# Patient Record
Sex: Male | Born: 1935
Health system: Southern US, Community
[De-identification: ages and names within clinical notes are randomized; demographics above are authoritative.]

## PROBLEM LIST (undated history)

## (undated) DIAGNOSIS — N400 Enlarged prostate without lower urinary tract symptoms: Secondary | ICD-10-CM

## (undated) DIAGNOSIS — I10 Essential (primary) hypertension: Secondary | ICD-10-CM

## (undated) DIAGNOSIS — Z974 Presence of external hearing-aid: Secondary | ICD-10-CM

## (undated) DIAGNOSIS — R011 Cardiac murmur, unspecified: Secondary | ICD-10-CM

## (undated) DIAGNOSIS — Z8719 Personal history of other diseases of the digestive system: Secondary | ICD-10-CM

## (undated) DIAGNOSIS — M1710 Unilateral primary osteoarthritis, unspecified knee: Secondary | ICD-10-CM

## (undated) DIAGNOSIS — E119 Type 2 diabetes mellitus without complications: Secondary | ICD-10-CM

## (undated) DIAGNOSIS — G4700 Insomnia, unspecified: Secondary | ICD-10-CM

## (undated) DIAGNOSIS — G4730 Sleep apnea, unspecified: Secondary | ICD-10-CM

## (undated) DIAGNOSIS — I639 Cerebral infarction, unspecified: Secondary | ICD-10-CM

## (undated) DIAGNOSIS — I219 Acute myocardial infarction, unspecified: Secondary | ICD-10-CM

## (undated) DIAGNOSIS — Z85828 Personal history of other malignant neoplasm of skin: Secondary | ICD-10-CM

## (undated) DIAGNOSIS — Z860100 Personal history of colon polyps, unspecified: Secondary | ICD-10-CM

## (undated) DIAGNOSIS — C4490 Unspecified malignant neoplasm of skin, unspecified: Secondary | ICD-10-CM

## (undated) DIAGNOSIS — Z972 Presence of dental prosthetic device (complete) (partial): Secondary | ICD-10-CM

## (undated) DIAGNOSIS — N390 Urinary tract infection, site not specified: Secondary | ICD-10-CM

## (undated) DIAGNOSIS — E785 Hyperlipidemia, unspecified: Secondary | ICD-10-CM

## (undated) DIAGNOSIS — L719 Rosacea, unspecified: Secondary | ICD-10-CM

## (undated) DIAGNOSIS — G459 Transient cerebral ischemic attack, unspecified: Secondary | ICD-10-CM

## (undated) DIAGNOSIS — F0390 Unspecified dementia without behavioral disturbance: Secondary | ICD-10-CM

## (undated) DIAGNOSIS — K219 Gastro-esophageal reflux disease without esophagitis: Secondary | ICD-10-CM

## (undated) DIAGNOSIS — Z8679 Personal history of other diseases of the circulatory system: Secondary | ICD-10-CM

## (undated) DIAGNOSIS — C3490 Malignant neoplasm of unspecified part of unspecified bronchus or lung: Secondary | ICD-10-CM

## (undated) DIAGNOSIS — I2510 Atherosclerotic heart disease of native coronary artery without angina pectoris: Secondary | ICD-10-CM

## (undated) HISTORY — DX: Unspecified malignant neoplasm of skin, unspecified: C44.90

## (undated) HISTORY — DX: Unilateral primary osteoarthritis, unspecified knee: M17.10

## (undated) HISTORY — DX: Malignant neoplasm of unspecified part of unspecified bronchus or lung: C34.90

## (undated) HISTORY — DX: Benign prostatic hyperplasia without lower urinary tract symptoms: N40.0

## (undated) HISTORY — DX: Personal history of other diseases of the circulatory system: Z86.79

## (undated) HISTORY — DX: Rosacea, unspecified: L71.9

## (undated) HISTORY — PX: EYE SURGERY: SHX253

## (undated) HISTORY — DX: Essential (primary) hypertension: I10

## (undated) HISTORY — DX: Urinary tract infection, site not specified: N39.0

## (undated) HISTORY — DX: Transient cerebral ischemic attack, unspecified: G45.9

## (undated) HISTORY — DX: Insomnia, unspecified: G47.00

## (undated) HISTORY — DX: Personal history of colon polyps, unspecified: Z86.0100

## (undated) HISTORY — DX: Acute myocardial infarction, unspecified: I21.9

## (undated) HISTORY — DX: Personal history of colonic polyps: Z86.010

## (undated) HISTORY — DX: Cerebral infarction, unspecified: I63.9

## (undated) HISTORY — DX: Type 2 diabetes mellitus without complications: E11.9

## (undated) HISTORY — DX: Hyperlipidemia, unspecified: E78.5

## (undated) HISTORY — PX: MOHS SURGERY: SUR867

## (undated) HISTORY — DX: Atherosclerotic heart disease of native coronary artery without angina pectoris: I25.10

## (undated) HISTORY — DX: Personal history of other malignant neoplasm of skin: Z85.828

---

## 1968-11-12 HISTORY — PX: VASECTOMY: SHX75

## 2006-08-12 HISTORY — PX: REPLACEMENT TOTAL KNEE: SUR1224

## 2010-08-12 HISTORY — PX: COLONOSCOPY: SHX174

## 2012-02-28 DIAGNOSIS — L719 Rosacea, unspecified: Secondary | ICD-10-CM | POA: Diagnosis not present

## 2012-02-28 DIAGNOSIS — L570 Actinic keratosis: Secondary | ICD-10-CM | POA: Diagnosis not present

## 2012-03-28 DIAGNOSIS — E119 Type 2 diabetes mellitus without complications: Secondary | ICD-10-CM | POA: Diagnosis not present

## 2012-03-28 DIAGNOSIS — I10 Essential (primary) hypertension: Secondary | ICD-10-CM | POA: Diagnosis not present

## 2012-03-28 DIAGNOSIS — E7800 Pure hypercholesterolemia, unspecified: Secondary | ICD-10-CM | POA: Diagnosis not present

## 2012-04-01 DIAGNOSIS — E7800 Pure hypercholesterolemia, unspecified: Secondary | ICD-10-CM | POA: Diagnosis not present

## 2012-04-01 DIAGNOSIS — E119 Type 2 diabetes mellitus without complications: Secondary | ICD-10-CM | POA: Diagnosis not present

## 2012-07-10 DIAGNOSIS — L820 Inflamed seborrheic keratosis: Secondary | ICD-10-CM | POA: Diagnosis not present

## 2012-07-10 DIAGNOSIS — L719 Rosacea, unspecified: Secondary | ICD-10-CM | POA: Diagnosis not present

## 2012-07-10 DIAGNOSIS — L570 Actinic keratosis: Secondary | ICD-10-CM | POA: Diagnosis not present

## 2012-07-10 DIAGNOSIS — L578 Other skin changes due to chronic exposure to nonionizing radiation: Secondary | ICD-10-CM | POA: Diagnosis not present

## 2012-08-21 DIAGNOSIS — L570 Actinic keratosis: Secondary | ICD-10-CM | POA: Diagnosis not present

## 2012-09-09 DIAGNOSIS — Z23 Encounter for immunization: Secondary | ICD-10-CM | POA: Diagnosis not present

## 2012-09-10 DIAGNOSIS — L570 Actinic keratosis: Secondary | ICD-10-CM | POA: Diagnosis not present

## 2012-09-26 DIAGNOSIS — E119 Type 2 diabetes mellitus without complications: Secondary | ICD-10-CM | POA: Diagnosis not present

## 2012-09-26 DIAGNOSIS — E7800 Pure hypercholesterolemia, unspecified: Secondary | ICD-10-CM | POA: Diagnosis not present

## 2012-09-26 DIAGNOSIS — I10 Essential (primary) hypertension: Secondary | ICD-10-CM | POA: Diagnosis not present

## 2012-09-26 DIAGNOSIS — H9190 Unspecified hearing loss, unspecified ear: Secondary | ICD-10-CM | POA: Diagnosis not present

## 2012-09-26 DIAGNOSIS — N401 Enlarged prostate with lower urinary tract symptoms: Secondary | ICD-10-CM | POA: Insufficient documentation

## 2012-09-29 DIAGNOSIS — E119 Type 2 diabetes mellitus without complications: Secondary | ICD-10-CM | POA: Diagnosis not present

## 2012-10-23 DIAGNOSIS — G4700 Insomnia, unspecified: Secondary | ICD-10-CM | POA: Diagnosis not present

## 2012-10-23 DIAGNOSIS — N401 Enlarged prostate with lower urinary tract symptoms: Secondary | ICD-10-CM | POA: Diagnosis not present

## 2012-10-23 DIAGNOSIS — I10 Essential (primary) hypertension: Secondary | ICD-10-CM | POA: Diagnosis not present

## 2012-10-23 DIAGNOSIS — E7800 Pure hypercholesterolemia, unspecified: Secondary | ICD-10-CM | POA: Diagnosis not present

## 2012-11-25 DIAGNOSIS — I10 Essential (primary) hypertension: Secondary | ICD-10-CM | POA: Diagnosis not present

## 2012-11-25 DIAGNOSIS — G4700 Insomnia, unspecified: Secondary | ICD-10-CM | POA: Diagnosis not present

## 2012-11-25 DIAGNOSIS — N401 Enlarged prostate with lower urinary tract symptoms: Secondary | ICD-10-CM | POA: Diagnosis not present

## 2012-11-25 DIAGNOSIS — E7800 Pure hypercholesterolemia, unspecified: Secondary | ICD-10-CM | POA: Diagnosis not present

## 2012-12-16 DIAGNOSIS — H6120 Impacted cerumen, unspecified ear: Secondary | ICD-10-CM | POA: Diagnosis not present

## 2012-12-16 DIAGNOSIS — H903 Sensorineural hearing loss, bilateral: Secondary | ICD-10-CM | POA: Diagnosis not present

## 2013-01-12 DIAGNOSIS — L578 Other skin changes due to chronic exposure to nonionizing radiation: Secondary | ICD-10-CM | POA: Diagnosis not present

## 2013-01-12 DIAGNOSIS — L570 Actinic keratosis: Secondary | ICD-10-CM | POA: Diagnosis not present

## 2013-02-20 DIAGNOSIS — I10 Essential (primary) hypertension: Secondary | ICD-10-CM | POA: Diagnosis not present

## 2013-02-20 DIAGNOSIS — E119 Type 2 diabetes mellitus without complications: Secondary | ICD-10-CM | POA: Diagnosis not present

## 2013-02-24 DIAGNOSIS — G4700 Insomnia, unspecified: Secondary | ICD-10-CM | POA: Diagnosis not present

## 2013-02-24 DIAGNOSIS — I10 Essential (primary) hypertension: Secondary | ICD-10-CM | POA: Diagnosis not present

## 2013-02-24 DIAGNOSIS — E119 Type 2 diabetes mellitus without complications: Secondary | ICD-10-CM | POA: Diagnosis not present

## 2013-02-24 DIAGNOSIS — E7800 Pure hypercholesterolemia, unspecified: Secondary | ICD-10-CM | POA: Diagnosis not present

## 2013-02-26 DIAGNOSIS — M79609 Pain in unspecified limb: Secondary | ICD-10-CM | POA: Diagnosis not present

## 2013-05-18 DIAGNOSIS — H2510 Age-related nuclear cataract, unspecified eye: Secondary | ICD-10-CM | POA: Diagnosis not present

## 2013-05-22 DIAGNOSIS — E119 Type 2 diabetes mellitus without complications: Secondary | ICD-10-CM | POA: Diagnosis not present

## 2013-05-22 DIAGNOSIS — I10 Essential (primary) hypertension: Secondary | ICD-10-CM | POA: Diagnosis not present

## 2013-05-22 LAB — COMPLETE METABOLIC PANEL WITH GFR
AST: 25 U/L
Creat: 1.14
Glucose: 94
Sodium: 139 mmol/L (ref 137–147)

## 2013-05-29 DIAGNOSIS — M62838 Other muscle spasm: Secondary | ICD-10-CM | POA: Diagnosis not present

## 2013-05-29 DIAGNOSIS — I10 Essential (primary) hypertension: Secondary | ICD-10-CM | POA: Diagnosis not present

## 2013-05-29 DIAGNOSIS — E119 Type 2 diabetes mellitus without complications: Secondary | ICD-10-CM | POA: Diagnosis not present

## 2013-05-29 DIAGNOSIS — G4700 Insomnia, unspecified: Secondary | ICD-10-CM | POA: Diagnosis not present

## 2013-07-07 ENCOUNTER — Ambulatory Visit (INDEPENDENT_AMBULATORY_CARE_PROVIDER_SITE_OTHER): Payer: Medicare Other | Admitting: Family Medicine

## 2013-07-07 ENCOUNTER — Encounter: Payer: Self-pay | Admitting: Family Medicine

## 2013-07-07 VITALS — BP 126/80 | HR 76 | Temp 97.7°F | Ht 66.75 in | Wt 182.0 lb

## 2013-07-07 DIAGNOSIS — L719 Rosacea, unspecified: Secondary | ICD-10-CM | POA: Insufficient documentation

## 2013-07-07 DIAGNOSIS — Z86010 Personal history of colonic polyps: Secondary | ICD-10-CM | POA: Insufficient documentation

## 2013-07-07 DIAGNOSIS — N400 Enlarged prostate without lower urinary tract symptoms: Secondary | ICD-10-CM | POA: Diagnosis not present

## 2013-07-07 DIAGNOSIS — E119 Type 2 diabetes mellitus without complications: Secondary | ICD-10-CM | POA: Diagnosis not present

## 2013-07-07 DIAGNOSIS — G4700 Insomnia, unspecified: Secondary | ICD-10-CM | POA: Diagnosis not present

## 2013-07-07 MED ORDER — DOXEPIN HCL 3 MG PO TABS: 3.0000 mg | ORAL_TABLET | Freq: Every evening | ORAL | Status: DC | PRN

## 2013-07-07 NOTE — Patient Instructions (Signed)
Continue medicnies as up to now. Trial of silenor for sleep.  Let me know how this does. Return in 3-4 months fasting for blood work and afterwards for [employer] visit.

## 2013-07-07 NOTE — Assessment & Plan Note (Signed)
Continue meds. 

## 2013-07-07 NOTE — Assessment & Plan Note (Signed)
Good control as of last A1c. will continue to monitor. Foot exam today. Per pt UTD eye exam. Continue metformin.

## 2013-07-07 NOTE — Assessment & Plan Note (Signed)
Last colonoscopy 2011.  Pt states was told due for f/u in 5 yrs.  Will discuss merits of colonoscopy at that time.

## 2013-07-07 NOTE — Assessment & Plan Note (Signed)
Stable off meds. Continue to monitor. 

## 2013-07-07 NOTE — Progress Notes (Signed)
Subjective:    Patient ID: Stephen [name], male    DOB: [birth year], 79 y.o.   MRN: [id number]  HPI CC: new pt to establish  Prior PCP Dr. Delfin Edis.  She moved to [place] in [place].  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  Insomnia - longstanding.  No daytime somnolence.  Prior used lunesta which helped him fall asleep.  Has tried trazodone (didn't help) and ambien (hangover effect), benadryl or melatonin.  Has bedtime routine.  Reading at night.  No TV or radio in room.  Takes nap at noon.  Interested in trial of silenor  DM - eye exam 05/2013 at [facility].  Foot exam today.  Doesn't regularly check sugars.    Lives with friend - Ardith Dark RN. 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  Past histories reviewed and updated if relevant as below. There are no active problems to display for this patient.  Past Medical History  Diagnosis Date  . History of basal cell cancer     s/p mohs  . Insomnia     treated with multiple meds in past  . Diabetes type 2, controlled   . BPH (benign prostatic hypertrophy)   . Rosacea   . History of hypertension   . Arthritis of knee   . History of colon polyps    Past Surgical History  Procedure Laterality Date  . Colonoscopy  08/2010    hyperplastic polyp, rec rpt 5 yrs  . Mohs surgery      basal cell chin/back  . Replacement total knee Left 08/2006  . Vasectomy  1970   History  Substance Use Topics  . Smoking status: Former Smoker    Quit date: 11/12/1962  . Smokeless tobacco: Never Used     Comment: Quit 1964  . Alcohol Use: Yes     Comment: 1 beer/day   Family History  Problem Relation Age of  Onset  . Cancer Father 54    prostate and colon  . CAD Brother 36    MI, smoker  . Parkinson's disease Brother   . COPD Brother   . CAD Brother     MI  . Cancer Mother     ovarian or uterine  . Stroke Sister    Allergies  Allergen Reactions  . Penicillins Hives   No current outpatient prescriptions on file prior to visit.   No current facility-administered medications on file prior to visit.    Review of Systems  Constitutional: Negative for fever, chills, activity change, appetite change, fatigue and unexpected weight change.  HENT: Negative for hearing loss and neck pain.   Eyes: Negative for visual disturbance.  Respiratory: Negative for cough, chest tightness, shortness of breath and wheezing.   Cardiovascular: Negative for chest pain, palpitations and leg swelling.  Gastrointestinal: Negative for nausea, vomiting, abdominal pain, diarrhea, constipation, blood in stool and abdominal distention.  Genitourinary: Negative for hematuria and difficulty urinating.  Musculoskeletal: Negative for myalgias and arthralgias.  Skin: Negative for rash.  Neurological: Negative for dizziness, seizures, syncope and headaches.  Hematological: Negative for adenopathy. Does not bruise/bleed easily.  Psychiatric/Behavioral: Negative for dysphoric mood. The patient is not nervous/anxious.  Objective:   Physical Exam  Nursing note and vitals reviewed. Constitutional: He is oriented to person, place, and time. He appears well-developed and well-nourished. No distress.  HENT:  Head: Normocephalic and atraumatic.  Right Ear: External ear normal.  Left Ear: External ear normal.  Nose: Nose normal.  Mouth/Throat: Oropharynx is clear and moist. No oropharyngeal exudate.  Hearing aides bilaterally  Eyes: Conjunctivae and EOM are normal. Pupils are equal, round, and reactive to light. No scleral icterus.  Neck: Normal range of motion. Neck supple. Carotid bruit is not present.   Cardiovascular: Normal rate, regular rhythm, normal heart sounds and intact distal pulses.   No murmur heard. Pulses:      Radial pulses are 2+ on the right side, and 2+ on the left side.  Pulmonary/Chest: Effort normal and breath sounds normal. No respiratory distress. He has no wheezes. He has no rales.  Musculoskeletal: Normal range of motion. He exhibits no edema.  Diabetic foot exam: Normal inspection No skin breakdown No calluses  Normal DP/PT pulses Normal sensation to light touch and monofilament Nails normal  Lymphadenopathy:    He has no cervical adenopathy.  Neurological: He is alert and oriented to person, place, and time.  CN grossly intact, station and gait intact  Skin: Skin is warm and dry. No rash noted.  Psychiatric: He has a normal mood and affect. His behavior is normal. Judgment and thought content normal.       Assessment & Plan:

## 2013-07-07 NOTE — Assessment & Plan Note (Signed)
Has tried several meds (See HPI) - ineffective. Discussed silenor - will do trial of this. Also discussed sleep hygiene measures - pt does have bedtime routine.

## 2013-07-08 ENCOUNTER — Encounter: Payer: Self-pay | Admitting: Family Medicine

## 2013-07-16 ENCOUNTER — Encounter: Payer: Self-pay | Admitting: Family Medicine

## 2013-08-14 DIAGNOSIS — L719 Rosacea, unspecified: Secondary | ICD-10-CM | POA: Diagnosis not present

## 2013-08-14 DIAGNOSIS — Z85828 Personal history of other malignant neoplasm of skin: Secondary | ICD-10-CM | POA: Diagnosis not present

## 2013-08-14 DIAGNOSIS — L570 Actinic keratosis: Secondary | ICD-10-CM | POA: Diagnosis not present

## 2013-09-10 DIAGNOSIS — Z23 Encounter for immunization: Secondary | ICD-10-CM | POA: Diagnosis not present

## 2013-09-14 ENCOUNTER — Ambulatory Visit (INDEPENDENT_AMBULATORY_CARE_PROVIDER_SITE_OTHER): Payer: Medicare Other | Admitting: Family Medicine

## 2013-09-14 ENCOUNTER — Encounter: Payer: Self-pay | Admitting: Family Medicine

## 2013-09-14 VITALS — BP 126/78 | HR 88 | Temp 98.1°F | Wt 189.8 lb

## 2013-09-14 DIAGNOSIS — G4700 Insomnia, unspecified: Secondary | ICD-10-CM | POA: Diagnosis not present

## 2013-09-14 MED ORDER — ZOLPIDEM TARTRATE 5 MG PO TABS: 5.0000 mg | ORAL_TABLET | Freq: Every evening | ORAL | Status: DC | PRN

## 2013-09-14 MED ORDER — METFORMIN HCL 500 MG PO TABS: 500.0000 mg | ORAL_TABLET | Freq: Two times a day (BID) | ORAL | Status: DC

## 2013-09-14 MED ORDER — DOXAZOSIN MESYLATE 1 MG PO TABS: 1.0000 mg | ORAL_TABLET | Freq: Every day | ORAL | Status: DC

## 2013-09-14 NOTE — Patient Instructions (Signed)
Let's do trial of lower dose ambien - if bad side effect the next morning, may even cut in 1/2.  See below for discussion on sleep hygiene.  Insomnia Insomnia is frequent trouble falling and/or staying asleep. Insomnia can be a long term problem or a short term problem. Both are common. Insomnia can be a short term problem when the wakefulness is related to a certain stress or worry. Long term insomnia is often related to ongoing stress during waking hours and/or poor sleeping habits. Overtime, sleep deprivation itself can make the problem worse. Every little thing feels more severe because you are overtired and your ability to cope is decreased. CAUSES   Stress, anxiety, and depression.  Poor sleeping habits.  Distractions such as TV in the bedroom.  Naps close to bedtime.  Engaging in emotionally charged conversations before bed.  Technical reading before sleep.  Alcohol and other sedatives. They may make the problem worse. They can hurt normal sleep patterns and normal dream activity.  Stimulants such as caffeine for several hours prior to bedtime.  Pain syndromes and shortness of breath can cause insomnia.  Exercise late at night.  Changing time zones may cause sleeping problems (jet lag). It is sometimes helpful to have someone observe your sleeping patterns. They should look for periods of not breathing during the night (sleep apnea). They should also look to see how long those periods last. If you live alone or observers are uncertain, you can also be observed at a sleep clinic where your sleep patterns will be professionally monitored. Sleep apnea requires a checkup and treatment. Give your caregivers your medical history. Give your caregivers observations your family has made about your sleep.  SYMPTOMS   Not feeling rested in the morning.  Anxiety and restlessness at bedtime.  Difficulty falling and staying asleep. TREATMENT   Your caregiver may prescribe treatment for  an underlying medical disorders. Your caregiver can give advice or help if you are using alcohol or other drugs for self-medication. Treatment of underlying problems will usually eliminate insomnia problems.  Medications can be prescribed for short time use. They are generally not recommended for lengthy use.  Over-the-counter sleep medicines are not recommended for lengthy use. They can be habit forming.  You can promote easier sleeping by making lifestyle changes such as:  Using relaxation techniques that help with breathing and reduce muscle tension.  Exercising earlier in the day.  Changing your diet and the time of your last meal. No night time snacks.  Establish a regular time to go to bed.  Counseling can help with stressful problems and worry.  Soothing music and white noise may be helpful if there are background noises you cannot remove.  Stop tedious detailed work at least one hour before bedtime. HOME CARE INSTRUCTIONS   Keep a diary. Inform your caregiver about your progress. This includes any medication side effects. See your caregiver regularly. Take note of:  Times when you are asleep.  Times when you are awake during the night.  The quality of your sleep.  How you feel the next day. This information will help your caregiver care for you.  Get out of bed if you are still awake after 15 minutes. Read or do some quiet activity. Keep the lights down. Wait until you feel sleepy and go back to bed.  Keep regular sleeping and waking hours. Avoid naps.  Exercise regularly.  Avoid distractions at bedtime. Distractions include watching television or engaging in any intense or  detailed activity like attempting to balance the household checkbook.  Develop a bedtime ritual. Keep a familiar routine of bathing, brushing your teeth, climbing into bed at the same time each night, listening to soothing music. Routines increase the success of falling to sleep faster.  Use  relaxation techniques. This can be using breathing and muscle tension release routines. It can also include visualizing peaceful scenes. You can also help control troubling or intruding thoughts by keeping your mind occupied with boring or repetitive thoughts like the old concept of counting sheep. You can make it more creative like imagining planting one beautiful flower after another in your backyard garden.  During your day, work to eliminate stress. When this is not possible use some of the previous suggestions to help reduce the anxiety that accompanies stressful situations. MAKE SURE YOU:   Understand these instructions.  Will watch your condition.  Will get help right away if you are not doing well or get worse. Document Released: 10/26/2000 Document Revised: 01/21/2012 Document Reviewed: 11/26/2007 [facility] Patient Information 2014 [place], [place].

## 2013-09-14 NOTE — Assessment & Plan Note (Signed)
Has tried and failed multiple sleeping aides including over the counter meds  Will treat with ambien - low dose to try and minimize undesirable side effects. Discussed risks of non benzo hypnotics like ambien but insomnia is very botheresome and affecting quality of life.

## 2013-09-14 NOTE — Progress Notes (Signed)
  Subjective:    Patient ID: CORIE VAVRA, male    DOB: 29-Apr-1927, 79 y.o.   MRN: [id number]  HPI CC: insomnia  Insomnia - longstanding. No daytime somnolence. Has bedtime routine. Reading at night. No TV or radio in room. Takes nap at noon. Has calm, quiet dark environment to sleep.  Wakes up 2x/night to use bathroom.  Takes him about 1 hour to fall asleep.  Prior used lunesta which helped him fall asleep. Has tried trazodone (didn't help) and ambien (hangover effect), benadryl or melatonin. Last visit prescribed silenor at 3mg  - took 1 month trial but not effective.  Only thing that helped was [place], then [place].  Always needs something to help him sleep.  Past Medical History  Diagnosis Date  . History of basal cell cancer     s/p mohs  . Insomnia     treated with multiple meds in past  . Diabetes type 2, controlled   . BPH (benign prostatic hypertrophy)     w/ nocturia  . Rosacea   . Arthritis of knee   . History of colon polyps   . HLD (hyperlipidemia)     diet controlled in past  . History of hypertension      Review of Systems Per HPI    Objective:   Physical Exam WDWN elderly CM    Assessment & Plan:

## 2013-11-12 DIAGNOSIS — I219 Acute myocardial infarction, unspecified: Secondary | ICD-10-CM

## 2013-11-12 HISTORY — DX: Acute myocardial infarction, unspecified: I21.9

## 2013-11-14 ENCOUNTER — Other Ambulatory Visit: Payer: Self-pay | Admitting: Family Medicine

## 2013-11-14 DIAGNOSIS — E119 Type 2 diabetes mellitus without complications: Secondary | ICD-10-CM

## 2013-11-20 ENCOUNTER — Other Ambulatory Visit (INDEPENDENT_AMBULATORY_CARE_PROVIDER_SITE_OTHER): Payer: Medicare Other

## 2013-11-20 DIAGNOSIS — E119 Type 2 diabetes mellitus without complications: Secondary | ICD-10-CM

## 2013-11-20 LAB — LIPID PANEL
CHOLESTEROL: 198 mg/dL (ref 0–200)
HDL: 50.8 mg/dL (ref >39.00)
LDL CALC: 109 mg/dL — AB (ref 0–99)
Total CHOL/HDL Ratio: 4
Triglycerides: 189 mg/dL — ABNORMAL HIGH (ref 0.0–149.0)
VLDL: 37.8 mg/dL (ref 0.0–40.0)

## 2013-11-20 LAB — BASIC METABOLIC PANEL
BUN: 15 mg/dL (ref 6–23)
CHLORIDE: 105 meq/L (ref 96–112)
CO2: 30 mEq/L (ref 19–32)
Calcium: 9.5 mg/dL (ref 8.4–10.5)
Creatinine, Ser: 1.2 mg/dL (ref 0.4–1.5)
GFR: 59.01 mL/min — AB (ref >60.00)
GLUCOSE: 111 mg/dL — AB (ref 70–99)
POTASSIUM: 5.1 meq/L (ref 3.5–5.1)
SODIUM: 140 meq/L (ref 135–145)

## 2013-11-20 LAB — MICROALBUMIN / CREATININE URINE RATIO
Creatinine,U: 105.7 mg/dL
MICROALB UR: 0.9 mg/dL (ref 0.0–1.9)
Microalb Creat Ratio: 0.9 mg/g (ref 0.0–30.0)

## 2013-11-20 LAB — HEMOGLOBIN A1C: HEMOGLOBIN A1C: 6.3 % (ref 4.6–6.5)

## 2013-11-25 ENCOUNTER — Encounter: Payer: Federal, State, Local not specified - PPO | Admitting: Family Medicine

## 2013-11-27 ENCOUNTER — Ambulatory Visit (INDEPENDENT_AMBULATORY_CARE_PROVIDER_SITE_OTHER): Payer: Medicare Other | Admitting: Family Medicine

## 2013-11-27 ENCOUNTER — Encounter: Payer: Self-pay | Admitting: Family Medicine

## 2013-11-27 VITALS — BP 122/78 | HR 80 | Temp 97.6°F | Ht 66.75 in | Wt 188.5 lb

## 2013-11-27 DIAGNOSIS — E119 Type 2 diabetes mellitus without complications: Secondary | ICD-10-CM

## 2013-11-27 DIAGNOSIS — E785 Hyperlipidemia, unspecified: Secondary | ICD-10-CM

## 2013-11-27 DIAGNOSIS — G4700 Insomnia, unspecified: Secondary | ICD-10-CM

## 2013-11-27 DIAGNOSIS — Z0000 Encounter for general adult medical examination without abnormal findings: Secondary | ICD-10-CM | POA: Diagnosis not present

## 2013-11-27 DIAGNOSIS — Z86010 Personal history of colonic polyps: Secondary | ICD-10-CM

## 2013-11-27 MED ORDER — ESZOPICLONE 1 MG PO TABS: 1.0000 mg | ORAL_TABLET | Freq: Every evening | ORAL | Status: DC | PRN

## 2013-11-27 NOTE — Assessment & Plan Note (Signed)
Reviewed goal LDL <100 with DM hx. Will start OTC RYR 600mg  daily.  Discussed watching out for myalgias.

## 2013-11-27 NOTE — Patient Instructions (Addendum)
Try lunesta trial for 1 month. If no effect after a few days, try increase to 2 pills nightly. For cholesterol - try red yeast rice (over the counter supplement to help cholesterol levels) Good to see you today, call us with questions Return as needed or in 1 year for next wellness exam.

## 2013-11-27 NOTE — Progress Notes (Signed)
Pre-visit discussion using our clinic review tool. No additional management support is needed unless otherwise documented below in the visit note.  

## 2013-11-27 NOTE — Assessment & Plan Note (Signed)
See prior notes for details.  Did not notice effect with 5mg  ambien.  Will start with trial of lunesta at 1mg , but prior was prescribed 2mg  with some effect. Pt aware of risks/benefits of non benzo hypnotics.

## 2013-11-27 NOTE — Addendum Note (Signed)
Addended by: Ria Bush on: 11/27/2013 09:07 AM   Modules accepted: Level of Service

## 2013-11-27 NOTE — Assessment & Plan Note (Signed)
I have personally reviewed the Medicare Annual Wellness questionnaire and have noted 1. The patient's medical and social history 2. Their use of alcohol, tobacco or illicit drugs 3. Their current medications and supplements 4. The patient's functional ability including ADL's, fall risks, home safety risks and hearing or visual impairment. 5. Diet and physical activity 6. Evidence for depression or mood disorders The patients weight, height, BMI have been recorded in the chart.  Hearing and vision has been addressed. I have made referrals, counseling and provided education to the patient based review of the above and I have provided the pt with a written personalized care plan for preventive services. See scanned questionairre.  Reviewed preventative protocols and updated unless pt declined.

## 2013-11-27 NOTE — Assessment & Plan Note (Signed)
Hyperplastic.  Likely does not need rpt - will discuss next year.

## 2013-11-27 NOTE — Assessment & Plan Note (Signed)
Chronic, stable. Continue med. 

## 2013-11-27 NOTE — Progress Notes (Signed)
Subjective:    Patient ID: Stephen [name], male    DOB: [birth year], 80 y.o.   MRN: [id number]  HPI CC: medicare wellness  Continued trouble with insomnia. Restless sleeping.  No daytime naps.  No daytime sleepiness.  No significant caffeine use during the day.  Lorrin Mais is not really helping - see prior notes for details.  May be interested in lunesta trial again as this helped in the past.  Denies adverse events when on these meds in the past.  DM - regularly does not check sugars.  Compliant with antihyperglycemic regimen which includes: metformin 500mg  twice daily.  Denies low sugars or hypoglycemic symptoms.  Denies paresthesias. Last diabetic eye exam 05/2013.  Pneumovax: 2012.     Lives with friend - Osvaldo Angst RN.  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  Deferred hearing screen 2/2 hearing aide use. No falls or anhedonia, depression, sadness.  Preventative:  Colonoscopy done 2011 would be due for rpt 2016 - will discuss next year [facility]) Prostate - aged out Flu 08/2013 Td 05/2008  Pneumvax 2012 zostavax 05/2008   Medications and allergies reviewed and updated in chart.  Past histories reviewed and updated if relevant as below. Patient Active Problem List   Diagnosis Date Noted  . Insomnia   . Diabetes type 2, controlled   . BPH (benign prostatic hypertrophy)   . Rosacea   . History of colon polyps    Past Medical History  Diagnosis Date  . History of basal cell cancer     s/p mohs  . Insomnia     treated with multiple meds in past  . Diabetes type 2, controlled   . BPH (benign prostatic hypertrophy)     w/ nocturia  . Rosacea   . Arthritis of knee   . History of colon polyps   . HLD (hyperlipidemia)     diet controlled in past  . History of hypertension    Past Surgical  History  Procedure Laterality Date  . Colonoscopy  08/2010    hyperplastic polyp, rec rpt 5 yrs  . Mohs surgery      basal cell chin/back  . Replacement total knee Left 08/2006  . Vasectomy  1970   History  Substance Use Topics  . Smoking status: Former Smoker    Quit date: 11/12/1962  . Smokeless tobacco: Never Used     Comment: Quit 1964  . Alcohol Use: Yes     Comment: 1 beer/day   Family History  Problem Relation Age of Onset  . Cancer Father 39    prostate and colon  . CAD Brother 38    MI, smoker  . Parkinson's disease Brother   . COPD Brother   . CAD Brother     MI  . Cancer Mother     ovarian or uterine  . Stroke Sister    Allergies  Allergen Reactions  . Penicillins Hives   Current Outpatient Prescriptions on File Prior to Visit  Medication Sig Dispense Refill  . Clindamycin Phos-Benzoyl Perox (ACANYA) gel Apply topically once a day      . doxazosin (CARDURA) 1 MG tablet Take 1 tablet (1 mg total) by mouth at bedtime.  90 tablet  3  . metFORMIN (GLUCOPHAGE) 500 MG tablet Take 1 tablet (500 mg  total) by mouth 2 (two) times daily with a meal.  180 tablet  3  . metroNIDAZOLE (METROGEL) 1 % gel Apply topically daily.      . Multiple Vitamin (MULTIVITAMIN) tablet Take 1 tablet by mouth daily.      . Psyllium (METAMUCIL PO) Take by mouth every morning      . zolpidem (AMBIEN) 5 MG tablet Take 1 tablet (5 mg total) by mouth at bedtime as needed for sleep.  30 tablet  1   No current facility-administered medications on file prior to visit.     Review of Systems  Constitutional: Negative for fever, chills, activity change, appetite change, fatigue and unexpected weight change.  HENT: Negative for hearing loss.   Eyes: Negative for visual disturbance.  Respiratory: Negative for cough, chest tightness, shortness of breath and wheezing.   Cardiovascular: Negative for chest pain, palpitations and leg swelling.  Gastrointestinal: Negative for nausea, vomiting,  abdominal pain, diarrhea, constipation, blood in stool and abdominal distention.  Genitourinary: Negative for hematuria and difficulty urinating.  Musculoskeletal: Negative for arthralgias, myalgias and neck pain.  Skin: Negative for rash.  Neurological: Negative for dizziness, seizures, syncope and headaches.  Hematological: Negative for adenopathy. Does not bruise/bleed easily.  Psychiatric/Behavioral: Negative for dysphoric mood. The patient is not nervous/anxious.        Objective:   Physical Exam  Nursing note and vitals reviewed. Constitutional: He is oriented to person, place, and time. He appears well-developed and well-nourished. No distress.  HENT:  Head: Normocephalic and atraumatic.  Right Ear: External ear normal.  Left Ear: External ear normal.  Nose: Nose normal.  Mouth/Throat: Uvula is midline, oropharynx is clear and moist and mucous membranes are normal. No oropharyngeal exudate, posterior oropharyngeal edema, posterior oropharyngeal erythema or tonsillar abscesses.  Hearing aides in place  Eyes: Conjunctivae and EOM are normal. Pupils are equal, round, and reactive to light. No scleral icterus.  Neck: Normal range of motion. Neck supple. Carotid bruit is not present.  Cardiovascular: Normal rate, regular rhythm, normal heart sounds and intact distal pulses.   No murmur heard. Pulses:      Radial pulses are 2+ on the right side, and 2+ on the left side.  Pulmonary/Chest: Effort normal and breath sounds normal. No respiratory distress. He has no wheezes. He has no rales.  Abdominal: Soft. Bowel sounds are normal. He exhibits no distension and no mass. There is no tenderness. There is no rebound and no guarding.  Musculoskeletal: Normal range of motion. He exhibits no edema.  Lymphadenopathy:    He has no cervical adenopathy.  Neurological: He is alert and oriented to person, place, and time.  CN grossly intact, station and gait intact  Skin: Skin is warm and dry. No  rash noted.  Psychiatric: He has a normal mood and affect. His behavior is normal. Judgment and thought content normal.       Assessment & Plan:

## 2013-11-30 ENCOUNTER — Telehealth: Payer: Self-pay

## 2013-11-30 NOTE — Telephone Encounter (Signed)
Relevant patient education assigned to patient using Emmi. ° °

## 2013-12-28 ENCOUNTER — Other Ambulatory Visit: Payer: Self-pay | Admitting: Family Medicine

## 2013-12-28 NOTE — Telephone Encounter (Signed)
Ok to refill 

## 2013-12-30 NOTE — Telephone Encounter (Signed)
Rx called in as directed.   

## 2013-12-30 NOTE — Telephone Encounter (Signed)
plz phone in. 

## 2014-01-26 ENCOUNTER — Other Ambulatory Visit: Payer: Self-pay | Admitting: Family Medicine

## 2014-01-26 NOTE — Telephone Encounter (Signed)
plz phne in.

## 2014-01-26 NOTE — Telephone Encounter (Signed)
Rx called in as directed.   

## 2014-02-19 DIAGNOSIS — L719 Rosacea, unspecified: Secondary | ICD-10-CM | POA: Diagnosis not present

## 2014-02-19 DIAGNOSIS — L570 Actinic keratosis: Secondary | ICD-10-CM | POA: Diagnosis not present

## 2014-02-23 ENCOUNTER — Encounter: Payer: Self-pay | Admitting: Family Medicine

## 2014-02-23 ENCOUNTER — Ambulatory Visit (INDEPENDENT_AMBULATORY_CARE_PROVIDER_SITE_OTHER): Payer: Medicare Other | Admitting: Family Medicine

## 2014-02-23 VITALS — BP 128/68 | HR 76 | Temp 97.5°F | Wt 192.8 lb

## 2014-02-23 DIAGNOSIS — N400 Enlarged prostate without lower urinary tract symptoms: Secondary | ICD-10-CM | POA: Diagnosis not present

## 2014-02-23 DIAGNOSIS — G4700 Insomnia, unspecified: Secondary | ICD-10-CM

## 2014-02-23 DIAGNOSIS — S335XXA Sprain of ligaments of lumbar spine, initial encounter: Secondary | ICD-10-CM

## 2014-02-23 DIAGNOSIS — S39012A Strain of muscle, fascia and tendon of lower back, initial encounter: Secondary | ICD-10-CM | POA: Insufficient documentation

## 2014-02-23 MED ORDER — ZOLPIDEM TARTRATE ER 6.25 MG PO TBCR: 6.2500 mg | EXTENDED_RELEASE_TABLET | Freq: Every evening | ORAL | Status: DC | PRN

## 2014-02-23 NOTE — Patient Instructions (Addendum)
I think you have lumbar strain - should get better with time.  Continue cyclobenzaprine nightly up to twice daily if needed.  Pass by Marion's office to scheule physical therapy referral. Let's try working on night time urination to improve insomnia.  Try finasteride pill once daily along with cardura.  Trial of this for next 1-2 months to see if any improvement in night time awakenings. May try ambien extended release instead of lunesta - but don't mix with flexeril (cyclobenzaprine). Good to see you today, update me with effect of above after 1-2 months, sooner if needed.

## 2014-02-23 NOTE — Progress Notes (Signed)
BP 128/68  Pulse 76  Temp(Src) 97.5 F (36.4 C) (Oral)  Wt 192 lb 12 oz (87.431 kg)   CC: insomnia, back pain  Subjective:    Patient ID: Stephen [name], male    DOB: [birth year], 80 y.o.   MRN: [id number]  HPI: Stephen [name] is a 80 y.o. male presenting on 02/23/2014 for Back Pain and Insomnia   Recent trip to [place] - lots of airplane rides and bus rides - great trip.  initially some pedal edema which has since improved.  Also also noticed persistent R sided lower back pain since he returned.   Denies fevers/chills, radiculopathy numbness or weakness down legs, bowel/bladder accidents.  Denies inciting trauma/injury or falls.  No h/o back issues in past.  Has been back for last 2 weeks. Has been taking flexeril for back and for sleep which helps.  Finds hot shower also helps lower back.  Continued longstanding trouble with insomnia - sleep maintenance. Restless sleeping. No daytime naps. No daytime sleepiness. No significant caffeine use during the day. Lorrin Mais is not really helping - see prior notes for details. Last visit we restarted lunesta as this helped in the past. Denies adverse events when on these nonbenzo hypnotics in the past.  Johnnye Sima has lost effectiveness.    Has bedtime routine. Reading at night. No TV or radio in room. Takes nap at noon. Has calm, quiet dark environment to sleep. Wakes up 2x/night to use bathroom - on cardura for BPH. Takes him about 1 hour to fall asleep.  Notes increased nocturia over last few months.  Has tried trazodone (didn't help) and ambien (hangover effect), benadryl and melatonin. Has tried silenor at 3mg  - took 1 month trial but not effective.  Relevant past medical, surgical, family and social history reviewed and updated as indicated.  Allergies and medications reviewed and updated. Current Outpatient Prescriptions on File Prior to Visit  Medication Sig  . Clindamycin Phos-Benzoyl Perox (ACANYA) gel Apply topically once a day  .  doxazosin (CARDURA) 1 MG tablet Take 1 tablet (1 mg total) by mouth at bedtime.  . metFORMIN (GLUCOPHAGE) 500 MG tablet Take 1 tablet (500 mg total) by mouth 2 (two) times daily with a meal.  . metroNIDAZOLE (METROGEL) 1 % gel Apply topically daily.  . Multiple Vitamin (MULTIVITAMIN) tablet Take 1 tablet by mouth daily.  . Psyllium (METAMUCIL PO) Take by mouth every morning  . Red Yeast Rice 600 MG CAPS Take 1 capsule by mouth daily.   No current facility-administered medications on file prior to visit.    Review of Systems Per HPI unless specifically indicated above    Objective:    BP 128/68  Pulse 76  Temp(Src) 97.5 F (36.4 C) (Oral)  Wt 192 lb 12 oz (87.431 kg)  Physical Exam  Nursing note and vitals reviewed. Constitutional: He appears well-developed and well-nourished. No distress.  Musculoskeletal: He exhibits no edema.  No pain midline spine + tender/tightness paraspinous mm tenderness at lumbar right side Neg SLR bilaterally. No pain with int/ext rotation at hip. Slight swelling superficial medial leg vein on left, mild tenderness       Assessment & Plan:   Problem List Items Addressed This Visit   Insomnia     Trial ambien CR given main concern is sleep maintenance insomnia. Will also work on BPH as possible cause of insomnia.    BPH (benign prostatic hypertrophy)     Possibly contributing to insomnia in form of nocturia causing  awakenings. Will continue cardura, start finasteride.  Discussed mechanism of action.    Lumbar strain - Primary     No red flags - anticipate strain. Treat with continued flexeril 5mg  which pt tolerates well and refer to PT at [facility]. Pt agrees with plan.    Relevant Orders      Ambulatory referral to Physical Therapy       Follow up plan: Return if symptoms worsen or fail to improve.

## 2014-02-23 NOTE — Assessment & Plan Note (Signed)
Trial ambien CR given main concern is sleep maintenance insomnia. Will also work on BPH as possible cause of insomnia.

## 2014-02-23 NOTE — Assessment & Plan Note (Signed)
Possibly contributing to insomnia in form of nocturia causing awakenings. Will continue cardura, start finasteride.  Discussed mechanism of action.

## 2014-02-23 NOTE — Progress Notes (Signed)
Pre visit review using our clinic review tool, if applicable. No additional management support is needed unless otherwise documented below in the visit note. 

## 2014-02-23 NOTE — Assessment & Plan Note (Signed)
No red flags - anticipate strain. Treat with continued flexeril 5mg  which pt tolerates well and refer to PT at [facility]. Pt agrees with plan.

## 2014-02-24 ENCOUNTER — Telehealth: Payer: Self-pay

## 2014-02-24 MED ORDER — FINASTERIDE 5 MG PO TABS: 5.0000 mg | ORAL_TABLET | Freq: Every day | ORAL | Status: DC

## 2014-02-24 NOTE — Telephone Encounter (Signed)
plz notify this was sent in. 

## 2014-02-24 NOTE — Telephone Encounter (Signed)
Message left advising patient.  

## 2014-02-24 NOTE — Telephone Encounter (Signed)
Pt was seen on 02/23/14 and was to start Finasteride. Pt said med not at pharmacy. Pt request Finasteride sent to [facility]. Pt request cb when sent to pharmacy.

## 2014-02-26 DIAGNOSIS — S335XXA Sprain of ligaments of lumbar spine, initial encounter: Secondary | ICD-10-CM | POA: Diagnosis not present

## 2014-02-26 DIAGNOSIS — M6281 Muscle weakness (generalized): Secondary | ICD-10-CM | POA: Diagnosis not present

## 2014-03-02 ENCOUNTER — Telehealth: Payer: Self-pay | Admitting: *Deleted

## 2014-03-02 DIAGNOSIS — M6281 Muscle weakness (generalized): Secondary | ICD-10-CM | POA: Diagnosis not present

## 2014-03-02 DIAGNOSIS — S335XXA Sprain of ligaments of lumbar spine, initial encounter: Secondary | ICD-10-CM | POA: Diagnosis not present

## 2014-03-02 NOTE — Telephone Encounter (Signed)
Signed in Dr. Synthia Innocent absence.

## 2014-03-02 NOTE — Telephone Encounter (Signed)
Paperwork faxed to [facility].

## 2014-03-02 NOTE — Telephone Encounter (Signed)
PT orders for patient in your IN box for signature. If they can be stamped with his stamp, I'll take care of it. He ordered PT at patient's last OV, but i wasn't sure if the forms could be stamped or not. Just review and let me know. Please return to me. Thanks!

## 2014-03-04 DIAGNOSIS — S335XXA Sprain of ligaments of lumbar spine, initial encounter: Secondary | ICD-10-CM | POA: Diagnosis not present

## 2014-03-04 DIAGNOSIS — M6281 Muscle weakness (generalized): Secondary | ICD-10-CM | POA: Diagnosis not present

## 2014-03-05 DIAGNOSIS — S335XXA Sprain of ligaments of lumbar spine, initial encounter: Secondary | ICD-10-CM | POA: Diagnosis not present

## 2014-03-05 DIAGNOSIS — M6281 Muscle weakness (generalized): Secondary | ICD-10-CM | POA: Diagnosis not present

## 2014-03-08 DIAGNOSIS — S335XXA Sprain of ligaments of lumbar spine, initial encounter: Secondary | ICD-10-CM | POA: Diagnosis not present

## 2014-03-08 DIAGNOSIS — M6281 Muscle weakness (generalized): Secondary | ICD-10-CM | POA: Diagnosis not present

## 2014-03-09 DIAGNOSIS — M6281 Muscle weakness (generalized): Secondary | ICD-10-CM | POA: Diagnosis not present

## 2014-03-09 DIAGNOSIS — S335XXA Sprain of ligaments of lumbar spine, initial encounter: Secondary | ICD-10-CM | POA: Diagnosis not present

## 2014-03-10 DIAGNOSIS — S335XXA Sprain of ligaments of lumbar spine, initial encounter: Secondary | ICD-10-CM | POA: Diagnosis not present

## 2014-03-10 DIAGNOSIS — M6281 Muscle weakness (generalized): Secondary | ICD-10-CM | POA: Diagnosis not present

## 2014-03-12 DIAGNOSIS — I2510 Atherosclerotic heart disease of native coronary artery without angina pectoris: Secondary | ICD-10-CM | POA: Insufficient documentation

## 2014-03-12 HISTORY — PX: CARDIAC CATHETERIZATION: SHX172

## 2014-03-12 HISTORY — DX: Atherosclerotic heart disease of native coronary artery without angina pectoris: I25.10

## 2014-03-18 ENCOUNTER — Encounter: Payer: Self-pay | Admitting: Family Medicine

## 2014-03-22 ENCOUNTER — Other Ambulatory Visit: Payer: Self-pay | Admitting: Family Medicine

## 2014-03-22 NOTE — Telephone Encounter (Signed)
plz phone in. 

## 2014-03-22 NOTE — Telephone Encounter (Signed)
Rx called in as directed.   

## 2014-03-23 ENCOUNTER — Emergency Department: Payer: Self-pay | Admitting: Emergency Medicine

## 2014-03-23 DIAGNOSIS — I2119 ST elevation (STEMI) myocardial infarction involving other coronary artery of inferior wall: Secondary | ICD-10-CM | POA: Insufficient documentation

## 2014-03-23 DIAGNOSIS — Z87891 Personal history of nicotine dependence: Secondary | ICD-10-CM | POA: Diagnosis not present

## 2014-03-23 DIAGNOSIS — I2582 Chronic total occlusion of coronary artery: Secondary | ICD-10-CM | POA: Diagnosis not present

## 2014-03-23 DIAGNOSIS — Z880 Allergy status to penicillin: Secondary | ICD-10-CM | POA: Diagnosis not present

## 2014-03-23 DIAGNOSIS — E119 Type 2 diabetes mellitus without complications: Secondary | ICD-10-CM | POA: Diagnosis present

## 2014-03-23 DIAGNOSIS — N401 Enlarged prostate with lower urinary tract symptoms: Secondary | ICD-10-CM | POA: Diagnosis present

## 2014-03-23 DIAGNOSIS — R338 Other retention of urine: Secondary | ICD-10-CM | POA: Diagnosis present

## 2014-03-23 DIAGNOSIS — E7800 Pure hypercholesterolemia, unspecified: Secondary | ICD-10-CM | POA: Diagnosis present

## 2014-03-23 DIAGNOSIS — Z85828 Personal history of other malignant neoplasm of skin: Secondary | ICD-10-CM | POA: Diagnosis not present

## 2014-03-23 DIAGNOSIS — I10 Essential (primary) hypertension: Secondary | ICD-10-CM | POA: Diagnosis not present

## 2014-03-23 DIAGNOSIS — G4700 Insomnia, unspecified: Secondary | ICD-10-CM | POA: Diagnosis present

## 2014-03-23 DIAGNOSIS — I2510 Atherosclerotic heart disease of native coronary artery without angina pectoris: Secondary | ICD-10-CM | POA: Diagnosis not present

## 2014-03-23 DIAGNOSIS — R351 Nocturia: Secondary | ICD-10-CM | POA: Diagnosis present

## 2014-03-23 DIAGNOSIS — I219 Acute myocardial infarction, unspecified: Secondary | ICD-10-CM | POA: Diagnosis not present

## 2014-03-23 LAB — CBC WITH DIFFERENTIAL/PLATELET
BASOS PCT: 0.4 %
Basophil #: 0 10*3/uL (ref 0.0–0.1)
Eosinophil #: 0 10*3/uL (ref 0.0–0.7)
Eosinophil %: 0.2 %
HCT: 47.3 % (ref 40.0–52.0)
HGB: 15.5 g/dL (ref 13.0–18.0)
Lymphocyte #: 1.2 10*3/uL (ref 1.0–3.6)
Lymphocyte %: 10.1 %
MCH: 32.6 pg (ref 26.0–34.0)
MCHC: 32.8 g/dL (ref 32.0–36.0)
MCV: 99 fL (ref 80–100)
MONOS PCT: 4.5 %
Monocyte #: 0.6 x10 3/mm (ref 0.2–1.0)
Neutrophil #: 10.3 10*3/uL — ABNORMAL HIGH (ref 1.4–6.5)
Neutrophil %: 84.8 %
Platelet: 261 10*3/uL (ref 150–440)
RBC: 4.76 10*6/uL (ref 4.40–5.90)
RDW: 12.8 % (ref 11.5–14.5)
WBC: 12.1 10*3/uL — AB (ref 3.8–10.6)

## 2014-03-23 LAB — APTT: Activated PTT: 27.7 secs (ref 23.6–35.9)

## 2014-03-23 LAB — COMPREHENSIVE METABOLIC PANEL
ALBUMIN: 4.1 g/dL (ref 3.4–5.0)
AST: 45 U/L — AB (ref 15–37)
Alkaline Phosphatase: 79 U/L
Anion Gap: 4 — ABNORMAL LOW (ref 7–16)
BILIRUBIN TOTAL: 0.4 mg/dL (ref 0.2–1.0)
BUN: 22 mg/dL — AB (ref 7–18)
CHLORIDE: 105 mmol/L (ref 98–107)
CREATININE: 1.3 mg/dL (ref 0.60–1.30)
Calcium, Total: 9.2 mg/dL (ref 8.5–10.1)
Co2: 29 mmol/L (ref 21–32)
EGFR (Non-African Amer.): 50 — ABNORMAL LOW
GFR CALC AF AMER: 58 — AB
Glucose: 128 mg/dL — ABNORMAL HIGH (ref 65–99)
OSMOLALITY: 281 (ref 275–301)
Potassium: 4.3 mmol/L (ref 3.5–5.1)
SGPT (ALT): 27 U/L (ref 12–78)
Sodium: 138 mmol/L (ref 136–145)
Total Protein: 7.6 g/dL (ref 6.4–8.2)

## 2014-03-23 LAB — PROTIME-INR
INR: 1
PROTHROMBIN TIME: 12.7 s (ref 11.5–14.7)

## 2014-03-23 LAB — MAGNESIUM: Magnesium: 2.2 mg/dL

## 2014-03-23 LAB — CK TOTAL AND CKMB (NOT AT ARMC)
CK, Total: 212 U/L
CK-MB: 10.5 ng/mL — ABNORMAL HIGH (ref 0.5–3.6)

## 2014-03-23 LAB — LIPASE, BLOOD: Lipase: 114 U/L (ref 73–393)

## 2014-03-23 LAB — TROPONIN I: Troponin-I: 0.59 ng/mL — ABNORMAL HIGH

## 2014-03-29 ENCOUNTER — Telehealth: Payer: Self-pay | Admitting: Family Medicine

## 2014-03-29 NOTE — Telephone Encounter (Signed)
received D/C summary from [facility] for admission 5/12-15/2015 for STEMI of inferionrw all.  Can we call for transitional care phone call and schedule hosp f/u visit in 1-2 wks?  Thanks.  Actually looks like pt already has appt scheduled for May 21st.

## 2014-03-30 ENCOUNTER — Telehealth: Payer: Self-pay | Admitting: Family Medicine

## 2014-03-30 NOTE — Telephone Encounter (Signed)
Attempted transitional care call.  The person who answered the phone identified herself as pt's significant other and said that I could speak to her regarding pt.  When I asked her to wait a moment while I checked to see if she was on pt's DPR, she told me she was too busy and hung up the phone.

## 2014-03-30 NOTE — Telephone Encounter (Signed)
See phone note

## 2014-03-31 ENCOUNTER — Ambulatory Visit (INDEPENDENT_AMBULATORY_CARE_PROVIDER_SITE_OTHER): Payer: Medicare Other | Admitting: Family Medicine

## 2014-03-31 ENCOUNTER — Encounter: Payer: Self-pay | Admitting: Family Medicine

## 2014-03-31 VITALS — BP 112/78 | HR 72 | Temp 97.4°F | Wt 185.5 lb

## 2014-03-31 DIAGNOSIS — N400 Enlarged prostate without lower urinary tract symptoms: Secondary | ICD-10-CM

## 2014-03-31 DIAGNOSIS — R300 Dysuria: Secondary | ICD-10-CM

## 2014-03-31 DIAGNOSIS — N390 Urinary tract infection, site not specified: Secondary | ICD-10-CM | POA: Insufficient documentation

## 2014-03-31 DIAGNOSIS — I2510 Atherosclerotic heart disease of native coronary artery without angina pectoris: Secondary | ICD-10-CM

## 2014-03-31 DIAGNOSIS — E785 Hyperlipidemia, unspecified: Secondary | ICD-10-CM | POA: Diagnosis not present

## 2014-03-31 DIAGNOSIS — K5900 Constipation, unspecified: Secondary | ICD-10-CM

## 2014-03-31 LAB — POCT URINALYSIS DIPSTICK
BILIRUBIN UA: NEGATIVE
Glucose, UA: NEGATIVE
KETONES UA: NEGATIVE
Nitrite, UA: NEGATIVE
PROTEIN UA: NEGATIVE
Spec Grav, UA: <=1.005
Urobilinogen, UA: 0.2
pH, UA: 6.5

## 2014-03-31 MED ORDER — CIPROFLOXACIN HCL 250 MG PO TABS: 250.0000 mg | ORAL_TABLET | Freq: Two times a day (BID) | ORAL | Status: DC

## 2014-03-31 NOTE — Assessment & Plan Note (Signed)
Was on cardura - now on finasteride.  Anticipate sxs endorsed today due to UTI and not obstructive BPH. Will continue finasteride and see how he does after he completes UTI treatment. Hold cardura for now.

## 2014-03-31 NOTE — Progress Notes (Signed)
BP 112/78  Pulse 72  Temp(Src) 97.4 F (36.3 C) (Oral)  Wt 185 lb 8 oz (84.142 kg)   CC: hosp f/u  Subjective:    Patient ID: Stephen [name], male    DOB: Feb 01, 1927, 80 y.o.   MRN: [id number]  HPI: Stephen [name] is a 80 y.o. male presenting on 03/31/2014 for Follow-up and Urinary Tract Infection   Stephen [name] presents today with significant other for follow up of recent hospitalization at [facility] from 5/12-15/2015 where he was found to have inferior wall STEMI with 100% occlusion of RCA s/p DES.  Initial EKG demonstrated ST elevations in II, III, aVF, with TnI 0.59, discharge troponin 4.76.  Due to low blood pressures, was unable to be started on antihypertensives and this was asked to be done outpatient.  He did have some urinary retention due to h/o BPH, but resolved once finasteride was restarted.  Doxazosin was held 2/2 low bp (down to 04U systolic per partner).  Started on lipitor 80mg  daily, aspirin and plavix.  Discharge creatinine: 1.1, Hgb 11.8.  Rec establish with cards and consider cardiac rehab.  He has appointment with Dr. Fletcher Anon for later today.  rec aspirin 81mg  daily alnog with plavix 75mg  daily for at least 1 year.  Since he's been home, main concern is urinary symptoms.  + urgency but without complete emptying.  Some dysuria.  This urinary problem causes significant anxiety.  Increase in nocturia noted.  No fevers.  No abd pain, flank pain or back pain. bp log he brings - 110-120/70s over last week.  Also noticing constipation since he's been home.  Echo: Mild LV dysfunction (EF 45%) with mild LVH Mild RV systolic dysfunction Valvular regurgitation: trivial TR  F/u phone call: attempted 03/30/2014, caregiver hung up.  Relevant past medical, surgical, family and social history reviewed and updated as indicated.  Allergies and medications reviewed and updated. Current Outpatient Prescriptions on File Prior to Visit  Medication Sig  . Clindamycin Phos-Benzoyl Perox  (ACANYA) gel Apply topically once a day  . finasteride (PROSCAR) 5 MG tablet Take 1 tablet (5 mg total) by mouth daily.  . metFORMIN (GLUCOPHAGE) 500 MG tablet Take 1 tablet (500 mg total) by mouth 2 (two) times daily with a meal.  . metroNIDAZOLE (METROGEL) 1 % gel Apply topically daily.  . Multiple Vitamin (MULTIVITAMIN) tablet Take 1 tablet by mouth daily.  . Psyllium (METAMUCIL PO) Take by mouth every morning  . zolpidem (AMBIEN CR) 6.25 MG CR tablet TAKE 1 TABLET BY MOUTH AT BEDTIME AS NEEDED  . doxazosin (CARDURA) 1 MG tablet Take 1 tablet (1 mg total) by mouth at bedtime.   No current facility-administered medications on file prior to visit.    Review of Systems Per HPI unless specifically indicated above    Objective:    BP 112/78  Pulse 72  Temp(Src) 97.4 F (36.3 C) (Oral)  Wt 185 lb 8 oz (84.142 kg)  Physical Exam  Nursing note and vitals reviewed. Constitutional: He appears well-developed and well-nourished. No distress.  HENT:  Mouth/Throat: Oropharynx is clear and moist. No oropharyngeal exudate.  Cardiovascular: Normal rate, regular rhythm, normal heart sounds and intact distal pulses.   Pulmonary/Chest: Effort normal and breath sounds normal. No respiratory distress. He has no wheezes. He has no rales.  Musculoskeletal: He exhibits no edema.  Ecchymosis R groin with mild induration at site of catheterization  Skin: Skin is warm and dry. No rash noted.  Psychiatric: He has a normal  mood and affect.   Results for orders placed in visit on 03/31/14  POCT URINALYSIS DIPSTICK      Result Value Ref Range   Color, UA Straw     Clarity, UA Hazy     Glucose, UA Negative     Bilirubin, UA Negative     Ketones, UA Negative     Spec Grav, UA <=1.005     Blood, UA Moderate     pH, UA 6.5     Protein, UA Negative     Urobilinogen, UA 0.2     Nitrite, UA Negative     Leukocytes, UA moderate (2+)        Assessment & Plan:   Problem List Items Addressed This Visit     BPH (benign prostatic hypertrophy)     Was on cardura - now on finasteride.  Anticipate sxs endorsed today due to UTI and not obstructive BPH. Will continue finasteride and see how he does after he completes UTI treatment. Hold cardura for now.    HLD (hyperlipidemia)     Has been started on liptor 80mg  daily by hospital.  Pt endorsing some leg cramping. rec start CoQ10.  If not effective, will likely need decreased dose of statin.    Relevant Medications      aspirin EC 81 MG tablet      atorvastatin (LIPITOR) 80 MG tablet      nitroGLYCERIN (NITROSTAT) 0.4 MG SL tablet   UTI (urinary tract infection) - Primary      UA/micro consistent with UTI - treat with cipro twice daily for 7 days. Lab Results  Component Value Date   CREATININE 1.2 11/20/2013      Relevant Orders      Urine culture   CAD (coronary artery disease)     New s/p STEMI and DES to RCA.  On lipitor and aspirin and plavix. Did not start ACEI/Bblocker today. Will await cardiology evaluation.    Relevant Medications      aspirin EC 81 MG tablet      atorvastatin (LIPITOR) 80 MG tablet      nitroGLYCERIN (NITROSTAT) 0.4 MG SL tablet   Constipation     Treat with colace and miralax, increased water intake.     Other Visit Diagnoses   Dysuria        Relevant Orders       POCT Urinalysis Dipstick (Completed)        Follow up plan: Return if symptoms worsen or fail to improve.

## 2014-03-31 NOTE — Assessment & Plan Note (Signed)
New s/p STEMI and DES to RCA.  On lipitor and aspirin and plavix. Did not start ACEI/Bblocker today. Will await cardiology evaluation.

## 2014-03-31 NOTE — Assessment & Plan Note (Signed)
Treat with colace and miralax, increased water intake.

## 2014-03-31 NOTE — Assessment & Plan Note (Signed)
UA/micro consistent with UTI - treat with cipro twice daily for 7 days. Lab Results  Component Value Date   CREATININE 1.2 11/20/2013

## 2014-03-31 NOTE — Patient Instructions (Addendum)
We have checked urine today - possible infection.  Treat with ciprofloxacin 250mg  twice daily for 7 days.  I have sent culture.   Try coQ10 1 capsule daily (over the counter supplement for muscle aches) while you're on cholesterol medicine.  If muscle cramps become severe let me know. For constipation - start colace 100mg  daily and may try miralax 17gm capful in 8 oz water once daily as needed. Return to see me in 1 month for follow up.  Urinary Tract Infection Urinary tract infections (UTIs) can develop anywhere along your urinary tract. Your urinary tract is your body's drainage system for removing wastes and extra water. Your urinary tract includes two kidneys, two ureters, a bladder, and a urethra. Your kidneys are a pair of bean-shaped organs. Each kidney is about the size of your fist. They are located below your ribs, one on each side of your spine. CAUSES Infections are caused by microbes, which are microscopic organisms, including fungi, viruses, and bacteria. These organisms are so small that they can only be seen through a microscope. Bacteria are the microbes that most commonly cause UTIs. SYMPTOMS  Symptoms of UTIs may vary by age and gender of the patient and by the location of the infection. Symptoms in young women typically include a frequent and intense urge to urinate and a painful, burning feeling in the bladder or urethra during urination. Older women and men are more likely to be tired, shaky, and weak and have muscle aches and abdominal pain. A fever may mean the infection is in your kidneys. Other symptoms of a kidney infection include pain in your back or sides below the ribs, nausea, and vomiting. DIAGNOSIS To diagnose a UTI, your caregiver will ask you about your symptoms. Your caregiver also will ask to provide a urine sample. The urine sample will be tested for bacteria and white blood cells. White blood cells are made by your body to help fight infection. TREATMENT   Typically, UTIs can be treated with medication. Because most UTIs are caused by a bacterial infection, they usually can be treated with the use of antibiotics. The choice of antibiotic and length of treatment depend on your symptoms and the type of bacteria causing your infection. HOME CARE INSTRUCTIONS  If you were prescribed antibiotics, take them exactly as your caregiver instructs you. Finish the medication even if you feel better after you have only taken some of the medication.  Drink enough water and fluids to keep your urine clear or pale yellow.  Avoid caffeine, tea, and carbonated beverages. They tend to irritate your bladder.  Empty your bladder often. Avoid holding urine for long periods of time.  Empty your bladder before and after sexual intercourse.  After a bowel movement, women should cleanse from front to back. Use each tissue only once. SEEK MEDICAL CARE IF:   You have back pain.  You develop a fever.  Your symptoms do not begin to resolve within 3 days. SEEK IMMEDIATE MEDICAL CARE IF:   You have severe back pain or lower abdominal pain.  You develop chills.  You have nausea or vomiting.  You have continued burning or discomfort with urination. MAKE SURE YOU:   Understand these instructions.  Will watch your condition.  Will get help right away if you are not doing well or get worse. Document Released: 08/08/2005 Document Revised: 04/29/2012 Document Reviewed: 12/07/2011 [facility] Patient Information 2014 [place].

## 2014-03-31 NOTE — Progress Notes (Signed)
Pre visit review using our clinic review tool, if applicable. No additional management support is needed unless otherwise documented below in the visit note. 

## 2014-03-31 NOTE — Assessment & Plan Note (Signed)
Has been started on liptor 80mg  daily by hospital.  Pt endorsing some leg cramping. rec start CoQ10.  If not effective, will likely need decreased dose of statin.

## 2014-04-01 ENCOUNTER — Ambulatory Visit: Payer: Medicare Other | Admitting: Family Medicine

## 2014-04-01 ENCOUNTER — Ambulatory Visit (INDEPENDENT_AMBULATORY_CARE_PROVIDER_SITE_OTHER): Payer: Medicare Other | Admitting: Cardiovascular Disease

## 2014-04-01 ENCOUNTER — Encounter: Payer: Self-pay | Admitting: Family Medicine

## 2014-04-01 VITALS — BP 128/82 | HR 67 | Ht 68.0 in | Wt 184.2 lb

## 2014-04-01 DIAGNOSIS — E785 Hyperlipidemia, unspecified: Secondary | ICD-10-CM | POA: Diagnosis not present

## 2014-04-01 DIAGNOSIS — I2510 Atherosclerotic heart disease of native coronary artery without angina pectoris: Secondary | ICD-10-CM | POA: Diagnosis not present

## 2014-04-01 MED ORDER — ATORVASTATIN CALCIUM 40 MG PO TABS: 40.0000 mg | ORAL_TABLET | Freq: Every day | ORAL | Status: DC

## 2014-04-01 MED ORDER — METOPROLOL SUCCINATE ER 25 MG PO TB24: 25.0000 mg | ORAL_TABLET | Freq: Every day | ORAL | Status: DC

## 2014-04-01 NOTE — Patient Instructions (Signed)
Your physician has recommended you make the following change in your medication:  Start Toprol 25 mg daily  Decrease Atorvastatin to 40 mg daily   Your physician recommends that you schedule a follow-up appointment in:  3 months   Your physician recommends that you return for lab work in:  1 month  Fasting labs   You have been referred to cardiac rehab. Please call me of you do not here from them within 1 week. Elmyra Ricks [phone]

## 2014-04-01 NOTE — Progress Notes (Signed)
Primary care physician: Dr. Danise Mina  HPI  This is a pleasant 80 year old man who is here today for followup visit after recent hospitalization at [facility] for inferior ST elevation myocardial infarction. He presented on May 12 2 [facility] with acute onset of chest pain and was found to have inferior ST elevation on his EKG. He underwent emergent cardiac catheterization which showed an occluded mid RCA. He had successful angioplasty and drug-eluting stent placement. Ejection fraction was 45%. He has chronic medical conditions that include type 2 diabetes, hypertension and hyperlipidemia. He is a vegetarian and lives at [facility]. He has been doing well and denies any chest pain or shortness of breath. He reports mild myalgia with atorvastatin. He has been taking all his other medications regularly. Blood pressure was low during hospitalization and thus he was not started on beta blocker or an ACE inhibitor.  Allergies  Allergen Reactions  . Penicillins Hives  . Doxycycline Rash     Current Outpatient Prescriptions on File Prior to Visit  Medication Sig Dispense Refill  . aspirin EC 81 MG tablet Take 81 mg by mouth daily.      . ciprofloxacin (CIPRO) 250 MG tablet Take 1 tablet (250 mg total) by mouth 2 (two) times daily.  14 tablet  0  . Clindamycin Phos-Benzoyl Perox (ACANYA) gel Apply topically once a day      . clopidogrel (PLAVIX) 75 MG tablet Take 75 mg by mouth daily with breakfast.      . Coenzyme Q10 (CO Q-10) 100 MG CAPS Take 1 capsule by mouth daily.      Marland Kitchen docusate sodium (COLACE) 100 MG capsule Take 100 mg by mouth 2 (two) times daily.      . finasteride (PROSCAR) 5 MG tablet Take 1 tablet (5 mg total) by mouth daily.  30 tablet  6  . metFORMIN (GLUCOPHAGE) 500 MG tablet Take 1 tablet (500 mg total) by mouth 2 (two) times daily with a meal.  180 tablet  3  . metroNIDAZOLE (METROGEL) 1 % gel Apply topically daily.      . Multiple Vitamin (MULTIVITAMIN) tablet  Take 1 tablet by mouth daily.      . nitroGLYCERIN (NITROSTAT) 0.4 MG SL tablet Place 0.4 mg under the tongue every 5 (five) minutes as needed for chest pain.      . polyethylene glycol (MIRALAX / GLYCOLAX) packet Take 17 g by mouth daily as needed for moderate constipation.      . Psyllium (METAMUCIL PO) Take by mouth every morning      . zolpidem (AMBIEN CR) 6.25 MG CR tablet TAKE 1 TABLET BY MOUTH AT BEDTIME AS NEEDED  30 tablet  0  . doxazosin (CARDURA) 1 MG tablet Take 1 tablet (1 mg total) by mouth at bedtime.  90 tablet  3   No current facility-administered medications on file prior to visit.     Past Medical History  Diagnosis Date  . History of basal cell cancer     s/p mohs  . Insomnia     treated with multiple meds in past  . Diabetes type 2, controlled   . BPH (benign prostatic hypertrophy)     w/ nocturia  . Rosacea   . Arthritis of knee   . History of colon polyps   . History of hypertension   . MI (myocardial infarction)   . Skin cancer   . Hypertension   . UTI (urinary tract infection)   . Coronary  artery disease 03/2014    Inferior ST elevation myocardial infarction. Cardiac catheterization showed an occluded mid RCA. He had an angioplasty and drug-eluting stent placement with a 3.0 x 16 mm Promus drug-eluting stent. Ejection fraction was 45% by echo.  Marland Kitchen HLD (hyperlipidemia)     diet controlled in past     Past Surgical History  Procedure Laterality Date  . Colonoscopy  08/2010    hyperplastic polyp, rec rpt 5 yrs  . Mohs surgery      basal cell chin/back  . Replacement total knee Left 08/2006  . Vasectomy  1970  . Cardiac catheterization  03/2014    [facility];x1 stent     Family History  Problem Relation Age of Onset  . Cancer Father 41    prostate and colon  . CAD Brother 22    MI, smoker  . Parkinson's disease Brother   . COPD Brother   . CAD Brother     MI  . Cancer Mother     ovarian or uterine  . Stroke Sister      History   Social  History  . Marital Status: Single    Spouse Name: N/A    Number of Children: N/A  . Years of Education: N/A   Occupational History  . Not on file.   Social History Main Topics  . Smoking status: Former Smoker    Quit date: 11/12/1962  . Smokeless tobacco: Never Used     Comment: Quit 1964  . Alcohol Use: Yes     Comment: 1 beer/day  . Drug Use: No  . Sexual Activity: Not on file   Other Topics Concern  . Not on file   Social History Narrative   Lives with friend - Osvaldo Angst RN.   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 It is negative other than that mentioned in the history of present illness.   PHYSICAL EXAM   BP 128/82  Pulse 67  Ht 5\' 8"  (1.727 m)  Wt 184 lb 4 oz (83.575 kg)  BMI 28.02 kg/m2 Constitutional: He is oriented to person, place, and time. He appears well-developed and well-nourished. No distress.  HENT: No nasal discharge.  Head: Normocephalic and atraumatic.  Eyes: Pupils are equal and round.  No discharge. Neck: Normal range of motion. Neck supple. No JVD present. No thyromegaly present.  Cardiovascular: Normal rate, regular rhythm, normal heart sounds. Exam reveals no gallop and no friction rub. No murmur heard.  Pulmonary/Chest: Effort normal and breath sounds normal. No stridor. No respiratory distress. He has no wheezes. He has no rales. He exhibits no tenderness.  Abdominal: Soft. Bowel sounds are normal. He exhibits no distension. There is no tenderness. There is no rebound and no guarding.  Musculoskeletal: Normal range of motion. He exhibits no edema and no tenderness.  Neurological: He is alert and oriented to person, place, and time. Coordination normal.  Skin:  Skin is warm and dry. No rash noted. He is not diaphoretic. No erythema. No pallor.  Psychiatric: He has a normal mood and affect. His behavior is normal. Judgment and thought content normal.       EKG:  Normal sinus rhythm with old inferior infarct.   ASSESSMENT AND PLAN

## 2014-04-01 NOTE — Assessment & Plan Note (Signed)
I decreased the dose of atorvastatin to 40 mg once daily due to mild myalgia. Check fasting lipid and liver profile in one month.

## 2014-04-01 NOTE — Assessment & Plan Note (Signed)
He is doing very well with no symptoms suggestive of recurrent angina. Continue dual antiplatelet therapy for at least one year. I referred him to cardiac rehabilitation at [facility]. Ejection fraction was 45%. Thus, I started Toprol 25 mg once daily. Continue to monitor blood pressure at home. An ACE inhibitor can be considered if blood pressure allows.

## 2014-04-03 ENCOUNTER — Other Ambulatory Visit: Payer: Self-pay | Admitting: Family Medicine

## 2014-04-03 LAB — URINE CULTURE: Colony Count: >=100000

## 2014-04-03 MED ORDER — SULFAMETHOXAZOLE-TMP DS 800-160 MG PO TABS: 1.0000 | ORAL_TABLET | Freq: Two times a day (BID) | ORAL | Status: DC

## 2014-04-06 ENCOUNTER — Telehealth: Payer: Self-pay | Admitting: *Deleted

## 2014-04-06 NOTE — Telephone Encounter (Signed)
Orders and notes sent to cardiac rehab

## 2014-04-08 ENCOUNTER — Telehealth: Payer: Self-pay

## 2014-04-08 ENCOUNTER — Telehealth: Payer: Self-pay | Admitting: *Deleted

## 2014-04-08 NOTE — Telephone Encounter (Signed)
Informed patient orders sent 04/06/14  Instructed patient to call back if he did not hear from them within 1 week  Patient verbalized understanding

## 2014-04-08 NOTE — Telephone Encounter (Signed)
Pt calling and states he has not heard anything regarding Cardiac rehab. Please call.

## 2014-04-08 NOTE — Telephone Encounter (Signed)
Please call patient regarding cardiac rehab.

## 2014-04-15 ENCOUNTER — Encounter: Payer: Self-pay | Admitting: Cardiovascular Disease

## 2014-04-15 DIAGNOSIS — Z5189 Encounter for other specified aftercare: Secondary | ICD-10-CM | POA: Diagnosis not present

## 2014-04-15 DIAGNOSIS — Z9861 Coronary angioplasty status: Secondary | ICD-10-CM | POA: Diagnosis not present

## 2014-04-15 DIAGNOSIS — I252 Old myocardial infarction: Secondary | ICD-10-CM | POA: Diagnosis not present

## 2014-04-22 ENCOUNTER — Other Ambulatory Visit: Payer: Self-pay | Admitting: Family Medicine

## 2014-04-22 DIAGNOSIS — L819 Disorder of pigmentation, unspecified: Secondary | ICD-10-CM | POA: Diagnosis not present

## 2014-04-22 DIAGNOSIS — L570 Actinic keratosis: Secondary | ICD-10-CM | POA: Diagnosis not present

## 2014-04-22 NOTE — Telephone Encounter (Signed)
Plz phone in

## 2014-04-22 NOTE — Telephone Encounter (Signed)
Ok to refill 

## 2014-04-23 NOTE — Telephone Encounter (Signed)
Rx called in as directed.   

## 2014-05-03 ENCOUNTER — Ambulatory Visit (INDEPENDENT_AMBULATORY_CARE_PROVIDER_SITE_OTHER): Payer: Medicare Other | Admitting: Family Medicine

## 2014-05-03 ENCOUNTER — Encounter: Payer: Self-pay | Admitting: Family Medicine

## 2014-05-03 VITALS — BP 110/70 | HR 68 | Temp 97.8°F | Wt 183.5 lb

## 2014-05-03 DIAGNOSIS — N3000 Acute cystitis without hematuria: Secondary | ICD-10-CM

## 2014-05-03 DIAGNOSIS — N400 Enlarged prostate without lower urinary tract symptoms: Secondary | ICD-10-CM

## 2014-05-03 DIAGNOSIS — I2510 Atherosclerotic heart disease of native coronary artery without angina pectoris: Secondary | ICD-10-CM | POA: Diagnosis not present

## 2014-05-03 DIAGNOSIS — N390 Urinary tract infection, site not specified: Secondary | ICD-10-CM | POA: Diagnosis not present

## 2014-05-03 LAB — POCT URINALYSIS DIPSTICK
BILIRUBIN UA: NEGATIVE
GLUCOSE UA: NEGATIVE
Ketones, UA: NEGATIVE
NITRITE UA: NEGATIVE
Spec Grav, UA: 1.01
UROBILINOGEN UA: 0.2
pH, UA: 7

## 2014-05-03 MED ORDER — SULFAMETHOXAZOLE-TRIMETHOPRIM 400-80 MG PO TABS: 1.0000 | ORAL_TABLET | Freq: Two times a day (BID) | ORAL | Status: DC

## 2014-05-03 NOTE — Progress Notes (Signed)
Pre visit review using our clinic review tool, if applicable. No additional management support is needed unless otherwise documented below in the visit note. 

## 2014-05-03 NOTE — Progress Notes (Signed)
BP 110/70  Pulse 68  Temp(Src) 97.8 F (36.6 C) (Oral)  Wt 183 lb 8 oz (83.235 kg)   CC: 1 mo f/u  Subjective:    Patient ID: Stephen [name], male    DOB: [birth year], 80 y.o.   MRN: [id number]  HPI: KOA ZOELLER is a 80 y.o. male presenting on 05/03/2014 for Follow-up   Presents with partner.  Recent inferior wall STEMI with 100% occlusion of RCA s/p DES. Now in cardiac rehab through [facility].  Has f/u with Dr. Fletcher Anon for 2 mo and labwork for tomorrow at cardiology office. Brings log of BP (90-120/50-70s) and HR 60-100.  Recent UTI treated with 7d course cipro but culture returned CONS R cipro, this was changed to 7d course of bactrim. Ho BPH now back on finasteride but not cardura yet. Lab Results  Component Value Date   HGBA1C 6.3 11/20/2013   Wt Readings from Last 3 Encounters:  05/03/14 183 lb 8 oz (83.235 kg)  04/01/14 184 lb 4 oz (83.575 kg)  03/31/14 185 lb 8 oz (84.142 kg)    Continued urinary urgency - with mild early dysuria at beginning of stream - this is also slowly improving. Nocturia x1 (which is his normal). No abd pain, rectal pain/pressure, hematuria, flank pain, nausea.  Slowly improving, but notices weakness in leg muscles. lipitor down to 40mg  daily. Some foot color changes noted by partner, worse when he's been sitting with legs down. Pt denies calf pain or claudication sxs.  Relevant past medical, surgical, family and social history reviewed and updated as indicated.  Allergies and medications reviewed and updated. Current Outpatient Prescriptions on File Prior to Visit  Medication Sig  . aspirin EC 81 MG tablet Take 81 mg by mouth daily.  Marland Kitchen atorvastatin (LIPITOR) 40 MG tablet Take 1 tablet (40 mg total) by mouth daily.  . Clindamycin Phos-Benzoyl Perox (ACANYA) gel Apply topically once a day  . clopidogrel (PLAVIX) 75 MG tablet Take 75 mg by mouth daily with breakfast.  . Coenzyme Q10 (CO Q-10) 100 MG CAPS Take 1 capsule by mouth  daily.  Marland Kitchen docusate sodium (COLACE) 100 MG capsule Take 100 mg by mouth 2 (two) times daily.  . finasteride (PROSCAR) 5 MG tablet Take 1 tablet (5 mg total) by mouth daily.  . metFORMIN (GLUCOPHAGE) 500 MG tablet Take 1 tablet (500 mg total) by mouth 2 (two) times daily with a meal.  . metoprolol succinate (TOPROL-XL) 25 MG 24 hr tablet Take 1 tablet (25 mg total) by mouth daily.  . metroNIDAZOLE (METROGEL) 1 % gel Apply topically daily.  . Multiple Vitamin (MULTIVITAMIN) tablet Take 1 tablet by mouth daily.  . nitroGLYCERIN (NITROSTAT) 0.4 MG SL tablet Place 0.4 mg under the tongue every 5 (five) minutes as needed for chest pain.  . polyethylene glycol (MIRALAX / GLYCOLAX) packet Take 17 g by mouth daily as needed for moderate constipation.  . Psyllium (METAMUCIL PO) Take by mouth every morning  . zolpidem (AMBIEN CR) 6.25 MG CR tablet TAKE 1 TABLET BY MOUTH AT BEDTIME AS NEEDED   No current facility-administered medications on file prior to visit.    Review of Systems Per HPI unless specifically indicated above    Objective:    BP 110/70  Pulse 68  Temp(Src) 97.8 F (36.6 C) (Oral)  Wt 183 lb 8 oz (83.235 kg)  Physical Exam  Nursing note and vitals reviewed. Constitutional: He appears well-developed and well-nourished. No distress.  Cardiovascular: Normal rate,  regular rhythm, normal heart sounds and intact distal pulses.   No murmur heard. Pulmonary/Chest: Effort normal and breath sounds normal. No respiratory distress. He has no wheezes. He has no rales.  Abdominal: Soft. Normal appearance and bowel sounds are normal. He exhibits no distension and no mass. There is no tenderness. There is no rigidity, no rebound, no guarding, no CVA tenderness and negative Murphy's sign.  Musculoskeletal: He exhibits no edema.  2+ DP on left 1++ DP on right   Results for orders placed in visit on 05/03/14  POCT URINALYSIS DIPSTICK      Result Value Ref Range   Color, UA Yellow     Clarity,  UA Hazy     Glucose, UA Negative     Bilirubin, UA Negative     Ketones, UA Negative     Spec Grav, UA 1.010     Blood, UA Large     pH, UA 7.0     Protein, UA Trace     Urobilinogen, UA 0.2     Nitrite, UA Negative     Leukocytes, UA moderate (2+)        Assessment & Plan:   Problem List Items Addressed This Visit   UTI (urinary tract infection)     UA/micro again suspicious for UTI - will send culture and rpt bactrim course. Pt agrees with plan.    Relevant Medications      sulfamethoxazole-trimethoprim (BACTRIM,SEPTRA) 400-80 MG per tablet   CAD (coronary artery disease)     Continue dual antiplatelet for at least 1 year. Continue b blocker. Off ACEI (bp will not currently tolerate this). Continue cardiac rehab.    BPH (benign prostatic hypertrophy)     Continue finasteride, continue to hold cardura for now.     Other Visit Diagnoses   UTI (urinary tract infection), uncomplicated    -  Primary    Relevant Medications       sulfamethoxazole-trimethoprim (BACTRIM,SEPTRA) 400-80 MG per tablet    Other Relevant Orders       POCT Urinalysis Dipstick (Completed)       Urine culture        Follow up plan: Return in about 3 months (around 08/03/2014), or if symptoms worsen or fail to improve, for follow up visit.

## 2014-05-03 NOTE — Patient Instructions (Signed)
I think you have persistent urine infection - treat with another bactrim course. Let us know if not fully improved after this. I have sent a urine culture again today. No other changes today. Good to see you today, call us with questions.

## 2014-05-03 NOTE — Assessment & Plan Note (Signed)
UA/micro again suspicious for UTI - will send culture and rpt bactrim course. Pt agrees with plan.

## 2014-05-03 NOTE — Assessment & Plan Note (Signed)
Continue finasteride, continue to hold cardura for now.

## 2014-05-03 NOTE — Assessment & Plan Note (Addendum)
Continue dual antiplatelet for at least 1 year. Continue b blocker. Off ACEI (bp will not currently tolerate this). Continue cardiac rehab.

## 2014-05-04 ENCOUNTER — Ambulatory Visit (INDEPENDENT_AMBULATORY_CARE_PROVIDER_SITE_OTHER): Payer: Medicare Other

## 2014-05-04 DIAGNOSIS — E785 Hyperlipidemia, unspecified: Secondary | ICD-10-CM | POA: Diagnosis not present

## 2014-05-05 LAB — LIPID PANEL
CHOL/HDL RATIO: 2.3 ratio (ref 0.0–5.0)
Cholesterol, Total: 124 mg/dL (ref 100–199)
HDL: 53 mg/dL (ref >39)
LDL Calculated: 48 mg/dL (ref 0–99)
Triglycerides: 115 mg/dL (ref 0–149)
VLDL Cholesterol Cal: 23 mg/dL (ref 5–40)

## 2014-05-05 LAB — HEPATIC FUNCTION PANEL
ALBUMIN: 4.2 g/dL (ref 3.5–4.7)
ALK PHOS: 56 IU/L (ref 39–117)
ALT: 16 IU/L (ref 0–44)
AST: 32 IU/L (ref 0–40)
BILIRUBIN TOTAL: 0.6 mg/dL (ref 0.0–1.2)
Bilirubin, Direct: 0.21 mg/dL (ref 0.00–0.40)
TOTAL PROTEIN: 6.5 g/dL (ref 6.0–8.5)

## 2014-05-08 LAB — URINE CULTURE

## 2014-05-12 ENCOUNTER — Encounter: Payer: Self-pay | Admitting: Cardiovascular Disease

## 2014-05-12 DIAGNOSIS — Z9861 Coronary angioplasty status: Secondary | ICD-10-CM | POA: Diagnosis not present

## 2014-05-12 DIAGNOSIS — Z5189 Encounter for other specified aftercare: Secondary | ICD-10-CM | POA: Diagnosis not present

## 2014-05-12 DIAGNOSIS — I252 Old myocardial infarction: Secondary | ICD-10-CM | POA: Diagnosis not present

## 2014-05-17 DIAGNOSIS — Z5189 Encounter for other specified aftercare: Secondary | ICD-10-CM | POA: Diagnosis not present

## 2014-05-17 DIAGNOSIS — Z9861 Coronary angioplasty status: Secondary | ICD-10-CM | POA: Diagnosis not present

## 2014-05-17 DIAGNOSIS — I252 Old myocardial infarction: Secondary | ICD-10-CM | POA: Diagnosis not present

## 2014-05-19 DIAGNOSIS — Z5189 Encounter for other specified aftercare: Secondary | ICD-10-CM | POA: Diagnosis not present

## 2014-05-19 DIAGNOSIS — I252 Old myocardial infarction: Secondary | ICD-10-CM | POA: Diagnosis not present

## 2014-05-19 DIAGNOSIS — Z9861 Coronary angioplasty status: Secondary | ICD-10-CM | POA: Diagnosis not present

## 2014-05-21 DIAGNOSIS — Z9861 Coronary angioplasty status: Secondary | ICD-10-CM | POA: Diagnosis not present

## 2014-05-21 DIAGNOSIS — Z5189 Encounter for other specified aftercare: Secondary | ICD-10-CM | POA: Diagnosis not present

## 2014-05-21 DIAGNOSIS — I252 Old myocardial infarction: Secondary | ICD-10-CM | POA: Diagnosis not present

## 2014-05-22 ENCOUNTER — Other Ambulatory Visit: Payer: Self-pay | Admitting: Family Medicine

## 2014-05-24 ENCOUNTER — Ambulatory Visit (INDEPENDENT_AMBULATORY_CARE_PROVIDER_SITE_OTHER): Payer: Medicare Other | Admitting: Family Medicine

## 2014-05-24 ENCOUNTER — Encounter: Payer: Self-pay | Admitting: Family Medicine

## 2014-05-24 VITALS — BP 118/70 | HR 60 | Temp 97.3°F | Wt 185.0 lb

## 2014-05-24 DIAGNOSIS — Z5189 Encounter for other specified aftercare: Secondary | ICD-10-CM | POA: Diagnosis not present

## 2014-05-24 DIAGNOSIS — N390 Urinary tract infection, site not specified: Secondary | ICD-10-CM

## 2014-05-24 DIAGNOSIS — M79604 Pain in right leg: Secondary | ICD-10-CM

## 2014-05-24 DIAGNOSIS — E785 Hyperlipidemia, unspecified: Secondary | ICD-10-CM | POA: Diagnosis not present

## 2014-05-24 DIAGNOSIS — I2510 Atherosclerotic heart disease of native coronary artery without angina pectoris: Secondary | ICD-10-CM

## 2014-05-24 DIAGNOSIS — N400 Enlarged prostate without lower urinary tract symptoms: Secondary | ICD-10-CM

## 2014-05-24 DIAGNOSIS — R300 Dysuria: Secondary | ICD-10-CM

## 2014-05-24 DIAGNOSIS — E119 Type 2 diabetes mellitus without complications: Secondary | ICD-10-CM

## 2014-05-24 DIAGNOSIS — G4700 Insomnia, unspecified: Secondary | ICD-10-CM

## 2014-05-24 DIAGNOSIS — I252 Old myocardial infarction: Secondary | ICD-10-CM | POA: Diagnosis not present

## 2014-05-24 DIAGNOSIS — M79609 Pain in unspecified limb: Secondary | ICD-10-CM

## 2014-05-24 DIAGNOSIS — Z9861 Coronary angioplasty status: Secondary | ICD-10-CM | POA: Diagnosis not present

## 2014-05-24 LAB — POCT URINALYSIS DIPSTICK
Bilirubin, UA: NEGATIVE
GLUCOSE UA: NEGATIVE
KETONES UA: NEGATIVE
LEUKOCYTES UA: NEGATIVE
Nitrite, UA: NEGATIVE
PROTEIN UA: 0.15
SPEC GRAV UA: 1.02
UROBILINOGEN UA: NEGATIVE
pH, UA: 6

## 2014-05-24 LAB — RENAL FUNCTION PANEL
Albumin: 4.3 g/dL (ref 3.5–5.2)
BUN: 16 mg/dL (ref 6–23)
CO2: 31 meq/L (ref 19–32)
Calcium: 9.4 mg/dL (ref 8.4–10.5)
Chloride: 103 mEq/L (ref 96–112)
Creatinine, Ser: 1.1 mg/dL (ref 0.4–1.5)
GFR: 68.39 mL/min (ref >60.00)
Glucose, Bld: 95 mg/dL (ref 70–99)
Phosphorus: 3.9 mg/dL (ref 2.3–4.6)
Potassium: 5 mEq/L (ref 3.5–5.1)
SODIUM: 138 meq/L (ref 135–145)

## 2014-05-24 LAB — HEMOGLOBIN A1C: HEMOGLOBIN A1C: 6.1 % (ref 4.6–6.5)

## 2014-05-24 LAB — CK: Total CK: 80 U/L (ref 7–232)

## 2014-05-24 MED ORDER — ATORVASTATIN CALCIUM 20 MG PO TABS: 20.0000 mg | ORAL_TABLET | Freq: Every day | ORAL | Status: DC

## 2014-05-24 MED ORDER — DOXAZOSIN MESYLATE 1 MG PO TABS: 1.0000 mg | ORAL_TABLET | Freq: Every day | ORAL | Status: DC

## 2014-05-24 MED ORDER — TAMSULOSIN HCL 0.4 MG PO CAPS: 0.4000 mg | ORAL_CAPSULE | Freq: Every day | ORAL | Status: DC

## 2014-05-24 NOTE — Assessment & Plan Note (Addendum)
On finasteride. Persistent LUTS despite clear UA/micro today Will add back cardura at 1mg  daily. UCx not sent today as micro reassuring. H/o dizziness with flomax.

## 2014-05-24 NOTE — Assessment & Plan Note (Signed)
continue ambien CR. Not very effective.

## 2014-05-24 NOTE — Progress Notes (Signed)
BP 118/70  Pulse 60  Temp(Src) 97.3 F (36.3 C) (Oral)  Wt 185 lb (83.915 kg)   CC: leg pain, f/u UTI  Subjective:    Patient ID: Stephen [name], male    DOB: 01-17-1927, 80 y.o.   MRN: [id number]  HPI: Stephen [name] is a 80 y.o. male presenting on 05/24/2014 for Leg Pain and Follow-up   See prior note for details. Having R>L muscle aches in legs. Describes quad soreness as well as some lower leg soreness. No claudication sxs or calf pain. Continues cardiac rehab after recently suffering MI. No back pain, leg weakness or numbness. RYR was changed to lipitor 40mg  after MI.  No results found for this basename: CKTOTAL  Still with intermittent mild dysuria at beginning of stream as well as some urgency. Nocturia 1-2 which is an improvement. Ho BPH now back on finasteride but not cardura.   Recent UTI treated with 7d course bactrim x2 with UCx CONS R cipro  Relevant past medical, surgical, family and social history reviewed and updated as indicated.  Allergies and medications reviewed and updated. Current Outpatient Prescriptions on File Prior to Visit  Medication Sig  . aspirin EC 81 MG tablet Take 81 mg by mouth daily.  . Clindamycin Phos-Benzoyl Perox (ACANYA) gel Apply topically once a day  . clopidogrel (PLAVIX) 75 MG tablet Take 75 mg by mouth daily with breakfast.  . Coenzyme Q10 (CO Q-10) 100 MG CAPS Take 1 capsule by mouth daily.  Marland Kitchen docusate sodium (COLACE) 100 MG capsule Take 100 mg by mouth 2 (two) times daily.  . finasteride (PROSCAR) 5 MG tablet Take 1 tablet (5 mg total) by mouth daily.  . metFORMIN (GLUCOPHAGE) 500 MG tablet Take 1 tablet (500 mg total) by mouth 2 (two) times daily with a meal.  . metoprolol succinate (TOPROL-XL) 25 MG 24 hr tablet Take 1 tablet (25 mg total) by mouth daily.  . metroNIDAZOLE (METROGEL) 1 % gel Apply topically daily.  . Multiple Vitamin (MULTIVITAMIN) tablet Take 1 tablet by mouth daily.  . nitroGLYCERIN (NITROSTAT) 0.4 MG SL  tablet Place 0.4 mg under the tongue every 5 (five) minutes as needed for chest pain.  . polyethylene glycol (MIRALAX / GLYCOLAX) packet Take 17 g by mouth daily as needed for moderate constipation.  . Psyllium (METAMUCIL PO) Take by mouth every morning  . zolpidem (AMBIEN CR) 6.25 MG CR tablet TAKE 1 TABLET BY MOUTH AT BEDTIME AS NEEDED   No current facility-administered medications on file prior to visit.    Review of Systems Per HPI unless specifically indicated above    Objective:    BP 118/70  Pulse 60  Temp(Src) 97.3 F (36.3 C) (Oral)  Wt 185 lb (83.915 kg)  Physical Exam  Nursing note and vitals reviewed. Constitutional: He appears well-developed and well-nourished. No distress.  HENT:  Mouth/Throat: Oropharynx is clear and moist. No oropharyngeal exudate.  Cardiovascular: Normal rate, regular rhythm, normal heart sounds and intact distal pulses.   No murmur heard. Pulmonary/Chest: Effort normal and breath sounds normal. No respiratory distress. He has no wheezes. He has no rales.  Musculoskeletal: He exhibits no edema.  No palpable cords No calf pain FROM flexion/extension at knee Soreness endorsed with palpation of quads   Results for orders placed in visit on 05/24/14  POCT URINALYSIS DIPSTICK      Result Value Ref Range   Color, UA Yellow     Clarity, UA Clear     Glucose,  UA Negative     Bilirubin, UA Negative     Ketones, UA Negative     Spec Grav, UA 1.020     Blood, UA trace     pH, UA 6.0     Protein, UA 0.15     Urobilinogen, UA negative     Nitrite, UA Negative     Leukocytes, UA Negative        Assessment & Plan:   Problem List Items Addressed This Visit   UTI (urinary tract infection)   Relevant Orders      POCT Urinalysis Dipstick (Completed)   Pain of right lower extremity      Bilateral leg cramps, right greater than left. Anticipate statin related. Prior only on RYR and tolerated this well. Check CPK today. Continues taking  coQ10. Will decrease lipitor dose to 20mg  daily. Last LDL well controlled at 40mg , there is room to decrease dose. Lab Results  Component Value Date   LDLCALC 48 05/04/2014      Relevant Orders      CK   Insomnia     continue ambien CR. Not very effective.    HLD (hyperlipidemia)   Relevant Medications      atorvastatin (LIPITOR) tablet      doxazosin (CARDURA) tablet   Diabetes type 2, controlled - Primary   Relevant Medications      atorvastatin (LIPITOR) tablet   Other Relevant Orders      Renal function panel      Hemoglobin A1c   CAD (coronary artery disease)     Continue meds - dual antiplatelet for at least 1 year     Relevant Medications      atorvastatin (LIPITOR) tablet      doxazosin (CARDURA) tablet   BPH (benign prostatic hypertrophy)     On finasteride. Persistent LUTS despite clear UA/micro today Will add back cardura at 1mg  daily. UCx not sent today as micro reassuring. H/o dizziness with flomax.     Other Visit Diagnoses   Dysuria            Follow up plan: Return in about 2 months (around 07/25/2014), or if symptoms worsen or fail to improve, for follow up visit.

## 2014-05-24 NOTE — Assessment & Plan Note (Signed)
Continue meds - dual antiplatelet for at least 1 year

## 2014-05-24 NOTE — Telephone Encounter (Signed)
Ok to refill 

## 2014-05-24 NOTE — Telephone Encounter (Signed)
Rx called in as directed.   

## 2014-05-24 NOTE — Progress Notes (Signed)
Pre visit review using our clinic review tool, if applicable. No additional management support is needed unless otherwise documented below in the visit note. 

## 2014-05-24 NOTE — Telephone Encounter (Signed)
plz phone in. 

## 2014-05-24 NOTE — Patient Instructions (Signed)
Let's decrease lipitor (atorvastatin) to 1/2 tablet or 20mg  daily. I've sent in 20mg  dose to pharmacy. Update me with effect of this on leg pains. Continue coQ10. Blood work today, and urine checked today. If no urine infection, we may start flomax for prostate. We will call you with results of blood work and urine.

## 2014-05-24 NOTE — Assessment & Plan Note (Signed)
Bilateral leg cramps, right greater than left. Anticipate statin related. Prior only on RYR and tolerated this well. Check CPK today. Continues taking coQ10. Will decrease lipitor dose to 20mg  daily. Last LDL well controlled at 40mg , there is room to decrease dose. Lab Results  Component Value Date   LDLCALC 48 05/04/2014

## 2014-05-26 DIAGNOSIS — Z9861 Coronary angioplasty status: Secondary | ICD-10-CM | POA: Diagnosis not present

## 2014-05-26 DIAGNOSIS — Z5189 Encounter for other specified aftercare: Secondary | ICD-10-CM | POA: Diagnosis not present

## 2014-05-26 DIAGNOSIS — I252 Old myocardial infarction: Secondary | ICD-10-CM | POA: Diagnosis not present

## 2014-05-28 DIAGNOSIS — I252 Old myocardial infarction: Secondary | ICD-10-CM | POA: Diagnosis not present

## 2014-05-28 DIAGNOSIS — Z9861 Coronary angioplasty status: Secondary | ICD-10-CM | POA: Diagnosis not present

## 2014-05-28 DIAGNOSIS — Z5189 Encounter for other specified aftercare: Secondary | ICD-10-CM | POA: Diagnosis not present

## 2014-05-29 ENCOUNTER — Encounter: Payer: Self-pay | Admitting: Family Medicine

## 2014-05-31 DIAGNOSIS — I252 Old myocardial infarction: Secondary | ICD-10-CM | POA: Diagnosis not present

## 2014-05-31 DIAGNOSIS — Z5189 Encounter for other specified aftercare: Secondary | ICD-10-CM | POA: Diagnosis not present

## 2014-05-31 DIAGNOSIS — Z9861 Coronary angioplasty status: Secondary | ICD-10-CM | POA: Diagnosis not present

## 2014-05-31 NOTE — Telephone Encounter (Signed)
Please see Mychart message.

## 2014-06-01 ENCOUNTER — Other Ambulatory Visit: Payer: Self-pay | Admitting: *Deleted

## 2014-06-01 DIAGNOSIS — L719 Rosacea, unspecified: Secondary | ICD-10-CM | POA: Diagnosis not present

## 2014-06-01 DIAGNOSIS — L570 Actinic keratosis: Secondary | ICD-10-CM | POA: Diagnosis not present

## 2014-06-01 DIAGNOSIS — L578 Other skin changes due to chronic exposure to nonionizing radiation: Secondary | ICD-10-CM | POA: Diagnosis not present

## 2014-06-01 MED ORDER — PRAVASTATIN SODIUM 40 MG PO TABS: 40.0000 mg | ORAL_TABLET | Freq: Every day | ORAL | Status: DC

## 2014-06-01 MED ORDER — PRAVASTATIN SODIUM 40 MG PO TABS
40.0000 mg | ORAL_TABLET | Freq: Every day | ORAL | Status: DC
Start: 2014-06-01 — End: 2014-08-06

## 2014-06-02 DIAGNOSIS — I252 Old myocardial infarction: Secondary | ICD-10-CM | POA: Diagnosis not present

## 2014-06-02 DIAGNOSIS — Z5189 Encounter for other specified aftercare: Secondary | ICD-10-CM | POA: Diagnosis not present

## 2014-06-02 DIAGNOSIS — Z9861 Coronary angioplasty status: Secondary | ICD-10-CM | POA: Diagnosis not present

## 2014-06-04 DIAGNOSIS — Z9861 Coronary angioplasty status: Secondary | ICD-10-CM | POA: Diagnosis not present

## 2014-06-04 DIAGNOSIS — I252 Old myocardial infarction: Secondary | ICD-10-CM | POA: Diagnosis not present

## 2014-06-04 DIAGNOSIS — Z5189 Encounter for other specified aftercare: Secondary | ICD-10-CM | POA: Diagnosis not present

## 2014-06-07 DIAGNOSIS — Z9861 Coronary angioplasty status: Secondary | ICD-10-CM | POA: Diagnosis not present

## 2014-06-07 DIAGNOSIS — I252 Old myocardial infarction: Secondary | ICD-10-CM | POA: Diagnosis not present

## 2014-06-07 DIAGNOSIS — Z5189 Encounter for other specified aftercare: Secondary | ICD-10-CM | POA: Diagnosis not present

## 2014-06-09 DIAGNOSIS — Z5189 Encounter for other specified aftercare: Secondary | ICD-10-CM | POA: Diagnosis not present

## 2014-06-09 DIAGNOSIS — Z9861 Coronary angioplasty status: Secondary | ICD-10-CM | POA: Diagnosis not present

## 2014-06-09 DIAGNOSIS — I252 Old myocardial infarction: Secondary | ICD-10-CM | POA: Diagnosis not present

## 2014-06-24 ENCOUNTER — Other Ambulatory Visit: Payer: Self-pay | Admitting: Family Medicine

## 2014-06-24 NOTE — Telephone Encounter (Signed)
Rx called in as directed.   

## 2014-06-24 NOTE — Telephone Encounter (Signed)
Plz phone in

## 2014-07-02 ENCOUNTER — Encounter: Payer: Self-pay | Admitting: Cardiovascular Disease

## 2014-07-02 ENCOUNTER — Ambulatory Visit (INDEPENDENT_AMBULATORY_CARE_PROVIDER_SITE_OTHER): Payer: Medicare Other | Admitting: Cardiovascular Disease

## 2014-07-02 VITALS — BP 110/78 | HR 68 | Ht 68.0 in | Wt 183.2 lb

## 2014-07-02 DIAGNOSIS — E785 Hyperlipidemia, unspecified: Secondary | ICD-10-CM | POA: Diagnosis not present

## 2014-07-02 DIAGNOSIS — I25119 Atherosclerotic heart disease of native coronary artery with unspecified angina pectoris: Secondary | ICD-10-CM

## 2014-07-02 DIAGNOSIS — I209 Angina pectoris, unspecified: Secondary | ICD-10-CM | POA: Diagnosis not present

## 2014-07-02 DIAGNOSIS — I2510 Atherosclerotic heart disease of native coronary artery without angina pectoris: Secondary | ICD-10-CM

## 2014-07-02 NOTE — Assessment & Plan Note (Signed)
He did not tolerate atorvastatin due to myalgia but seems to be doing okay with pravastatin.

## 2014-07-02 NOTE — Assessment & Plan Note (Signed)
He is doing very well with no symptoms suggestive of angina. Continue medical therapy.

## 2014-07-02 NOTE — Progress Notes (Signed)
Primary care physician: Dr. Danise Mina  HPI  This is a pleasant 80 year old man who is here today for a followup visit regarding coronary artery disease. He presented on May 12 to [facility] with acute onset of chest pain and was found to have inferior ST elevation on his EKG. He underwent emergent cardiac catheterization which showed an occluded mid RCA. He had successful angioplasty and drug-eluting stent placement. Ejection fraction was 45%. He has chronic medical conditions that include type 2 diabetes, hypertension and hyperlipidemia. He is a vegetarian and lives [facility]. He has been doing well and denies any chest pain or shortness of breath. Blood pressure was low during hospitalization and thus he was not started on beta blocker or an ACE inhibitor. He didn't tolerate Atorvastatin to myalgia. He is now on Pravastatin.    Allergies  Allergen Reactions  . Atorvastatin     muscle aches  . Penicillins Hives  . Doxycycline Rash     Current Outpatient Prescriptions on File Prior to Visit  Medication Sig Dispense Refill  . aspirin EC 81 MG tablet Take 81 mg by mouth daily.      . Clindamycin Phos-Benzoyl Perox (ACANYA) gel Apply topically once a day      . clopidogrel (PLAVIX) 75 MG tablet Take 75 mg by mouth daily with breakfast.      . Coenzyme Q10 (CO Q-10) 100 MG CAPS Take 1 capsule by mouth daily.      Marland Kitchen docusate sodium (COLACE) 100 MG capsule Take 100 mg by mouth 2 (two) times daily.      Marland Kitchen doxazosin (CARDURA) 1 MG tablet Take 1 tablet (1 mg total) by mouth daily.  30 tablet  3  . finasteride (PROSCAR) 5 MG tablet Take 1 tablet (5 mg total) by mouth daily.  30 tablet  6  . metFORMIN (GLUCOPHAGE) 500 MG tablet Take 1 tablet (500 mg total) by mouth 2 (two) times daily with a meal.  180 tablet  3  . metoprolol succinate (TOPROL-XL) 25 MG 24 hr tablet Take 1 tablet (25 mg total) by mouth daily.  90 tablet  3  . metroNIDAZOLE (METROGEL) 1 % gel Apply  topically daily.      . Multiple Vitamin (MULTIVITAMIN) tablet Take 1 tablet by mouth daily.      . nitroGLYCERIN (NITROSTAT) 0.4 MG SL tablet Place 0.4 mg under the tongue every 5 (five) minutes as needed for chest pain.      . polyethylene glycol (MIRALAX / GLYCOLAX) packet Take 17 g by mouth daily as needed for moderate constipation.      . pravastatin (PRAVACHOL) 40 MG tablet Take 1 tablet (40 mg total) by mouth daily.  30 tablet  1  . Psyllium (METAMUCIL PO) Take by mouth every morning      . zolpidem (AMBIEN CR) 6.25 MG CR tablet TAKE 1 TABLET BY MOUTH AT BEDTIME AS NEEDED  30 tablet  0   No current facility-administered medications on file prior to visit.     Past Medical History  Diagnosis Date  . History of basal cell cancer     s/p mohs  . Insomnia     treated with multiple meds in past  . Diabetes type 2, controlled   . BPH (benign prostatic hypertrophy)     w/ nocturia  . Rosacea   . Arthritis of knee   . History of colon polyps   . History of hypertension   . MI (myocardial  infarction)   . Skin cancer   . Hypertension   . UTI (urinary tract infection)   . Coronary artery disease 03/2014    Inferior ST elevation myocardial infarction. Cardiac catheterization showed an occluded mid RCA. He had an angioplasty and drug-eluting stent placement with a 3.0 x 16 mm Promus drug-eluting stent. Ejection fraction was 45% by echo.  Marland Kitchen HLD (hyperlipidemia)     diet controlled in past     Past Surgical History  Procedure Laterality Date  . Colonoscopy  08/2010    hyperplastic polyp, rec rpt 5 yrs  . Mohs surgery      basal cell chin/back  . Replacement total knee Left 08/2006  . Vasectomy  1970  . Cardiac catheterization  03/2014    [facility];x1 stent     Family History  Problem Relation Age of Onset  . Cancer Father 8    prostate and colon  . CAD Brother 79    MI, smoker  . Parkinson's disease Brother   . COPD Brother   . CAD Brother     MI  . Cancer Mother      ovarian or uterine  . Stroke Sister      History   Social History  . Marital Status: Single    Spouse Name: N/A    Number of Children: N/A  . Years of Education: N/A   Occupational History  . Not on file.   Social History Main Topics  . Smoking status: Former Smoker    Quit date: 11/12/1962  . Smokeless tobacco: Never Used     Comment: Quit 1964  . Alcohol Use: Yes     Comment: 1 beer/day  . Drug Use: No  . Sexual Activity: Not on file   Other Topics Concern  . Not on file   Social History Narrative   Lives with friend - Osvaldo Angst RN.   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 It is negative other than that mentioned in the history of present illness.   PHYSICAL EXAM   BP 110/78  Pulse 68  Ht 5\' 8"  (1.727 m)  Wt 183 lb 4 oz (83.122 kg)  BMI 27.87 kg/m2 Constitutional: He is oriented to person, place, and time. He appears well-developed and well-nourished. No distress.  HENT: No nasal discharge.  Head: Normocephalic and atraumatic.  Eyes: Pupils are equal and round.  No discharge. Neck: Normal range of motion. Neck supple. No JVD present. No thyromegaly present.  Cardiovascular: Normal rate, regular rhythm, normal heart sounds. Exam reveals no gallop and no friction rub. No murmur heard.  Pulmonary/Chest: Effort normal and breath sounds normal. No stridor. No respiratory distress. He has no wheezes. He has no rales. He exhibits no tenderness.  Abdominal: Soft. Bowel sounds are normal. He exhibits no distension. There is no tenderness. There is no rebound and no guarding.  Musculoskeletal: Normal range of motion. He exhibits no edema and no tenderness.  Neurological: He is alert and  oriented to person, place, and time. Coordination normal.  Skin: Skin is warm and dry. No rash noted. He is not diaphoretic. No erythema. No pallor.  Psychiatric: He has  a normal mood and affect. His behavior is normal. Judgment and thought content normal.       ASSESSMENT AND PLAN

## 2014-07-02 NOTE — Patient Instructions (Signed)
Continue same medications.   Your physician wants you to follow-up in: 6 months.  You will receive a reminder letter in the mail two months in advance. If you don't receive a letter, please call our office to schedule the follow-up appointment.  

## 2014-07-05 ENCOUNTER — Encounter: Payer: Self-pay | Admitting: Family Medicine

## 2014-07-08 DIAGNOSIS — H2510 Age-related nuclear cataract, unspecified eye: Secondary | ICD-10-CM | POA: Diagnosis not present

## 2014-07-23 ENCOUNTER — Other Ambulatory Visit: Payer: Self-pay | Admitting: Family Medicine

## 2014-07-23 NOTE — Telephone Encounter (Signed)
Rx called in as directed.   

## 2014-07-23 NOTE — Telephone Encounter (Signed)
plz phone in. 

## 2014-07-23 NOTE — Telephone Encounter (Signed)
Ok to refill 

## 2014-08-02 ENCOUNTER — Ambulatory Visit: Payer: Medicare Other | Admitting: Family Medicine

## 2014-08-03 ENCOUNTER — Encounter: Payer: Self-pay | Admitting: Family Medicine

## 2014-08-03 ENCOUNTER — Ambulatory Visit (INDEPENDENT_AMBULATORY_CARE_PROVIDER_SITE_OTHER): Payer: Medicare Other | Admitting: Family Medicine

## 2014-08-03 VITALS — BP 108/68 | HR 72 | Temp 97.7°F | Wt 184.2 lb

## 2014-08-03 DIAGNOSIS — E119 Type 2 diabetes mellitus without complications: Secondary | ICD-10-CM

## 2014-08-03 DIAGNOSIS — E785 Hyperlipidemia, unspecified: Secondary | ICD-10-CM

## 2014-08-03 DIAGNOSIS — M79672 Pain in left foot: Secondary | ICD-10-CM

## 2014-08-03 DIAGNOSIS — I25119 Atherosclerotic heart disease of native coronary artery with unspecified angina pectoris: Secondary | ICD-10-CM

## 2014-08-03 DIAGNOSIS — M79609 Pain in unspecified limb: Secondary | ICD-10-CM | POA: Diagnosis not present

## 2014-08-03 DIAGNOSIS — I2510 Atherosclerotic heart disease of native coronary artery without angina pectoris: Secondary | ICD-10-CM

## 2014-08-03 DIAGNOSIS — I209 Angina pectoris, unspecified: Secondary | ICD-10-CM

## 2014-08-03 NOTE — Assessment & Plan Note (Signed)
Continue metformin bid.

## 2014-08-03 NOTE — Progress Notes (Signed)
Pre visit review using our clinic review tool, if applicable. No additional management support is needed unless otherwise documented below in the visit note. 

## 2014-08-03 NOTE — Progress Notes (Signed)
BP 108/68  Pulse 72  Temp(Src) 97.7 F (36.5 C) (Oral)  Wt 184 lb 4 oz (83.575 kg)   CC: 3 mo f/u  Subjective:    Patient ID: Stephen [name], male    DOB: 12/23/1926, 80 y.o.   MRN: [id number]  HPI: Stephen [name] is a 80 y.o. male presenting on 08/03/2014 for Follow-up   Recent h/o MI. Denies recent chest pain, tightness, or dyspnea.  HLD - did not tolerate atorvastatin 2/2 myalgias, doing better on pravastatin.  L ankle ache. Denies inciting trauma/injury. Comes and goes. Aleve helped. Played golf tournament yesterday and did well with this.  Ho BPH now back on finasteride and cardura low dose. Nocturia x1.  Insomnia - ambien not as effective.  HTN - bp a bit low today, at home runs better. Only on toprol xl and cardura. BP Readings from Last 3 Encounters:  08/03/14 108/68  07/02/14 110/78  05/24/14 118/70    DM - controlled on metformin once daily. Does not check sugars. Lab Results  Component Value Date   HGBA1C 6.1 05/24/2014    Relevant past medical, surgical, family and social history reviewed and updated as indicated.  Allergies and medications reviewed and updated. Current Outpatient Prescriptions on File Prior to Visit  Medication Sig  . aspirin EC 81 MG tablet Take 81 mg by mouth daily.  . Clindamycin Phos-Benzoyl Perox (ACANYA) gel Apply topically once a day  . clopidogrel (PLAVIX) 75 MG tablet Take 75 mg by mouth daily with breakfast.  . Coenzyme Q10 (CO Q-10) 100 MG CAPS Take 1 capsule by mouth daily.  Marland Kitchen docusate sodium (COLACE) 100 MG capsule Take 100 mg by mouth 2 (two) times daily.  Marland Kitchen doxazosin (CARDURA) 1 MG tablet Take 1 tablet (1 mg total) by mouth daily.  . finasteride (PROSCAR) 5 MG tablet Take 1 tablet (5 mg total) by mouth daily.  . metFORMIN (GLUCOPHAGE) 500 MG tablet Take 1 tablet (500 mg total) by mouth 2 (two) times daily with a meal.  . metoprolol succinate (TOPROL-XL) 25 MG 24 hr tablet Take 1 tablet (25 mg total) by mouth daily.    . metroNIDAZOLE (METROGEL) 1 % gel Apply topically daily.  . Multiple Vitamin (MULTIVITAMIN) tablet Take 1 tablet by mouth daily.  . nitroGLYCERIN (NITROSTAT) 0.4 MG SL tablet Place 0.4 mg under the tongue every 5 (five) minutes as needed for chest pain.  . polyethylene glycol (MIRALAX / GLYCOLAX) packet Take 17 g by mouth daily as needed for moderate constipation.  . pravastatin (PRAVACHOL) 40 MG tablet Take 1 tablet (40 mg total) by mouth daily.  . Psyllium (METAMUCIL PO) Take by mouth every morning  . zolpidem (AMBIEN CR) 6.25 MG CR tablet TAKE 1 TABLET BY MOUTH EVERY NIGHT AT BEDTIME   No current facility-administered medications on file prior to visit.    Review of Systems Per HPI unless specifically indicated above    Objective:    BP 108/68  Pulse 72  Temp(Src) 97.7 F (36.5 C) (Oral)  Wt 184 lb 4 oz (83.575 kg)  Physical Exam  Nursing note and vitals reviewed. Constitutional: He appears well-developed and well-nourished. No distress.  HENT:  Mouth/Throat: Oropharynx is clear and moist. No oropharyngeal exudate.  Eyes: Conjunctivae and EOM are normal. Pupils are equal, round, and reactive to light.  Cardiovascular: Normal rate, regular rhythm, normal heart sounds and intact distal pulses.   No murmur heard. Pulmonary/Chest: Effort normal and breath sounds normal. No respiratory distress. He  has no wheezes. He has no rales.  Musculoskeletal: He exhibits no edema.  No ankle ligament laxity. Mild discomfort to palpation mid dorsal shaft of 3rd/4th L MTs 2+ DP bilaterally  Skin: Skin is warm and dry. No rash noted.       Assessment & Plan:   Problem List Items Addressed This Visit   Left foot pain - Primary     Pain right at MTs on right. <1wk duration, may be improving (yesterday while golfing no pain) Advised monitor for now, rest foot, elevate leg.  If not improving as expected, update [place] for further evaluation - would eval for stress fx.  Pt agrees with plan.     HLD (hyperlipidemia)      Chronic, stable. Tolerating pravastatin better than prior lipitor. Continue med. Will need FLP next labwork. Lab Results  Component Value Date   LDLCALC 48 05/04/2014      Diabetes type 2, controlled     Continue metformin bid.    Coronary artery disease     Continue aspirin/plavix. Dual antiplatelet for 1 yaer.        Follow up plan: Return in about 6 months (around 02/01/2015), or if symptoms worsen or fail to improve, for medicare wellness visit.

## 2014-08-03 NOTE — Patient Instructions (Signed)
Let us know when you get your flu shot at [facility]. Good to see you today, call us with questions Let us know if left foot pain not improving with rest and elevation.

## 2014-08-03 NOTE — Assessment & Plan Note (Signed)
Pain right at MTs on right. <1wk duration, may be improving (yesterday while golfing no pain) Advised monitor for now, rest foot, elevate leg.  If not improving as expected, update [place] for further evaluation - would eval for stress fx.  Pt agrees with plan.

## 2014-08-03 NOTE — Assessment & Plan Note (Signed)
Chronic, stable. Tolerating pravastatin better than prior lipitor. Continue med. Will need FLP next labwork. Lab Results  Component Value Date   LDLCALC 48 05/04/2014

## 2014-08-03 NOTE — Assessment & Plan Note (Addendum)
Continue aspirin/plavix. Dual antiplatelet for 1 yaer.

## 2014-08-06 ENCOUNTER — Other Ambulatory Visit: Payer: Self-pay | Admitting: Family Medicine

## 2014-08-23 ENCOUNTER — Other Ambulatory Visit: Payer: Self-pay | Admitting: Family Medicine

## 2014-08-23 NOTE — Telephone Encounter (Signed)
Rx called in as directed.   

## 2014-08-23 NOTE — Telephone Encounter (Signed)
Ok to refill 

## 2014-08-23 NOTE — Telephone Encounter (Signed)
plz phone in. 

## 2014-09-02 ENCOUNTER — Other Ambulatory Visit: Payer: Self-pay | Admitting: Family Medicine

## 2014-09-02 DIAGNOSIS — Z23 Encounter for immunization: Secondary | ICD-10-CM | POA: Diagnosis not present

## 2014-09-23 ENCOUNTER — Other Ambulatory Visit: Payer: Self-pay | Admitting: Family Medicine

## 2014-09-23 NOTE — Telephone Encounter (Signed)
Rx called in as directed.   

## 2014-09-23 NOTE — Telephone Encounter (Signed)
plz phone in. 

## 2014-10-26 ENCOUNTER — Encounter: Payer: Self-pay | Admitting: Family Medicine

## 2014-10-26 ENCOUNTER — Other Ambulatory Visit: Payer: Self-pay | Admitting: Family Medicine

## 2014-10-26 MED ORDER — SUVOREXANT 10 MG PO TABS: 10.0000 mg | ORAL_TABLET | Freq: Every evening | ORAL | Status: DC | PRN

## 2014-10-26 MED ORDER — ZOLPIDEM TARTRATE ER 6.25 MG PO TBCR: 6.2500 mg | EXTENDED_RELEASE_TABLET | Freq: Every day | ORAL | Status: DC

## 2014-10-26 NOTE — Telephone Encounter (Signed)
plz phone in [place] - we will likely need to do PA for belsomra. plz phone in [place] for pt to use in the interim.

## 2014-10-27 NOTE — Telephone Encounter (Signed)
Rx's called in as directed. PA for Belsomra in your IN box for completion.

## 2014-10-28 NOTE — Telephone Encounter (Signed)
Form filled and placed in Stephen [name]'s box.

## 2014-10-28 NOTE — Telephone Encounter (Signed)
Form faxed. Will await determination. 

## 2014-11-01 ENCOUNTER — Other Ambulatory Visit: Payer: Self-pay | Admitting: Family Medicine

## 2014-11-01 ENCOUNTER — Telehealth: Payer: Self-pay | Admitting: Family Medicine

## 2014-11-01 ENCOUNTER — Encounter: Payer: Self-pay | Admitting: Family Medicine

## 2014-11-01 ENCOUNTER — Other Ambulatory Visit: Payer: Self-pay | Admitting: Internal Medicine

## 2014-11-01 DIAGNOSIS — G4700 Insomnia, unspecified: Secondary | ICD-10-CM

## 2014-11-01 MED ORDER — ESZOPICLONE 2 MG PO TABS: 2.0000 mg | ORAL_TABLET | Freq: Every evening | ORAL | Status: DC | PRN

## 2014-11-01 NOTE — Telephone Encounter (Signed)
Will give new RX for lunesta to give him when he brings in the RX for belsomra

## 2014-11-01 NOTE — Telephone Encounter (Signed)
Patient E-mailed the following message to our [place] website:    I recently obtained an Rx for [place] from Dr Danise Mina.I have now taken it four nights in a row and it is not working. I am unable to go to sleep and I have had crazy dreams. Therefore, I would like to return the unused pills and obtain a new RX.  I would like to try Lunesta 2 mg again.  Please confirm that you rec'd this message and have submitted an Rx.  Katrina H. Coin

## 2014-11-01 NOTE — Telephone Encounter (Signed)
Patient's wife notified and will have patient bring in the unused belsomra and p/u Rx for Lunesta.

## 2014-11-22 ENCOUNTER — Other Ambulatory Visit: Payer: Self-pay | Admitting: Family Medicine

## 2014-11-22 ENCOUNTER — Other Ambulatory Visit (INDEPENDENT_AMBULATORY_CARE_PROVIDER_SITE_OTHER): Payer: Medicare Other

## 2014-11-22 DIAGNOSIS — I25119 Atherosclerotic heart disease of native coronary artery with unspecified angina pectoris: Secondary | ICD-10-CM | POA: Diagnosis not present

## 2014-11-22 DIAGNOSIS — E785 Hyperlipidemia, unspecified: Secondary | ICD-10-CM | POA: Diagnosis not present

## 2014-11-22 DIAGNOSIS — E119 Type 2 diabetes mellitus without complications: Secondary | ICD-10-CM

## 2014-11-22 LAB — CBC WITH DIFFERENTIAL/PLATELET
BASOS ABS: 0 10*3/uL (ref 0.0–0.1)
BASOS PCT: 0.6 % (ref 0.0–3.0)
Eosinophils Absolute: 0.2 10*3/uL (ref 0.0–0.7)
Eosinophils Relative: 2.7 % (ref 0.0–5.0)
HCT: 44.5 % (ref 39.0–52.0)
HEMOGLOBIN: 14.6 g/dL (ref 13.0–17.0)
Lymphocytes Relative: 27.5 % (ref 12.0–46.0)
Lymphs Abs: 2.2 10*3/uL (ref 0.7–4.0)
MCHC: 32.7 g/dL (ref 30.0–36.0)
MCV: 96.5 fl (ref 78.0–100.0)
MONOS PCT: 9.8 % (ref 3.0–12.0)
Monocytes Absolute: 0.8 10*3/uL (ref 0.1–1.0)
Neutro Abs: 4.7 10*3/uL (ref 1.4–7.7)
Neutrophils Relative %: 59.4 % (ref 43.0–77.0)
Platelets: 286 10*3/uL (ref 150.0–400.0)
RBC: 4.61 Mil/uL (ref 4.22–5.81)
RDW: 13.4 % (ref 11.5–15.5)
WBC: 8 10*3/uL (ref 4.0–10.5)

## 2014-11-22 LAB — LIPID PANEL
CHOL/HDL RATIO: 3
CHOLESTEROL: 146 mg/dL (ref 0–200)
HDL: 48.3 mg/dL (ref >39.00)
LDL Cholesterol: 70 mg/dL (ref 0–99)
NonHDL: 97.7
Triglycerides: 141 mg/dL (ref 0.0–149.0)
VLDL: 28.2 mg/dL (ref 0.0–40.0)

## 2014-11-22 LAB — BASIC METABOLIC PANEL
BUN: 13 mg/dL (ref 6–23)
CHLORIDE: 104 meq/L (ref 96–112)
CO2: 28 meq/L (ref 19–32)
Calcium: 9.1 mg/dL (ref 8.4–10.5)
Creatinine, Ser: 1.2 mg/dL (ref 0.4–1.5)
GFR: 59.98 mL/min — ABNORMAL LOW (ref >60.00)
Glucose, Bld: 112 mg/dL — ABNORMAL HIGH (ref 70–99)
POTASSIUM: 4.6 meq/L (ref 3.5–5.1)
Sodium: 139 mEq/L (ref 135–145)

## 2014-11-22 LAB — TSH: TSH: 1.65 u[IU]/mL (ref 0.35–4.50)

## 2014-11-22 LAB — HEMOGLOBIN A1C: HEMOGLOBIN A1C: 6.4 % (ref 4.6–6.5)

## 2014-11-29 ENCOUNTER — Encounter: Payer: Self-pay | Admitting: Family Medicine

## 2014-11-29 ENCOUNTER — Telehealth: Payer: Self-pay | Admitting: *Deleted

## 2014-11-29 ENCOUNTER — Ambulatory Visit (INDEPENDENT_AMBULATORY_CARE_PROVIDER_SITE_OTHER): Payer: Medicare Other | Admitting: Family Medicine

## 2014-11-29 VITALS — BP 118/76 | HR 68 | Temp 97.7°F | Ht 66.75 in | Wt 189.5 lb

## 2014-11-29 DIAGNOSIS — E785 Hyperlipidemia, unspecified: Secondary | ICD-10-CM

## 2014-11-29 DIAGNOSIS — Z86010 Personal history of colonic polyps: Secondary | ICD-10-CM

## 2014-11-29 DIAGNOSIS — Z0000 Encounter for general adult medical examination without abnormal findings: Secondary | ICD-10-CM | POA: Diagnosis not present

## 2014-11-29 DIAGNOSIS — Z23 Encounter for immunization: Secondary | ICD-10-CM | POA: Diagnosis not present

## 2014-11-29 DIAGNOSIS — E119 Type 2 diabetes mellitus without complications: Secondary | ICD-10-CM

## 2014-11-29 DIAGNOSIS — Z7189 Other specified counseling: Secondary | ICD-10-CM

## 2014-11-29 DIAGNOSIS — Z515 Encounter for palliative care: Secondary | ICD-10-CM | POA: Insufficient documentation

## 2014-11-29 DIAGNOSIS — G4700 Insomnia, unspecified: Secondary | ICD-10-CM

## 2014-11-29 DIAGNOSIS — I25119 Atherosclerotic heart disease of native coronary artery with unspecified angina pectoris: Secondary | ICD-10-CM

## 2014-11-29 DIAGNOSIS — Z1211 Encounter for screening for malignant neoplasm of colon: Secondary | ICD-10-CM

## 2014-11-29 MED ORDER — ESZOPICLONE 2 MG PO TABS: 2.0000 mg | ORAL_TABLET | Freq: Every evening | ORAL | Status: DC | PRN

## 2014-11-29 NOTE — Progress Notes (Signed)
BP 118/76 mmHg  Pulse 68  Temp(Src) 97.7 F (36.5 C) (Oral)  Ht 5' 6.75" (1.695 m)  Wt 189 lb 8 oz (85.957 kg)  BMI 29.92 kg/m2   CC: medicare wellness  Subjective:    Patient ID: Stephen [name], male    DOB: [birth year], 81 y.o.   MRN: [id number]  HPI: Stephen [name] is a 81 y.o. male presenting on 11/29/2014 for Annual Exam   MI with stent 03/2014. Denies chest pain, dyspnea. Has f/u appt with Dr Fletcher Anon next month. Notices decreased stamina.  Noticing some new paresthesias of R hand. No arm of hand or neck pain. Feet not affected. \We may check B12 next blood work.  Chronic insomnia - ambien ineffective. Requests lunesta refill which is working ok. Belsomra caused side effects.   Vision screen with eye doctor. Deferred hearing screen 2/2 hearing aide use. No falls or anhedonia, depression, sadness.  Preventative: Colonoscopy done 2011 would be due for rpt 2016 (done at [facility]) - discussed, would start with stool kit.  Prostate - aged out Flu thinks done at [facility] 05/2008  Pneumvax 2012, prevnar today zostavax 05/2008  Advanced directive in chart 03/2014: HCPOA - Osvaldo Angst.   Lives at [facility] with friend - Osvaldo Angst RN.  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: good water, vegetarian   Relevant past medical, surgical, family and social history reviewed and updated as indicated. Interim medical history since our last visit reviewed. Allergies and medications reviewed and updated. Current Outpatient Prescriptions on File Prior to Visit  Medication Sig  . aspirin EC 81 MG tablet Take 81 mg by mouth daily.  . Clindamycin Phos-Benzoyl Perox (ACANYA) gel Apply topically once a day  . clopidogrel (PLAVIX) 75 MG tablet Take 75 mg by mouth daily with breakfast.  . Coenzyme Q10 (CO Q-10) 100  MG CAPS Take 1 capsule by mouth daily.  Marland Kitchen docusate sodium (COLACE) 100 MG capsule Take 100 mg by mouth 2 (two) times daily.  Marland Kitchen doxazosin (CARDURA) 1 MG tablet TAKE 1 TABLET (1 MG TOTAL) BY MOUTH DAILY.  . finasteride (PROSCAR) 5 MG tablet TAKE 1 TABLET BY MOUTH DAILY.  . metFORMIN (GLUCOPHAGE) 500 MG tablet TAKE 1 TABLET (500 MG TOTAL) BY MOUTH 2 (TWO) TIMES DAILY WITH A MEAL.  . metoprolol succinate (TOPROL-XL) 25 MG 24 hr tablet Take 1 tablet (25 mg total) by mouth daily.  . metroNIDAZOLE (METROGEL) 1 % gel Apply topically daily.  . Multiple Vitamin (MULTIVITAMIN) tablet Take 1 tablet by mouth daily.  . nitroGLYCERIN (NITROSTAT) 0.4 MG SL tablet Place 0.4 mg under the tongue every 5 (five) minutes as needed for chest pain.  . polyethylene glycol (MIRALAX / GLYCOLAX) packet Take 17 g by mouth daily as needed for moderate constipation.  . pravastatin (PRAVACHOL) 40 MG tablet TAKE 1 TABLET (40 MG TOTAL) BY MOUTH DAILY.  Marland Kitchen Psyllium (METAMUCIL PO) Take by mouth every morning   No current facility-administered medications on file prior to visit.    Review of Systems Per HPI unless specifically indicated above     Objective:    BP 118/76 mmHg  Pulse 68  Temp(Src) 97.7 F (36.5 C) (Oral)  Ht 5' 6.75" (1.695 m)  Wt 189 lb 8 oz (85.957 kg)  BMI 29.92 kg/m2  Wt Readings from Last  3 Encounters:  11/29/14 189 lb 8 oz (85.957 kg)  08/03/14 184 lb 4 oz (83.575 kg)  07/02/14 183 lb 4 oz (83.122 kg)    Physical Exam  Constitutional: He is oriented to person, place, and time. He appears well-developed and well-nourished. No distress.  HENT:  Head: Normocephalic and atraumatic.  Right Ear: Decreased hearing is noted.  Left Ear: Decreased hearing is noted.  Nose: Nose normal.  Mouth/Throat: Uvula is midline, oropharynx is clear and moist and mucous membranes are normal. No oropharyngeal exudate, posterior oropharyngeal edema or posterior oropharyngeal erythema.  Hearing aides in place  Eyes:  Conjunctivae and EOM are normal. Pupils are equal, round, and reactive to light. No scleral icterus.  Neck: Normal range of motion. Neck supple. Carotid bruit is not present. No thyromegaly present.  Cardiovascular: Normal rate, regular rhythm, normal heart sounds and intact distal pulses.   No murmur heard. Pulses:      Radial pulses are 2+ on the right side, and 2+ on the left side.  Pulmonary/Chest: Effort normal and breath sounds normal. No respiratory distress. He has no wheezes. He has no rales.  Abdominal: Soft. Bowel sounds are normal. He exhibits no distension and no mass. There is no tenderness. There is no rebound and no guarding.  Musculoskeletal: Normal range of motion. He exhibits no edema.  Lymphadenopathy:    He has no cervical adenopathy.  Neurological: He is alert and oriented to person, place, and time.  CN grossly intact, station and gait intact Recall 2/3 Calculation serial 7s 3/5  Skin: Skin is warm and dry. No rash noted.  Psychiatric: He has a normal mood and affect. His behavior is normal. Judgment and thought content normal.  Nursing note and vitals reviewed.  Results for orders placed or performed in visit on 11/22/14  Lipid panel  Result Value Ref Range   Cholesterol 146 0 - 200 mg/dL   Triglycerides 545.6 0.0 - 149.0 mg/dL   HDL 25.63 >89.37 mg/dL   VLDL 34.2 0.0 - 87.6 mg/dL   LDL Cholesterol 70 0 - 99 mg/dL   Total CHOL/HDL Ratio 3    NonHDL 97.70   Hemoglobin A1c  Result Value Ref Range   Hgb A1c MFr Bld 6.4 4.6 - 6.5 %  Basic metabolic panel  Result Value Ref Range   Sodium 139 135 - 145 mEq/L   Potassium 4.6 3.5 - 5.1 mEq/L   Chloride 104 96 - 112 mEq/L   CO2 28 19 - 32 mEq/L   Glucose, Bld 112 (H) 70 - 99 mg/dL   BUN 13 6 - 23 mg/dL   Creatinine, Ser 1.2 0.4 - 1.5 mg/dL   Calcium 9.1 8.4 - 81.1 mg/dL   GFR 57.26 (L) >20.35 mL/min  CBC with Differential  Result Value Ref Range   WBC 8.0 4.0 - 10.5 K/uL   RBC 4.61 4.22 - 5.81 Mil/uL    Hemoglobin 14.6 13.0 - 17.0 g/dL   HCT 59.7 41.6 - 38.4 %   MCV 96.5 78.0 - 100.0 fl   MCHC 32.7 30.0 - 36.0 g/dL   RDW 53.6 46.8 - 03.2 %   Platelets 286.0 150.0 - 400.0 K/uL   Neutrophils Relative % 59.4 43.0 - 77.0 %   Lymphocytes Relative 27.5 12.0 - 46.0 %   Monocytes Relative 9.8 3.0 - 12.0 %   Eosinophils Relative 2.7 0.0 - 5.0 %   Basophils Relative 0.6 0.0 - 3.0 %   Neutro Abs 4.7 1.4 - 7.7  K/uL   Lymphs Abs 2.2 0.7 - 4.0 K/uL   Monocytes Absolute 0.8 0.1 - 1.0 K/uL   Eosinophils Absolute 0.2 0.0 - 0.7 K/uL   Basophils Absolute 0.0 0.0 - 0.1 K/uL  TSH  Result Value Ref Range   TSH 1.65 0.35 - 4.50 uIU/mL      Assessment & Plan:   Problem List Items Addressed This Visit    Medicare annual wellness visit, subsequent - Primary    I have personally reviewed the Medicare Annual Wellness questionnaire and have noted 1. The patient's medical and social history 2. Their use of alcohol, tobacco or illicit drugs 3. Their current medications and supplements 4. The patient's functional ability including ADL's, fall risks, home safety risks and hearing or visual impairment. 5. Diet and physical activity 6. Evidence for depression or mood disorders The patients weight, height, BMI have been recorded in the chart.  Hearing and vision has been addressed. I have made referrals, counseling and provided education to the patient based review of the above and I have provided the pt with a written personalized care plan for preventive services. Provider list updated - see scanned questionairre. Reviewed preventative protocols and updated unless pt declined.       Insomnia    Continue lunesta working "ok"      HLD (hyperlipidemia)    Chronic, stable. Continue pravastatin. LDL at goal.      History of colon polyps    Pt requests stool kit today. If normal consider aging out.      Diabetes type 2, controlled    Chronic, stable. Continue metformin bid.      Coronary artery  disease    Asxs. Continue aspirin/plavix and other medical management.      Advanced care planning/counseling discussion    Advanced directive in chart 03/2014 - HCPOA is Osvaldo Angst       Other Visit Diagnoses    Special screening for malignant neoplasms, colon        Relevant Orders    Fecal occult blood, imunochemical        Follow up plan: Return in about 6 months (around 05/30/2015), or as needed, for follow up visit.

## 2014-11-29 NOTE — Telephone Encounter (Signed)
PA for Lunesta in your IN box for completion.

## 2014-11-29 NOTE — Assessment & Plan Note (Signed)
Pt requests stool kit today. If normal consider aging out.

## 2014-11-29 NOTE — Assessment & Plan Note (Signed)
Chronic, stable. Continue metformin bid.

## 2014-11-29 NOTE — Assessment & Plan Note (Signed)
Chronic, stable. Continue pravastatin. LDL at goal.

## 2014-11-29 NOTE — Assessment & Plan Note (Addendum)
Advanced directive in chart 03/2014 - HCPOA is Stephen [name]

## 2014-11-29 NOTE — Patient Instructions (Addendum)
Prevnar today. Pass by lab to pick up a stool kit. You are doing well today. Return as needed or in 6 months for diabetes follow up.

## 2014-11-29 NOTE — Assessment & Plan Note (Signed)

## 2014-11-29 NOTE — Progress Notes (Signed)
Pre visit review using our clinic review tool, if applicable. No additional management support is needed unless otherwise documented below in the visit note. 

## 2014-11-29 NOTE — Telephone Encounter (Signed)
Form faxed. Will await determination. 

## 2014-11-29 NOTE — Assessment & Plan Note (Signed)
Continue lunesta working "ok"

## 2014-11-29 NOTE — Telephone Encounter (Signed)
filled and in Kim's box. 

## 2014-11-29 NOTE — Addendum Note (Signed)
Addended by: Royann Shivers A on: 11/29/2014 09:46 AM   Modules accepted: Orders

## 2014-11-29 NOTE — Assessment & Plan Note (Signed)
Asxs. Continue aspirin/plavix and other medical management.

## 2014-11-30 NOTE — Telephone Encounter (Signed)
Spoke with patient and advised that I haven't gotten a response yet and that it can take up to 72 hours or more and that I would let him know as soon as I hear from them.

## 2014-11-30 NOTE — Telephone Encounter (Signed)
Pt left v/m requesting status of PA for Lunesta.

## 2014-12-02 ENCOUNTER — Other Ambulatory Visit: Payer: Medicare Other

## 2014-12-02 DIAGNOSIS — Z1211 Encounter for screening for malignant neoplasm of colon: Secondary | ICD-10-CM

## 2014-12-02 LAB — FECAL OCCULT BLOOD, GUAIAC: Fecal Occult Blood: NEGATIVE

## 2014-12-06 ENCOUNTER — Encounter: Payer: Self-pay | Admitting: *Deleted

## 2014-12-06 LAB — FECAL OCCULT BLOOD, IMMUNOCHEMICAL: Fecal Occult Bld: NEGATIVE

## 2014-12-08 NOTE — Telephone Encounter (Signed)
Approval received. Patient and pharmacy notified.

## 2014-12-26 ENCOUNTER — Other Ambulatory Visit: Payer: Self-pay | Admitting: Family Medicine

## 2014-12-30 ENCOUNTER — Ambulatory Visit (INDEPENDENT_AMBULATORY_CARE_PROVIDER_SITE_OTHER): Payer: Medicare Other | Admitting: Cardiovascular Disease

## 2014-12-30 ENCOUNTER — Encounter: Payer: Self-pay | Admitting: Cardiovascular Disease

## 2014-12-30 VITALS — BP 108/78 | HR 62 | Ht 68.0 in | Wt 191.0 lb

## 2014-12-30 DIAGNOSIS — I25119 Atherosclerotic heart disease of native coronary artery with unspecified angina pectoris: Secondary | ICD-10-CM

## 2014-12-30 DIAGNOSIS — E785 Hyperlipidemia, unspecified: Secondary | ICD-10-CM

## 2014-12-30 NOTE — Assessment & Plan Note (Signed)
He is doing very well with no symptoms suggestive of angina. Continue dual antiplatelet therapy for at least 12 months. Plavix can be discontinued after May and I will consider that during his next visit in 6 months.

## 2014-12-30 NOTE — Assessment & Plan Note (Signed)
Lab Results  Component Value Date   CHOL 146 11/22/2014   HDL 48.30 11/22/2014   LDLCALC 70 11/22/2014   TRIG 141.0 11/22/2014   CHOLHDL 3 11/22/2014   LDL is at target with pravastatin 40 mg daily. He did not tolerate atorvastatin due to myalgia.

## 2014-12-30 NOTE — Progress Notes (Signed)
Primary care physician: Dr. Danise Mina  HPI  This is a pleasant 81 year old man who is here today for a followup visit regarding coronary artery disease. He presented in May, 2015 to [facility] with acute onset of chest pain and was found to have inferior ST elevation on his EKG. He underwent emergent cardiac catheterization which showed an occluded mid RCA. He had successful angioplasty and drug-eluting stent placement. Ejection fraction was 45%. He has chronic medical conditions that include type 2 diabetes, hypertension and hyperlipidemia. He is a vegetarian since age 3 and lives at [facility]. He has been doing well and denies any chest pain or shortness of breath. He didn't tolerate Atorvastatin to myalgia. He is now on Pravastatin.    Allergies  Allergen Reactions  . Atorvastatin     muscle aches  . Belsomra [Suvorexant] Other (See Comments)    anxiety  . Penicillins Hives  . Doxycycline Rash     Current Outpatient Prescriptions on File Prior to Visit  Medication Sig Dispense Refill  . aspirin EC 81 MG tablet Take 81 mg by mouth daily.    . Clindamycin Phos-Benzoyl Perox (ACANYA) gel Apply topically once a day    . clopidogrel (PLAVIX) 75 MG tablet Take 75 mg by mouth daily with breakfast.    . Coenzyme Q10 (CO Q-10) 100 MG CAPS Take 1 capsule by mouth daily.    Marland Kitchen docusate sodium (COLACE) 100 MG capsule Take 100 mg by mouth 2 (two) times daily.    Marland Kitchen doxazosin (CARDURA) 1 MG tablet TAKE 1 TABLET (1 MG TOTAL) BY MOUTH DAILY. 30 tablet 3  . eszopiclone (LUNESTA) 2 MG TABS tablet Take 1 tablet (2 mg total) by mouth at bedtime as needed for sleep. Take immediately before bedtime 30 tablet 3  . finasteride (PROSCAR) 5 MG tablet TAKE 1 TABLET BY MOUTH DAILY. 30 tablet 3  . metFORMIN (GLUCOPHAGE) 500 MG tablet TAKE 1 TABLET (500 MG TOTAL) BY MOUTH 2 (TWO) TIMES DAILY WITH A MEAL. 180 tablet 3  . metoprolol succinate (TOPROL-XL) 25 MG 24 hr tablet Take 1 tablet (25 mg  total) by mouth daily. 90 tablet 3  . metroNIDAZOLE (METROGEL) 1 % gel Apply topically daily.    . Multiple Vitamin (MULTIVITAMIN) tablet Take 1 tablet by mouth daily.    . nitroGLYCERIN (NITROSTAT) 0.4 MG SL tablet Place 0.4 mg under the tongue every 5 (five) minutes as needed for chest pain.    . pravastatin (PRAVACHOL) 40 MG tablet TAKE 1 TABLET (40 MG TOTAL) BY MOUTH DAILY. 30 tablet 5  . Psyllium (METAMUCIL PO) Take by mouth every morning     No current facility-administered medications on file prior to visit.     Past Medical History  Diagnosis Date  . History of basal cell cancer     s/p mohs  . Insomnia     treated with multiple meds in past  . Diabetes type 2, controlled   . BPH (benign prostatic hypertrophy)     w/ nocturia  . Rosacea   . Arthritis of knee   . History of colon polyps   . History of hypertension   . MI (myocardial infarction)   . Skin cancer   . Hypertension   . UTI (urinary tract infection)   . Coronary artery disease 03/2014    Inferior ST elevation myocardial infarction. Cardiac catheterization showed an occluded mid RCA. He had an angioplasty and drug-eluting stent placement with a 3.0 x 16 mm  Promus drug-eluting stent. Ejection fraction was 45% by echo, completed cardiac rehab 06/2014  . HLD (hyperlipidemia)     diet controlled in past     Past Surgical History  Procedure Laterality Date  . Colonoscopy  08/2010    hyperplastic polyp, rec rpt 5 yrs  . Mohs surgery      basal cell chin/back  . Replacement total knee Left 08/2006  . Vasectomy  1970  . Cardiac catheterization  03/2014    [facility];x1 stent     Family History  Problem Relation Age of Onset  . Cancer Father 67    prostate and colon  . CAD Brother 27    MI, smoker  . Parkinson's disease Brother   . COPD Brother   . CAD Brother     MI  . Cancer Mother     ovarian or uterine  . Stroke Sister      History   Social History  . Marital Status: Single    Spouse Name: N/A    . Number of Children: N/A  . Years of Education: N/A   Occupational History  . Not on file.   Social History Main Topics  . Smoking status: Former Smoker    Quit date: 11/12/1962  . Smokeless tobacco: Never Used     Comment: Quit 1964  . Alcohol Use: Yes     Comment: 1 beer/day  . Drug Use: No  . Sexual Activity: Not on file   Other Topics Concern  . Not on file   Social History Narrative   Lives at [facility] with friend - Osvaldo Angst RN.   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 It is negative other than that mentioned in the history of present illness.   PHYSICAL EXAM   BP 108/78 mmHg  Pulse 62  Ht 5\' 8"  (1.727 m)  Wt 191 lb (86.637 kg)  BMI 29.05 kg/m2 Constitutional: He is oriented to person, place, and time. He appears well-developed and well-nourished. No distress.  HENT: No nasal discharge.  Head: Normocephalic and atraumatic.  Eyes: Pupils are equal and round.  No discharge. Neck: Normal range of motion. Neck supple. No JVD present. No thyromegaly present.  Cardiovascular: Normal rate, regular rhythm, normal heart sounds. Exam reveals no gallop and no friction rub. No murmur heard.  Pulmonary/Chest: Effort normal and breath sounds normal. No stridor. No respiratory distress. He has no wheezes. He has no rales. He exhibits no tenderness.  Abdominal: Soft. Bowel sounds are normal. He exhibits no distension. There is no tenderness. There is no rebound and no guarding.  Musculoskeletal: Normal range of motion. He exhibits no edema and no tenderness.  Neurological: He is alert and oriented to person, place, and time. Coordination normal.  Skin: Skin is warm and dry. No rash noted. He is not diaphoretic. No erythema. No pallor.  Psychiatric: He  has a normal mood and affect. His behavior is normal. Judgment and thought content normal.     [id number]  Rhythm  Low voltage in precordial leads.   -Inferior infarct -age undetermined.   ABNORMAL     ASSESSMENT AND PLAN

## 2014-12-30 NOTE — Patient Instructions (Signed)
Continue same medications.   Your physician wants you to follow-up in: 6 months.  You will receive a reminder letter in the mail two months in advance. If you don't receive a letter, please call our office to schedule the follow-up appointment.  

## 2015-01-07 ENCOUNTER — Ambulatory Visit (INDEPENDENT_AMBULATORY_CARE_PROVIDER_SITE_OTHER): Payer: Medicare Other | Admitting: Family Medicine

## 2015-01-07 ENCOUNTER — Encounter: Payer: Self-pay | Admitting: Family Medicine

## 2015-01-07 VITALS — BP 116/62 | HR 63 | Temp 97.3°F | Wt 187.2 lb

## 2015-01-07 DIAGNOSIS — R1111 Vomiting without nausea: Secondary | ICD-10-CM

## 2015-01-07 DIAGNOSIS — I25119 Atherosclerotic heart disease of native coronary artery with unspecified angina pectoris: Secondary | ICD-10-CM | POA: Diagnosis not present

## 2015-01-07 DIAGNOSIS — R1310 Dysphagia, unspecified: Secondary | ICD-10-CM | POA: Diagnosis not present

## 2015-01-07 NOTE — Patient Instructions (Addendum)
Pass by Marion's office for referral to GI doctor in [place] Let us know if symptoms worsen or fail to improve.

## 2015-01-07 NOTE — Assessment & Plan Note (Addendum)
No h/o GERD. Not typical esophageal or oropharyngeal dysphagia. Has never had EGD.  Anticipate more esoph stricture related, ?achalasia. Will refer to GI to discuss further evaluation. Pt and wife agree with plan today. No significant red flags today.

## 2015-01-07 NOTE — Progress Notes (Signed)
BP 116/62 mmHg  Pulse 63  Temp(Src) 97.3 F (36.3 C) (Oral)  Wt 187 lb 4 oz (84.936 kg)  SpO2 97%   CC: check GERD  Subjective:    Patient ID: Stephen [name], male    DOB: February 22, 1927, 81 y.o.   MRN: [id number]  HPI: Stephen [name] is a 81 y.o. male presenting on 01/07/2015 for Gastrophageal Reflux   Presents with wife today.  Took metamucil this morning, afterwards vomited metamucil and saliva. Episode lasted 83min where he felt need to continually go to bathroom and vomit small amts fluid/saliva. This has happened in past - 2d ago also had episode of vomiting after eating hot dog and sauercraut. With emesis episode feels physical need to up chuck but no frank nausea with this. No abdominal pain. Notes hiccups.   Also endorses occasional coughing spells with eating. Feels "ball in stomach".  No significant dysphagia otherwise.  Denies significant GERD sxs. No fevers/chills, no early satiety, no unexpected weight loss. No chest pain.  Has taken some nitroglycerin SL which has helped. Today it didn't help and today's episode was longer lasting and more severe than previously.    Relevant past medical, surgical, family and social history reviewed and updated as indicated. Interim medical history since our last visit reviewed. Allergies and medications reviewed and updated. Current Outpatient Prescriptions on File Prior to Visit  Medication Sig  . aspirin EC 81 MG tablet Take 81 mg by mouth daily.  . Clindamycin Phos-Benzoyl Perox (ACANYA) gel Apply topically once a day  . clopidogrel (PLAVIX) 75 MG tablet Take 75 mg by mouth daily with breakfast.  . Coenzyme Q10 (CO Q-10) 100 MG CAPS Take 1 capsule by mouth daily.  Marland Kitchen docusate sodium (COLACE) 100 MG capsule Take 100 mg by mouth 2 (two) times daily.  Marland Kitchen doxazosin (CARDURA) 1 MG tablet TAKE 1 TABLET (1 MG TOTAL) BY MOUTH DAILY.  Marland Kitchen eszopiclone (LUNESTA) 2 MG TABS tablet Take 1 tablet (2 mg total) by mouth at bedtime as needed for  sleep. Take immediately before bedtime  . finasteride (PROSCAR) 5 MG tablet TAKE 1 TABLET BY MOUTH DAILY.  . metFORMIN (GLUCOPHAGE) 500 MG tablet TAKE 1 TABLET (500 MG TOTAL) BY MOUTH 2 (TWO) TIMES DAILY WITH A MEAL.  . metoprolol succinate (TOPROL-XL) 25 MG 24 hr tablet Take 1 tablet (25 mg total) by mouth daily.  . metroNIDAZOLE (METROGEL) 1 % gel Apply topically daily.  . Multiple Vitamin (MULTIVITAMIN) tablet Take 1 tablet by mouth daily.  . nitroGLYCERIN (NITROSTAT) 0.4 MG SL tablet Place 0.4 mg under the tongue every 5 (five) minutes as needed for chest pain.  . pravastatin (PRAVACHOL) 40 MG tablet TAKE 1 TABLET (40 MG TOTAL) BY MOUTH DAILY.  Marland Kitchen Psyllium (METAMUCIL PO) Take by mouth every morning   No current facility-administered medications on file prior to visit.   Past Medical History  Diagnosis Date  . History of basal cell cancer     s/p mohs  . Insomnia     treated with multiple meds in past  . Diabetes type 2, controlled   . BPH (benign prostatic hypertrophy)     w/ nocturia  . Rosacea   . Arthritis of knee   . History of colon polyps   . History of hypertension   . MI (myocardial infarction)   . Skin cancer   . Hypertension   . UTI (urinary tract infection)   . Coronary artery disease 03/2014    Inferior ST  elevation myocardial infarction. Cardiac catheterization showed an occluded mid RCA. He had an angioplasty and drug-eluting stent placement with a 3.0 x 16 mm Promus drug-eluting stent. Ejection fraction was 45% by echo, completed cardiac rehab 06/2014  . HLD (hyperlipidemia)     diet controlled in past    Past Surgical History  Procedure Laterality Date  . Colonoscopy  08/2010    hyperplastic polyp, rec rpt 5 yrs  . Mohs surgery      basal cell chin/back  . Replacement total knee Left 08/2006  . Vasectomy  1970  . Cardiac catheterization  03/2014    [facility];x1 stent   Review of Systems Per HPI unless specifically indicated above     Objective:    BP  116/62 mmHg  Pulse 63  Temp(Src) 97.3 F (36.3 C) (Oral)  Wt 187 lb 4 oz (84.936 kg)  SpO2 97%  Wt Readings from Last 3 Encounters:  01/07/15 187 lb 4 oz (84.936 kg)  12/30/14 191 lb (86.637 kg)  11/29/14 189 lb 8 oz (85.957 kg)    Physical Exam  Constitutional: He appears well-developed and well-nourished. No distress.  HENT:  Mouth/Throat: Oropharynx is clear and moist. No oropharyngeal exudate.  Eyes: Conjunctivae and EOM are normal. Pupils are equal, round, and reactive to light. No scleral icterus.  Neck: Normal range of motion. Neck supple. No thyromegaly present.  Cardiovascular: Normal rate, regular rhythm, normal heart sounds and intact distal pulses.   No murmur heard. Pulmonary/Chest: Effort normal and breath sounds normal. No respiratory distress. He has no wheezes. He has no rales.  Abdominal: Soft. Normal appearance and bowel sounds are normal. He exhibits no distension and no mass. There is no hepatosplenomegaly. There is no tenderness. There is no rigidity, no rebound, no guarding and negative Murphy's sign.  Lymphadenopathy:    He has no cervical adenopathy.  Psychiatric: He has a normal mood and affect.  Nursing note and vitals reviewed.      Assessment & Plan:   Problem List Items Addressed This Visit    Trouble swallowing - Primary    No h/o GERD. Not typical esophageal or oropharyngeal dysphagia. Has never had EGD.  Anticipate more esoph stricture related, ?achalasia. Will refer to GI to discuss further evaluation. Pt and wife agree with plan today. No significant red flags today.      Relevant Orders   Ambulatory referral to Gastroenterology    Other Visit Diagnoses    Non-intractable vomiting without nausea, vomiting of unspecified type        Relevant Orders    Ambulatory referral to Gastroenterology        Follow up plan: Return if symptoms worsen or fail to improve.

## 2015-01-07 NOTE — Progress Notes (Signed)
Pre visit review using our clinic review tool, if applicable. No additional management support is needed unless otherwise documented below in the visit note. 

## 2015-01-10 ENCOUNTER — Other Ambulatory Visit: Payer: Self-pay | Admitting: Family Medicine

## 2015-01-10 DIAGNOSIS — R1310 Dysphagia, unspecified: Secondary | ICD-10-CM | POA: Diagnosis not present

## 2015-01-11 ENCOUNTER — Ambulatory Visit: Payer: Self-pay | Admitting: Unknown Physician Specialty

## 2015-01-11 ENCOUNTER — Other Ambulatory Visit: Payer: Self-pay | Admitting: Cardiovascular Disease

## 2015-01-11 DIAGNOSIS — K449 Diaphragmatic hernia without obstruction or gangrene: Secondary | ICD-10-CM | POA: Diagnosis not present

## 2015-01-25 DIAGNOSIS — R0789 Other chest pain: Secondary | ICD-10-CM | POA: Diagnosis not present

## 2015-01-25 DIAGNOSIS — R933 Abnormal findings on diagnostic imaging of other parts of digestive tract: Secondary | ICD-10-CM | POA: Diagnosis not present

## 2015-01-25 DIAGNOSIS — Z7902 Long term (current) use of antithrombotics/antiplatelets: Secondary | ICD-10-CM | POA: Insufficient documentation

## 2015-01-26 ENCOUNTER — Telehealth: Payer: Self-pay | Admitting: *Deleted

## 2015-01-26 NOTE — Telephone Encounter (Signed)
Request for surgical clearance:  1. What type of surgery is being performed? Year visit with GI Doctor he will be getting (EGD)  2. When is this surgery scheduled? June 15 th   3. Are there any medications that need to be held prior to surgery and how long? Plaxiv for     4. Name of physician performing surgery?  Dr. Nancie Neas  5. What is your office phone and fax number? [place] phone: [phone]  6.

## 2015-01-26 NOTE — Telephone Encounter (Signed)
Pt calling with fax number.  [phone]

## 2015-01-27 NOTE — Telephone Encounter (Signed)
He is at low risk from a cardiac standpoint. Plavix can be held 1 week before.

## 2015-01-31 DIAGNOSIS — R0789 Other chest pain: Secondary | ICD-10-CM | POA: Diagnosis not present

## 2015-01-31 DIAGNOSIS — R079 Chest pain, unspecified: Secondary | ICD-10-CM | POA: Diagnosis not present

## 2015-01-31 DIAGNOSIS — K449 Diaphragmatic hernia without obstruction or gangrene: Secondary | ICD-10-CM | POA: Diagnosis not present

## 2015-01-31 DIAGNOSIS — R933 Abnormal findings on diagnostic imaging of other parts of digestive tract: Secondary | ICD-10-CM | POA: Diagnosis not present

## 2015-01-31 DIAGNOSIS — J8410 Pulmonary fibrosis, unspecified: Secondary | ICD-10-CM | POA: Diagnosis not present

## 2015-02-01 ENCOUNTER — Encounter: Payer: Self-pay | Admitting: *Deleted

## 2015-02-01 NOTE — Telephone Encounter (Signed)
Faxed as requested

## 2015-02-10 ENCOUNTER — Other Ambulatory Visit: Payer: Self-pay | Admitting: Physician Assistant

## 2015-02-10 ENCOUNTER — Telehealth: Payer: Self-pay | Admitting: *Deleted

## 2015-02-10 NOTE — Telephone Encounter (Signed)
Clearance letter typed and faxed to Dr. [name].

## 2015-02-10 NOTE — Telephone Encounter (Signed)
June 2016 is 13 months after his inferior STEMI that occurred in May of 2015. GI MD may discontinue Plavix for 5 days prior to procedure then restart within 24 hours or as soon as possible per GI. Continue aspirin 81 mg daily.

## 2015-02-10 NOTE — Telephone Encounter (Signed)
Nurse from [facility] is calling stating that they need to know when pt can be off his Plavix.  Request for surgical clearance:  1. What type of surgery is being performed? Upper endoscopy EJGD  2. When is this surgery scheduled?  Jun 15 th   3. Are there any medications that need to be held prior to surgery and how long? Needs to know when to stop plavix   4. Name of physician performing surgery? Dr Nancie Neas  5. What is your office phone and fax number? [place]: [phone].

## 2015-02-15 ENCOUNTER — Ambulatory Visit (INDEPENDENT_AMBULATORY_CARE_PROVIDER_SITE_OTHER): Payer: Medicare Other | Admitting: Family Medicine

## 2015-02-15 ENCOUNTER — Encounter: Payer: Self-pay | Admitting: Family Medicine

## 2015-02-15 VITALS — BP 130/70 | HR 60 | Temp 97.5°F | Wt 187.8 lb

## 2015-02-15 DIAGNOSIS — I25119 Atherosclerotic heart disease of native coronary artery with unspecified angina pectoris: Secondary | ICD-10-CM | POA: Diagnosis not present

## 2015-02-15 DIAGNOSIS — J8410 Pulmonary fibrosis, unspecified: Secondary | ICD-10-CM | POA: Insufficient documentation

## 2015-02-15 DIAGNOSIS — K7689 Other specified diseases of liver: Secondary | ICD-10-CM | POA: Diagnosis not present

## 2015-02-15 DIAGNOSIS — R1310 Dysphagia, unspecified: Secondary | ICD-10-CM | POA: Diagnosis not present

## 2015-02-15 DIAGNOSIS — K769 Liver disease, unspecified: Secondary | ICD-10-CM

## 2015-02-15 NOTE — Progress Notes (Signed)
Pre visit review using our clinic review tool, if applicable. No additional management support is needed unless otherwise documented below in the visit note. 

## 2015-02-15 NOTE — Assessment & Plan Note (Signed)
Small 0.9cm low-density lesion on CT. Discussed with patient. After EGD, consider abd [place] for further evaluation.

## 2015-02-15 NOTE — Assessment & Plan Note (Signed)
Found to have large paraesophageal hernia as well as mucosal changes distal esophagus.  Pending EGD with [facility] once able to hold plavix (after 1 yr since recent DES). Discussed this.

## 2015-02-15 NOTE — Progress Notes (Signed)
BP 130/70 mmHg  Pulse 60  Temp(Src) 97.5 F (36.4 C) (Oral)  Wt 187 lb 12 oz (85.163 kg)  SpO2 98%   CC: review test results  Subjective:    Patient ID: REINHARDT LICAUSI, male    DOB: 05-14-1927, 81 y.o.   MRN: [id number]  HPI: ELLEN MAYOL is a 81 y.o. male presenting on 02/15/2015 for Review test results   Here for f/u abnormal CT scan. Last visit here (2/26) endorsed coughing with vomiting and nausea and episodes of dysphagia that lasted 30 min. I referred him to [facility] GI Dr Vira Agar - he ended up being referred to [facility] GI Dr [name] with a large paraesophageal hernia with prominent CPB and mucosal changes distal esophagus. rec EGD in June when he can safely stop plavix.   Chest CT obtained showing scattered mediastinal lymph nodes, biapical pleural scarring and mild centrilobular emphysema, with scattered calcified nodules throughout, bronchiolectasis with LLL 0.4cm nodule (rec rpt CT 12 mo to document stability). Also with incompletely characterized L liver lobe lesion 0..9cm size. Findings suspicious for pulm fibrosis (UIP pattern).  Denies h/o pneumonia. No cough. No h/o TB exposure. No fungal infections, denies significant fume or chemical exposure. Overall very healthy from respiratory standpoint - no recurrent respiratory infections  EGD scheduled for June 15th.   All records reviewed.   Relevant past medical, surgical, family and social history reviewed and updated as indicated. Interim medical history since our last visit reviewed. Allergies and medications reviewed and updated. Current Outpatient Prescriptions on File Prior to Visit  Medication Sig  . aspirin EC 81 MG tablet Take 81 mg by mouth daily.  . Clindamycin Phos-Benzoyl Perox (ACANYA) gel Apply topically once a day  . clopidogrel (PLAVIX) 75 MG tablet Take 75 mg by mouth daily with breakfast.  . Coenzyme Q10 (CO Q-10) 100 MG CAPS Take 1 capsule by mouth daily.  Marland Kitchen docusate sodium (COLACE) 100 MG capsule  Take 100 mg by mouth 2 (two) times daily.  Marland Kitchen doxazosin (CARDURA) 1 MG tablet TAKE 1 TABLET (1 MG TOTAL) BY MOUTH DAILY.  Marland Kitchen eszopiclone (LUNESTA) 2 MG TABS tablet Take 1 tablet (2 mg total) by mouth at bedtime as needed for sleep. Take immediately before bedtime  . finasteride (PROSCAR) 5 MG tablet TAKE 1 TABLET BY MOUTH DAILY.  . metFORMIN (GLUCOPHAGE) 500 MG tablet TAKE 1 TABLET (500 MG TOTAL) BY MOUTH 2 (TWO) TIMES DAILY WITH A MEAL.  . metoprolol succinate (TOPROL-XL) 25 MG 24 hr tablet TAKE 1 TABLET (25 MG TOTAL) BY MOUTH DAILY.  . metroNIDAZOLE (METROGEL) 1 % gel Apply topically daily.  . Multiple Vitamin (MULTIVITAMIN) tablet Take 1 tablet by mouth daily.  . nitroGLYCERIN (NITROSTAT) 0.4 MG SL tablet Place 0.4 mg under the tongue every 5 (five) minutes as needed for chest pain.  . pravastatin (PRAVACHOL) 40 MG tablet TAKE 1 TABLET (40 MG TOTAL) BY MOUTH DAILY.  Marland Kitchen Psyllium (METAMUCIL PO) Take by mouth every morning   No current facility-administered medications on file prior to visit.    Review of Systems Per HPI unless specifically indicated above     Objective:    BP 130/70 mmHg  Pulse 60  Temp(Src) 97.5 F (36.4 C) (Oral)  Wt 187 lb 12 oz (85.163 kg)  SpO2 98%  Wt Readings from Last 3 Encounters:  02/15/15 187 lb 12 oz (85.163 kg)  01/07/15 187 lb 4 oz (84.936 kg)  12/30/14 191 lb (86.637 kg)    Physical Exam  Constitutional: He appears well-developed and well-nourished. No distress.  HENT:  Mouth/Throat: Oropharynx is clear and moist. No oropharyngeal exudate.  Neck: No thyromegaly present.  Cardiovascular: Normal rate, regular rhythm and intact distal pulses.   Murmur (2/6 SEM best at LUSB) heard. Pulmonary/Chest: Effort normal and breath sounds normal. No respiratory distress. He has no wheezes. He has no rales.  Relatively clear lungs  Musculoskeletal: He exhibits no edema.  Lymphadenopathy:    He has no cervical adenopathy.  Skin: Skin is warm and dry. No rash  noted.  Nursing note and vitals reviewed.  Results for orders placed or performed in visit on 12/06/14  Fecal Occult Blood, Guaiac  Result Value Ref Range   Fecal Occult Blood Negative       Assessment & Plan:   Problem List Items Addressed This Visit    Pulmonary interstitial fibrosis    pulm fibrosis + emphysematous changes on recent chest CT. Pt denies any respiratory symptoms or concerns. Given age, requests to defer pulm eval for now. Discussed this is reasonable but to check with anesthesiologist at upcoming preop appt to ensure he doesn't require pulm clearance prior to upcoming EGD LLL 0.4cmg nodule, consider f/u CT 12 mo.      Hepatic lesion    Small 0.9cm low-density lesion on CT. Discussed with patient. After EGD, consider abd [place] for further evaluation.       Dysphagia - Primary    Found to have large paraesophageal hernia as well as mucosal changes distal esophagus.  Pending EGD with [facility] once able to hold plavix (after 1 yr since recent DES). Discussed this.          Follow up plan: No Follow-up on file.

## 2015-02-15 NOTE — Patient Instructions (Signed)
We will get copy of barium swallow to review. O2 level check today. We'll just monitor breathing for now.

## 2015-02-15 NOTE — Assessment & Plan Note (Addendum)
pulm fibrosis + emphysematous changes on recent chest CT. Pt denies any respiratory symptoms or concerns. Given age, requests to defer pulm eval for now. Discussed this is reasonable but to check with anesthesiologist at upcoming preop appt to ensure he doesn't require pulm clearance prior to upcoming EGD LLL 0.4cmg nodule, consider f/u CT 12 mo.

## 2015-03-10 ENCOUNTER — Other Ambulatory Visit: Payer: Self-pay | Admitting: *Deleted

## 2015-03-10 MED ORDER — ASPIRIN EC 81 MG PO TBEC: 81.0000 mg | DELAYED_RELEASE_TABLET | Freq: Every day | ORAL | Status: DC

## 2015-03-10 MED ORDER — CLOPIDOGREL BISULFATE 75 MG PO TABS: 75.0000 mg | ORAL_TABLET | Freq: Every day | ORAL | Status: DC

## 2015-03-11 ENCOUNTER — Encounter: Payer: Self-pay | Admitting: Family Medicine

## 2015-03-11 MED ORDER — CLOPIDOGREL BISULFATE 75 MG PO TABS: 75.0000 mg | ORAL_TABLET | Freq: Every day | ORAL | Status: DC

## 2015-03-29 ENCOUNTER — Other Ambulatory Visit: Payer: Self-pay | Admitting: Family Medicine

## 2015-03-29 NOTE — Telephone Encounter (Signed)
plz phone in. 

## 2015-03-29 NOTE — Telephone Encounter (Signed)
Received refill request electronically from pharmacy. Last refill 11/29/14 #30/3, last office visit 02/15/15. Is it okay to refill medication?

## 2015-03-30 NOTE — Telephone Encounter (Signed)
Rx called in as directed.   

## 2015-04-05 DIAGNOSIS — D044 Carcinoma in situ of skin of scalp and neck: Secondary | ICD-10-CM | POA: Diagnosis not present

## 2015-04-05 DIAGNOSIS — L570 Actinic keratosis: Secondary | ICD-10-CM | POA: Diagnosis not present

## 2015-04-05 DIAGNOSIS — L821 Other seborrheic keratosis: Secondary | ICD-10-CM | POA: Diagnosis not present

## 2015-04-05 DIAGNOSIS — D485 Neoplasm of uncertain behavior of skin: Secondary | ICD-10-CM | POA: Diagnosis not present

## 2015-04-05 DIAGNOSIS — L820 Inflamed seborrheic keratosis: Secondary | ICD-10-CM | POA: Diagnosis not present

## 2015-04-05 DIAGNOSIS — L718 Other rosacea: Secondary | ICD-10-CM | POA: Diagnosis not present

## 2015-04-12 ENCOUNTER — Other Ambulatory Visit: Payer: Self-pay | Admitting: Family Medicine

## 2015-04-19 DIAGNOSIS — I10 Essential (primary) hypertension: Secondary | ICD-10-CM | POA: Diagnosis not present

## 2015-04-19 DIAGNOSIS — R351 Nocturia: Secondary | ICD-10-CM | POA: Diagnosis not present

## 2015-04-19 DIAGNOSIS — R1310 Dysphagia, unspecified: Secondary | ICD-10-CM | POA: Diagnosis not present

## 2015-04-19 DIAGNOSIS — I2119 ST elevation (STEMI) myocardial infarction involving other coronary artery of inferior wall: Secondary | ICD-10-CM | POA: Diagnosis not present

## 2015-04-19 DIAGNOSIS — N401 Enlarged prostate with lower urinary tract symptoms: Secondary | ICD-10-CM | POA: Diagnosis not present

## 2015-04-20 DIAGNOSIS — X32XXXA Exposure to sunlight, initial encounter: Secondary | ICD-10-CM | POA: Diagnosis not present

## 2015-04-20 DIAGNOSIS — L570 Actinic keratosis: Secondary | ICD-10-CM | POA: Diagnosis not present

## 2015-04-27 DIAGNOSIS — K222 Esophageal obstruction: Secondary | ICD-10-CM | POA: Diagnosis not present

## 2015-04-27 DIAGNOSIS — E780 Pure hypercholesterolemia: Secondary | ICD-10-CM | POA: Diagnosis not present

## 2015-04-27 DIAGNOSIS — K319 Disease of stomach and duodenum, unspecified: Secondary | ICD-10-CM | POA: Diagnosis not present

## 2015-04-27 DIAGNOSIS — R933 Abnormal findings on diagnostic imaging of other parts of digestive tract: Secondary | ICD-10-CM | POA: Diagnosis not present

## 2015-04-27 DIAGNOSIS — K259 Gastric ulcer, unspecified as acute or chronic, without hemorrhage or perforation: Secondary | ICD-10-CM | POA: Diagnosis not present

## 2015-04-27 DIAGNOSIS — Q398 Other congenital malformations of esophagus: Secondary | ICD-10-CM | POA: Diagnosis not present

## 2015-04-27 DIAGNOSIS — E119 Type 2 diabetes mellitus without complications: Secondary | ICD-10-CM | POA: Diagnosis not present

## 2015-04-27 DIAGNOSIS — K228 Other specified diseases of esophagus: Secondary | ICD-10-CM | POA: Diagnosis not present

## 2015-04-27 DIAGNOSIS — Z85828 Personal history of other malignant neoplasm of skin: Secondary | ICD-10-CM | POA: Diagnosis not present

## 2015-04-27 DIAGNOSIS — R1310 Dysphagia, unspecified: Secondary | ICD-10-CM | POA: Diagnosis not present

## 2015-04-27 DIAGNOSIS — K449 Diaphragmatic hernia without obstruction or gangrene: Secondary | ICD-10-CM | POA: Diagnosis not present

## 2015-04-27 DIAGNOSIS — I10 Essential (primary) hypertension: Secondary | ICD-10-CM | POA: Diagnosis not present

## 2015-04-27 DIAGNOSIS — R0789 Other chest pain: Secondary | ICD-10-CM | POA: Diagnosis not present

## 2015-04-27 DIAGNOSIS — G4700 Insomnia, unspecified: Secondary | ICD-10-CM | POA: Diagnosis not present

## 2015-05-12 DIAGNOSIS — L905 Scar conditions and fibrosis of skin: Secondary | ICD-10-CM | POA: Diagnosis not present

## 2015-05-12 DIAGNOSIS — D044 Carcinoma in situ of skin of scalp and neck: Secondary | ICD-10-CM | POA: Diagnosis not present

## 2015-05-13 HISTORY — PX: ESOPHAGOGASTRODUODENOSCOPY: SHX1529

## 2015-05-21 ENCOUNTER — Encounter: Payer: Self-pay | Admitting: Family Medicine

## 2015-05-23 ENCOUNTER — Ambulatory Visit (INDEPENDENT_AMBULATORY_CARE_PROVIDER_SITE_OTHER): Payer: Medicare Other | Admitting: Family Medicine

## 2015-05-23 ENCOUNTER — Encounter: Payer: Self-pay | Admitting: Family Medicine

## 2015-05-23 ENCOUNTER — Ambulatory Visit (INDEPENDENT_AMBULATORY_CARE_PROVIDER_SITE_OTHER)
Admission: RE | Admit: 2015-05-23 | Discharge: 2015-05-23 | Disposition: A | Discharge status: Home / Self Care | Payer: Medicare Other | Source: Ambulatory Visit | Attending: Family Medicine | Admitting: Family Medicine

## 2015-05-23 VITALS — BP 114/60 | HR 72 | Temp 97.8°F | Wt 183.8 lb

## 2015-05-23 DIAGNOSIS — M79672 Pain in left foot: Secondary | ICD-10-CM

## 2015-05-23 DIAGNOSIS — K449 Diaphragmatic hernia without obstruction or gangrene: Secondary | ICD-10-CM | POA: Diagnosis not present

## 2015-05-23 DIAGNOSIS — R1310 Dysphagia, unspecified: Secondary | ICD-10-CM | POA: Diagnosis not present

## 2015-05-23 DIAGNOSIS — I25119 Atherosclerotic heart disease of native coronary artery with unspecified angina pectoris: Secondary | ICD-10-CM | POA: Diagnosis not present

## 2015-05-23 DIAGNOSIS — M7732 Calcaneal spur, left foot: Secondary | ICD-10-CM | POA: Diagnosis not present

## 2015-05-23 DIAGNOSIS — M19072 Primary osteoarthritis, left ankle and foot: Secondary | ICD-10-CM | POA: Diagnosis not present

## 2015-05-23 MED ORDER — PANTOPRAZOLE SODIUM 40 MG PO TBEC: 40.0000 mg | DELAYED_RELEASE_TABLET | Freq: Every day | ORAL | Status: DC

## 2015-05-23 NOTE — Assessment & Plan Note (Signed)
Persistent pain since September, worse first few steps after seated.  Xray today to r/o MT stress fx. Pt agrees with plan.

## 2015-05-23 NOTE — Assessment & Plan Note (Signed)
Reviewed workup to date. Start protonix '40mg'$  daily for 1 mo then change to '20mg'$  or priolsec OTC '20mg'$  (if he has come off plavix). Pt agrees with plan. Mild anemia - discussed goal ferrous sulfate '325mg'$  daily.

## 2015-05-23 NOTE — Patient Instructions (Addendum)
Shoot for '325mg'$  of iron daily (65FE). Check multivitamin and if not there, add iron pill (ferrous sulfate) daily for 1 month.  Take prilosec '40mg'$  daily for 1 month then may change to over the counter dose.  Return in 6 months for wellness visit.

## 2015-05-23 NOTE — Assessment & Plan Note (Signed)
Large hiatal hernia (paraesophageal) with cameron lesions. Treat with PPI x1 mo.

## 2015-05-23 NOTE — Progress Notes (Signed)
Pre visit review using our clinic review tool, if applicable. No additional management support is needed unless otherwise documented below in the visit note. 

## 2015-05-23 NOTE — Progress Notes (Signed)
BP 114/60 mmHg  Pulse 72  Temp(Src) 97.8 F (36.6 C) (Oral)  Wt 183 lb 12 oz (83.348 kg)   CC: 6 mo f/u visit  Subjective:    Patient ID: Stephen [name], male    DOB: Jan 10, 1927, 81 y.o.   MRN: [id number]  HPI: Stephen [name] is a 81 y.o. male presenting on 05/23/2015 for Follow-up   See prior note for details.  Dysphagia - ESOPHAGOGASTRODUODENOSCOPY Date: 05/2015 dilated stricture, normal biopsies, no definite infection [name] @ [facility]). Started on prilosec. Asks about iron daily. Hgb dropped to 11.8, then up to 12.8. Discussed this. Records from Care Everywhere reviewed.   Recent squamous cell cancer removed by derm, returns later today for stitches removal.   Relevant past medical, surgical, family and social history reviewed and updated as indicated. Interim medical history since our last visit reviewed. Allergies and medications reviewed and updated. Current Outpatient Prescriptions on File Prior to Visit  Medication Sig  . aspirin EC 81 MG tablet Take 1 tablet (81 mg total) by mouth daily.  . Clindamycin Phos-Benzoyl Perox (ACANYA) gel Apply topically once a day  . clopidogrel (PLAVIX) 75 MG tablet Take 1 tablet (75 mg total) by mouth daily with breakfast.  . Coenzyme Q10 (CO Q-10) 100 MG CAPS Take 1 capsule by mouth daily.  Marland Kitchen doxazosin (CARDURA) 1 MG tablet TAKE 1 TABLET BY MOUTH EVERY DAY  . eszopiclone (LUNESTA) 2 MG TABS tablet TAKE 1 TABLET BY MOUTH AT BEDTIME AS NEEDED FOR SLEEP  . finasteride (PROSCAR) 5 MG tablet TAKE 1 TABLET BY MOUTH DAILY.  . metFORMIN (GLUCOPHAGE) 500 MG tablet TAKE 1 TABLET (500 MG TOTAL) BY MOUTH 2 (TWO) TIMES DAILY WITH A MEAL.  . metoprolol succinate (TOPROL-XL) 25 MG 24 hr tablet TAKE 1 TABLET (25 MG TOTAL) BY MOUTH DAILY.  . metroNIDAZOLE (METROGEL) 1 % gel Apply topically daily.  . Multiple Vitamin (MULTIVITAMIN) tablet Take 1 tablet by mouth daily.  . nitroGLYCERIN (NITROSTAT) 0.4 MG SL tablet Place 0.4 mg under the tongue every 5  (five) minutes as needed for chest pain.  . pravastatin (PRAVACHOL) 40 MG tablet TAKE 1 TABLET (40 MG TOTAL) BY MOUTH DAILY.  Marland Kitchen Psyllium (METAMUCIL PO) Take by mouth every morning   No current facility-administered medications on file prior to visit.    Review of Systems Per HPI unless specifically indicated above     Objective:    BP 114/60 mmHg  Pulse 72  Temp(Src) 97.8 F (36.6 C) (Oral)  Wt 183 lb 12 oz (83.348 kg)  Wt Readings from Last 3 Encounters:  05/23/15 183 lb 12 oz (83.348 kg)  02/15/15 187 lb 12 oz (85.163 kg)  01/07/15 187 lb 4 oz (84.936 kg)    Physical Exam  Constitutional: He appears well-developed and well-nourished. No distress.  HENT:  Mouth/Throat: Oropharynx is clear and moist. No oropharyngeal exudate.  Cardiovascular: Normal rate, regular rhythm and intact distal pulses.   Murmur (3/6 SEM RUSB) heard. Pulmonary/Chest: Effort normal and breath sounds normal. No respiratory distress. He has no wheezes. He has no rales.  Musculoskeletal: He exhibits no edema.  Tender to palpation at mid 4th MT on left  Psychiatric: He has a normal mood and affect.  Nursing note and vitals reviewed.  Results for orders placed or performed in visit on 12/06/14  Fecal Occult Blood, Guaiac  Result Value Ref Range   Fecal Occult Blood Negative       Assessment & Plan:   Problem  List Items Addressed This Visit    Dysphagia    Reviewed workup to date. Start protonix '40mg'$  daily for 1 mo then change to '20mg'$  or priolsec OTC '20mg'$  (if he has come off plavix). Pt agrees with plan. Mild anemia - discussed goal ferrous sulfate '325mg'$  daily.      Hiatal hernia    Large hiatal hernia (paraesophageal) with cameron lesions. Treat with PPI x1 mo.      Left foot pain - Primary    Persistent pain since September, worse first few steps after seated.  Xray today to r/o MT stress fx. Pt agrees with plan.      Relevant Orders   DG Foot Complete Left       Follow up  plan: Return in about 6 months (around 11/23/2015), or as needed, for medicare wellness.

## 2015-06-27 ENCOUNTER — Other Ambulatory Visit: Payer: Self-pay | Admitting: Family Medicine

## 2015-06-28 ENCOUNTER — Other Ambulatory Visit: Payer: Self-pay | Admitting: Family Medicine

## 2015-07-11 ENCOUNTER — Encounter: Payer: Self-pay | Admitting: Cardiovascular Disease

## 2015-07-11 ENCOUNTER — Ambulatory Visit (INDEPENDENT_AMBULATORY_CARE_PROVIDER_SITE_OTHER): Payer: Medicare Other | Admitting: Cardiovascular Disease

## 2015-07-11 VITALS — BP 110/68 | HR 68 | Ht 68.0 in | Wt 183.0 lb

## 2015-07-11 DIAGNOSIS — I25119 Atherosclerotic heart disease of native coronary artery with unspecified angina pectoris: Secondary | ICD-10-CM | POA: Diagnosis not present

## 2015-07-11 DIAGNOSIS — E785 Hyperlipidemia, unspecified: Secondary | ICD-10-CM

## 2015-07-11 NOTE — Patient Instructions (Signed)
Medication Instructions:  Your physician has recommended you make the following change in your medication:  STOP taking Plavix   Labwork: none  Testing/Procedures: none  Follow-Up: Your physician wants you to follow-up in: 9 months with Dr. Fletcher Anon.  You will receive a reminder letter in the mail two months in advance. If you don't receive a letter, please call our office to schedule the follow-up appointment.   Any Other Special Instructions Will Be Listed Below (If Applicable).

## 2015-07-11 NOTE — Progress Notes (Signed)
Primary care physician: Dr. Danise Mina  HPI  This is a pleasant 81 year old man who is here today for a followup visit regarding coronary artery disease. He presented in May, 2015 to [facility] with acute onset of chest pain and was found to have inferior ST elevation on his EKG. He underwent emergent cardiac catheterization which showed an occluded mid RCA. He had successful angioplasty and drug-eluting stent placement. Ejection fraction was 45%. He has chronic medical conditions that include type 2 diabetes, hypertension and hyperlipidemia. He is a vegetarian since age 26 and lives at [facility] [place] [facility]. He has been doing well and denies any chest pain or shortness of breath. He didn't tolerate Atorvastatin to myalgia.  He reports having dysphagia which has been evaluated. EGD revealed hiatal hernia.  Allergies  Allergen Reactions  . Atorvastatin     muscle aches  . Belsomra [Suvorexant] Other (See Comments)    anxiety  . Penicillins Hives  . Doxycycline Rash     Current Outpatient Prescriptions on File Prior to Visit  Medication Sig Dispense Refill  . aspirin 81 MG EC tablet TAKE 1 TABLET (81 MG TOTAL) BY MOUTH DAILY. 30 tablet 3  . Clindamycin Phos-Benzoyl Perox (ACANYA) gel Apply topically once a day    . clopidogrel (PLAVIX) 75 MG tablet Take 1 tablet (75 mg total) by mouth daily with breakfast. 30 tablet 3  . Coenzyme Q10 (CO Q-10) 100 MG CAPS Take 1 capsule by mouth daily.    Marland Kitchen doxazosin (CARDURA) 1 MG tablet TAKE 1 TABLET BY MOUTH EVERY DAY 30 tablet 3  . eszopiclone (LUNESTA) 2 MG TABS tablet TAKE 1 TABLET BY MOUTH AT BEDTIME AS NEEDED FOR SLEEP 30 tablet 3  . finasteride (PROSCAR) 5 MG tablet TAKE 1 TABLET BY MOUTH DAILY. 30 tablet 3  . metFORMIN (GLUCOPHAGE) 500 MG tablet TAKE 1 TABLET (500 MG TOTAL) BY MOUTH 2 (TWO) TIMES DAILY WITH A MEAL. 180 tablet 3  . metoprolol succinate (TOPROL-XL) 25 MG 24 hr tablet TAKE 1 TABLET (25 MG TOTAL) BY MOUTH DAILY. 90 tablet 3   . metroNIDAZOLE (METROGEL) 1 % gel Apply topically daily.    . Multiple Vitamin (MULTIVITAMIN) tablet Take 1 tablet by mouth daily.    . nitroGLYCERIN (NITROSTAT) 0.4 MG SL tablet Place 0.4 mg under the tongue every 5 (five) minutes as needed for chest pain.    . pantoprazole (PROTONIX) 40 MG tablet Take 1 tablet (40 mg total) by mouth daily. 30 tablet 1  . pravastatin (PRAVACHOL) 40 MG tablet TAKE 1 TABLET (40 MG TOTAL) BY MOUTH DAILY. 30 tablet 5  . Psyllium (METAMUCIL PO) Take by mouth every morning     No current facility-administered medications on file prior to visit.     Past Medical History  Diagnosis Date  . History of basal cell cancer     s/p mohs  . Insomnia     treated with multiple meds in past  . Diabetes type 2, controlled   . BPH (benign prostatic hypertrophy)     w/ nocturia  . Rosacea   . Arthritis of knee   . History of colon polyps   . History of hypertension   . MI (myocardial infarction)   . Skin cancer   . Hypertension   . UTI (urinary tract infection)   . Coronary artery disease 03/2014    Inferior ST elevation myocardial infarction. Cardiac catheterization showed an occluded mid RCA. He had an angioplasty and drug-eluting stent placement with  a 3.0 x 16 mm Promus drug-eluting stent. Ejection fraction was 45% by echo, completed cardiac rehab 06/2014  . HLD (hyperlipidemia)     diet controlled in past     Past Surgical History  Procedure Laterality Date  . Colonoscopy  08/2010    hyperplastic polyp, rec rpt 5 yrs  . Mohs surgery      basal cell chin/back  . Replacement total knee Left 08/2006  . Vasectomy  1970  . Cardiac catheterization  03/2014    [facility];x1 stent  . Esophagogastroduodenoscopy  05/2015    dilated stricture, normal biopsies, HH, no definite infection [name] @ [facility])     Family History  Problem Relation Age of Onset  . Cancer Father 2    prostate and colon  . CAD Brother 30    MI, smoker  . Parkinson's disease Brother   .  COPD Brother   . CAD Brother     MI  . Cancer Mother     ovarian or uterine  . Stroke Sister      Social History   Social History  . Marital Status: Widowed    Spouse Name: N/A  . Number of Children: N/A  . Years of Education: N/A   Occupational History  . Not on file.   Social History Main Topics  . Smoking status: Former Smoker    Quit date: 11/12/1962  . Smokeless tobacco: Never Used     Comment: Quit 1964  . Alcohol Use: Yes     Comment: 1 beer/day  . Drug Use: No  . Sexual Activity: Not on file   Other Topics Concern  . Not on file   Social History Narrative   Lives at [facility] with friend - Osvaldo Angst RN.   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 Vegetarian.     ROS A 10 point review of system was performed. It is negative other than that mentioned in the history of present illness.   PHYSICAL EXAM   BP 110/68 mmHg  Pulse 68  Ht '5\' 8"'$  (1.727 m)  Wt 183 lb (83.008 kg)  BMI 27.83 kg/m2 Constitutional: He is oriented to person, place, and time. He appears well-developed and well-nourished. No distress.  HENT: No nasal discharge.  Head: Normocephalic and atraumatic.  Eyes: Pupils are equal and round.  No discharge. Neck: Normal range of motion. Neck supple. No JVD present. No thyromegaly present.  Cardiovascular: Normal rate, regular rhythm, normal heart sounds. Exam reveals no gallop and no friction rub. No murmur heard.  Pulmonary/Chest: Effort normal and breath sounds normal. No stridor. No respiratory distress. He has no wheezes. He has no rales. He exhibits no tenderness.  Abdominal: Soft. Bowel sounds are normal. He exhibits no distension. There is no tenderness. There is no rebound and no guarding.  Musculoskeletal: Normal range of motion. He exhibits no edema and no tenderness.   Neurological: He is alert and oriented to person, place, and time. Coordination normal.  Skin: Skin is warm and dry. No rash noted. He is not diaphoretic. No erythema. No pallor.  Psychiatric: He has a normal mood and affect. His behavior is normal. Judgment and thought content normal.     XKG:YJEHU  Rhythm  Low voltage in precordial leads.   -Inferior infarct -probably not recent.   ABNORMAL  ASSESSMENT AND PLAN

## 2015-07-12 NOTE — Assessment & Plan Note (Signed)
He is doing very well at the present time with no symptoms suggestive of angina. Continue medical therapy. It has been more than one year since his myocardial infarction and stent placement. The patient prefers not to be in dual antiplatelets therapy and thus I discontinued Plavix a day. Continue indefinite treatment with low-dose aspirin.

## 2015-07-12 NOTE — Assessment & Plan Note (Signed)
Lab Results  Component Value Date   CHOL 146 11/22/2014   HDL 48.30 11/22/2014   LDLCALC 70 11/22/2014   TRIG 141.0 11/22/2014   CHOLHDL 3 11/22/2014   LDL is at target with pravastatin 40 mg daily. He did not tolerate atorvastatin due to myalgia. 

## 2015-07-17 ENCOUNTER — Other Ambulatory Visit: Payer: Self-pay | Admitting: Family Medicine

## 2015-07-21 ENCOUNTER — Encounter: Payer: Self-pay | Admitting: Family Medicine

## 2015-07-21 DIAGNOSIS — E119 Type 2 diabetes mellitus without complications: Secondary | ICD-10-CM | POA: Diagnosis not present

## 2015-07-21 LAB — HM DIABETES EYE EXAM

## 2015-08-01 DIAGNOSIS — X32XXXA Exposure to sunlight, initial encounter: Secondary | ICD-10-CM | POA: Diagnosis not present

## 2015-08-01 DIAGNOSIS — L570 Actinic keratosis: Secondary | ICD-10-CM | POA: Diagnosis not present

## 2015-08-01 DIAGNOSIS — L821 Other seborrheic keratosis: Secondary | ICD-10-CM | POA: Diagnosis not present

## 2015-08-01 DIAGNOSIS — Z85828 Personal history of other malignant neoplasm of skin: Secondary | ICD-10-CM | POA: Diagnosis not present

## 2015-08-02 ENCOUNTER — Other Ambulatory Visit: Payer: Self-pay | Admitting: Family Medicine

## 2015-08-02 NOTE — Telephone Encounter (Signed)
plz phone in. 

## 2015-08-02 NOTE — Telephone Encounter (Signed)
Rx called in as directed.   

## 2015-08-02 NOTE — Telephone Encounter (Signed)
Ok to refill 

## 2015-08-16 ENCOUNTER — Other Ambulatory Visit: Payer: Self-pay | Admitting: Family Medicine

## 2015-08-19 ENCOUNTER — Other Ambulatory Visit: Payer: Self-pay | Admitting: Family Medicine

## 2015-08-24 ENCOUNTER — Telehealth: Payer: Self-pay | Admitting: *Deleted

## 2015-08-24 NOTE — Telephone Encounter (Signed)
PA required for pantoprazole. In your IN box for completion.

## 2015-08-25 NOTE — Telephone Encounter (Signed)
Filled and in Kim's box. 

## 2015-08-25 NOTE — Telephone Encounter (Signed)
PA faxed. Will await determination.

## 2015-09-02 NOTE — Telephone Encounter (Signed)
PA approved. Pharmacy notified 

## 2015-09-12 ENCOUNTER — Ambulatory Visit (INDEPENDENT_AMBULATORY_CARE_PROVIDER_SITE_OTHER): Payer: Medicare Other

## 2015-09-12 DIAGNOSIS — Z23 Encounter for immunization: Secondary | ICD-10-CM

## 2015-09-24 ENCOUNTER — Other Ambulatory Visit: Payer: Self-pay | Admitting: Family Medicine

## 2015-10-26 ENCOUNTER — Other Ambulatory Visit: Payer: Self-pay | Admitting: *Deleted

## 2015-10-26 MED ORDER — PRAVASTATIN SODIUM 40 MG PO TABS: 40.0000 mg | ORAL_TABLET | Freq: Every day | ORAL | Status: DC

## 2015-10-28 ENCOUNTER — Other Ambulatory Visit: Payer: Self-pay | Admitting: Family Medicine

## 2015-11-20 ENCOUNTER — Other Ambulatory Visit: Payer: Self-pay | Admitting: Family Medicine

## 2015-11-28 ENCOUNTER — Other Ambulatory Visit: Payer: Self-pay | Admitting: Family Medicine

## 2015-11-28 NOTE — Telephone Encounter (Signed)
Pt called requesting refill metformin.[facility] said they did not get electronic submission of metformin in 10/2015. Medication phoned to [facility] pharmacy as instructed. Pt will ck with pharmacy.

## 2015-11-29 ENCOUNTER — Other Ambulatory Visit: Payer: Self-pay | Admitting: Family Medicine

## 2015-12-05 ENCOUNTER — Other Ambulatory Visit: Payer: Self-pay | Admitting: Family Medicine

## 2015-12-05 ENCOUNTER — Other Ambulatory Visit (INDEPENDENT_AMBULATORY_CARE_PROVIDER_SITE_OTHER): Payer: Medicare Other

## 2015-12-05 DIAGNOSIS — E119 Type 2 diabetes mellitus without complications: Secondary | ICD-10-CM

## 2015-12-05 DIAGNOSIS — K769 Liver disease, unspecified: Secondary | ICD-10-CM

## 2015-12-05 DIAGNOSIS — E785 Hyperlipidemia, unspecified: Secondary | ICD-10-CM | POA: Diagnosis not present

## 2015-12-05 DIAGNOSIS — K7689 Other specified diseases of liver: Secondary | ICD-10-CM

## 2015-12-05 LAB — LIPID PANEL
CHOL/HDL RATIO: 3
Cholesterol: 150 mg/dL (ref 0–200)
HDL: 52.9 mg/dL (ref >39.00)
LDL Cholesterol: 71 mg/dL (ref 0–99)
NONHDL: 97.15
Triglycerides: 133 mg/dL (ref 0.0–149.0)
VLDL: 26.6 mg/dL (ref 0.0–40.0)

## 2015-12-05 LAB — COMPREHENSIVE METABOLIC PANEL
ALBUMIN: 4 g/dL (ref 3.5–5.2)
ALK PHOS: 53 U/L (ref 39–117)
ALT: 12 U/L (ref 0–53)
AST: 21 U/L (ref 0–37)
BUN: 15 mg/dL (ref 6–23)
CALCIUM: 9.3 mg/dL (ref 8.4–10.5)
CHLORIDE: 101 meq/L (ref 96–112)
CO2: 29 mEq/L (ref 19–32)
Creatinine, Ser: 1.09 mg/dL (ref 0.40–1.50)
GFR: 68.14 mL/min (ref >60.00)
Glucose, Bld: 102 mg/dL — ABNORMAL HIGH (ref 70–99)
POTASSIUM: 4.4 meq/L (ref 3.5–5.1)
SODIUM: 137 meq/L (ref 135–145)
TOTAL PROTEIN: 6.9 g/dL (ref 6.0–8.3)
Total Bilirubin: 0.7 mg/dL (ref 0.2–1.2)

## 2015-12-05 LAB — MICROALBUMIN / CREATININE URINE RATIO
CREATININE, U: 94.3 mg/dL
Microalb Creat Ratio: 1.4 mg/g (ref 0.0–30.0)
Microalb, Ur: 1.3 mg/dL (ref 0.0–1.9)

## 2015-12-05 LAB — HEMOGLOBIN A1C: Hgb A1c MFr Bld: 6.3 % (ref 4.6–6.5)

## 2015-12-06 ENCOUNTER — Other Ambulatory Visit: Payer: Self-pay | Admitting: Family Medicine

## 2015-12-06 MED ORDER — ESZOPICLONE 2 MG PO TABS: 2.0000 mg | ORAL_TABLET | Freq: Every day | ORAL | Status: DC

## 2015-12-06 NOTE — Telephone Encounter (Signed)
Ok to refill 

## 2015-12-06 NOTE — Telephone Encounter (Signed)
plz phone in w/ 3 refills.

## 2015-12-07 NOTE — Telephone Encounter (Signed)
Rx called in as directed.   

## 2015-12-09 ENCOUNTER — Other Ambulatory Visit: Payer: Self-pay | Admitting: Family Medicine

## 2015-12-12 ENCOUNTER — Encounter: Payer: Self-pay | Admitting: Family Medicine

## 2015-12-12 ENCOUNTER — Ambulatory Visit (INDEPENDENT_AMBULATORY_CARE_PROVIDER_SITE_OTHER): Payer: Medicare Other | Admitting: Family Medicine

## 2015-12-12 VITALS — BP 124/60 | HR 68 | Temp 97.5°F | Wt 186.0 lb

## 2015-12-12 DIAGNOSIS — N400 Enlarged prostate without lower urinary tract symptoms: Secondary | ICD-10-CM | POA: Diagnosis not present

## 2015-12-12 DIAGNOSIS — Z0000 Encounter for general adult medical examination without abnormal findings: Secondary | ICD-10-CM

## 2015-12-12 DIAGNOSIS — E119 Type 2 diabetes mellitus without complications: Secondary | ICD-10-CM

## 2015-12-12 DIAGNOSIS — I25119 Atherosclerotic heart disease of native coronary artery with unspecified angina pectoris: Secondary | ICD-10-CM | POA: Diagnosis not present

## 2015-12-12 DIAGNOSIS — D649 Anemia, unspecified: Secondary | ICD-10-CM | POA: Insufficient documentation

## 2015-12-12 DIAGNOSIS — Z7189 Other specified counseling: Secondary | ICD-10-CM

## 2015-12-12 DIAGNOSIS — Z79899 Other long term (current) drug therapy: Secondary | ICD-10-CM | POA: Diagnosis not present

## 2015-12-12 DIAGNOSIS — R202 Paresthesia of skin: Secondary | ICD-10-CM | POA: Insufficient documentation

## 2015-12-12 DIAGNOSIS — E785 Hyperlipidemia, unspecified: Secondary | ICD-10-CM

## 2015-12-12 DIAGNOSIS — G4700 Insomnia, unspecified: Secondary | ICD-10-CM

## 2015-12-12 LAB — CBC WITH DIFFERENTIAL/PLATELET
BASOS PCT: 0.6 % (ref 0.0–3.0)
Basophils Absolute: 0 10*3/uL (ref 0.0–0.1)
EOS PCT: 2.4 % (ref 0.0–5.0)
Eosinophils Absolute: 0.2 10*3/uL (ref 0.0–0.7)
HCT: 45.3 % (ref 39.0–52.0)
Hemoglobin: 15 g/dL (ref 13.0–17.0)
LYMPHS PCT: 24.8 % (ref 12.0–46.0)
Lymphs Abs: 1.9 10*3/uL (ref 0.7–4.0)
MCHC: 33.1 g/dL (ref 30.0–36.0)
MCV: 97.6 fl (ref 78.0–100.0)
Monocytes Absolute: 0.8 10*3/uL (ref 0.1–1.0)
Monocytes Relative: 10.4 % (ref 3.0–12.0)
NEUTROS PCT: 61.8 % (ref 43.0–77.0)
Neutro Abs: 4.7 10*3/uL (ref 1.4–7.7)
PLATELETS: 275 10*3/uL (ref 150.0–400.0)
RBC: 4.65 Mil/uL (ref 4.22–5.81)
RDW: 14 % (ref 11.5–15.5)
WBC: 7.6 10*3/uL (ref 4.0–10.5)

## 2015-12-12 LAB — FOLATE: Folate: >23.6 ng/mL (ref >5.9)

## 2015-12-12 LAB — IBC PANEL
Iron: 132 ug/dL (ref 42–165)
Saturation Ratios: 35.3 % (ref 20.0–50.0)
Transferrin: 267 mg/dL (ref 212.0–360.0)

## 2015-12-12 LAB — FERRITIN: Ferritin: 26.4 ng/mL (ref 22.0–322.0)

## 2015-12-12 LAB — VITAMIN B12: Vitamin B-12: 625 pg/mL (ref 211–911)

## 2015-12-12 LAB — TSH: TSH: 1.53 u[IU]/mL (ref 0.35–4.50)

## 2015-12-12 MED ORDER — OMEPRAZOLE 20 MG PO CPDR: 20.0000 mg | DELAYED_RELEASE_CAPSULE | Freq: Every day | ORAL | Status: DC

## 2015-12-12 MED ORDER — DOXEPIN HCL 3 MG PO TABS: 1.0000 | ORAL_TABLET | Freq: Every evening | ORAL | Status: DC | PRN

## 2015-12-12 NOTE — Assessment & Plan Note (Signed)
Chronic, stable. Continue metformin.  

## 2015-12-12 NOTE — Assessment & Plan Note (Signed)
Check labs - TSH, B12, folate

## 2015-12-12 NOTE — Assessment & Plan Note (Signed)
Advanced directive in chart 03/2014: HCPOA - Osvaldo Angst.

## 2015-12-12 NOTE — Telephone Encounter (Signed)
Changed to Stephen [name] at [place] today.

## 2015-12-12 NOTE — Progress Notes (Signed)
Pre visit review using our clinic review tool, if applicable. No additional management support is needed unless otherwise documented below in the visit note. 

## 2015-12-12 NOTE — Telephone Encounter (Signed)
plz phone in. 

## 2015-12-12 NOTE — Assessment & Plan Note (Signed)
Longstanding. Reviewed sleep hygiene. rec against white light at bedtime (uses ipad) Trial silenor. Tried and failed other meds

## 2015-12-12 NOTE — Assessment & Plan Note (Signed)
Check labs again today - h/o this last year thought related to EGD findings.

## 2015-12-12 NOTE — Assessment & Plan Note (Signed)
Stable, asxs. Recently plavix stopped. Appreciate cardiology care.

## 2015-12-12 NOTE — Progress Notes (Signed)
BP 124/60 mmHg  Pulse 68  Temp(Src) 97.5 F (36.4 C) (Oral)  Wt 186 lb (84.369 kg)   CC: medicare wellness visit  Subjective:    Patient ID: Stephen [name], male    DOB: 01-Oct-1927, 82 y.o.   MRN: [id number]  HPI: Stephen [name] is a 82 y.o. male presenting on 12/12/2015 for Annual Exam   Noticing tingling R dorsal hand and foot for last 6 months. Noticed at night time. No neck pain or weakness. Energy level down but still enjoys golf.  No results found for: VITAMINB12  Chronic insomnia - ambien and ambien CR ineffective. Belsomra caused side effects. Melatonin and rozerem ineffective as well. Tried trazodone which was more activating. Ativan was somewhat helpful last night. Would consider temazepam or clonazepam. Does take daytime naps.   EGD showing large paraesophageal HH with cameron lesions, on PPI protonix 37m daily.  Vision screen with eye doctor. Deferred hearing screen 2/2 hearing aide use. No falls or anhedonia, depression, sadness.  Preventative: Colonoscopy done 2011 would be due for rpt 2016 [facility], would start with stool kit.  Prostate - aged out Flu yearly Pneumvax 2012, prevnar 2016 Td 05/2008  zostavax 05/2008  Advanced directive in chart 03/2014: HCPOA - KOsvaldo Angst  Seat belt use discussed Sunscreen use discussed. No changing moles on skin. Known h/o rosacea.  [facility] - KOsvaldo AngstRN.  Widower, wife of 40+ yrs passed away from colon cancer  Occupation - worked for [employer] [place]  Edu: BS  Activity: [place], [place], vegetarian   Relevant past medical, surgical, family and social history reviewed and updated as indicated. Interim medical history since our last visit reviewed. Allergies and medications reviewed and updated. Current Outpatient Prescriptions on File Prior to Visit  Medication Sig  . aspirin 81  MG EC tablet TAKE 1 TABLET (81 MG TOTAL) BY MOUTH DAILY.  .Marland KitchenClindamycin Phos-Benzoyl Perox (ACANYA) gel Apply topically once a day  . doxazosin (CARDURA) 1 MG tablet TAKE 1 TABLET BY MOUTH EVERY DAY  . eszopiclone (LUNESTA) 2 MG TABS tablet TAKE 1 TABLET BY MOUTH AT BEDTIME  . finasteride (PROSCAR) 5 MG tablet TAKE 1 TABLET BY MOUTH DAILY.  . metFORMIN (GLUCOPHAGE) 500 MG tablet TAKE 1 TABLET (500 MG TOTAL) BY MOUTH 2 (TWO) TIMES DAILY WITH A MEAL.  . metoprolol succinate (TOPROL-XL) 25 MG 24 hr tablet TAKE 1 TABLET (25 MG TOTAL) BY MOUTH DAILY.  . metroNIDAZOLE (METROGEL) 1 % gel Apply topically daily.  . Multiple Vitamin (MULTIVITAMIN) tablet Take 1 tablet by mouth daily.  . nitroGLYCERIN (NITROSTAT) 0.4 MG SL tablet Place 0.4 mg under the tongue every 5 (five) minutes as needed for chest pain.  . pravastatin (PRAVACHOL) 40 MG tablet Take 1 tablet (40 mg total) by mouth daily.  . Psyllium (METAMUCIL PO) Take by mouth every morning   No current facility-administered medications on file prior to visit.    Review of Systems Per HPI unless specifically indicated in ROS section     Objective:    BP 124/60 mmHg  Pulse 68  Temp(Src) 97.5 F (36.4 C) (Oral)  Wt 186 lb (84.369 kg)  Wt Readings from Last 3 Encounters:  12/12/15 186 lb (84.369 kg)  07/11/15 183 lb (83.008 kg)  05/23/15 183 lb 12 oz (83.348 kg)    Physical Exam  Constitutional: He is oriented to person,  place, and time. He appears well-developed and well-nourished. No distress.  HENT:  Head: Normocephalic and atraumatic.  Right Ear: Hearing, tympanic membrane, external ear and ear canal normal.  Left Ear: Hearing, tympanic membrane, external ear and ear canal normal.  Nose: Nose normal.  Mouth/Throat: Uvula is midline, oropharynx is clear and moist and mucous membranes are normal. No oropharyngeal exudate, posterior oropharyngeal edema or posterior oropharyngeal erythema.  Eyes: Conjunctivae and EOM are normal. Pupils  are equal, round, and reactive to light. No scleral icterus.  Neck: Normal range of motion. Neck supple. Carotid bruit is not present. No thyromegaly present.  Cardiovascular: Normal rate, regular rhythm, normal heart sounds and intact distal pulses.   No murmur heard. Pulses:      Radial pulses are 2+ on the right side, and 2+ on the left side.  Pulmonary/Chest: Effort normal and breath sounds normal. No respiratory distress. He has no wheezes. He has no rales.  Abdominal: Soft. Bowel sounds are normal. He exhibits no distension and no mass. There is no tenderness. There is no rebound and no guarding.  Musculoskeletal: Normal range of motion. He exhibits no edema.  Lymphadenopathy:    He has no cervical adenopathy.  Neurological: He is alert and oriented to person, place, and time.  CN grossly intact, station and gait intact Recall 3/3 Calculation 5/5 serial 7s  Skin: Skin is warm and dry. No rash noted.  Psychiatric: He has a normal mood and affect. His behavior is normal. Judgment and thought content normal.  Nursing note and vitals reviewed.  Results for orders placed or performed in visit on 12/05/15  Lipid panel  Result Value Ref Range   Cholesterol 150 0 - 200 mg/dL   Triglycerides 133.0 0.0 - 149.0 mg/dL   HDL 52.90 >39.00 mg/dL   VLDL 26.6 0.0 - 40.0 mg/dL   LDL Cholesterol 71 0 - 99 mg/dL   Total CHOL/HDL Ratio 3    NonHDL 97.15   Hemoglobin A1c  Result Value Ref Range   Hgb A1c MFr Bld 6.3 4.6 - 6.5 %  Microalbumin / creatinine urine ratio  Result Value Ref Range   Microalb, Ur 1.3 0.0 - 1.9 mg/dL   Creatinine,U 94.3 mg/dL   Microalb Creat Ratio 1.4 0.0 - 30.0 mg/g  Comprehensive metabolic panel  Result Value Ref Range   Sodium 137 135 - 145 mEq/L   Potassium 4.4 3.5 - 5.1 mEq/L   Chloride 101 96 - 112 mEq/L   CO2 29 19 - 32 mEq/L   Glucose, Bld 102 (H) 70 - 99 mg/dL   BUN 15 6 - 23 mg/dL   Creatinine, Ser 1.09 0.40 - 1.50 mg/dL   Total Bilirubin 0.7 0.2 - 1.2  mg/dL   Alkaline Phosphatase 53 39 - 117 U/L   AST 21 0 - 37 U/L   ALT 12 0 - 53 U/L   Total Protein 6.9 6.0 - 8.3 g/dL   Albumin 4.0 3.5 - 5.2 g/dL   Calcium 9.3 8.4 - 10.5 mg/dL   GFR 68.14 >60.00 mL/min      Assessment & Plan:   Problem List Items Addressed This Visit    Paresthesias    Check labs - TSH, B12, folate      Relevant Orders   Folate   Vitamin B12   TSH   Medicare annual wellness visit, subsequent - Primary    I have personally reviewed the Medicare Annual Wellness questionnaire and have noted 1. The patient's medical and  social history 2. Their use of alcohol, tobacco or illicit drugs 3. Their current medications and supplements 4. The patient's functional ability including ADL's, fall risks, home safety risks and hearing or visual impairment. Cognitive function has been assessed and addressed as indicated.  5. Diet and physical activity 6. Evidence for depression or mood disorders The patients weight, height, BMI have been recorded in the chart. I have made referrals, counseling and provided education to the patient based on review of the above and I have provided the pt with a written personalized care plan for preventive services. Provider list updated.. See scanned questionairre as needed for further documentation. Reviewed preventative protocols and updated unless pt declined.       Insomnia    Longstanding. Reviewed sleep hygiene. rec against white light at bedtime (uses ipad) Trial silenor. Tried and failed other meds       HLD (hyperlipidemia)    Chronic, stable. Continue current regimen.      Diabetes type 2, controlled (HCC)    Chronic, stable. Continue metformin.      Coronary artery disease    Stable, asxs. Recently plavix stopped. Appreciate cardiology care.      BPH (benign prostatic hypertrophy)    Chronic, stable. Continue cardura and proscar.       Anemia, unspecified    Check labs again today - h/o this last year thought  related to EGD findings.       Advanced care planning/counseling discussion    Advanced directive in chart 03/2014: HCPOA - Osvaldo Angst.           Follow up plan: Return in about 1 year (around 12/11/2016), or as needed, for medicare wellness visit.

## 2015-12-12 NOTE — Assessment & Plan Note (Signed)

## 2015-12-12 NOTE — Addendum Note (Signed)
Addended by: Marchia Bond on: 12/12/2015 09:54 AM   Modules accepted: Miquel Dunn

## 2015-12-12 NOTE — Assessment & Plan Note (Signed)
Chronic, stable. Continue current regimen. 

## 2015-12-12 NOTE — Assessment & Plan Note (Signed)
Chronic, stable. Continue cardura and proscar.

## 2015-12-12 NOTE — Patient Instructions (Addendum)
Blood work today. Trial silenor 3-6 mg nightly for sleep. Start lower PPI dose omeprazole '20mg'$  daily. Return as needed or in 1 year for next medicare wellness visit  Health Maintenance, Male A healthy lifestyle and preventative care can promote health and wellness.  Maintain regular health, dental, and eye exams.  Eat a healthy diet. Foods like vegetables, fruits, whole grains, low-fat dairy products, and lean protein foods contain the nutrients you need and are low in calories. Decrease your intake of foods high in solid fats, added sugars, and salt. Get information about a proper diet from your health care provider, if necessary.  Regular physical exercise is one of the most important things you can do for your health. Most adults should get at least 150 minutes of moderate-intensity exercise (any activity that increases your heart rate and causes you to sweat) each week. In addition, most adults need muscle-strengthening exercises on 2 or more days a week.   Maintain a healthy weight. The body mass index (BMI) is a screening tool to identify possible weight problems. It provides an estimate of body fat based on height and weight. Your health care provider can find your BMI and can help you achieve or maintain a healthy weight. For males 20 years and older:  A BMI below 18.5 is considered underweight.  A BMI of 18.5 to 24.9 is normal.  A BMI of 25 to 29.9 is considered overweight.  A BMI of 30 and above is considered obese.  Maintain normal blood lipids and cholesterol by exercising and minimizing your intake of saturated fat. Eat a balanced diet with plenty of fruits and vegetables. Blood tests for lipids and cholesterol should begin at age 26 and be repeated every 5 years. If your lipid or cholesterol levels are high, you are over age 43, or you are at high risk for heart disease, you may need your cholesterol levels checked more frequently.Ongoing high lipid and cholesterol levels  should be treated with medicines if diet and exercise are not working.  If you smoke, find out from your health care provider how to quit. If you do not use tobacco, do not start.  Lung cancer screening is recommended for adults aged 22-80 years who are at high risk for developing lung cancer because of a history of smoking. A yearly low-dose CT scan of the lungs is recommended for people who have at least a 30-pack-year history of smoking and are current smokers or have quit within the past 15 years. A pack year of smoking is smoking an average of 1 pack of cigarettes a day for 1 year (for example, a 30-pack-year history of smoking could mean smoking 1 pack a day for 30 years or 2 packs a day for 15 years). Yearly screening should continue until the smoker has stopped smoking for at least 15 years. Yearly screening should be stopped for people who develop a health problem that would prevent them from having lung cancer treatment.  If you choose to drink alcohol, do not have more than 2 drinks per day. One drink is considered to be 12 oz (360 mL) of beer, 5 oz (150 mL) of wine, or 1.5 oz (45 mL) of liquor.  Avoid the use of street drugs. Do not share needles with anyone. Ask for help if you need support or instructions about stopping the use of drugs.  High blood pressure causes heart disease and increases the risk of stroke. High blood pressure is more likely to develop in:  People who have blood pressure in the end of the normal range (100-139/85-89 mm Hg).  People who are overweight or obese.  People who are African American.  If you are 32-23 years of age, have your blood pressure checked every 3-5 years. If you are 73 years of age or older, have your blood pressure checked every year. You should have your blood pressure measured twice--once when you are at a hospital or clinic, and once when you are not at a hospital or clinic. Record the average of the two measurements. To check your blood  pressure when you are not at a hospital or clinic, you can use:  An automated blood pressure machine at a pharmacy.  A home blood pressure monitor.  If you are 39-73 years old, ask your health care provider if you should take aspirin to prevent heart disease.  Diabetes screening involves taking a blood sample to check your fasting blood sugar level. This should be done once every 3 years after age 34 if you are at a normal weight and without risk factors for diabetes. Testing should be considered at a younger age or be carried out more frequently if you are overweight and have at least 1 risk factor for diabetes.  Colorectal cancer can be detected and often prevented. Most routine colorectal cancer screening begins at the age of 62 and continues through age 46. However, your health care provider may recommend screening at an earlier age if you have risk factors for colon cancer. On a yearly basis, your health care provider may provide home test kits to check for hidden blood in the stool. A small camera at the end of a tube may be used to directly examine the colon (sigmoidoscopy or colonoscopy) to detect the earliest forms of colorectal cancer. Talk to your health care provider about this at age 81 when routine screening begins. A direct exam of the colon should be repeated every 5-10 years through age 49, unless early forms of precancerous polyps or small growths are found.  People who are at an increased risk for hepatitis B should be screened for this virus. You are considered at high risk for hepatitis B if:  You were born in a country where hepatitis B occurs often. Talk with your health care provider about which countries are considered high risk.  Your parents were born in a high-risk country and you have not received a shot to protect against hepatitis B (hepatitis B vaccine).  You have HIV or AIDS.  You use needles to inject street drugs.  You live with, or have sex with, someone who  has hepatitis B.  You are a man who has sex with other men (MSM).  You get hemodialysis treatment.  You take certain medicines for conditions like cancer, organ transplantation, and autoimmune conditions.  Hepatitis C blood testing is recommended for all people born from [birth year] through 1963 and any individual with known risk factors for hepatitis C.  Healthy men should no longer receive prostate-specific antigen (PSA) blood tests as part of routine cancer screening. Talk to your health care provider about prostate cancer screening.  Testicular cancer screening is not recommended for adolescents or adult males who have no symptoms. Screening includes self-exam, a health care provider exam, and other screening tests. Consult with your health care provider about any symptoms you have or any concerns you have about testicular cancer.  Practice safe sex. Use condoms and avoid high-risk sexual practices to reduce the spread of  sexually transmitted infections (STIs).  You should be screened for STIs, including gonorrhea and chlamydia if:  You are sexually active and are younger than 24 years.  You are older than 24 years, and your health care provider tells you that you are at risk for this type of infection.  Your sexual activity has changed since you were last screened, and you are at an increased risk for chlamydia or gonorrhea. Ask your health care provider if you are at risk.  If you are at risk of being infected with HIV, it is recommended that you take a prescription medicine daily to prevent HIV infection. This is called pre-exposure prophylaxis (PrEP). You are considered at risk if:  You are a man who has sex with other men (MSM).  You are a heterosexual man who is sexually active with multiple partners.  You take drugs by injection.  You are sexually active with a partner who has HIV.  Talk with your health care provider about whether you are at high risk of being infected with  HIV. If you choose to begin PrEP, you should first be tested for HIV. You should then be tested every 3 months for as long as you are taking PrEP.  Use sunscreen. Apply sunscreen liberally and repeatedly throughout the day. You should seek shade when your shadow is shorter than you. Protect yourself by wearing long sleeves, pants, a wide-brimmed hat, and sunglasses year round whenever you are outdoors.  Tell your health care provider of new moles or changes in moles, especially if there is a change in shape or color. Also, tell your health care provider if a mole is larger than the size of a pencil eraser.  A one-time screening for abdominal aortic aneurysm (AAA) and surgical repair of large AAAs by ultrasound is recommended for men aged 73-75 years who are current or former smokers.  Stay current with your vaccines (immunizations).   This information is not intended to replace advice given to you by your health care provider. Make sure you discuss any questions you have with your health care provider.   Document Released: 04/26/2008 Document Revised: 11/19/2014 Document Reviewed: 03/26/2011 Elsevier Interactive Patient Education [employer].

## 2016-01-26 ENCOUNTER — Other Ambulatory Visit: Payer: Self-pay | Admitting: Family Medicine

## 2016-01-27 ENCOUNTER — Encounter: Payer: Self-pay | Admitting: Family Medicine

## 2016-01-28 MED ORDER — ESZOPICLONE 2 MG PO TABS: 2.0000 mg | ORAL_TABLET | Freq: Every day | ORAL | Status: DC

## 2016-01-28 NOTE — Telephone Encounter (Signed)
plz phone in [place]

## 2016-01-30 NOTE — Telephone Encounter (Signed)
Rx called in as directed.   

## 2016-02-13 ENCOUNTER — Other Ambulatory Visit: Payer: Self-pay

## 2016-02-13 MED ORDER — METOPROLOL SUCCINATE ER 25 MG PO TB24: ORAL_TABLET | ORAL | Status: DC

## 2016-02-13 NOTE — Telephone Encounter (Signed)
Refill sent for Metoprolol succ ER 25 mg

## 2016-02-14 ENCOUNTER — Other Ambulatory Visit: Payer: Self-pay | Admitting: *Deleted

## 2016-02-14 MED ORDER — METOPROLOL SUCCINATE ER 25 MG PO TB24: ORAL_TABLET | ORAL | Status: DC

## 2016-02-14 NOTE — Telephone Encounter (Signed)
  Requested Prescriptions   Signed Prescriptions Disp Refills  . metoprolol succinate (TOPROL-XL) 25 MG 24 hr tablet 90 tablet 3    Sig: TAKE 1 TABLET (25 MG TOTAL) BY MOUTH DAILY.    Authorizing Provider: Kathlyn Sacramento A    Ordering User: Britt Bottom

## 2016-03-04 ENCOUNTER — Other Ambulatory Visit: Payer: Self-pay | Admitting: Family Medicine

## 2016-03-05 ENCOUNTER — Other Ambulatory Visit: Payer: Self-pay | Admitting: *Deleted

## 2016-03-05 MED ORDER — ESZOPICLONE 2 MG PO TABS: 2.0000 mg | ORAL_TABLET | Freq: Every day | ORAL | Status: DC

## 2016-03-05 NOTE — Telephone Encounter (Signed)
plz phone in. 

## 2016-03-05 NOTE — Telephone Encounter (Signed)
Patient left a voicemail requesting a refill on Lunesta Last refill 01/28/16 #30/3 Last office visit 12/12/15

## 2016-03-06 NOTE — Telephone Encounter (Signed)
lunesta called into [facility] on [address]

## 2016-03-07 ENCOUNTER — Other Ambulatory Visit: Payer: Self-pay | Admitting: Family Medicine

## 2016-03-08 ENCOUNTER — Telehealth: Payer: Self-pay | Admitting: *Deleted

## 2016-03-08 NOTE — Telephone Encounter (Signed)
PA for Lunesta in your IN box for completion. 

## 2016-03-08 NOTE — Telephone Encounter (Signed)
Filled and in Kim's box. 

## 2016-03-09 NOTE — Telephone Encounter (Signed)
PA faxed. Will await determination.

## 2016-03-14 NOTE — Telephone Encounter (Addendum)
PA approved. Pharmacy and patient notified.  

## 2016-04-10 ENCOUNTER — Ambulatory Visit (INDEPENDENT_AMBULATORY_CARE_PROVIDER_SITE_OTHER): Payer: Medicare Other | Admitting: Cardiovascular Disease

## 2016-04-10 ENCOUNTER — Encounter: Payer: Self-pay | Admitting: Cardiovascular Disease

## 2016-04-10 VITALS — BP 126/70 | HR 60 | Ht 68.0 in | Wt 187.8 lb

## 2016-04-10 DIAGNOSIS — R011 Cardiac murmur, unspecified: Secondary | ICD-10-CM

## 2016-04-10 DIAGNOSIS — I25119 Atherosclerotic heart disease of native coronary artery with unspecified angina pectoris: Secondary | ICD-10-CM

## 2016-04-10 NOTE — Patient Instructions (Signed)
Medication Instructions:  Your physician recommends that you continue on your current medications as directed. Please refer to the Current Medication list given to you today.   Labwork: none  Testing/Procedures: Your physician has requested that you have an echocardiogram. Echocardiography is a painless test that uses sound waves to create images of your heart. It provides your doctor with information about the size and shape of your heart and how well your heart's chambers and valves are working. This procedure takes approximately one hour. There are no restrictions for this procedure.    Follow-Up: Your physician wants you to follow-up in: one year with Dr. Fletcher Anon.  You will receive a reminder letter in the mail two months in advance. If you don't receive a letter, please call our office to schedule the follow-up appointment.   Any Other Special Instructions Will Be Listed Below (If Applicable).     If you need a refill on your cardiac medications before your next appointment, please call your pharmacy.  Echocardiogram An echocardiogram, or echocardiography, uses sound waves (ultrasound) to produce an image of your heart. The echocardiogram is simple, painless, obtained within a short period of time, and offers valuable information to your health care provider. The images from an echocardiogram can provide information such as:  Evidence of coronary artery disease (CAD).  Heart size.  Heart muscle function.  Heart valve function.  Aneurysm detection.  Evidence of a past heart attack.  Fluid buildup around the heart.  Heart muscle thickening.  Assess heart valve function. LET [facility] CARE PROVIDER KNOW ABOUT:  Any allergies you have.  All medicines you are taking, including vitamins, herbs, eye drops, creams, and over-the-counter medicines.  Previous problems you or members of your family have had with the use of anesthetics.  Any blood disorders you  have.  Previous surgeries you have had.  Medical conditions you have.  Possibility of pregnancy, if this applies. BEFORE THE PROCEDURE  No special preparation is needed. Eat and drink normally.  PROCEDURE   In order to produce an image of your heart, gel will be applied to your chest and a wand-like tool (transducer) will be moved over your chest. The gel will help transmit the sound waves from the transducer. The sound waves will harmlessly bounce off your heart to allow the heart images to be captured in real-time motion. These images will then be recorded.  You may need an IV to receive a medicine that improves the quality of the pictures. AFTER THE PROCEDURE You may return to your normal schedule including diet, activities, and medicines, unless your health care provider tells you otherwise.   This information is not intended to replace advice given to you by your health care provider. Make sure you discuss any questions you have with your health care provider.   Document Released: 10/26/2000 Document Revised: 11/19/2014 Document Reviewed: 07/06/2013 Elsevier Interactive Patient Education [employer].

## 2016-04-10 NOTE — Progress Notes (Signed)
Cardiology Office Note   Date:  04/10/2016   ID:  Stephen [name], DOB 1927/06/09, MRN [id number]  PCP:  Stephen Bush, MD  Cardiologist:   Stephen Sacramento, MD   Chief Complaint  Patient presents with  . other    9 Month F/U. Medications verbally reviewed with patient.       History of Present Illness: Stephen [name] is a 82 y.o. male who presents for a followup visit regarding coronary artery disease. He presented in May, 2015 to [facility] with acute onset of chest pain and was found to have inferior ST elevation on his EKG. He underwent emergent cardiac catheterization which showed an occluded mid RCA. He had successful angioplasty and drug-eluting stent placement. Ejection fraction was 45%. He has chronic medical conditions that include type 2 diabetes, hypertension and hyperlipidemia. He is a vegetarian since age 46 and lives at [facility]. He has been doing well and denies any chest pain or shortness of breath. He didn't tolerate Atorvastatin to myalgia.  He is known to have hiatal hernia. Symptoms improved with the PPI.   Past Medical History  Diagnosis Date  . History of basal cell cancer     s/p mohs  . Insomnia     treated with multiple meds in past  . Diabetes type 2, controlled ([place])   . BPH (benign prostatic hypertrophy)     w/ nocturia  . Rosacea   . Arthritis of knee   . History of colon polyps   . History of hypertension   . MI (myocardial infarction) ([place])   . Skin cancer   . Hypertension   . UTI (urinary tract infection)   . Coronary artery disease 03/2014    Inferior ST elevation myocardial infarction. Cardiac catheterization showed an occluded mid RCA. He had an angioplasty and drug-eluting stent placement with a 3.0 x 16 mm Promus drug-eluting stent. Ejection fraction was 45% by echo, completed cardiac rehab 06/2014  . HLD (hyperlipidemia)     diet controlled in past    Past Surgical History  Procedure Laterality Date    . Colonoscopy  08/2010    hyperplastic polyp, rec rpt 5 yrs  . Mohs surgery      basal cell chin/back  . Replacement total knee Left 08/2006  . Vasectomy  1970  . Cardiac catheterization  03/2014    [facility];x1 stent  . Esophagogastroduodenoscopy  05/2015    dilated stricture, normal biopsies, HH, no definite infection Stephen [name] @ [facility])     Current Outpatient Prescriptions  Medication Sig Dispense Refill  . aspirin 81 MG EC tablet TAKE 1 TABLET (81 MG TOTAL) BY MOUTH DAILY. 30 tablet 6  . Clindamycin Phos-Benzoyl Perox (ACANYA) gel Apply topically once a day    . doxazosin (CARDURA) 1 MG tablet TAKE 1 TABLET BY MOUTH EVERY DAY 30 tablet 11  . eszopiclone (LUNESTA) 2 MG TABS tablet Take 1 tablet (2 mg total) by mouth at bedtime. Take immediately before bedtime 30 tablet 3  . finasteride (PROSCAR) 5 MG tablet TAKE 1 TABLET BY MOUTH DAILY. 30 tablet 11  . metFORMIN (GLUCOPHAGE) 500 MG tablet TAKE 1 TABLET (500 MG TOTAL) BY MOUTH 2 (TWO) TIMES DAILY WITH A MEAL. 180 tablet 2  . metoprolol succinate (TOPROL-XL) 25 MG 24 hr tablet TAKE 1 TABLET (25 MG TOTAL) BY MOUTH DAILY. 90 tablet 3  . metroNIDAZOLE (METROGEL) 1 % gel Apply topically daily.    . Multiple Vitamin (MULTIVITAMIN) tablet  Take 1 tablet by mouth daily.    . nitroGLYCERIN (NITROSTAT) 0.4 MG SL tablet Place 0.4 mg under the tongue every 5 (five) minutes as needed for chest pain.    Marland Kitchen omeprazole (PRILOSEC) 20 MG capsule Take 1 capsule (20 mg total) by mouth daily. 30 capsule 11  . pravastatin (PRAVACHOL) 40 MG tablet Take 1 tablet (40 mg total) by mouth daily. 90 tablet 3  . Psyllium (METAMUCIL PO) Take by mouth every morning     No current facility-administered medications for this visit.    Allergies:   Atorvastatin; Belsomra; Penicillins; and Doxycycline    Social History:  The patient  reports that he quit smoking about 53 years ago. He has never used smokeless tobacco. He reports that he drinks alcohol. He reports that he does  not use illicit drugs.   Family History:  The patient's family history includes CAD in his brother; CAD (age of onset: 43) in his brother; COPD in his brother; Cancer in his mother; Cancer (age of onset: 33) in his father; Parkinson's disease in his brother; Stroke in his sister.    ROS:  Please see the history of present illness.   Otherwise, review of systems are positive for none.   All other systems are reviewed and negative.    PHYSICAL EXAM: VS:  BP 126/70 mmHg  Pulse 60  Ht '5\' 8"'$  (1.727 m)  Wt 187 lb 12.8 oz (85.186 kg)  BMI 28.56 kg/m2 , BMI Body mass index is 28.56 kg/(m^2). GEN: Well nourished, well developed, in no acute distress HEENT: normal Neck: no JVD, carotid bruits, or masses Cardiac: RRR; no rubs, or gallops,no edema . There is a 2/6 systolic ejection murmur in the aortic area Respiratory:  clear to auscultation bilaterally, normal work of breathing GI: soft, nontender, nondistended, + BS MS: no deformity or atrophy Skin: warm and dry, no rash Neuro:  Strength and sensation are intact Psych: euthymic mood, full affect   EKG:  EKG is ordered today. The ekg ordered today demonstrates normal sinus rhythm with old inferior infarct.   Recent Labs: 12/05/2015: ALT 12; BUN 15; Creatinine, Ser 1.09; Potassium 4.4; Sodium 137 12/12/2015: Hemoglobin 15.0; Platelets 275.0; TSH 1.53    Lipid Panel    Component Value Date/Time   CHOL 150 12/05/2015 0807   CHOL 124 05/04/2014 0806   TRIG 133.0 12/05/2015 0807   HDL 52.90 12/05/2015 0807   HDL 53 05/04/2014 0806   CHOLHDL 3 12/05/2015 0807   CHOLHDL 2.3 05/04/2014 0806   VLDL 26.6 12/05/2015 0807   LDLCALC 71 12/05/2015 0807   LDLCALC 48 05/04/2014 0806      Wt Readings from Last 3 Encounters:  04/10/16 187 lb 12.8 oz (85.186 kg)  12/12/15 186 lb (84.369 kg)  07/11/15 183 lb (83.008 kg)       ASSESSMENT AND PLAN:  1.  Coronary artery disease involving native coronary arteries without angina: Overall, he  is doing well with no anginal symptoms. I recommend continuing medical therapy.  2. Systolic murmur suggestive of aortic etiology: No recent evaluation. I requested an echocardiogram.  3. Hyperlipidemia: Most recent LDL was 71. He is currently on pravastatin as he did not tolerate atorvastatin in the past.    Disposition:   FU with me in 1 year  Signed,  Stephen Sacramento, MD  04/10/2016 2:55 PM    [place]

## 2016-04-13 ENCOUNTER — Ambulatory Visit (INDEPENDENT_AMBULATORY_CARE_PROVIDER_SITE_OTHER): Payer: Medicare Other

## 2016-04-13 ENCOUNTER — Other Ambulatory Visit: Payer: Self-pay

## 2016-04-13 DIAGNOSIS — R011 Cardiac murmur, unspecified: Secondary | ICD-10-CM

## 2016-04-30 DIAGNOSIS — L570 Actinic keratosis: Secondary | ICD-10-CM | POA: Diagnosis not present

## 2016-04-30 DIAGNOSIS — X32XXXA Exposure to sunlight, initial encounter: Secondary | ICD-10-CM | POA: Diagnosis not present

## 2016-04-30 DIAGNOSIS — D0421 Carcinoma in situ of skin of right ear and external auricular canal: Secondary | ICD-10-CM | POA: Diagnosis not present

## 2016-04-30 DIAGNOSIS — D485 Neoplasm of uncertain behavior of skin: Secondary | ICD-10-CM | POA: Diagnosis not present

## 2016-04-30 DIAGNOSIS — Z85828 Personal history of other malignant neoplasm of skin: Secondary | ICD-10-CM | POA: Diagnosis not present

## 2016-06-07 DIAGNOSIS — L905 Scar conditions and fibrosis of skin: Secondary | ICD-10-CM | POA: Diagnosis not present

## 2016-06-07 DIAGNOSIS — D0421 Carcinoma in situ of skin of right ear and external auricular canal: Secondary | ICD-10-CM | POA: Diagnosis not present

## 2016-07-23 DIAGNOSIS — H2513 Age-related nuclear cataract, bilateral: Secondary | ICD-10-CM | POA: Diagnosis not present

## 2016-07-23 LAB — HM DIABETES EYE EXAM

## 2016-07-24 ENCOUNTER — Other Ambulatory Visit: Payer: Self-pay | Admitting: Family Medicine

## 2016-07-24 NOTE — Telephone Encounter (Signed)
Ok to refill? Last filled 03/05/16 #30 3RF

## 2016-07-26 NOTE — Telephone Encounter (Signed)
Rx called in as directed.   

## 2016-07-26 NOTE — Telephone Encounter (Signed)
plz phoen in. 

## 2016-08-04 ENCOUNTER — Other Ambulatory Visit: Payer: Self-pay | Admitting: Family Medicine

## 2016-08-04 ENCOUNTER — Encounter: Payer: Self-pay | Admitting: Family Medicine

## 2016-08-31 ENCOUNTER — Other Ambulatory Visit: Payer: Self-pay | Admitting: Family Medicine

## 2016-09-13 DIAGNOSIS — H2512 Age-related nuclear cataract, left eye: Secondary | ICD-10-CM | POA: Diagnosis not present

## 2016-10-02 DIAGNOSIS — H2511 Age-related nuclear cataract, right eye: Secondary | ICD-10-CM | POA: Diagnosis not present

## 2016-10-03 ENCOUNTER — Encounter: Payer: Self-pay | Admitting: *Deleted

## 2016-10-08 DIAGNOSIS — L821 Other seborrheic keratosis: Secondary | ICD-10-CM | POA: Diagnosis not present

## 2016-10-08 DIAGNOSIS — X32XXXA Exposure to sunlight, initial encounter: Secondary | ICD-10-CM | POA: Diagnosis not present

## 2016-10-08 DIAGNOSIS — Z85828 Personal history of other malignant neoplasm of skin: Secondary | ICD-10-CM | POA: Diagnosis not present

## 2016-10-08 DIAGNOSIS — Z08 Encounter for follow-up examination after completed treatment for malignant neoplasm: Secondary | ICD-10-CM | POA: Diagnosis not present

## 2016-10-08 DIAGNOSIS — L570 Actinic keratosis: Secondary | ICD-10-CM | POA: Diagnosis not present

## 2016-10-09 ENCOUNTER — Ambulatory Visit
Admission: RE | Admit: 2016-10-09 | Discharge: 2016-10-09 | Disposition: A | Discharge status: Home / Self Care | Payer: Medicare Other | Source: Ambulatory Visit | Attending: Ophthalmology | Admitting: Ophthalmology

## 2016-10-09 ENCOUNTER — Ambulatory Visit: Payer: Medicare Other | Admitting: Anesthesiology

## 2016-10-09 ENCOUNTER — Encounter: Payer: Self-pay | Admitting: *Deleted

## 2016-10-09 ENCOUNTER — Encounter
Admission: RE | Disposition: A | Discharge status: Home / Self Care | Payer: Self-pay | Source: Ambulatory Visit | Attending: Ophthalmology

## 2016-10-09 DIAGNOSIS — I10 Essential (primary) hypertension: Secondary | ICD-10-CM | POA: Insufficient documentation

## 2016-10-09 DIAGNOSIS — G4700 Insomnia, unspecified: Secondary | ICD-10-CM | POA: Diagnosis not present

## 2016-10-09 DIAGNOSIS — E785 Hyperlipidemia, unspecified: Secondary | ICD-10-CM | POA: Diagnosis not present

## 2016-10-09 DIAGNOSIS — I252 Old myocardial infarction: Secondary | ICD-10-CM | POA: Insufficient documentation

## 2016-10-09 DIAGNOSIS — K219 Gastro-esophageal reflux disease without esophagitis: Secondary | ICD-10-CM | POA: Diagnosis not present

## 2016-10-09 DIAGNOSIS — Z87891 Personal history of nicotine dependence: Secondary | ICD-10-CM | POA: Insufficient documentation

## 2016-10-09 DIAGNOSIS — E1136 Type 2 diabetes mellitus with diabetic cataract: Secondary | ICD-10-CM | POA: Insufficient documentation

## 2016-10-09 DIAGNOSIS — M1990 Unspecified osteoarthritis, unspecified site: Secondary | ICD-10-CM | POA: Diagnosis not present

## 2016-10-09 DIAGNOSIS — I2510 Atherosclerotic heart disease of native coronary artery without angina pectoris: Secondary | ICD-10-CM | POA: Diagnosis not present

## 2016-10-09 DIAGNOSIS — Z955 Presence of coronary angioplasty implant and graft: Secondary | ICD-10-CM | POA: Insufficient documentation

## 2016-10-09 DIAGNOSIS — H2511 Age-related nuclear cataract, right eye: Secondary | ICD-10-CM | POA: Diagnosis not present

## 2016-10-09 DIAGNOSIS — H2512 Age-related nuclear cataract, left eye: Secondary | ICD-10-CM | POA: Diagnosis not present

## 2016-10-09 DIAGNOSIS — K449 Diaphragmatic hernia without obstruction or gangrene: Secondary | ICD-10-CM | POA: Insufficient documentation

## 2016-10-09 HISTORY — PX: CATARACT EXTRACTION W/PHACO: SHX586

## 2016-10-09 LAB — GLUCOSE, CAPILLARY: Glucose-Capillary: 117 mg/dL — ABNORMAL HIGH (ref 65–99)

## 2016-10-09 SURGERY — PHACOEMULSIFICATION, CATARACT, WITH IOL INSERTION
Anesthesia: Monitor Anesthesia Care | Site: Eye | Laterality: Left | Wound class: Clean

## 2016-10-09 MED ORDER — MOXIFLOXACIN HCL 0.5 % OP SOLN
OPHTHALMIC | Status: AC
  Administered 2016-10-09: 1 [drp] via OPHTHALMIC
  Filled 2016-10-09: qty 3

## 2016-10-09 MED ORDER — EPINEPHRINE PF 1 MG/ML IJ SOLN
INTRAMUSCULAR | Status: AC
Start: 2016-10-09 — End: 2016-10-09
  Filled 2016-10-09: qty 2

## 2016-10-09 MED ORDER — ARMC OPHTHALMIC DILATING DROPS
1.0000 "application " | OPHTHALMIC | Status: AC
  Administered 2016-10-09 (×3): 1 via OPHTHALMIC

## 2016-10-09 MED ORDER — NA CHONDROIT SULF-NA HYALURON 40-17 MG/ML IO SOLN
INTRAOCULAR | Status: AC
  Filled 2016-10-09: qty 1

## 2016-10-09 MED ORDER — MOXIFLOXACIN HCL 0.5 % OP SOLN
1.0000 [drp] | OPHTHALMIC | Status: AC
  Administered 2016-10-09 (×3): 1 [drp] via OPHTHALMIC

## 2016-10-09 MED ORDER — MOXIFLOXACIN HCL 0.5 % OP SOLN
OPHTHALMIC | Status: DC | PRN
  Administered 2016-10-09: 1 [drp] via OPHTHALMIC

## 2016-10-09 MED ORDER — LIDOCAINE HCL (PF) 4 % IJ SOLN
INTRAMUSCULAR | Status: AC
  Filled 2016-10-09: qty 5

## 2016-10-09 MED ORDER — MIDAZOLAM HCL 2 MG/2ML IJ SOLN
INTRAMUSCULAR | Status: DC | PRN
  Administered 2016-10-09: 1 mg via INTRAVENOUS

## 2016-10-09 MED ORDER — EPINEPHRINE PF 1 MG/ML IJ SOLN
INTRAOCULAR | Status: DC | PRN
  Administered 2016-10-09: 1 mL via OPHTHALMIC

## 2016-10-09 MED ORDER — POVIDONE-IODINE 5 % OP SOLN
OPHTHALMIC | Status: DC | PRN
  Administered 2016-10-09: 1 via OPHTHALMIC

## 2016-10-09 MED ORDER — FENTANYL CITRATE (PF) 100 MCG/2ML IJ SOLN
INTRAMUSCULAR | Status: DC | PRN
  Administered 2016-10-09: 25 ug via INTRAVENOUS

## 2016-10-09 MED ORDER — SODIUM CHLORIDE 0.9 % IV SOLN
INTRAVENOUS | Status: DC
Start: 2016-10-09 — End: 2016-10-09
  Administered 2016-10-09 (×2): via INTRAVENOUS

## 2016-10-09 MED ORDER — POVIDONE-IODINE 5 % OP SOLN
OPHTHALMIC | Status: AC
  Filled 2016-10-09: qty 30

## 2016-10-09 MED ORDER — CARBACHOL 0.01 % IO SOLN
INTRAOCULAR | Status: DC | PRN
  Administered 2016-10-09: 0.5 mL via INTRAOCULAR

## 2016-10-09 MED ORDER — LIDOCAINE HCL (PF) 4 % IJ SOLN
INTRAOCULAR | Status: DC | PRN
  Administered 2016-10-09: 4 mL via OPHTHALMIC

## 2016-10-09 MED ORDER — NA CHONDROIT SULF-NA HYALURON 40-17 MG/ML IO SOLN
INTRAOCULAR | Status: DC | PRN
  Administered 2016-10-09: 1 mL via INTRAOCULAR

## 2016-10-09 MED ORDER — ARMC OPHTHALMIC DILATING DROPS
OPHTHALMIC | Status: AC
  Administered 2016-10-09: 1 via OPHTHALMIC
  Filled 2016-10-09: qty 0.4

## 2016-10-09 SURGICAL SUPPLY — 21 items
CANNULA ANT/CHMB 27GA (MISCELLANEOUS) ×3 IMPLANT
CUP MEDICINE 2OZ PLAST GRAD ST (MISCELLANEOUS) ×3 IMPLANT
GLOVE BIO SURGEON STRL SZ8 (GLOVE) ×3 IMPLANT
GLOVE BIOGEL M 6.5 STRL (GLOVE) ×3 IMPLANT
GLOVE SURG LX 8.0 MICRO (GLOVE) ×2
GLOVE SURG LX STRL 8.0 MICRO (GLOVE) ×1 IMPLANT
GOWN STRL REUS W/ TWL LRG LVL3 (GOWN DISPOSABLE) ×2 IMPLANT
GOWN STRL REUS W/TWL LRG LVL3 (GOWN DISPOSABLE) ×4
LENS IOL TECNIS ITEC 17.5 (Intraocular Lens) ×3 IMPLANT
PACK CATARACT (MISCELLANEOUS) ×3 IMPLANT
PACK CATARACT BRASINGTON LX (MISCELLANEOUS) ×3 IMPLANT
PACK EYE AFTER SURG (MISCELLANEOUS) ×3 IMPLANT
SOL BSS BAG (MISCELLANEOUS) ×3
SOL PREP PVP 2OZ (MISCELLANEOUS) ×3
SOLUTION BSS BAG (MISCELLANEOUS) ×1 IMPLANT
SOLUTION PREP PVP 2OZ (MISCELLANEOUS) ×1 IMPLANT
SYR 3ML LL SCALE MARK (SYRINGE) ×3 IMPLANT
SYR 5ML LL (SYRINGE) ×3 IMPLANT
SYR TB 1ML 27GX1/2 LL (SYRINGE) ×3 IMPLANT
WATER STERILE IRR 250ML POUR (IV SOLUTION) ×3 IMPLANT
WIPE NON LINTING 3.25X3.25 (MISCELLANEOUS) ×3 IMPLANT

## 2016-10-09 NOTE — Discharge Instructions (Signed)
Eye Surgery Discharge Instructions  Expect mild scratchy sensation or mild soreness. DO NOT RUB YOUR EYE!  The day of surgery:  Minimal physical activity, but bed rest is not required  No reading, computer work, or close hand work  No bending, lifting, or straining.  May watch TV  For 24 hours:  No driving, legal decisions, or alcoholic beverages  Safety precautions  Eat anything you prefer: It is better to start with liquids, then soup then solid foods.  _____ Eye patch should be worn until postoperative exam tomorrow.  ____ Solar shield eyeglasses should be worn for comfort in the sunlight/patch while sleeping  Resume all regular medications including aspirin or Coumadin if these were discontinued prior to surgery. You may shower, bathe, shave, or wash your hair. Tylenol may be taken for mild discomfort.  Call your doctor if you experience significant pain, nausea, or vomiting, fever > 101 or other signs of infection. [phone] or [phone] Specific instructions:  Follow-up Information    PORFILIO,WILLIAM LOUIS, MD Follow up.   Specialty:  Ophthalmology Why:  Tomorrow at 10:45 am. Contact information: [place] [address] [phone]

## 2016-10-09 NOTE — Anesthesia Procedure Notes (Signed)
Procedure Name: MAC Date/Time: 10/09/2016 8:24 AM Performed by: Johnna Acosta Pre-anesthesia Checklist: Patient identified, Emergency Drugs available, Suction available, Patient being monitored and Timeout performed Patient Re-evaluated:Patient Re-evaluated prior to inductionOxygen Delivery Method: Nasal cannula

## 2016-10-09 NOTE — H&P (Signed)
All labs reviewed. Abnormal studies sent to patients PCP when indicated.  Previous H&P reviewed, patient examined, there are NO CHANGES.  Stephen Marciel LOUIS11/28/20178:14 AM

## 2016-10-09 NOTE — Op Note (Signed)
PREOPERATIVE DIAGNOSIS:  Nuclear sclerotic cataract of the left eye.   POSTOPERATIVE DIAGNOSIS:  Nuclear sclerotic cataract of the left eye.   OPERATIVE PROCEDURE: Procedure(s): CATARACT EXTRACTION PHACO AND INTRAOCULAR LENS PLACEMENT (IOC)   SURGEON:  Birder Robson, MD.   ANESTHESIA:  Anesthesiologist: Martha Clan, MD CRNA: Johnna Acosta, CRNA  1.      Managed anesthesia care. 2.     0.66m of Shugarcaine was instilled following the paracentesis   COMPLICATIONS:  None.   TECHNIQUE:   Stop and chop   DESCRIPTION OF PROCEDURE:  The patient was examined and consented in the preoperative holding area where the aforementioned topical anesthesia was applied to the left eye and then brought back to the Operating Room where the left eye was prepped and draped in the usual sterile ophthalmic fashion and a lid speculum was placed. A paracentesis was created with the side port blade and the anterior chamber was filled with viscoelastic. A near clear corneal incision was performed with the steel keratome. A continuous curvilinear capsulorrhexis was performed with a cystotome followed by the capsulorrhexis forceps. Hydrodissection and hydrodelineation were carried out with BSS on a blunt cannula. The lens was removed in a stop and chop  technique and the remaining cortical material was removed with the irrigation-aspiration handpiece. The capsular bag was inflated with viscoelastic and the Technis [id number] lens was placed in the capsular bag without complication. The remaining viscoelastic was removed from the eye with the irrigation-aspiration handpiece. The wounds were hydrated. The anterior chamber was flushed with Miostat and the eye was inflated to physiologic pressure. 0.172mVigamox was placed in the anterior chamber. The wounds were found to be water tight. The eye was dressed with Vigamox. The patient was given protective glasses to wear throughout the day and a shield with which to sleep  tonight. The patient was also given drops with which to begin a drop regimen today and will follow-up with me in one day.  Implant Name Type Inv. Item Serial No. Manufacturer Lot No. LRB No. Used  LENS IOL DIOP 17.5 - [id number] Lens LENS IOL DIOP 17.5 [id number]   Left 1    Procedure(s) with comments: CATARACT EXTRACTION PHACO AND INTRAOCULAR LENS PLACEMENT (IOC) (Left) - [place].13 AP% 18.3 CDE 13.45 Fluid pack lot # [id number]  Electronically signed: [place]1/28/2017 8:44 AM

## 2016-10-09 NOTE — Transfer of Care (Signed)
Immediate Anesthesia Transfer of Care Note  Patient: Stephen [name]  Procedure(s) Performed: Procedure(s) with comments: CATARACT EXTRACTION PHACO AND INTRAOCULAR LENS PLACEMENT (IOC) (Left) - [place] 1.13 AP% 18.3 CDE 13.45 Fluid pack lot # [id number]  Patient Location: PACU  Anesthesia Type:MAC  Level of Consciousness: awake  Airway & Oxygen Therapy: Patient Spontanous Breathing and Patient connected to nasal cannula oxygen  Post-op Assessment: Report given to RN and Post -op Vital signs reviewed and stable  Post vital signs: Reviewed and stable  Last Vitals:  Vitals:   10/09/16 0713  BP: 134/77  Resp: (!) 59  Temp: (!) 35.9 C    Last Pain:  Vitals:   10/09/16 0713  TempSrc: Tympanic         Complications: No apparent anesthesia complications

## 2016-10-09 NOTE — Anesthesia Postprocedure Evaluation (Signed)
Anesthesia Post Note  Patient: Stephen [name]  Procedure(s) Performed: Procedure(s) (LRB): CATARACT EXTRACTION PHACO AND INTRAOCULAR LENS PLACEMENT (IOC) (Left)  Patient location during evaluation: PACU Anesthesia Type: MAC Level of consciousness: awake and alert Pain management: pain level controlled Vital Signs Assessment: post-procedure vital signs reviewed and stable Respiratory status: spontaneous breathing, nonlabored ventilation and respiratory function stable Cardiovascular status: stable and blood pressure returned to baseline Anesthetic complications: no    Last Vitals:  Vitals:   10/09/16 0855 10/09/16 0908  BP: 107/88 (!) 113/54  Pulse: (!) 53 (!) 50  Resp: 16 16  Temp:      Last Pain:  Vitals:   10/09/16 0847  TempSrc: Temporal                 Martha Clan

## 2016-10-09 NOTE — Anesthesia Preprocedure Evaluation (Signed)
Anesthesia Evaluation  Patient identified by MRN, date of birth, ID band Patient awake    Reviewed: Allergy & Precautions, H&P , NPO status , Patient's Chart, lab work & pertinent test results, reviewed documented beta blocker date and time   History of Anesthesia Complications Negative for: history of anesthetic complications  Airway Mallampati: I  TM Distance: >3 FB Neck ROM: full    Dental  (+) Partial Lower, Poor Dentition, Missing   Pulmonary neg pulmonary ROS, former smoker,    Pulmonary exam normal breath sounds clear to auscultation       Cardiovascular Exercise Tolerance: Good hypertension, (-) angina+ CAD, + Past MI and + Cardiac Stents  (-) CABG Normal cardiovascular exam(-) dysrhythmias (-) Valvular Problems/Murmurs Rhythm:regular Rate:Normal     Neuro/Psych negative neurological ROS  negative psych ROS   GI/Hepatic Neg liver ROS, hiatal hernia, GERD  ,  Endo/Other  diabetes  Renal/GU negative Renal ROS  negative genitourinary   Musculoskeletal   Abdominal   Peds  Hematology negative hematology ROS (+)   Anesthesia Other Findings Past Medical History: No date: Arthritis of knee No date: BPH (benign prostatic hypertrophy)     Comment: w/ nocturia 03/2014: Coronary artery disease     Comment: Inferior ST elevation myocardial infarction.               Cardiac catheterization showed an occluded mid               RCA. He had an angioplasty and drug-eluting               stent placement with a 3.0 x 16 mm Promus               drug-eluting stent. Ejection fraction was 45%               by echo, completed cardiac rehab 06/2014 No date: Diabetes type 2, controlled ([place]) No date: History of basal cell cancer     Comment: s/p mohs No date: History of colon polyps No date: History of hypertension No date: HLD (hyperlipidemia)     Comment: diet controlled in past No date: Hypertension No date: Insomnia   Comment: treated with multiple meds in past No date: MI (myocardial infarction) No date: Rosacea No date: Skin cancer No date: UTI (urinary tract infection)   Reproductive/Obstetrics negative OB ROS                             Anesthesia Physical Anesthesia Plan  ASA: II  Anesthesia Plan: MAC   Post-op Pain Management:    Induction:   Airway Management Planned:   Additional Equipment:   Intra-op Plan:   Post-operative Plan:   Informed Consent: I have reviewed the patients History and Physical, chart, labs and discussed the procedure including the risks, benefits and alternatives for the proposed anesthesia with the patient or authorized representative who has indicated his/her understanding and acceptance.   Dental Advisory Given  Plan Discussed with: Anesthesiologist, CRNA and Surgeon  Anesthesia Plan Comments:         Anesthesia Quick Evaluation

## 2016-10-30 ENCOUNTER — Other Ambulatory Visit: Payer: Self-pay | Admitting: Family Medicine

## 2016-11-24 ENCOUNTER — Other Ambulatory Visit: Payer: Self-pay | Admitting: Family Medicine

## 2016-11-26 ENCOUNTER — Other Ambulatory Visit: Payer: Self-pay | Admitting: Family Medicine

## 2016-11-26 NOTE — Telephone Encounter (Signed)
plz phoen in. 

## 2016-11-26 NOTE — Telephone Encounter (Signed)
Ok to refill? Last filled 07/26/16 #30 3RF

## 2016-11-27 NOTE — Telephone Encounter (Signed)
Rx called in as directed.   

## 2016-11-28 ENCOUNTER — Other Ambulatory Visit: Payer: Self-pay | Admitting: Family Medicine

## 2016-12-10 ENCOUNTER — Ambulatory Visit: Payer: Medicare Other

## 2016-12-11 ENCOUNTER — Other Ambulatory Visit: Payer: Self-pay | Admitting: Family Medicine

## 2016-12-11 ENCOUNTER — Other Ambulatory Visit (INDEPENDENT_AMBULATORY_CARE_PROVIDER_SITE_OTHER): Payer: Medicare Other

## 2016-12-11 DIAGNOSIS — E119 Type 2 diabetes mellitus without complications: Secondary | ICD-10-CM

## 2016-12-11 DIAGNOSIS — E7800 Pure hypercholesterolemia, unspecified: Secondary | ICD-10-CM

## 2016-12-11 LAB — COMPREHENSIVE METABOLIC PANEL
ALBUMIN: 4 g/dL (ref 3.5–5.2)
ALK PHOS: 50 U/L (ref 39–117)
ALT: 17 U/L (ref 0–53)
AST: 25 U/L (ref 0–37)
BUN: 17 mg/dL (ref 6–23)
CHLORIDE: 102 meq/L (ref 96–112)
CO2: 31 mEq/L (ref 19–32)
CREATININE: 1.16 mg/dL (ref 0.40–1.50)
Calcium: 9.5 mg/dL (ref 8.4–10.5)
GFR: 63.27 mL/min (ref >60.00)
Glucose, Bld: 116 mg/dL — ABNORMAL HIGH (ref 70–99)
POTASSIUM: 4.8 meq/L (ref 3.5–5.1)
Sodium: 136 mEq/L (ref 135–145)
TOTAL PROTEIN: 7 g/dL (ref 6.0–8.3)
Total Bilirubin: 0.6 mg/dL (ref 0.2–1.2)

## 2016-12-11 LAB — LIPID PANEL
CHOLESTEROL: 121 mg/dL (ref 0–200)
HDL: 47.8 mg/dL (ref >39.00)
LDL CALC: 51 mg/dL (ref 0–99)
NonHDL: 73.13
TRIGLYCERIDES: 110 mg/dL (ref 0.0–149.0)
Total CHOL/HDL Ratio: 3
VLDL: 22 mg/dL (ref 0.0–40.0)

## 2016-12-11 LAB — MICROALBUMIN / CREATININE URINE RATIO
Creatinine,U: 129.9 mg/dL
Microalb Creat Ratio: 2 mg/g (ref 0.0–30.0)
Microalb, Ur: 2.6 mg/dL — ABNORMAL HIGH (ref 0.0–1.9)

## 2016-12-11 LAB — HEMOGLOBIN A1C: HEMOGLOBIN A1C: 6.6 % — AB (ref 4.6–6.5)

## 2016-12-16 ENCOUNTER — Other Ambulatory Visit: Payer: Self-pay | Admitting: Family Medicine

## 2016-12-17 ENCOUNTER — Encounter: Payer: Self-pay | Admitting: Family Medicine

## 2016-12-17 ENCOUNTER — Ambulatory Visit (INDEPENDENT_AMBULATORY_CARE_PROVIDER_SITE_OTHER): Payer: Medicare Other | Admitting: Family Medicine

## 2016-12-17 VITALS — BP 106/72 | HR 65 | Temp 97.8°F | Resp 17 | Ht 68.0 in | Wt 192.8 lb

## 2016-12-17 DIAGNOSIS — N400 Enlarged prostate without lower urinary tract symptoms: Secondary | ICD-10-CM | POA: Diagnosis not present

## 2016-12-17 DIAGNOSIS — Z0000 Encounter for general adult medical examination without abnormal findings: Secondary | ICD-10-CM

## 2016-12-17 DIAGNOSIS — J8410 Pulmonary fibrosis, unspecified: Secondary | ICD-10-CM | POA: Diagnosis not present

## 2016-12-17 DIAGNOSIS — G3184 Mild cognitive impairment, so stated: Secondary | ICD-10-CM | POA: Diagnosis not present

## 2016-12-17 DIAGNOSIS — E7800 Pure hypercholesterolemia, unspecified: Secondary | ICD-10-CM | POA: Diagnosis not present

## 2016-12-17 DIAGNOSIS — F0150 Vascular dementia without behavioral disturbance: Secondary | ICD-10-CM | POA: Insufficient documentation

## 2016-12-17 DIAGNOSIS — E119 Type 2 diabetes mellitus without complications: Secondary | ICD-10-CM

## 2016-12-17 DIAGNOSIS — G309 Alzheimer's disease, unspecified: Secondary | ICD-10-CM | POA: Insufficient documentation

## 2016-12-17 DIAGNOSIS — K449 Diaphragmatic hernia without obstruction or gangrene: Secondary | ICD-10-CM | POA: Diagnosis not present

## 2016-12-17 DIAGNOSIS — Z7189 Other specified counseling: Secondary | ICD-10-CM

## 2016-12-17 DIAGNOSIS — F5101 Primary insomnia: Secondary | ICD-10-CM | POA: Diagnosis not present

## 2016-12-17 MED ORDER — FINASTERIDE 5 MG PO TABS: 5.0000 mg | ORAL_TABLET | Freq: Every evening | ORAL | 3 refills | Status: DC

## 2016-12-17 NOTE — Assessment & Plan Note (Signed)
Detailed advanced directive scanned into chart 03/2014: HCPOA - Osvaldo Angst. Does not want prolonged life support, does not want CPR.

## 2016-12-17 NOTE — Assessment & Plan Note (Signed)
Chronic, stable. Continue proscar.

## 2016-12-17 NOTE — Progress Notes (Addendum)
BP 106/72 (BP Location: Left Arm, Patient Position: Sitting, Cuff Size: Normal)   Pulse 65   Temp 97.8 F (36.6 C) (Oral)   Resp 17   Ht '5\' 8"'$  (1.727 m)   Wt 192 lb 12.8 oz (87.5 kg)   SpO2 96%   BMI 29.32 kg/m    CC: medicare wellness visit f/u Subjective:    Patient ID: Stephen [name], male    DOB: [birth year], 83 y.o.   MRN: [id number]  HPI: Stephen [name] is a 83 y.o. male presenting on 12/17/2016 for Annual Exam   Cough present with meals, more present with fibrous food.  Increasing memory concerns with patient and significant other. Progressively worsening over the past year. Sister with cognitive troubles later in life, died of 81yo. Mo other fmhx.   Geriatric Assessment: Activities of Daily Living:     Bathing- independent    Dressing- independent    Eating- independent    Toileting- independent    Transferring- independent    Continence- independent Overall Assessment: independent  Instrumental Activities of Daily Living:     Transportation- independent    Meal/Food Preparation- independent    Shopping Errands- independent     Housekeeping/Chores- independent    Money Management/Finances- independent    Medication Management- independent    Ability to Use Telephone- independent    Laundry- dependent (partner does this) Overall Assessment:  independent  Mental Status Exam: 24-26/30 (value/max value)  Missed 2 points orientation, 1/3 recall, 3/3 with cue, 2 points in language.   Clock Drawing Score: 3 /4  Chronic insomnia treated with lunesta. ambien (IR and CR), melatonin, rozerem ineffective, belsomra intolerable, silenor didn't help. Stable on lunesta nightly.  HH with cameron lesions by EGD controlled on PPI.  CAD with systolic murmur due to aortic valve sclerosis, followed by Dr Fletcher Anon.   Sees eye doctor Wears hearing aides No falls in the past year Denies depression/anhedonia  Preventative: Colonoscopy done 2011 with HP would be due for rpt  2016 (done at [facility]). iFOB negative 2016. Age out.  Prostate - age out Flu yearly Pneumvax 2012, prevnar 2016 Td 05/2008  zostavax 05/2008  Detailed advanced directive scanned into chart 03/2014: HCPOA - Osvaldo Angst. Does not want prolonged life support, does not want CPR.  Seat belt use discussed Sunscreen use discussed. No changing moles on skin. Known h/o rosacea. sees derm yearly Ex smoker remotely Alcohol - 1 beer/day  Lives at [facility] with friend - Osvaldo Angst RN.  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 Interim medical history since our last visit reviewed. Allergies and medications reviewed and updated. Current Outpatient Prescriptions on File Prior to Visit  Medication Sig  . aspirin 81 MG EC tablet TAKE 1 TABLET (81 MG TOTAL) BY MOUTH DAILY.  Marland Kitchen doxazosin (CARDURA) 1 MG tablet TAKE 1 TABLET BY MOUTH EVERY DAY  . eszopiclone (LUNESTA) 2 MG TABS tablet TAKE 1 TABLET BY MOUTH AT BEDTIME  . metFORMIN (GLUCOPHAGE) 500 MG tablet TAKE 1 TABLET (500 MG TOTAL) BY MOUTH 2 (TWO) TIMES DAILY WITH A MEAL.  . metoprolol succinate (TOPROL-XL) 25 MG 24 hr tablet TAKE 1 TABLET (25 MG TOTAL) BY MOUTH DAILY.  . metroNIDAZOLE (METROGEL) 1 %  gel Apply 1 application topically 2 (two) times daily.   . Multiple Vitamin (MULTIVITAMIN WITH MINERALS) TABS tablet Take 1 tablet by mouth daily.  . naproxen sodium (ANAPROX) 220 MG tablet Take 220 mg by mouth daily as needed (pain).  . nitroGLYCERIN (NITROSTAT) 0.4 MG SL tablet Place 0.4 mg under the tongue every 5 (five) minutes as needed for chest pain.  Marland Kitchen omeprazole (PRILOSEC) 20 MG capsule TAKE ONE CAPSULE BY MOUTH EVERY DAY  . pravastatin (PRAVACHOL) 40 MG tablet  Take 1 tablet (40 mg total) by mouth daily. (Patient taking differently: Take 40 mg by mouth every evening. )  . Psyllium (METAMUCIL PO) Take 1 Dose by mouth daily.   . vitamin B-12 (CYANOCOBALAMIN) 500 MCG tablet Take 500 mcg by mouth daily.   No current facility-administered medications on file prior to visit.     Review of Systems Per HPI unless specifically indicated in ROS section     Objective:    BP 106/72 (BP Location: Left Arm, Patient Position: Sitting, Cuff Size: Normal)   Pulse 65   Temp 97.8 F (36.6 C) (Oral)   Resp 17   Ht '5\' 8"'$  (1.727 m)   Wt 192 lb 12.8 oz (87.5 kg)   SpO2 96%   BMI 29.32 kg/m   Wt Readings from Last 3 Encounters:  12/17/16 192 lb 12.8 oz (87.5 kg)  10/03/16 185 lb (83.9 kg)  04/10/16 187 lb 12.8 oz (85.2 kg)    Physical Exam  Constitutional: He is oriented to person, place, and time. He appears well-developed and well-nourished. No distress.  HENT:  Head: Normocephalic and atraumatic.  Right Ear: Tympanic membrane and ear canal normal. Decreased hearing is noted.  Left Ear: Tympanic membrane and ear canal normal. Decreased hearing is noted.  Nose: Nose normal.  Mouth/Throat: Uvula is midline, oropharynx is clear and moist and mucous membranes are normal. No oropharyngeal exudate, posterior oropharyngeal edema or posterior oropharyngeal erythema.  Hearing aides in place  Eyes: Conjunctivae and EOM are normal. Pupils are equal, round, and reactive to light. No scleral icterus.  Neck: Normal range of motion. Neck supple.  Cardiovascular: Normal rate, regular rhythm and intact distal pulses.   Murmur (3/6 systolic) heard. Pulses:      Radial pulses are 2+ on the right side, and 2+ on the left side.  Pulmonary/Chest: Effort normal and breath sounds normal. No respiratory distress. He has no wheezes. He has no rales.  Abdominal: Soft. Bowel sounds are normal. He exhibits no distension and no mass. There is no tenderness. There is no rebound and  no guarding.  Musculoskeletal: Normal range of motion. He exhibits no edema.  Lymphadenopathy:    He has no cervical adenopathy.  Neurological: He is alert and oriented to person, place, and time.  CN grossly intact, station and gait intact  Skin: Skin is warm and dry. No rash noted.  Psychiatric: He has a normal mood and affect. His behavior is normal. Judgment and thought content normal.  Nursing note and vitals reviewed.  Results for orders placed or performed in visit on 12/11/16  Lipid panel  Result Value Ref Range   Cholesterol 121 0 - 200 mg/dL   Triglycerides 110.0 0.0 - 149.0 mg/dL   HDL 47.80 >39.00 mg/dL   VLDL 22.0 0.0 - 40.0 mg/dL   LDL Cholesterol 51 0 - 99 mg/dL   Total CHOL/HDL Ratio 3    NonHDL 73.13   Comprehensive metabolic panel  Result Value Ref Range  Sodium 136 135 - 145 mEq/L   Potassium 4.8 3.5 - 5.1 mEq/L   Chloride 102 96 - 112 mEq/L   CO2 31 19 - 32 mEq/L   Glucose, Bld 116 (H) 70 - 99 mg/dL   BUN 17 6 - 23 mg/dL   Creatinine, Ser 1.16 0.40 - 1.50 mg/dL   Total Bilirubin 0.6 0.2 - 1.2 mg/dL   Alkaline Phosphatase 50 39 - 117 U/L   AST 25 0 - 37 U/L   ALT 17 0 - 53 U/L   Total Protein 7.0 6.0 - 8.3 g/dL   Albumin 4.0 3.5 - 5.2 g/dL   Calcium 9.5 8.4 - 10.5 mg/dL   GFR 63.27 >60.00 mL/min  Hemoglobin A1c  Result Value Ref Range   Hgb A1c MFr Bld 6.6 (H) 4.6 - 6.5 %  Microalbumin / creatinine urine ratio  Result Value Ref Range   Microalb, Ur 2.6 (H) 0.0 - 1.9 mg/dL   Creatinine,U 129.9 mg/dL   Microalb Creat Ratio 2.0 0.0 - 30.0 mg/g      Assessment & Plan:   Problem List Items Addressed This Visit    Advanced care planning/counseling discussion    Detailed advanced directive scanned into chart 03/2014: HCPOA - Osvaldo Angst. Does not want prolonged life support, does not want CPR.       Benign prostatic hyperplasia    Chronic, stable. Continue proscar.       Relevant Medications   finasteride (PROSCAR) 5 MG tablet   Diabetes type  2, controlled (HCC)    Chronic, stable. Continue current regimen.       Hiatal hernia    Reviewed EGD (I did not find report in epic)      HLD (hyperlipidemia)    Chronic, stable. Continue pravastatin.       Insomnia    Longstanding. Stable on lunesta nightly. Discussed pros/cons of medication. Reviewed sleep hygiene. rec trial 1/2 tab nightly.       MCI (mild cognitive impairment) with memory loss    Reviewed with patient and partner.  Memory troubles currently not affecting ADLs or IADLs. Continues to drive.  Discussed possible aricept.  Encouraged continued regular exercise, social interaction, and memory puzzles/reading.  F/u 6 mo recheck.       Medicare annual wellness visit, subsequent - Primary    I have personally reviewed the Medicare Annual Wellness questionnaire and have noted 1. The patient's medical and social history 2. Their use of alcohol, tobacco or illicit drugs 3. Their current medications and supplements 4. The patient's functional ability including ADL's, fall risks, home safety risks and hearing or visual impairment. Cognitive function has been assessed and addressed as indicated.  5. Diet and physical activity 6. Evidence for depression or mood disorders The patients weight, height, BMI have been recorded in the chart. I have made referrals, counseling and provided education to the patient based on review of the above and I have provided the pt with a written personalized care plan for preventive services. Provider list updated.. See scanned questionairre as needed for further documentation. Reviewed preventative protocols and updated unless pt declined.       Pulmonary interstitial fibrosis (HCC)    pulm fibrosis + emphysema by CT. Declined pulm eval.           Follow up plan: Return in about 6 months (around 06/16/2017) for follow up visit.  Ria Bush, MD

## 2016-12-17 NOTE — Assessment & Plan Note (Signed)
Chronic, stable. Continue current regimen. 

## 2016-12-17 NOTE — Assessment & Plan Note (Addendum)
Longstanding. Stable on lunesta nightly. Discussed pros/cons of medication. Reviewed sleep hygiene. rec trial 1/2 tab nightly.

## 2016-12-17 NOTE — Assessment & Plan Note (Signed)
Chronic, stable. Continue pravastatin. 

## 2016-12-17 NOTE — Progress Notes (Signed)
Pre visit review using our clinic review tool, if applicable. No additional management support is needed unless otherwise documented below in the visit note. 

## 2016-12-17 NOTE — Assessment & Plan Note (Signed)

## 2016-12-17 NOTE — Assessment & Plan Note (Signed)
pulm fibrosis + emphysema by CT. Declined pulm eval.

## 2016-12-17 NOTE — Assessment & Plan Note (Signed)
Reviewed with patient and partner.  Memory troubles currently not affecting ADLs or IADLs. Continues to drive.  Discussed possible aricept.  Encouraged continued regular exercise, social interaction, and memory puzzles/reading.  F/u 6 mo recheck.

## 2016-12-17 NOTE — Patient Instructions (Addendum)
Check to see which pneumonia shot you had in 09/2011 (prevnar 13 or pneumovax 23).  Consider aricept Consider decreasing lunesta to 1/2 tablet. Continue staying active, involved and engaged, work on [employer]  Good to see you today, call us with questions. Continue current medicines. Return as needed or in 6 months for follow up visit

## 2016-12-17 NOTE — Assessment & Plan Note (Signed)
Reviewed EGD (I did not find report in epic)

## 2016-12-19 ENCOUNTER — Encounter: Payer: Self-pay | Admitting: Family Medicine

## 2016-12-22 MED ORDER — DONEPEZIL HCL 5 MG PO TABS: 5.0000 mg | ORAL_TABLET | Freq: Every day | ORAL | 6 refills | Status: DC

## 2017-01-19 ENCOUNTER — Other Ambulatory Visit: Payer: Self-pay | Admitting: Family Medicine

## 2017-01-20 ENCOUNTER — Other Ambulatory Visit: Payer: Self-pay | Admitting: Family Medicine

## 2017-03-18 DIAGNOSIS — H35372 Puckering of macula, left eye: Secondary | ICD-10-CM | POA: Diagnosis not present

## 2017-03-20 ENCOUNTER — Other Ambulatory Visit: Payer: Self-pay | Admitting: Family Medicine

## 2017-03-28 ENCOUNTER — Other Ambulatory Visit: Payer: Self-pay | Admitting: Family Medicine

## 2017-03-28 MED ORDER — METFORMIN HCL 500 MG PO TABS: 500.0000 mg | ORAL_TABLET | Freq: Two times a day (BID) | ORAL | 1 refills | Status: DC

## 2017-03-29 ENCOUNTER — Other Ambulatory Visit: Payer: Self-pay

## 2017-03-29 ENCOUNTER — Other Ambulatory Visit: Payer: Self-pay | Admitting: Family Medicine

## 2017-03-29 NOTE — Telephone Encounter (Signed)
Last refill 11/26/16 #30+ 3.

## 2017-04-01 MED ORDER — ESZOPICLONE 2 MG PO TABS: 2.0000 mg | ORAL_TABLET | Freq: Every day | ORAL | 3 refills | Status: DC

## 2017-04-01 NOTE — Telephone Encounter (Signed)
Rx called in to requested pharmacy 

## 2017-04-01 NOTE — Telephone Encounter (Signed)
plz phone in. 

## 2017-04-04 ENCOUNTER — Encounter: Payer: Self-pay | Admitting: Family Medicine

## 2017-04-04 DIAGNOSIS — F5101 Primary insomnia: Secondary | ICD-10-CM

## 2017-04-04 NOTE — Telephone Encounter (Signed)
Pt left note requesting different med to take place of Lunesta because ins will not pay. Pt did not think he could get in touch with ins to see what substitute would be approved. Pt request new med to [place].pt contact #[phone].

## 2017-04-05 ENCOUNTER — Encounter: Payer: Self-pay | Admitting: Family Medicine

## 2017-04-05 MED ORDER — ZOLPIDEM TARTRATE ER 6.25 MG PO TBCR: 6.2500 mg | EXTENDED_RELEASE_TABLET | Freq: Every evening | ORAL | 0 refills | Status: DC | PRN

## 2017-04-05 NOTE — Telephone Encounter (Signed)
ambien CR sent to pharmacy. Tried and failed multiple others.

## 2017-04-09 ENCOUNTER — Encounter: Payer: Self-pay | Admitting: Cardiovascular Disease

## 2017-04-09 ENCOUNTER — Ambulatory Visit (INDEPENDENT_AMBULATORY_CARE_PROVIDER_SITE_OTHER): Payer: Medicare Other | Admitting: Cardiovascular Disease

## 2017-04-09 VITALS — BP 120/62 | HR 54 | Ht 68.0 in | Wt 187.0 lb

## 2017-04-09 DIAGNOSIS — I2510 Atherosclerotic heart disease of native coronary artery without angina pectoris: Secondary | ICD-10-CM

## 2017-04-09 DIAGNOSIS — E785 Hyperlipidemia, unspecified: Secondary | ICD-10-CM

## 2017-04-09 NOTE — Patient Instructions (Signed)
Medication Instructions: Continue same medications.   Labwork: None.   Procedures/Testing: None.   Follow-Up: 1 year with Dr. Arida.   Any Additional Special Instructions Will Be Listed Below (If Applicable).     If you need a refill on your cardiac medications before your next appointment, please call your pharmacy.   

## 2017-04-09 NOTE — Progress Notes (Signed)
Cardiology Office Note   Date:  04/09/2017   ID:  Stephen [name], DOB [birth year], MRN [id number]  PCP:  Ria Bush, MD  Cardiologist:   Kathlyn Sacramento, MD   Chief Complaint  Patient presents with  . other    12 month follow up. Patient denies chest pain and SOB. Meds reviewed verbally with patient.      History of Present Illness: Stephen [name] is a 83 y.o. male who presents for a followup visit regarding coronary artery disease. He had inferior ST elevation myocardial infarction in May 2015. Emergent cardiac catheterization showed an occluded mid RCA which was treated successfully with PCI and drug-eluting stent placement. Ejection fraction was 45%. He has chronic medical conditions that include type 2 diabetes, hypertension and hyperlipidemia. He is a vegetarian since age 89 and lives at [facility]. He didn't tolerate Atorvastatin to myalgia.  He is known to have hiatal hernia. Symptoms improved with the PPI.  He had an echocardiogram done in June 2017 due to a cardiac murmur. The echo showed normal LV systolic function with an EF of 55-60% with mildly calcified aortic valve with mild regurgitation.  He has been doing very well with no chest pain or shortness of breath. He had some memory decline over the last year and was started on Aricept.   Past Medical History:  Diagnosis Date  . Arthritis of knee   . BPH (benign prostatic hypertrophy)    w/ nocturia  . Coronary artery disease 03/2014   Inferior ST elevation myocardial infarction. Cardiac catheterization showed an occluded mid RCA. He had an angioplasty and drug-eluting stent placement with a 3.0 x 16 mm Promus drug-eluting stent. Ejection fraction was 45% by echo, completed cardiac rehab 06/2014  . Diabetes type 2, controlled ([place])   . History of basal cell cancer    s/p mohs  . History of colon polyps   . History of hypertension   . HLD (hyperlipidemia)    diet controlled in  past  . Hypertension   . Insomnia    treated with multiple meds in past  . MI (myocardial infarction) ([place])   . Rosacea   . Skin cancer   . UTI (urinary tract infection)     Past Surgical History:  Procedure Laterality Date  . CARDIAC CATHETERIZATION  03/2014   [facility];x1 stent  . CATARACT EXTRACTION W/PHACO Left 10/09/2016   Procedure: CATARACT EXTRACTION PHACO AND INTRAOCULAR LENS PLACEMENT (IOC);  Surgeon: Birder Robson, MD;  Location: [facility] ORS;  Service: Ophthalmology;  Laterality: Left;  [place] 1.13 AP% 18.3 CDE 13.45 Fluid pack lot # [id number]  . COLONOSCOPY  08/2010   hyperplastic polyp, rec rpt 5 yrs  . ESOPHAGOGASTRODUODENOSCOPY  05/2015   dilated stricture, normal biopsies, HH, no definite infection [name] @ [facility])  . MOHS SURGERY     basal cell chin/back  . REPLACEMENT TOTAL KNEE Left 08/2006  . VASECTOMY  1970     Current Outpatient Prescriptions  Medication Sig Dispense Refill  . aspirin 81 MG EC tablet TAKE 1 TABLET (81 MG TOTAL) BY MOUTH DAILY. 30 tablet 6  . donepezil (ARICEPT) 5 MG tablet Take 1 tablet (5 mg total) by mouth at bedtime. 30 tablet 6  . doxazosin (CARDURA) 1 MG tablet TAKE 1 TABLET BY MOUTH EVERY DAY 30 tablet 11  . FIBER, CORN DEXTRIN, PO Take by mouth.    . finasteride (PROSCAR) 5 MG tablet TAKE 1 TABLET BY MOUTH DAILY. [place]  tablet 1  . metFORMIN (GLUCOPHAGE) 500 MG tablet Take 1 tablet (500 mg total) by mouth 2 (two) times daily with a meal. 180 tablet 1  . metoprolol succinate (TOPROL-XL) 25 MG 24 hr tablet TAKE 1 TABLET (25 MG TOTAL) BY MOUTH DAILY. 90 tablet 3  . metroNIDAZOLE (METROGEL) 1 % gel Apply 1 application topically 2 (two) times daily.     . Multiple Vitamin (MULTIVITAMIN WITH MINERALS) TABS tablet Take 1 tablet by mouth daily.    . naproxen sodium (ANAPROX) 220 MG tablet Take 220 mg by mouth daily as needed (pain).    . nitroGLYCERIN (NITROSTAT) 0.4 MG SL tablet Place 0.4 mg under the tongue every 5 (five) minutes as needed for chest  pain.    Marland Kitchen omeprazole (PRILOSEC) 20 MG capsule TAKE 1 CAPSULE BY MOUTH EVERY DAY 30 capsule 3  . pravastatin (PRAVACHOL) 40 MG tablet TAKE 1 TABLET (40 MG TOTAL) BY MOUTH DAILY. 90 tablet 3  . vitamin B-12 (CYANOCOBALAMIN) 500 MCG tablet Take 500 mcg by mouth daily.    Marland Kitchen zolpidem (AMBIEN CR) 6.25 MG CR tablet Take 1 tablet (6.25 mg total) by mouth at bedtime as needed for sleep. 30 tablet 0   No current facility-administered medications for this visit.     Allergies:   Ambien [zolpidem tartrate]; Atorvastatin; Belsomra [suvorexant]; Penicillins; Tamsulosin; and Doxycycline    Social History:  The patient  reports that he quit smoking about 54 years ago. He has never used smokeless tobacco. He reports that he drinks alcohol. He reports that he does not use drugs.   Family History:  The patient's family history includes CAD in his brother; CAD (age of onset: 52) in his brother; COPD in his brother; Cancer in his mother; Cancer (age of onset: 59) in his father; Parkinson's disease in his brother; Stroke in his sister.    ROS:  Please see the history of present illness.   Otherwise, review of systems are positive for none.   All other systems are reviewed and negative.    PHYSICAL EXAM: VS:  BP 120/62 (BP Location: Left Arm, Patient Position: Sitting, Cuff Size: Normal)   Pulse (!) 54   Ht 5\' 8"  (1.727 m)   Wt 187 lb (84.8 kg)   BMI 28.43 kg/m  , BMI Body mass index is 28.43 kg/m. GEN: Well nourished, well developed, in no acute distress  HEENT: normal  Neck: no JVD, carotid bruits, or masses Cardiac: RRR; no rubs, or gallops,no edema . There is a 2/6 systolic ejection murmur in the aortic area Respiratory:  clear to auscultation bilaterally, normal work of breathing GI: soft, nontender, nondistended, + BS MS: no deformity or atrophy  Skin: warm and dry, no rash Neuro:  Strength and sensation are intact Psych: euthymic mood, full affect   EKG:  EKG is ordered today. The ekg  ordered today demonstrates normal sinus rhythm with old inferior infarct.   Recent Labs: 12/11/2016: ALT 17; BUN 17; Creatinine, Ser 1.16; Potassium 4.8; Sodium 136    Lipid Panel    Component Value Date/Time   CHOL 121 12/11/2016 0830   CHOL 124 05/04/2014 0806   TRIG 110.0 12/11/2016 0830   HDL 47.80 12/11/2016 0830   HDL 53 05/04/2014 0806   CHOLHDL 3 12/11/2016 0830   VLDL 22.0 12/11/2016 0830   LDLCALC 51 12/11/2016 0830   LDLCALC 48 05/04/2014 0806      Wt Readings from Last 3 Encounters:  04/09/17 187 lb (84.8 kg)  12/17/16 192 lb 12.8 oz (87.5 kg)  10/03/16 185 lb (83.9 kg)       ASSESSMENT AND PLAN:  1.  Coronary artery disease involving native coronary arteries without angina: Overall, he is doing well with no anginal symptoms. I recommend continuing medical therapy.  2. Aortic sclerosis without stenosis: The murmur has not changed over the last year. Continue to monitor clinically.  3. Hyperlipidemia: I reviewed most recent lipid profile in January which showed an LDL of 51. Continue treatment with pravastatin.   Disposition:   FU with me in 1 year  Signed,  Kathlyn Sacramento, MD  04/09/2017 10:41 AM    [place]

## 2017-04-26 ENCOUNTER — Other Ambulatory Visit: Payer: Self-pay | Admitting: Cardiovascular Disease

## 2017-05-21 DIAGNOSIS — H903 Sensorineural hearing loss, bilateral: Secondary | ICD-10-CM | POA: Diagnosis not present

## 2017-05-21 DIAGNOSIS — H6123 Impacted cerumen, bilateral: Secondary | ICD-10-CM | POA: Diagnosis not present

## 2017-06-07 DIAGNOSIS — Z08 Encounter for follow-up examination after completed treatment for malignant neoplasm: Secondary | ICD-10-CM | POA: Diagnosis not present

## 2017-06-07 DIAGNOSIS — X32XXXA Exposure to sunlight, initial encounter: Secondary | ICD-10-CM | POA: Diagnosis not present

## 2017-06-07 DIAGNOSIS — Z85828 Personal history of other malignant neoplasm of skin: Secondary | ICD-10-CM | POA: Diagnosis not present

## 2017-06-07 DIAGNOSIS — L718 Other rosacea: Secondary | ICD-10-CM | POA: Diagnosis not present

## 2017-06-07 DIAGNOSIS — L570 Actinic keratosis: Secondary | ICD-10-CM | POA: Diagnosis not present

## 2017-06-24 ENCOUNTER — Encounter: Payer: Self-pay | Admitting: Family Medicine

## 2017-06-24 ENCOUNTER — Ambulatory Visit (INDEPENDENT_AMBULATORY_CARE_PROVIDER_SITE_OTHER): Payer: Medicare Other | Admitting: Family Medicine

## 2017-06-24 VITALS — BP 124/68 | HR 78 | Temp 97.6°F | Wt 184.5 lb

## 2017-06-24 DIAGNOSIS — E119 Type 2 diabetes mellitus without complications: Secondary | ICD-10-CM | POA: Diagnosis not present

## 2017-06-24 DIAGNOSIS — L719 Rosacea, unspecified: Secondary | ICD-10-CM | POA: Diagnosis not present

## 2017-06-24 DIAGNOSIS — G3184 Mild cognitive impairment, so stated: Secondary | ICD-10-CM | POA: Diagnosis not present

## 2017-06-24 DIAGNOSIS — I2510 Atherosclerotic heart disease of native coronary artery without angina pectoris: Secondary | ICD-10-CM | POA: Diagnosis not present

## 2017-06-24 DIAGNOSIS — F5101 Primary insomnia: Secondary | ICD-10-CM

## 2017-06-24 LAB — HEMOGLOBIN A1C: HEMOGLOBIN A1C: 6.8 % — AB (ref 4.6–6.5)

## 2017-06-24 MED ORDER — DOXEPIN HCL 6 MG PO TABS: 1.0000 | ORAL_TABLET | Freq: Every day | ORAL | 3 refills | Status: DC

## 2017-06-24 NOTE — Addendum Note (Signed)
Addended by: Ellamae Sia on: 06/24/2017 11:31 AM   Modules accepted: Orders

## 2017-06-24 NOTE — Assessment & Plan Note (Signed)
Followed by derm.

## 2017-06-24 NOTE — Assessment & Plan Note (Signed)
lunesta no longer covered by insurance.  ambien CR not very effective. Will re-trial silenor 6mg  nightly for sleep maintenance insomnia.  Consider combination of meds as current regimen ineffective.

## 2017-06-24 NOTE — Patient Instructions (Addendum)
Continue current aricept dose. Trial silenor 6mg  nightly - sent to pharmacy. Try in place of ambien.  You are doing well today Labs today. Return in 6 months for next medicare wellness visit.

## 2017-06-24 NOTE — Progress Notes (Signed)
BP 124/68   Pulse 78   Temp 97.6 F (36.4 C) (Oral)   Wt 184 lb 8 oz (83.7 kg)   SpO2 97%   BMI 28.05 kg/m    CC: 59mo f/u visit Subjective:    Patient ID: Stephen [name], male    DOB: June 20, 1927, 83 y.o.   MRN: [id number]  HPI: Stephen [name] is a 83 y.o. male presenting on 06/24/2017 for Follow-up   Here with partner Stephen [name].  Saw dermatologist and currently receiving treatment with 5FU. Has rosacea.   Chronic sleep maintenance insomnia - longterm treated with lunesta until insurance stopped covering earlier this year so we switched to [place] CR. Has previously tried [place] IR and CR, melatonin, rozerem ineffective, belsomra intolerable, silenor didn't help. Reviewed sleep hygiene.   See prior note for details. Last visit we discussed aricept for mild cognitive impairment. He is tolerating med well. He would like to stay at this dose.   From prior visit: Mental Status Exam: 24-26/30 (value/max value)  Missed 2 points orientation, 1/3 recall, 3/3 with cue, 2 points in language.  Relevant past medical, surgical, family and social history reviewed and updated as indicated. Interim medical history since our last visit reviewed. Allergies and medications reviewed and updated. Outpatient Medications Prior to Visit  Medication Sig Dispense Refill  . aspirin 81 MG EC tablet TAKE 1 TABLET (81 MG TOTAL) BY MOUTH DAILY. 30 tablet 6  . donepezil (ARICEPT) 5 MG tablet Take 1 tablet (5 mg total) by mouth at bedtime. 30 tablet 6  . doxazosin (CARDURA) 1 MG tablet TAKE 1 TABLET BY MOUTH EVERY DAY 30 tablet 11  . FIBER, CORN DEXTRIN, PO Take by mouth.    . finasteride (PROSCAR) 5 MG tablet TAKE 1 TABLET BY MOUTH DAILY. 90 tablet 1  . metFORMIN (GLUCOPHAGE) 500 MG tablet Take 1 tablet (500 mg total) by mouth 2 (two) times daily with a meal. 180 tablet 1  . metoprolol succinate (TOPROL-XL) 25 MG 24 hr tablet TAKE 1 TABLET (25 MG TOTAL) BY MOUTH DAILY. 90 tablet 0  . Multiple Vitamin  (MULTIVITAMIN WITH MINERALS) TABS tablet Take 1 tablet by mouth daily.    . naproxen sodium (ANAPROX) 220 MG tablet Take 220 mg by mouth daily as needed (pain).    . nitroGLYCERIN (NITROSTAT) 0.4 MG SL tablet Place 0.4 mg under the tongue every 5 (five) minutes as needed for chest pain.    Marland Kitchen omeprazole (PRILOSEC) 20 MG capsule TAKE 1 CAPSULE BY MOUTH EVERY DAY 30 capsule 3  . pravastatin (PRAVACHOL) 40 MG tablet TAKE 1 TABLET (40 MG TOTAL) BY MOUTH DAILY. 90 tablet 3  . vitamin B-12 (CYANOCOBALAMIN) 500 MCG tablet Take 500 mcg by mouth daily.    Marland Kitchen zolpidem (AMBIEN CR) 6.25 MG CR tablet Take 1 tablet (6.25 mg total) by mouth at bedtime as needed for sleep. 30 tablet 0  . metroNIDAZOLE (METROGEL) 1 % gel Apply 1 application topically 2 (two) times daily.      No facility-administered medications prior to visit.      Per HPI unless specifically indicated in ROS section below Review of Systems     Objective:    BP 124/68   Pulse 78   Temp 97.6 F (36.4 C) (Oral)   Wt 184 lb 8 oz (83.7 kg)   SpO2 97%   BMI 28.05 kg/m   Wt Readings from Last 3 Encounters:  06/24/17 184 lb 8 oz (83.7 kg)  04/09/17 187 lb (  84.8 kg)  12/17/16 192 lb 12.8 oz (87.5 kg)    Physical Exam  Constitutional: He appears well-developed and well-nourished. No distress.  HENT:  Head: Normocephalic and atraumatic.  Right Ear: External ear normal.  Left Ear: External ear normal.  Nose: Nose normal.  Mouth/Throat: Oropharynx is clear and moist. No oropharyngeal exudate.  Eyes: Pupils are equal, round, and reactive to light. Conjunctivae and EOM are normal. No scleral icterus.  Neck: Normal range of motion. Neck supple.  Cardiovascular: Normal rate, regular rhythm and intact distal pulses.   Murmur (3/6 SEM) heard. Pulmonary/Chest: Effort normal and breath sounds normal. No respiratory distress. He has no wheezes. He has no rales.  Musculoskeletal: He exhibits no edema.  See HPI for foot exam if done    Lymphadenopathy:    He has no cervical adenopathy.  Skin: Skin is warm and dry. No rash noted.  Psychiatric: He has a normal mood and affect.  Nursing note and vitals reviewed.  Results for orders placed or performed in visit on 12/11/16  Lipid panel  Result Value Ref Range   Cholesterol 121 0 - 200 mg/dL   Triglycerides 110.0 0.0 - 149.0 mg/dL   HDL 47.80 >39.00 mg/dL   VLDL 22.0 0.0 - 40.0 mg/dL   LDL Cholesterol 51 0 - 99 mg/dL   Total CHOL/HDL Ratio 3    NonHDL 73.13   Comprehensive metabolic panel  Result Value Ref Range   Sodium 136 135 - 145 mEq/L   Potassium 4.8 3.5 - 5.1 mEq/L   Chloride 102 96 - 112 mEq/L   CO2 31 19 - 32 mEq/L   Glucose, Bld 116 (H) 70 - 99 mg/dL   BUN 17 6 - 23 mg/dL   Creatinine, Ser 1.16 0.40 - 1.50 mg/dL   Total Bilirubin 0.6 0.2 - 1.2 mg/dL   Alkaline Phosphatase 50 39 - 117 U/L   AST 25 0 - 37 U/L   ALT 17 0 - 53 U/L   Total Protein 7.0 6.0 - 8.3 g/dL   Albumin 4.0 3.5 - 5.2 g/dL   Calcium 9.5 8.4 - 10.5 mg/dL   GFR 63.27 >60.00 mL/min  Hemoglobin A1c  Result Value Ref Range   Hgb A1c MFr Bld 6.6 (H) 4.6 - 6.5 %  Microalbumin / creatinine urine ratio  Result Value Ref Range   Microalb, Ur 2.6 (H) 0.0 - 1.9 mg/dL   Creatinine,U 129.9 mg/dL   Microalb Creat Ratio 2.0 0.0 - 30.0 mg/g      Assessment & Plan:   Problem List Items Addressed This Visit    Diabetes type 2, controlled (HCC)    Chronic, stable. Update A1c.       Relevant Orders   Hemoglobin A1c   Insomnia - Primary    lunesta no longer covered by insurance.  ambien CR not very effective. Will re-trial silenor 6mg  nightly for sleep maintenance insomnia.  Consider combination of meds as current regimen ineffective.       MCI (mild cognitive impairment) with memory loss    Stable period. Stephen [name] feels he is doing better on aricept. They desire to continue at current dose.       Rosacea    Followed by derm.          Follow up plan: Return in about 6 months  (around 12/25/2017) for annual exam, prior fasting for blood work.  Ria Bush, MD

## 2017-06-24 NOTE — Assessment & Plan Note (Signed)
Chronic, stable. Update A1c.

## 2017-06-24 NOTE — Assessment & Plan Note (Signed)
Stable period. Stephen [name] feels he is doing better on aricept. They desire to continue at current dose.

## 2017-06-27 ENCOUNTER — Encounter: Payer: Self-pay | Admitting: Family Medicine

## 2017-06-27 DIAGNOSIS — F5101 Primary insomnia: Secondary | ICD-10-CM

## 2017-06-27 MED ORDER — ZOLPIDEM TARTRATE ER 6.25 MG PO TBCR: 6.2500 mg | EXTENDED_RELEASE_TABLET | Freq: Every evening | ORAL | 3 refills | Status: DC | PRN

## 2017-06-27 NOTE — Telephone Encounter (Signed)
Please phone in [place] for patient.

## 2017-06-27 NOTE — Telephone Encounter (Signed)
Rx called in to requested pharmacy 

## 2017-07-17 ENCOUNTER — Other Ambulatory Visit: Payer: Self-pay | Admitting: Family Medicine

## 2017-07-23 ENCOUNTER — Other Ambulatory Visit: Payer: Self-pay | Admitting: Cardiovascular Disease

## 2017-08-01 ENCOUNTER — Encounter: Payer: Self-pay | Admitting: Family Medicine

## 2017-08-01 ENCOUNTER — Ambulatory Visit (INDEPENDENT_AMBULATORY_CARE_PROVIDER_SITE_OTHER): Payer: Medicare Other

## 2017-08-01 DIAGNOSIS — Z23 Encounter for immunization: Secondary | ICD-10-CM

## 2017-08-05 ENCOUNTER — Encounter: Payer: Self-pay | Admitting: Family Medicine

## 2017-08-06 MED ORDER — MIRTAZAPINE 15 MG PO TABS
15.0000 mg | ORAL_TABLET | Freq: Every day | ORAL | 1 refills | Status: DC
Start: 2017-08-06 — End: 2017-08-16

## 2017-08-07 ENCOUNTER — Encounter: Payer: Self-pay | Admitting: Family Medicine

## 2017-08-09 ENCOUNTER — Encounter: Payer: Self-pay | Admitting: Family Medicine

## 2017-08-09 DIAGNOSIS — F5101 Primary insomnia: Secondary | ICD-10-CM

## 2017-08-16 ENCOUNTER — Encounter: Payer: Self-pay | Admitting: Family Medicine

## 2017-08-16 ENCOUNTER — Ambulatory Visit (INDEPENDENT_AMBULATORY_CARE_PROVIDER_SITE_OTHER): Payer: Medicare Other | Admitting: Family Medicine

## 2017-08-16 VITALS — BP 118/68 | HR 66 | Temp 97.9°F | Wt 187.2 lb

## 2017-08-16 DIAGNOSIS — I2510 Atherosclerotic heart disease of native coronary artery without angina pectoris: Secondary | ICD-10-CM

## 2017-08-16 DIAGNOSIS — G3184 Mild cognitive impairment, so stated: Secondary | ICD-10-CM | POA: Diagnosis not present

## 2017-08-16 DIAGNOSIS — F5101 Primary insomnia: Secondary | ICD-10-CM

## 2017-08-16 MED ORDER — ZOLPIDEM TARTRATE ER 6.25 MG PO TBCR: 6.2500 mg | EXTENDED_RELEASE_TABLET | Freq: Every evening | ORAL | 0 refills | Status: DC | PRN

## 2017-08-16 NOTE — Patient Instructions (Addendum)
For now, let's stop remeron and restart ambien CR 6.25mg  nightly. See Rosaria Ferries for referral for sleep doctor neurologist.  If unrevealing evaluation then we may start restoril (temazepam) for sleep.

## 2017-08-16 NOTE — Assessment & Plan Note (Signed)
Chronic insomnia. Stephen [name] was most effective but insurance stopped covering this. Has tried and failed multiple meds in the past. Discussed possible benzo along with its risks. Will refer to sleep doctor for evaluation of chronic insomnia. Will consider starting restoril for chronic sleep maintenance insomnia.

## 2017-08-16 NOTE — Progress Notes (Signed)
BP 118/68 (BP Location: Left Arm, Patient Position: Sitting, Cuff Size: Normal)   Pulse 66   Temp 97.9 F (36.6 C) (Oral)   Wt 187 lb 4 oz (84.9 kg)   SpO2 97%   BMI 28.47 kg/m    CC: chronic insomnia Subjective:    Patient ID: Stephen [name], male    DOB: May 05, 1927, 83 y.o.   MRN: [id number]  HPI: Stephen [name] is a 83 y.o. male presenting on 08/16/2017 for Insomnia (Having trouble staying asleep. Started about 2 wks ago after starting Remron)   Chronic issue with insomnia. See recent phone notes.  Latest trial remeron 15mg  then 7.5mg  for sleep - no better.  Long-term treatment with lunesta with best effect to date, then insurance stopped covering this year.   He does snore but no witnessed apnea.   Has previously tried [employer] and CR (ineffective), sonata, xanax, melatonin, rozerem ineffective, belsomra intolerable, silenor didn't help, trazodone and now mirtazapine (increased anxiety). Has tried benadryl.  We have previously reviewed sleep hygiene.   Bedtime is at 10pm. 30 min to fall asleep. Averages 3 hours of sleep then wakes up and has trouble falling back asleep. Gets up at 6:30am every morning. No alcohol at bedtime. Avoids anything mentally stimulating at bedtime.   Relevant past medical, surgical, family and social history reviewed and updated as indicated. Interim medical history since our last visit reviewed. Allergies and medications reviewed and updated. Outpatient Medications Prior to Visit  Medication Sig Dispense Refill  . aspirin 81 MG EC tablet TAKE 1 TABLET (81 MG TOTAL) BY MOUTH DAILY. 30 tablet 6  . donepezil (ARICEPT) 5 MG tablet TAKE 1 TABLET (5 MG TOTAL) BY MOUTH AT BEDTIME. 30 tablet 6  . doxazosin (CARDURA) 1 MG tablet TAKE 1 TABLET BY MOUTH EVERY DAY 30 tablet 11  . FIBER, CORN DEXTRIN, PO Take by mouth.    . finasteride (PROSCAR) 5 MG tablet TAKE 1 TABLET BY MOUTH DAILY. 90 tablet 1  . Ivermectin 1 % CREA Apply topically. Apply to face  once a day    . metFORMIN (GLUCOPHAGE) 500 MG tablet Take 1 tablet (500 mg total) by mouth 2 (two) times daily with a meal. 180 tablet 1  . metoprolol succinate (TOPROL-XL) 25 MG 24 hr tablet TAKE 1 TABLET BY MOUTH EVERY DAY 90 tablet 0  . Multiple Vitamin (MULTIVITAMIN WITH MINERALS) TABS tablet Take 1 tablet by mouth daily.    . naproxen sodium (ANAPROX) 220 MG tablet Take 220 mg by mouth daily as needed (pain).    . nitroGLYCERIN (NITROSTAT) 0.4 MG SL tablet Place 0.4 mg under the tongue every 5 (five) minutes as needed for chest pain.    Marland Kitchen omeprazole (PRILOSEC) 20 MG capsule TAKE 1 CAPSULE BY MOUTH EVERY DAY 30 capsule 6  . pravastatin (PRAVACHOL) 40 MG tablet TAKE 1 TABLET (40 MG TOTAL) BY MOUTH DAILY. 90 tablet 3  . vitamin B-12 (CYANOCOBALAMIN) 500 MCG tablet Take 500 mcg by mouth daily.    . mirtazapine (REMERON) 15 MG tablet Take 1 tablet (15 mg total) by mouth at bedtime. 30 tablet 1  . zolpidem (AMBIEN CR) 6.25 MG CR tablet Take 1 tablet (6.25 mg total) by mouth at bedtime as needed for sleep. 30 tablet 3   No facility-administered medications prior to visit.     Past Medical History:  Diagnosis Date  . Arthritis of knee   . BPH (benign prostatic hypertrophy)    w/ nocturia  .  Coronary artery disease 03/2014   Inferior ST elevation myocardial infarction. Cardiac catheterization showed an occluded mid RCA. He had an angioplasty and drug-eluting stent placement with a 3.0 x 16 mm Promus drug-eluting stent. Ejection fraction was 45% by echo, completed cardiac rehab 06/2014  . Diabetes type 2, controlled ([place])   . History of basal cell cancer    s/p mohs  . History of colon polyps   . History of hypertension   . HLD (hyperlipidemia)    diet controlled in past  . Hypertension   . Insomnia    treated with multiple meds in past  . MI (myocardial infarction) ([place])   . Rosacea   . Skin cancer   . UTI (urinary tract infection)     Past Surgical History:  Procedure Laterality Date   . CARDIAC CATHETERIZATION  03/2014   [facility];x1 stent  . CATARACT EXTRACTION W/PHACO Left 10/09/2016   Procedure: CATARACT EXTRACTION PHACO AND INTRAOCULAR LENS PLACEMENT (IOC);  Surgeon: Birder Robson, MD;  Location: [facility] ORS;  Service: Ophthalmology;  Laterality: Left;  [place] 1.13 AP% 18.3 CDE 13.45 Fluid pack lot # [id number]  . COLONOSCOPY  08/2010   hyperplastic polyp, rec rpt 5 yrs  . ESOPHAGOGASTRODUODENOSCOPY  05/2015   dilated stricture, normal biopsies, HH, no definite infection [name] @ [facility])  . MOHS SURGERY     basal cell chin/back  . REPLACEMENT TOTAL KNEE Left 08/2006  . VASECTOMY  1970    Social History  Substance Use Topics  . Smoking status: Former Smoker    Quit date: 11/12/1962  . Smokeless tobacco: Never Used     Comment: Quit 1964  . Alcohol use Yes     Comment: 1 beer/day    Family History  Problem Relation Age of Onset  . Cancer Father 80       prostate and colon  . Cancer Mother        ovarian or uterine  . CAD Brother 38       MI, smoker  . Parkinson's disease Brother   . COPD Brother   . CAD Brother        MI  . Stroke Sister     Per HPI unless specifically indicated in ROS section below Review of Systems     Objective:    BP 118/68 (BP Location: Left Arm, Patient Position: Sitting, Cuff Size: Normal)   Pulse 66   Temp 97.9 F (36.6 C) (Oral)   Wt 187 lb 4 oz (84.9 kg)   SpO2 97%   BMI 28.47 kg/m   Wt Readings from Last 3 Encounters:  08/16/17 187 lb 4 oz (84.9 kg)  06/24/17 184 lb 8 oz (83.7 kg)  04/09/17 187 lb (84.8 kg)    Physical Exam  Constitutional: He appears well-developed and well-nourished. No distress.  Psychiatric: He has a normal mood and affect.  Nursing note and vitals reviewed.  Results for orders placed or performed in visit on 06/24/17  Hemoglobin A1c  Result Value Ref Range   Hgb A1c MFr Bld 6.8 (H) 4.6 - 6.5 %      Assessment & Plan:   Problem List Items Addressed This Visit    Insomnia - Primary     Chronic insomnia. lunesta was most effective but insurance stopped covering this. Has tried and failed multiple meds in the past. Discussed possible benzo along with its risks. Will refer to sleep doctor for evaluation of chronic insomnia. Will consider starting restoril for chronic sleep maintenance  insomnia.       Relevant Orders   Ambulatory referral to Neurology   MCI (mild cognitive impairment) with memory loss    Stable period on low dose aricept. Continue.           Follow up plan: No Follow-up on file.  Ria Bush, MD

## 2017-08-16 NOTE — Assessment & Plan Note (Addendum)
Stable period on low dose aricept. Continue.

## 2017-08-17 ENCOUNTER — Other Ambulatory Visit: Payer: Self-pay | Admitting: Family Medicine

## 2017-09-25 ENCOUNTER — Institutional Professional Consult (permissible substitution): Payer: Self-pay | Admitting: Neurology

## 2017-09-25 ENCOUNTER — Telehealth: Payer: Self-pay | Admitting: Neurology

## 2017-09-25 ENCOUNTER — Ambulatory Visit (INDEPENDENT_AMBULATORY_CARE_PROVIDER_SITE_OTHER): Payer: Medicare Other | Admitting: Neurology

## 2017-09-25 ENCOUNTER — Encounter: Payer: Self-pay | Admitting: Neurology

## 2017-09-25 VITALS — BP 113/72 | HR 72 | Ht 68.0 in | Wt 191.0 lb

## 2017-09-25 DIAGNOSIS — R351 Nocturia: Secondary | ICD-10-CM

## 2017-09-25 DIAGNOSIS — I2510 Atherosclerotic heart disease of native coronary artery without angina pectoris: Secondary | ICD-10-CM | POA: Diagnosis not present

## 2017-09-25 DIAGNOSIS — F5104 Psychophysiologic insomnia: Secondary | ICD-10-CM | POA: Diagnosis not present

## 2017-09-25 DIAGNOSIS — E1122 Type 2 diabetes mellitus with diabetic chronic kidney disease: Secondary | ICD-10-CM

## 2017-09-25 DIAGNOSIS — N181 Chronic kidney disease, stage 1: Secondary | ICD-10-CM | POA: Diagnosis not present

## 2017-09-25 DIAGNOSIS — R0683 Snoring: Secondary | ICD-10-CM | POA: Diagnosis not present

## 2017-09-25 DIAGNOSIS — Z9861 Coronary angioplasty status: Secondary | ICD-10-CM

## 2017-09-25 MED ORDER — ESZOPICLONE 2 MG PO TABS: 2.0000 mg | ORAL_TABLET | Freq: Every evening | ORAL | 0 refills | Status: DC | PRN

## 2017-09-25 NOTE — Progress Notes (Signed)
SLEEP MEDICINE CLINIC   Provider:  Larey Seat, M D  Primary Care Physician:  Ria Bush, MD   Referring Provider: Ria Bush, MD   Chief Complaint  Patient presents with  . New Patient (Initial Visit)    pt is with partner,  states not sleeping well, wakes up all the time. pt states 2-3 hours of uninterupted sleep.    Here with his companion, Mrs. Ruthann Cancer.   HPI:  Stephen [name] is a 83 y.o. male , seen here in a referral from Dr. Danise Mina for chronic Insomnia,  a condition he has suffered of since at least 20 years.   The patient had failed multiple medications treating insomnia, including Ambien which left him confused, Remeron which did not improve his sleep and actually caused terrible nightmares, melatonin was also without effect, he tried Sonata without success, he has not been placed on primary benzodiazepines. The best result was achieved by using Lunesta which his insurance now fails to cover.  He tried 2 mg with good success.  The patient has an elevated hemoglobin A1c indicative of the presence of diabetes mellitus, he has a history of coronary artery disease, had a cardiac stent placed in May 2015, he is status post cataract extraction, has hypertension, colon polyps, history of basal cell cancer, arthritis, nocturia with benign prostate hyperplasia, and had a myocardial infarction in 2015. He has never been evaluated for insomnia, he has had a sleep study at [facility] while living in [place], [place] . He was told that no apnea was found.    Sleep habits are as follows: His usual bedtime is around 10 PM, after watching TV and the patient usually does not fall asleep after dinner. He reports that his bedroom is cool, quiet and dark in conducive to sleep.  He usually sleeps on his side, he has one pillow for head support. He does have frequent bathroom breaks, the first one around 2 AM, followed by 3.30 and 5 AM.  His other fragmentation of  sleep that he cannot identify.  He seems to arouse spontaneously without discomfort, chest pain, shortness of breath, nausea.  He reports being frustrated not being able to sleep well and sustained. He spontaneously wakes up around 6:30 AM does not need an alarm, and starts his day at this time.  Sleep medical history and family sleep history: There is no family history of apnea, insomnia, sleep terrors or sleepwalking, enuresis.  Social history: Lives with Mrs. Ruthann Cancer for 19 years, he worked for the [employer]. Research scientist (medical).  She used to be a smoker but quit in 1964, ETOH - one beer a day, 5 days a week. Caffeine; 2 mugs of coffee, decaffeinated tea in PM , one cup, no soda, no energy drinks.   Review of Systems: Out of a complete 14 system review, the patient complains of only the following symptoms, and all other reviewed systems are negative.  Snoring, EDS, fights naps in daytime.   Epworth score  13 points  , Fatigue severity score 33 , depression score 2/15   Social History   Socioeconomic History  . Marital status: Widowed    Spouse name: Not on file  . Number of children: Not on file  . Years of education: Not on file  . Highest education level: Not on file  Social Needs  . Financial resource strain: Not on file  . Food insecurity - worry: Not on file  . Food insecurity -  inability: Not on file  . Transportation needs - medical: Not on file  . Transportation needs - non-medical: Not on file  Occupational History  . Not on file  Tobacco Use  . Smoking status: Former Smoker    Last attempt to quit: 11/12/1962    Years since quitting: 54.9  . Smokeless tobacco: Never Used  . Tobacco comment: Quit 1964  Substance and Sexual Activity  . Alcohol use: Yes    Comment: 1 beer/day  . Drug use: No  . Sexual activity: Not on file  Other Topics Concern  . Not on file  Social History Narrative   Lives at [facility] with friend - Osvaldo Angst RN.   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 Vegetarian.    Family History  Problem Relation Age of Onset  . Cancer Father 17       prostate and colon  . Cancer Mother        ovarian or uterine  . CAD Brother 56       MI, smoker  . Parkinson's disease Brother   . COPD Brother   . CAD Brother        MI  . Stroke Sister     Past Medical History:  Diagnosis Date  . Arthritis of knee   . BPH (benign prostatic hypertrophy)    w/ nocturia  . Coronary artery disease 03/2014   Inferior ST elevation myocardial infarction. Cardiac catheterization showed an occluded mid RCA. He had an angioplasty and drug-eluting stent placement with a 3.0 x 16 mm Promus drug-eluting stent. Ejection fraction was 45% by echo, completed cardiac rehab 06/2014  . Diabetes type 2, controlled ([place])   . History of basal cell cancer    s/p mohs  . History of colon polyps   . History of hypertension   . HLD (hyperlipidemia)    diet controlled in past  . Hypertension   . Insomnia    treated with multiple meds in past  . MI (myocardial infarction) ([place])   . Rosacea   . Skin cancer   . UTI (urinary tract infection)     Past Surgical History:  Procedure Laterality Date  . CARDIAC CATHETERIZATION  03/2014   [facility];x1 stent  . COLONOSCOPY  08/2010   hyperplastic polyp, rec rpt 5 yrs  . ESOPHAGOGASTRODUODENOSCOPY  05/2015   dilated stricture, normal biopsies, HH, no definite infection [name] @ [facility])  . MOHS SURGERY     basal cell chin/back  . REPLACEMENT TOTAL KNEE Left 08/2006  . VASECTOMY  1970    Current Outpatient Medications  Medication Sig Dispense Refill  . aspirin 81 MG EC tablet TAKE 1 TABLET (81 MG TOTAL) BY MOUTH DAILY. 30 tablet 6  . donepezil (ARICEPT) 5 MG tablet TAKE 1 TABLET (5 MG TOTAL) BY MOUTH AT BEDTIME. 30 tablet 6  . doxazosin (CARDURA) 1  MG tablet TAKE 1 TABLET BY MOUTH EVERY DAY 30 tablet 11  . FIBER, CORN DEXTRIN, PO Take by mouth.    . finasteride (PROSCAR) 5 MG tablet TAKE 1 TABLET BY MOUTH EVERY DAY 90 tablet 1  . Ivermectin 1 % CREA Apply topically. Apply to face once a day    . metFORMIN (GLUCOPHAGE) 500 MG tablet Take 1 tablet (500 mg total) by mouth 2 (two) times daily  with a meal. 180 tablet 1  . metoprolol succinate (TOPROL-XL) 25 MG 24 hr tablet TAKE 1 TABLET BY MOUTH EVERY DAY 90 tablet 0  . Multiple Vitamin (MULTIVITAMIN WITH MINERALS) TABS tablet Take 1 tablet by mouth daily.    . naproxen sodium (ANAPROX) 220 MG tablet Take 220 mg by mouth daily as needed (pain).    . nitroGLYCERIN (NITROSTAT) 0.4 MG SL tablet Place 0.4 mg under the tongue every 5 (five) minutes as needed for chest pain.    Marland Kitchen omeprazole (PRILOSEC) 20 MG capsule TAKE 1 CAPSULE BY MOUTH EVERY DAY 30 capsule 6  . pravastatin (PRAVACHOL) 40 MG tablet TAKE 1 TABLET (40 MG TOTAL) BY MOUTH DAILY. 90 tablet 3  . vitamin B-12 (CYANOCOBALAMIN) 500 MCG tablet Take 500 mcg by mouth daily.    Marland Kitchen zolpidem (AMBIEN CR) 6.25 MG CR tablet Take 1 tablet (6.25 mg total) by mouth at bedtime as needed for sleep. 30 tablet 0   No current facility-administered medications for this visit.     Allergies as of 09/25/2017 - Review Complete 09/25/2017  Allergen Reaction Noted  . Ambien [zolpidem tartrate]  10/03/2016  . Atorvastatin  07/02/2014  . Belsomra [suvorexant] Other (See Comments) 11/29/2014  . Penicillins Hives 07/07/2013  . Tamsulosin Other (See Comments) 10/23/2012  . Doxycycline Rash 04/01/2014    Vitals: BP 113/72   Pulse 72   Ht 5\' 8"  (1.727 m)   Wt 191 lb (86.6 kg)   BMI 29.04 kg/m  Last Weight:  Wt Readings from Last 1 Encounters:  09/25/17 191 lb (86.6 kg)   SWF:UXNA mass index is 29.04 kg/m.     Last Height:   Ht Readings from Last 1 Encounters:  09/25/17 5\' 8"  (1.727 m)    Physical exam:  General: The patient is awake, alert and  appears not in acute distress. The patient is well groomed. Head: Normocephalic, atraumatic. Neck is supple. Mallampati 3-he does have an elongated uvula that is not reddened but looks puffy.,  Rhinophyma.  neck circumference:17.75 . Nasal airflow patent , Retrognathia is seen.  Cardiovascular:  Regular rate and rhythm-, without  murmurs or carotid bruit, and without distended neck veins. Respiratory: Lungs are clear to auscultation. Skin:  Without evidence of edema, or rash Trunk: BMI is 29. The patient's posture is erect    Neurologic exam : The patient is awake and alert, oriented to place and time. Attention span & concentration ability appears normal.  Speech is fluent,  without  dysarthria, dysphonia or aphasia.  Mood and affect are appropriate.  Cranial nerves: Pupils are equal and briskly reactive to light. Funduscopic exam without evidence of pallor or edema.  Extraocular movements  in vertical and horizontal planes intact and without nystagmus. Visual fields by finger perimetry are intact. Hearing to finger rub intact.  Facial sensation intact to fine touch. Facial motor strength is symmetric and tongue and uvula move midline. Shoulder shrug was symmetrical.   Motor exam:  Normal tone, muscle bulk and symmetric strength in all extremities. Sensory:  Fine touch, pinprick and vibration were tested in all extremities. Proprioception tested in the upper extremities was normal. Coordination: Rapid alternating movements / Finger-to-nose maneuver  normal without evidence of ataxia, dysmetria or tremor.  Gait and station: Patient walks without assistive device  Deep tendon reflexes: in the  upper and lower extremities are symmetric and intact.  Assessment:  After physical and neurologic examination, review of laboratory studies,  Personal review of imaging studies, reports of  other /same  Imaging studies, results of polysomnography and / or neurophysiology testing and pre-existing records  as far as provided in visit., my assessment is   1) Mr. Gulla has been described as a snorer, but apneas have not been witnessed. He is clearly excessively daytime sleepy which may be related to nocturnal sleep deprivation, in the setting of chronic insomnia.  An organic reason was not found when he underwent sleep testing before at [facility].  However he does have a hiatal hernia, he seems to have excessive soft tissue with some swelling, and he is noted to snore so an apnea evaluation is due.  Should we find apnea it does not mean that we need to treat it with CPAP of been a dental device is sufficient.  2) I will invite Mr. Geiler for a sleep study, split-night polysomnography.  As to his insomnia, chronic insomnia is best treated by cognitive behavioral therapy.  #2 no free have organic reasons for insomnia as a first step.  I would not be opposed to provide Lunesta to have the opportunity to see him sleep at all.  3) frequent nocturia is an additional factor in his insomnia, he may need to reduce his fluid intake after 6 PM and not later to reduce nocturnal bathroom breaks.   The patient was advised of the nature of the diagnosed disorder , the treatment options and the  risks for general health and wellness arising from not treating the condition.   I spent more than 45 minutes of face to face time with the patient.  Greater than 50% of time was spent in counseling and coordination of care. We have discussed the diagnosis and differential and I answered the patient's questions.    Plan:  Treatment plan and additional workup :  SPLIT night. Fluid restriction after 6 Pm. Reduce beer intake.  Reduce caffeine after lunch.   RV with me.   Larey Seat, MD 08/65/7846, 9:62 AM  Certified in Neurology by ABPN Certified in Sleep Medicine by [facility] [address], [place], [place]  [address]                                             BP 113/72   Pulse 72   Ht 5\' 8"  (1.727 m)   Wt 191 lb (86.6 kg)   BMI 29.04 kg/m    CC: chronic insomnia Subjective:    Patient ID: [name], male    DOB: May 12, 1927, 83 y.o.   MRN: [id number]  HPI: KWAN SHELLHAMMER is a 83 y.o. male presenting on 09/25/2017 for New Patient (Initial Visit) (pt is with partner,  states not sleeping well, wakes up all the time. pt states 2-3 hours of uninterupted sleep. )   Chronic issue with insomnia. See recent phone notes.  Latest trial remeron 15mg  then 7.5mg  for sleep - no better.  Long-term treatment with lunesta with best effect to date, then insurance stopped covering this year.   He does snore but no witnessed apnea.   Has previously tried [employer] and CR (ineffective), sonata, xanax, melatonin, rozerem ineffective, belsomra intolerable, silenor didn't help, trazodone and now mirtazapine (increased anxiety). Has tried benadryl.  We have previously reviewed sleep hygiene.   Bedtime is at 10pm. 30 min to fall asleep. Averages 3 hours of sleep then wakes  up and has trouble falling back asleep. Gets up at 6:30am every morning. No alcohol at bedtime. Avoids anything mentally stimulating at bedtime.   Relevant past medical, surgical, family and social history reviewed and updated as indicated. Interim medical history since our last visit reviewed. Allergies and medications reviewed and updated. Outpatient Medications Prior to Visit  Medication Sig Dispense Refill  . aspirin 81 MG EC tablet TAKE 1 TABLET (81 MG TOTAL) BY MOUTH DAILY. 30 tablet 6  . donepezil (ARICEPT) 5 MG tablet TAKE 1 TABLET (5 MG TOTAL) BY MOUTH AT BEDTIME. 30 tablet 6  . doxazosin (CARDURA) 1 MG tablet TAKE 1 TABLET BY MOUTH EVERY DAY 30 tablet 11  . FIBER, CORN DEXTRIN, PO Take by mouth.    . finasteride (PROSCAR) 5 MG tablet TAKE 1 TABLET BY MOUTH EVERY DAY 90 tablet 1  .  Ivermectin 1 % CREA Apply topically. Apply to face once a day    . metFORMIN (GLUCOPHAGE) 500 MG tablet Take 1 tablet (500 mg total) by mouth 2 (two) times daily with a meal. 180 tablet 1  . metoprolol succinate (TOPROL-XL) 25 MG 24 hr tablet TAKE 1 TABLET BY MOUTH EVERY DAY 90 tablet 0  . Multiple Vitamin (MULTIVITAMIN WITH MINERALS) TABS tablet Take 1 tablet by mouth daily.    . naproxen sodium (ANAPROX) 220 MG tablet Take 220 mg by mouth daily as needed (pain).    . nitroGLYCERIN (NITROSTAT) 0.4 MG SL tablet Place 0.4 mg under the tongue every 5 (five) minutes as needed for chest pain.    Marland Kitchen omeprazole (PRILOSEC) 20 MG capsule TAKE 1 CAPSULE BY MOUTH EVERY DAY 30 capsule 6  . pravastatin (PRAVACHOL) 40 MG tablet TAKE 1 TABLET (40 MG TOTAL) BY MOUTH DAILY. 90 tablet 3  . vitamin B-12 (CYANOCOBALAMIN) 500 MCG tablet Take 500 mcg by mouth daily.    Marland Kitchen zolpidem (AMBIEN CR) 6.25 MG CR tablet Take 1 tablet (6.25 mg total) by mouth at bedtime as needed for sleep. 30 tablet 0   No facility-administered medications prior to visit.     Past Medical History:  Diagnosis Date  . Arthritis of knee   . BPH (benign prostatic hypertrophy)    w/ nocturia  . Coronary artery disease 03/2014   Inferior ST elevation myocardial infarction. Cardiac catheterization showed an occluded mid RCA. He had an angioplasty and drug-eluting stent placement with a 3.0 x 16 mm Promus drug-eluting stent. Ejection fraction was 45% by echo, completed cardiac rehab 06/2014  . Diabetes type 2, controlled ([place])   . History of basal cell cancer    s/p mohs  . History of colon polyps   . History of hypertension   . HLD (hyperlipidemia)    diet controlled in past  . Hypertension   . Insomnia    treated with multiple meds in past  . MI (myocardial infarction) ([place])   . Rosacea   . Skin cancer   . UTI (urinary tract infection)     Past Surgical History:  Procedure Laterality Date  . CARDIAC CATHETERIZATION  03/2014   [facility];x1  stent  . COLONOSCOPY  08/2010   hyperplastic polyp, rec rpt 5 yrs  . ESOPHAGOGASTRODUODENOSCOPY  05/2015   dilated stricture, normal biopsies, HH, no definite infection [name] @ [facility])  . MOHS SURGERY     basal cell chin/back  . REPLACEMENT TOTAL KNEE Left 08/2006  . VASECTOMY  1970    Social History   Tobacco Use  . Smoking status:  Former Smoker    Last attempt to quit: 11/12/1962    Years since quitting: 54.9  . Smokeless tobacco: Never Used  . Tobacco comment: Quit 1964  Substance Use Topics  . Alcohol use: Yes    Comment: 1 beer/day  . Drug use: No    Family History  Problem Relation Age of Onset  . Cancer Father 75       prostate and colon  . Cancer Mother        ovarian or uterine  . CAD Brother 75       MI, smoker  . Parkinson's disease Brother   . COPD Brother   . CAD Brother        MI  . Stroke Sister     Per HPI unless specifically indicated in ROS section below Review of Systems     Objective:    BP 113/72   Pulse 72   Ht 5\' 8"  (1.727 m)   Wt 191 lb (86.6 kg)   BMI 29.04 kg/m   Wt Readings from Last 3 Encounters:  09/25/17 191 lb (86.6 kg)  08/16/17 187 lb 4 oz (84.9 kg)  06/24/17 184 lb 8 oz (83.7 kg)    Physical Exam  Constitutional: He appears well-developed and well-nourished. No distress.  Psychiatric: He has a normal mood and affect.  Nursing note and vitals reviewed.  Results for orders placed or performed in visit on 06/24/17  Hemoglobin A1c  Result Value Ref Range   Hgb A1c MFr Bld 6.8 (H) 4.6 - 6.5 %      Assessment & Plan:   Problem List Items Addressed This Visit    None       Follow up plan: No Follow-up on file.  Larey Seat, MD

## 2017-09-25 NOTE — Telephone Encounter (Signed)
Pt has asked message be sent to RN to inform the [facility] does not have [facility] for eszopiclone (LUNESTA) 2 MG TABS tablet

## 2017-09-25 NOTE — Telephone Encounter (Signed)
Called the patient back to make him aware that I did get a fax confirmation that it went thru at 9:50 this am. I informed the patient that I would resend it again. I confirmed the pharmacy on file and the patient agreed [facility] [address] in [place].  Pt verbalized understanding. I have resent it and received confirmation once more.

## 2017-09-25 NOTE — Addendum Note (Signed)
Addended by: Larey Seat on: 09/25/2017 09:50 AM   Modules accepted: Orders

## 2017-10-19 ENCOUNTER — Other Ambulatory Visit: Payer: Self-pay | Admitting: Family Medicine

## 2017-10-25 ENCOUNTER — Other Ambulatory Visit: Payer: Self-pay | Admitting: Neurology

## 2017-10-25 ENCOUNTER — Encounter: Payer: Self-pay | Admitting: Family Medicine

## 2017-10-29 MED ORDER — ESZOPICLONE 2 MG PO TABS: 2.0000 mg | ORAL_TABLET | Freq: Every evening | ORAL | 0 refills | Status: DC | PRN

## 2017-11-13 ENCOUNTER — Ambulatory Visit (INDEPENDENT_AMBULATORY_CARE_PROVIDER_SITE_OTHER): Payer: Medicare Other | Admitting: Neurology

## 2017-11-13 DIAGNOSIS — Z9861 Coronary angioplasty status: Secondary | ICD-10-CM

## 2017-11-13 DIAGNOSIS — G4731 Primary central sleep apnea: Secondary | ICD-10-CM

## 2017-11-13 DIAGNOSIS — F5104 Psychophysiologic insomnia: Secondary | ICD-10-CM

## 2017-11-13 DIAGNOSIS — E1122 Type 2 diabetes mellitus with diabetic chronic kidney disease: Secondary | ICD-10-CM

## 2017-11-13 DIAGNOSIS — R351 Nocturia: Secondary | ICD-10-CM

## 2017-11-13 DIAGNOSIS — R0683 Snoring: Secondary | ICD-10-CM

## 2017-11-13 DIAGNOSIS — I2510 Atherosclerotic heart disease of native coronary artery without angina pectoris: Secondary | ICD-10-CM

## 2017-11-13 DIAGNOSIS — N181 Chronic kidney disease, stage 1: Principal | ICD-10-CM

## 2017-11-17 ENCOUNTER — Other Ambulatory Visit: Payer: Self-pay | Admitting: Neurology

## 2017-11-18 ENCOUNTER — Other Ambulatory Visit: Payer: Self-pay | Admitting: Neurology

## 2017-11-18 ENCOUNTER — Telehealth: Payer: Self-pay | Admitting: Neurology

## 2017-11-18 DIAGNOSIS — G4731 Primary central sleep apnea: Secondary | ICD-10-CM

## 2017-11-18 DIAGNOSIS — F5101 Primary insomnia: Secondary | ICD-10-CM

## 2017-11-18 DIAGNOSIS — G4761 Periodic limb movement disorder: Secondary | ICD-10-CM

## 2017-11-18 MED ORDER — ESZOPICLONE 2 MG PO TABS: 2.0000 mg | ORAL_TABLET | Freq: Every evening | ORAL | 0 refills | Status: DC | PRN

## 2017-11-18 MED ORDER — ROPINIROLE HCL 0.25 MG PO TABS: 0.2500 mg | ORAL_TABLET | Freq: Every day | ORAL | 3 refills | Status: DC

## 2017-11-18 NOTE — Telephone Encounter (Signed)
I called pt. I advised pt that Dr. Brett Fairy reviewed their sleep study results and found that has complex sleep apnea and recommends that pt be treated with a cpap. Dr. Brett Fairy recommends that pt return for a repeat sleep study in order to properly titrate the cpap and ensure a good mask fit. Pt is agreeable to returning for a titration study. I advised pt that our sleep lab will file with pt's insurance and call pt to schedule the sleep study when we hear back from the pt's insurance regarding coverage of this sleep study. Dr Dohmeier also noted the patient had restlessness in lower extremities and she would like to start the patient on a medication to take at bedtime requip to hopefully help with that. I have informed the patient that [facility] [address] in [place], [place]. Pt verbalized understanding of results. Pt had no questions at this time but was encouraged to call back if questions arise.

## 2017-11-18 NOTE — Telephone Encounter (Signed)
-----   Message from Larey Seat, MD sent at 11/18/2017 10:46 AM EST ----- Complex and severe sleep apnea will require an attended PAP titration. He is at high risk of treatment emergent central apnea- needs to be observed during titration to allow change ot BiPAP or ASV - if needed.  He is also having PLMs and those were contributing to sleep interruptions. I would like to put him on low dose requip 0.25 mg @ night time, and he will return to titration within the next 4 weeks.

## 2017-11-18 NOTE — Procedures (Signed)
PATIENT'S NAME:  Stephen [name], Stephen [name] DOB:      [birth year]      MRN:    [id number]     DATE OF RECORDING: 11/13/2017 REFERRING M.D.:  Ria Bush ,M.D. Study Performed:   Baseline Polysomnogram HISTORY:  Stephen [name] is a 84 year old male Patient, seen here in a referral from Dr. Danise [name] for chronic Insomnia, a condition he has suffered with for at least 20 years. He reports excessive sleepiness in daytime, struggled to stay awake. He further has CAD, had a MI in 2015, borderline DM, HTN, arthritis and nocturia. He has tried a variety of sleep aids.   The patient endorsed the Epworth Sleepiness Scale at 13/24 , FSS at 33 points.  Geriatric Depression score was 2/15. The patient's weight 191 pounds with a height of 68 (inches), resulting in a BMI of 29.1 kg/m2. The patient's neck circumference measured 18 inches.  CURRENT MEDICATIONS: Aspirin, Aricept, Cardura, fiber, Ivermectin( ?), Metformin, Toprol,  Multivitamin, Anaprox, Nitrostat, Prilosec, Pravachol, Vitamin B12, Ambien CR   PROCEDURE:  This is a multichannel digital polysomnogram utilizing the Somnostar 11.2 system.  Electrodes and sensors were applied and monitored per AASM Specifications.   EEG, EOG, Chin and Limb EMG, were sampled at 200 Hz.  ECG, Snore and Nasal Pressure, Thermal Airflow, Respiratory Effort, CPAP Flow and Pressure, Oximetry was sampled at 50 Hz. Digital video and audio were recorded.      BASELINE STUDY: Lights Out was at 20:57 and Lights On at 05:00.  Total recording time (TRT) was 483 minutes, with a total sleep time (TST) of 312.5 minutes.  The patient's sleep latency was extremely prolonged at 129 minutes.  REM latency was 227 minutes. The sleep efficiency was 64.7 %. WASO (Wake after sleep onset) was 140.5 minutes. There were 26.5 minutes in Stage N1, 244 minutes Stage N2, 0 minutes Stage N3 and 42 minutes in Stage REM. The percentage of Stage N1 was 8.5%, Stage N2 was 78.1%, Stage N3 was 0% and Stage R (REM  sleep) was 13.4%.   RESPIRATORY ANALYSIS:  There were a total of 227 respiratory events:  62 obstructive apneas, 23 central apneas and 43 mixed apneas with a total of 128 apneas and an apnea index (AI) of 24.6 /hour. There were 99 hypopneas with a hypopnea index of 19.8 /hour. The patient also had 0 respiratory event related arousals (RERAs).    The total APNEA/HYPOPNEA INDEX (AHI) was 43.6/hour and the total RESPIRATORY DISTURBANCE INDEX was 43.6 /hour.  28 events occurred in REM sleep and 215 events in NREM. The REM AHI was 40 /hour, versus a non-REM AHI of 44.1. The patient spent 23 minutes of total sleep time in the supine position and 290 minutes in non-supine. The supine AHI was 54.8 versus a non-supine AHI of 42.7.  OXYGEN SATURATION & C02:  The Wake baseline 02 saturation was 96%, with the lowest being 87%. Time spent below 89% saturation equaled 5 minutes.   PERIODIC LIMB MOVEMENTS:  The patient had a total of 40 Periodic Limb Movements.  The Periodic Limb Movement (PLM) index was 7.7 and the PLM Arousal index was 2.5/hour.  The arousals were noted as: 34 were spontaneous, 13 were associated with PLMs, and 156 were associated with respiratory events. Audio and video analysis did not show any abnormal or unusual movements, behaviors, phonations or vocalizations.  The patient took one bathroom break only. Loud Snoring was noted, worse in supine. EKG was in keeping with normal sinus rhythm (  NSR) and isolated PVCs.  IMPRESSION: 1) Complex Sleep Apnea (CSA) 66 central and mixed apneas and 62 obstructive apneas.  2) Moderate Severity of Periodic Limb Movement Disorder (PLMD) 3) Repetitive Intrusions of Sleep, sleep fragmentation  RECOMMENDATIONS:  1. Advise full-night, attended, CPAP titration study to optimize therapy. This patient's complex apnea type makes an attended sleep study titration necessary- He is at risk for central apneas worsening under CPAP, may require BiPAP or  ASV. 2. Positional therapy is advised to reduce snoring. 3. Avoid sedative-hypnotics, alcohol and muscle relaxants which may worsen sleep apnea (as applicable). 4. Advise to continue with a moderate diet and exercise. 5. Advise patient to avoid driving or operating hazardous machinery when sleepy. 6. Further information regarding OSA may be obtained from [employer] (www.sleepfoundation.org) or American Sleep Apnea Association (www.sleepapnea.org).     7. There were many periodic limb movements of sleep (PLMS) with associated sleep disruption.  Consider treating the PLMS primarily as these contribute to arousals. Pharmacotherapy may be warranted.  Obtain a serum ferritin level if the clinical history is consistent with RLS.  Consider iron therapy and evaluation for iron deficiency anemia if the serum ferritin level < 50 ng/mL.  Certain medications or substances may aggravate RLS and common offenders may include the following:  nicotine, caffeine, SSRIs, TCAs, phenothiazine, dopamine antagonists, diphenhydramine, and alcohol.      8. Consider dedicated sleep psychology referral if insomnia does not improve after addressing the organic sleep disorders as identified in this study.   9. A follow up appointment will be scheduled in the Sleep Clinic at [facility] with me, Dr. Asencion Partridge Wandalene Abrams. The referring provider will be notified of the results.     I certify that I have reviewed the entire raw data recording prior to the issuance of this report in accordance with the Standards of Accreditation of the American Academy of Sleep Medicine (AASM)  Larey Seat, MD      11-18-2017 Diplomat, American Board of Psychiatry and Neurology  Diplomat, American Board of [place] Director, [facility] at [employer]

## 2017-11-21 ENCOUNTER — Ambulatory Visit (INDEPENDENT_AMBULATORY_CARE_PROVIDER_SITE_OTHER): Payer: Medicare Other | Admitting: Neurology

## 2017-11-21 DIAGNOSIS — G4761 Periodic limb movement disorder: Secondary | ICD-10-CM

## 2017-11-21 DIAGNOSIS — G4731 Primary central sleep apnea: Secondary | ICD-10-CM | POA: Diagnosis not present

## 2017-11-21 DIAGNOSIS — F5101 Primary insomnia: Secondary | ICD-10-CM

## 2017-11-21 NOTE — Telephone Encounter (Signed)
Pt friend [name] called to inform us that pt has taken rOPINIRole (REQUIP) 0.25 MG tablet for two nights and has terrible diarrhea. Pt is wanting to know if there is anything they can do

## 2017-11-23 ENCOUNTER — Encounter: Payer: Self-pay | Admitting: Family Medicine

## 2017-11-23 DIAGNOSIS — G4731 Primary central sleep apnea: Secondary | ICD-10-CM | POA: Insufficient documentation

## 2017-11-23 DIAGNOSIS — G4761 Periodic limb movement disorder: Secondary | ICD-10-CM | POA: Insufficient documentation

## 2017-12-01 ENCOUNTER — Other Ambulatory Visit: Payer: Self-pay | Admitting: Neurology

## 2017-12-01 DIAGNOSIS — G4731 Primary central sleep apnea: Secondary | ICD-10-CM

## 2017-12-01 NOTE — Procedures (Signed)
PATIENT'S NAME:  Stephen [name], Stephen [name] DOB:      July 29, 1927      MRN:    [id number]     DATE OF RECORDING: 11/21/2017 REFERRING M.D.:  Ria Bush, M.D. Study Performed:   Titration to PAP (Positive Airway Pressure), different modalities applied HISTORY:  Stephen [name] is an 84 year old male Patient, seen for chronic Insomnia, a condition he has suffered with for at least 20 years. He reports snoring and excessive sleepiness in daytime, struggled to stay awake. He further has CAD with MI in 2015, borderline DM, HTN, arthritis and nocturia. He has tried a variety of sleep aids. His baseline PSG from 11/13/2017 at [facility] sleep revealed: 1) Complex Sleep Apnea (CSA) 66 central and mixed apneas and 62 obstructive apneas.  2) Moderate Severity of Periodic Limb Movement Disorder (PLMD) The patient endorsed the Epworth Sleepiness Scale at 13 points.  The patient's weight 192 pounds with a height of 68 (inches), resulting in a BMI of 29.1 kg/m2.The patient's neck circumference measured 18 inches.  CURRENT MEDICATIONS: Aspirin, Aricept, Cardura, Metformin, Toprol, Multivitamin, Anaprox, Nitrostat, Prilosec, Pravachol, Vitamin B12, Ambien CR  PROCEDURE:  This is a multichannel digital polysomnogram utilizing the SomnoStar 11.2 system.  Electrodes and sensors were applied and monitored per AASM Specifications.   EEG, EOG, Chin and Limb EMG, were sampled at 200 Hz.  ECG, Snore and Nasal Pressure, Thermal Airflow, Respiratory Effort, CPAP Flow and Pressure, Oximetry was sampled at 50 Hz. Digital video and audio were recorded.      CPAP was initiated at 5 cmH20 with heated humidity per AASM split night standards and pressure was advanced to 7 cmH20 because of hypopneas, apneas and desaturations. CPAP did further increase the AHI and a switch to BiPAP was made. Under BiPAP pressure of 10/5 cm water and step wise increase to 14/9 cm there was still no decrease in AHI, and adding a respiratory rate of 10/min did  not improve this ( BiPAP ST 10).  The patient was now switched to ASV under which he did well- the 15/5/4 cm setting did reduce the AHI to 0.5/hr.   Lights Out was at 00:01 and Lights On at 06:45. Total recording time (TRT) was 404 minutes, with a total sleep time (TST) of 289.5 minutes. The patient's sleep latency was 28 minutes with 0.5 minutes of wake time after sleep onset. REM latency was 133.5 minutes.  The sleep efficiency was 71.7 %.    SLEEP ARCHITECTURE: WASO (Wake after sleep onset) was 86 minutes.  There were 19 minutes in Stage N1, 198 minutes Stage N2, 31 minutes Stage N3 and 41.5 minutes in Stage REM.  The percentage of Stage N1 was 6.6%, Stage N2 was 68.4%, Stage N3 was 10.7% and Stage R (REM sleep) was 14.3%. The sleep architecture was notable for REM rebound.  RESPIRATORY ANALYSIS:  There was a total of 130 respiratory events: 19 obstructive apneas, 47 central apneas and 11 mixed apneas with a total of 77 apneas and an apnea index (AI) of 16.0 /hour. There were 53 hypopneas with a hypopnea index of 11.0/hour. The patient also had 0 respiratory event related arousals (RERAs).    The total APNEA/HYPOPNEA INDEX (AHI) was 26.9 /hour and the total RESPIRATORY DISTURBANCE INDEX was 26.9/ hour.  4 events occurred in REM sleep and 126 events in NREM. The REM AHI was 5.8 /hour, versus a non-REM AHI of 30.5 /hour.  The patient spent 252 minutes of total sleep time in the  supine position and 38 minutes in non-supine. The supine AHI was 30.9/hr., versus a non-supine AHI of 0.0. OXYGEN SATURATION & C02:  The baseline 02 saturation was 95%, with the lowest being 89%. Time spent below 89% saturation equaled 0 minutes.  PERIODIC LIMB MOVEMENTS: The patient had a total of 84 Periodic Limb Movements. The Periodic Limb Movement (PLM) index was 17.4 and the PLM Arousal index was 0.4 /hour. The arousals were noted as: 40 were spontaneous, 2 were associated with PLMs, and 25 were associated with respiratory  events.  Audio and video analysis did not show any abnormal or unusual movements, behaviors, phonations or vocalizations.  One nocturia. Snoring during titration was not noted. EKG was in keeping with normal sinus rhythm (NSR). Post-study, the patient indicated that sleep was much worse than usual. He had indicated that he is apprehensive about CPAP and would much rather use a dental device, if applicable.  DIAGNOSIS 1. Complex Sleep Apnea with treatment emergent central apneas did worsen under CPAP. BiPAP and BiPAP ST were ineffective and only ASV reduced the Apnea and hypopnea count drastically.  The patient tolerated ASV at a maximum pressure support of 15, minimum pressure support of 5 cm water, and EEP of 4 cm. The patient was fitted with a FFM, a mediums sized SIMPLUS apparatus.  2. Insomnia persisted under PAP titration, but the patient reached a sleep efficiency of 87% under the first setting of ASV with pressures of 15/5/0 cm water. 3. Mild PLM disorder.  1. PLANS/RECOMMENDATIONS: Auto-SV:  Auto-titrating synchronized ventilation (auto-SV, ASV) I will order the above named machine and setting, but let the patient decide about the interface. a. ASV- PAP clinic follow up in 2-3 months after receiving device; and thereafter, yearly sleep clinic follow-up is advisable. b. Avoidance of medications with muscle relaxant properties and alcohol prior to sleeping. c. Avoiding sleeping in the supine position (on one's back). d. Improvement of nasal patency if indicated.  A follow up appointment will be scheduled in the Sleep Clinic at [facility].   Please call [phone] with any questions.     I certify that I have reviewed the entire raw data recording prior to the issuance of this report in accordance with the Standards of Accreditation of the American Academy of Sleep Medicine (AASM)   Larey Seat, M.D.   11-29-2016  Diplomat, American Board of Psychiatry and  Neurology  Diplomat, [place] of Sleep Medicine Medical Director of [facility] at [facility]

## 2017-12-02 ENCOUNTER — Encounter: Payer: Self-pay | Admitting: Neurology

## 2017-12-02 ENCOUNTER — Telehealth: Payer: Self-pay | Admitting: Neurology

## 2017-12-02 NOTE — Telephone Encounter (Signed)
I called the patient and went over the sleep study results. Pt was concerned that she never got a call back from her call in jan. I informed her that I was never given the message and apologized for the inconvenience. I informed Dr Brett Fairy of her concern in regards to the medication and Dr Dohmeier asked if the patient would be willing to try once more since this isn't a typical side effect for this medication, the patient was aggreable to trying this after he clears up from this cold he has. We have instructed when he takes it to take it at lunch time. Pt verbalized understanding.

## 2017-12-02 NOTE — Telephone Encounter (Signed)
I called pt. I advised pt that Dr. Brett Fairy reviewed their sleep study results and found that pt has complex sleep apnea. Dr. Brett Fairy recommends that pt starts ASV. I reviewed PAP compliance expectations with the pt. Pt is agreeable to starting an ASV. I advised pt that an order will be sent to a DME, [facility], and [facility] will call the pt within about one week after they file with the pt's insurance. [facility] will show the pt how to use the machine, fit for masks, and troubleshoot the ASV if needed. A follow up appt was made for insurance purposes with Lowella Dandy  on April 19,2019 at 10:00 am. Pt verbalized understanding to arrive 15 minutes early and bring their ASV. A letter with all of this information in it will be mailed to the pt as a reminder. I verified with the pt that the address we have on file is correct. Pt verbalized understanding of results. Pt had no questions at this time but was encouraged to call back if questions arise.

## 2017-12-02 NOTE — Telephone Encounter (Signed)
-----   Message from Larey Seat, MD sent at 12/01/2017  1:01 PM EST ----- This study documented that CPAP and BiPAP were not beneficial for the complex apnea pattern and only ASV got the apnea under control.  ASV pressure of 15/5/4 cm water  Will be ordered. A FFM ( full face mask )was chosen by the tech Rodman Key R.) . I placed order in Epic.

## 2017-12-19 ENCOUNTER — Other Ambulatory Visit: Payer: Self-pay | Admitting: Family Medicine

## 2017-12-19 ENCOUNTER — Telehealth: Payer: Self-pay | Admitting: Neurology

## 2017-12-19 ENCOUNTER — Other Ambulatory Visit: Payer: Self-pay | Admitting: Neurology

## 2017-12-19 DIAGNOSIS — N181 Chronic kidney disease, stage 1: Principal | ICD-10-CM

## 2017-12-19 DIAGNOSIS — E1122 Type 2 diabetes mellitus with diabetic chronic kidney disease: Secondary | ICD-10-CM

## 2017-12-19 DIAGNOSIS — E7800 Pure hypercholesterolemia, unspecified: Secondary | ICD-10-CM

## 2017-12-19 DIAGNOSIS — D649 Anemia, unspecified: Secondary | ICD-10-CM

## 2017-12-19 NOTE — Telephone Encounter (Signed)
Pt's wife called he will be out of eszopiclone (LUNESTA) 2 MG TABS tablet as of tomorrow. Please send to [facility]

## 2017-12-20 ENCOUNTER — Telehealth: Payer: Self-pay | Admitting: Neurology

## 2017-12-20 ENCOUNTER — Other Ambulatory Visit: Payer: Self-pay | Admitting: Neurology

## 2017-12-20 ENCOUNTER — Ambulatory Visit (INDEPENDENT_AMBULATORY_CARE_PROVIDER_SITE_OTHER): Payer: Medicare Other

## 2017-12-20 VITALS — BP 102/68 | HR 64 | Temp 97.7°F | Ht 66.5 in | Wt 189.0 lb

## 2017-12-20 DIAGNOSIS — E7800 Pure hypercholesterolemia, unspecified: Secondary | ICD-10-CM

## 2017-12-20 DIAGNOSIS — Z23 Encounter for immunization: Secondary | ICD-10-CM

## 2017-12-20 DIAGNOSIS — E1122 Type 2 diabetes mellitus with diabetic chronic kidney disease: Secondary | ICD-10-CM

## 2017-12-20 DIAGNOSIS — Z0000 Encounter for general adult medical examination without abnormal findings: Secondary | ICD-10-CM

## 2017-12-20 DIAGNOSIS — N181 Chronic kidney disease, stage 1: Secondary | ICD-10-CM

## 2017-12-20 LAB — LIPID PANEL
Cholesterol: 131 mg/dL (ref 0–200)
HDL: 51.1 mg/dL (ref >39.00)
LDL CALC: 53 mg/dL (ref 0–99)
NONHDL: 80.18
Total CHOL/HDL Ratio: 3
Triglycerides: 134 mg/dL (ref 0.0–149.0)
VLDL: 26.8 mg/dL (ref 0.0–40.0)

## 2017-12-20 LAB — COMPREHENSIVE METABOLIC PANEL
ALT: 13 U/L (ref 0–53)
AST: 20 U/L (ref 0–37)
Albumin: 3.9 g/dL (ref 3.5–5.2)
Alkaline Phosphatase: 50 U/L (ref 39–117)
BILIRUBIN TOTAL: 0.7 mg/dL (ref 0.2–1.2)
BUN: 17 mg/dL (ref 6–23)
CHLORIDE: 100 meq/L (ref 96–112)
CO2: 31 mEq/L (ref 19–32)
CREATININE: 1.15 mg/dL (ref 0.40–1.50)
Calcium: 9.4 mg/dL (ref 8.4–10.5)
GFR: 63.75 mL/min (ref >60.00)
GLUCOSE: 123 mg/dL — AB (ref 70–99)
Potassium: 4.6 mEq/L (ref 3.5–5.1)
SODIUM: 136 meq/L (ref 135–145)
Total Protein: 7.3 g/dL (ref 6.0–8.3)

## 2017-12-20 LAB — HEMOGLOBIN A1C: HEMOGLOBIN A1C: 6.9 % — AB (ref 4.6–6.5)

## 2017-12-20 LAB — MICROALBUMIN / CREATININE URINE RATIO
Creatinine,U: 73.2 mg/dL
MICROALB/CREAT RATIO: 1.9 mg/g (ref 0.0–30.0)
Microalb, Ur: 1.4 mg/dL (ref 0.0–1.9)

## 2017-12-20 MED ORDER — ESZOPICLONE 2 MG PO TABS: 2.0000 mg | ORAL_TABLET | Freq: Every evening | ORAL | 5 refills | Status: DC | PRN

## 2017-12-20 NOTE — Progress Notes (Signed)
Subjective:   Stephen [name] is a 84 y.o. male who presents for Medicare Annual/Subsequent preventive examination.  Review of Systems:  N/A Cardiac Risk Factors include: advanced age (>77men, >53 women);male gender;diabetes mellitus;dyslipidemia;obesity (BMI >30kg/m2)     Objective:    Vitals: BP 102/68 (BP Location: Right Arm, Patient Position: Sitting, Cuff Size: Normal)   Pulse 64   Temp 97.7 F (36.5 C) (Oral)   Ht 5' 6.5" (1.689 m) Comment: no shoes  Wt 189 lb (85.7 kg)   SpO2 97%   BMI 30.05 kg/m   Body mass index is 30.05 kg/m.  Advanced Directives 12/20/2017  Does Patient Have a Medical Advance Directive? Yes  Type of Paramedic of [place];Living will  Copy of [place] in Chart? No - copy requested    Tobacco Social History   Tobacco Use  Smoking Status Former Smoker  . Last attempt to quit: 11/12/1962  . Years since quitting: 55.1  Smokeless Tobacco Never Used  Tobacco Comment   Quit 1964     Counseling given: No Comment: Quit 1964   Clinical Intake:  Pre-visit preparation completed: Yes  Pain : No/denies pain Pain Score: 0-No pain     Nutritional Status: BMI > 30  Obese Nutritional Risks: None Diabetes: Yes CBG done?: No Did pt. bring in CBG monitor from home?: No  How often do you need to have someone help you when you read instructions, pamphlets, or other written materials from your doctor or pharmacy?: 1 - Never What is the last grade level you completed in school?: 12th grade + 2 yrs college  Interpreter Needed?: No  Comments: pt lives with significant other Information entered by :: LPinson, LPN  Past Medical History:  Diagnosis Date  . Arthritis of knee   . BPH (benign prostatic hypertrophy)    w/ nocturia  . Coronary artery disease 03/2014   Inferior ST elevation myocardial infarction. Cardiac catheterization showed an occluded mid RCA. He had an angioplasty and drug-eluting stent  placement with a 3.0 x 16 mm Promus drug-eluting stent. Ejection fraction was 45% by echo, completed cardiac rehab 06/2014  . Diabetes type 2, controlled ([place])   . History of basal cell cancer    s/p mohs  . History of colon polyps   . History of hypertension   . HLD (hyperlipidemia)    diet controlled in past  . Hypertension   . Insomnia    treated with multiple meds in past  . MI (myocardial infarction) ([place])   . Rosacea   . Skin cancer   . UTI (urinary tract infection)    Past Surgical History:  Procedure Laterality Date  . CARDIAC CATHETERIZATION  03/2014   [facility];x1 stent  . CATARACT EXTRACTION W/PHACO Left 10/09/2016   Procedure: CATARACT EXTRACTION PHACO AND INTRAOCULAR LENS PLACEMENT (IOC);  Surgeon: Birder Robson, MD;  Location: [facility] ORS;  Service: Ophthalmology;  Laterality: Left;  [place] 1.13 AP% 18.3 CDE 13.45 Fluid pack lot # [id number]  . COLONOSCOPY  08/2010   hyperplastic polyp, rec rpt 5 yrs  . ESOPHAGOGASTRODUODENOSCOPY  05/2015   dilated stricture, normal biopsies, HH, no definite infection [name] @ [facility])  . MOHS SURGERY     basal cell chin/back  . REPLACEMENT TOTAL KNEE Left 08/2006  . VASECTOMY  1970   Family History  Problem Relation Age of Onset  . Cancer Father 33       prostate and colon  . Cancer Mother  ovarian or uterine  . CAD Brother 35       MI, smoker  . Parkinson's disease Brother   . COPD Brother   . CAD Brother        MI  . Stroke Sister    Social History   Socioeconomic History  . Marital status: Widowed    Spouse name: None  . Number of children: None  . Years of education: None  . Highest education level: None  Social Needs  . Financial resource strain: None  . Food insecurity - worry: None  . Food insecurity - inability: None  . Transportation needs - medical: None  . Transportation needs - non-medical: None  Occupational History  . None  Tobacco Use  . Smoking status: Former Smoker    Last attempt to quit: 11/12/1962     Years since quitting: 55.1  . Smokeless tobacco: Never Used  . Tobacco comment: Quit 1964  Substance and Sexual Activity  . Alcohol use: Yes    Alcohol/week: 0.6 oz    Types: 1 Cans of beer per week  . Drug use: No  . Sexual activity: No  Other Topics Concern  . None  Social History Narrative   Lives [facility] with friend - Osvaldo Angst RN.   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 Vegetarian.    Outpatient Encounter Medications as of 12/20/2017  Medication Sig  . aspirin 81 MG EC tablet TAKE 1 TABLET (81 MG TOTAL) BY MOUTH DAILY.  Marland Kitchen donepezil (ARICEPT) 5 MG tablet TAKE 1 TABLET (5 MG TOTAL) BY MOUTH AT BEDTIME.  Marland Kitchen doxazosin (CARDURA) 1 MG tablet TAKE 1 TABLET BY MOUTH EVERY DAY  . FIBER, CORN DEXTRIN, PO Take by mouth.  . finasteride (PROSCAR) 5 MG tablet TAKE 1 TABLET BY MOUTH EVERY DAY  . Ivermectin 1 % CREA Apply topically. Apply to face once a day  . metFORMIN (GLUCOPHAGE) 500 MG tablet TAKE 1 TABLET (500 MG TOTAL) BY MOUTH 2 (TWO) TIMES DAILY WITH A MEAL.  . metoprolol succinate (TOPROL-XL) 25 MG 24 hr tablet TAKE 1 TABLET BY MOUTH EVERY DAY  . Multiple Vitamin (MULTIVITAMIN WITH MINERALS) TABS tablet Take 1 tablet by mouth daily.  . naproxen sodium (ANAPROX) 220 MG tablet Take 220 mg by mouth daily as needed (pain).  . nitroGLYCERIN (NITROSTAT) 0.4 MG SL tablet Place 0.4 mg under the tongue every 5 (five) minutes as needed for chest pain.  Marland Kitchen omeprazole (PRILOSEC) 20 MG capsule TAKE 1 CAPSULE BY MOUTH EVERY DAY  . pravastatin (PRAVACHOL) 40 MG tablet TAKE 1 TABLET (40 MG TOTAL) BY MOUTH DAILY.  . vitamin B-12 (CYANOCOBALAMIN) 500 MCG tablet Take 500 mcg by mouth daily.  . [DISCONTINUED] eszopiclone (LUNESTA) 2 MG TABS tablet Take 1 tablet (2 mg total) by mouth at bedtime as needed for sleep. Take  immediately before bedtime  . [DISCONTINUED] rOPINIRole (REQUIP) 0.25 MG tablet Take 1 tablet (0.25 mg total) by mouth at bedtime. (Patient not taking: Reported on 12/20/2017)  . [DISCONTINUED] zolpidem (AMBIEN CR) 6.25 MG CR tablet Take 1 tablet (6.25 mg total) by mouth at bedtime as needed for sleep. (Patient not taking: Reported on 12/20/2017)   No facility-administered encounter medications on file as of 12/20/2017.     Activities of Daily Living In your present state of health,  do you have any difficulty performing the following activities: 12/20/2017  Hearing? Y  Comment wears hearing aids  Vision? N  Difficulty concentrating or making decisions? Y  Walking or climbing stairs? N  Dressing or bathing? N  Doing errands, shopping? N  Preparing Food and eating ? N  Using the Toilet? N  In the past six months, have you accidently leaked urine? N  Do you have problems with loss of bowel control? N  Managing your Medications? N  Managing your Finances? N  Housekeeping or managing your Housekeeping? N  Some recent data might be hidden    Patient Care Team: Ria Bush, MD as PCP - General (Family Medicine) Wellington Hampshire, MD as Consulting Physician (Cardiology)   Assessment:   This is a routine wellness examination for Gill.   Visual Acuity Screening   Right eye Left eye Both eyes  Without correction:     With correction: 20/30 20/30-1 20/30  Hearing Screening Comments: Hearing aids   Exercise Activities and Dietary recommendations Current Exercise Habits: Structured exercise class, Type of exercise: Other - see comments(golf 9 holes once weekly), Time (Minutes): 60, Frequency (Times/Week): 1, Weekly Exercise (Minutes/Week): 60, Intensity: Moderate, Exercise limited by: None identified  Goals    . Increase physical activity     Starting 12/20/2017, I will continue to do exercise class for 60 minutes once weekly and to play golf once weekly as weather permits.         Fall Risk Fall Risk  12/20/2017 12/17/2016 12/12/2015 11/29/2014 11/27/2013  Falls in the past year? No No No No No    Depression Screen PHQ 2/9 Scores 12/20/2017 12/17/2016 12/12/2015 11/29/2014  PHQ - 2 Score 0 0 0 0  PHQ- 9 Score 0 - - -    Cognitive Function MMSE - Mini Mental State Exam 12/20/2017  Orientation to time 5  Orientation to Place 5  Registration 3  Attention/ Calculation 0  Recall 1  Recall-comments unable to recall 2 of 3 words  Language- name 2 objects 0  Language- repeat 1  Language- follow 3 step command 3  Language- read & follow direction 0  Write a sentence 0  Copy design 0  Total score 18     PLEASE NOTE: A Mini-Cog screen was completed. Maximum score is 20. A value of 0 denotes this part of [place]stein MMSE was not completed or the patient failed this part of the Mini-Cog screening.   Mini-Cog Screening Orientation to Time - Max 5 pts Orientation to Place - Max 5 pts Registration - Max 3 pts Recall - Max 3 pts Language Repeat - Max 1 pts Language Follow 3 Step Command - Max 3 pts   Immunization History  Administered Date(s) Administered  . Influenza Whole 09/05/2013  . Influenza, High Dose Seasonal PF 07/27/2016  . Influenza,inj,Quad PF,6+ Mos 09/12/2015, 08/01/2017  . Influenza-Unspecified 08/12/2014  . Pneumococcal Conjugate-13 09/29/2011, 11/29/2014  . Pneumococcal Polysaccharide-23 12/20/2017  . Td 05/12/2008  . Zoster 05/12/2008    Screening Tests Health Maintenance  Topic Date Due  . DTaP/Tdap/Td (1 - Tdap) 05/12/2018 (Originally 05/13/2008)  . OPHTHALMOLOGY EXAM  08/11/2018 (Originally 07/23/2017)  . TETANUS/TDAP  05/12/2018  . HEMOGLOBIN A1C  06/19/2018  . FOOT EXAM  06/24/2018  . URINE MICROALBUMIN  12/20/2018  . INFLUENZA VACCINE  Completed  . PNA vac Low Risk Adult  Completed     Plan:   I have personally reviewed, addressed, and noted the following in the patient's  chart:  A. Medical and social history B. Use of alcohol,  tobacco or illicit drugs  C. Current medications and supplements D. Functional ability and status E.  Nutritional status F.  Physical activity G. Advance directives H. List of other physicians I.  Hospitalizations, surgeries, and ER visits in previous 12 months J.  Reading to include hearing, vision, cognitive, depression L. Referrals and appointments - none  In addition, I have reviewed and discussed with patient certain preventive protocols, quality metrics, and best practice recommendations. A written personalized care plan for preventive services as well as general preventive health recommendations were provided to patient.  See attached scanned questionnaire for additional information.   Signed,   Lindell Noe, MHA, BS, LPN Health Coach

## 2017-12-20 NOTE — Progress Notes (Signed)
PCP notes:   Health maintenance:  Eye exam - addressed A1C - completed Microalbumin - completed PPSV23 - administered  Abnormal screenings:   Mini-Cog score: 18/20  Patient concerns:   None  Nurse concerns:  None  Next PCP appt:   12/26/17 @ 0830

## 2017-12-20 NOTE — Patient Instructions (Addendum)
Mr. Zou , Thank you for taking time to come for your Medicare Wellness Visit. I appreciate your ongoing commitment to your health goals. Please review the following plan we discussed and let me know if I can assist you in the future.   These are the goals we discussed: Goals    . Increase physical activity     Starting 12/20/2017, I will continue to do exercise class for 60 minutes once weekly and to play golf once weekly as weather permits.        This is a list of the screening recommended for you and due dates:  Health Maintenance  Topic Date Due  . DTaP/Tdap/Td vaccine (1 - Tdap) 05/12/2018*  . Eye exam for diabetics  08/11/2018*  . Tetanus Vaccine  05/12/2018  . Hemoglobin A1C  06/19/2018  . Complete foot exam   06/24/2018  . Urine Protein Check  12/20/2018  . Flu Shot  Completed  . Pneumonia vaccines  Completed  *Topic was postponed. The date shown is not the original due date.   Preventive Care for Adults  A healthy lifestyle and preventive care can promote health and wellness. Preventive health guidelines for adults include the following key practices.  . A routine yearly physical is a good way to check with your health care provider about your health and preventive screening. It is a chance to share any concerns and updates on your health and to receive a thorough exam.  . Visit your dentist for a routine exam and preventive care every 6 months. Brush your teeth twice a day and floss once a day. Good oral hygiene prevents tooth decay and gum disease.  . The frequency of eye exams is based on your age, health, family medical history, use  of contact lenses, and other factors. Follow your health care provider's recommendations for frequency of eye exams.  . Eat a healthy diet. Foods like vegetables, fruits, whole grains, low-fat dairy products, and lean protein foods contain the nutrients you need without too many calories. Decrease your intake of foods high in solid  fats, added sugars, and salt. Eat the right amount of calories for you. Get information about a proper diet from your health care provider, if necessary.  . Regular physical exercise is one of the most important things you can do for your health. Most adults should get at least 150 minutes of moderate-intensity exercise (any activity that increases your heart rate and causes you to sweat) each week. In addition, most adults need muscle-strengthening exercises on 2 or more days a week.  Silver Sneakers may be a benefit available to you. To determine eligibility, you may visit the website: www.silversneakers.com or contact program at (269) 064-3362 Mon-Fri between 8AM-8PM.   . Maintain a healthy weight. The body mass index (BMI) is a screening tool to identify possible weight problems. It provides an estimate of body fat based on height and weight. Your health care provider can find your BMI and can help you achieve or maintain a healthy weight.   For adults 20 years and older: ? A BMI below 18.5 is considered underweight. ? A BMI of 18.5 to 24.9 is normal. ? A BMI of 25 to 29.9 is considered overweight. ? A BMI of 30 and above is considered obese.   . Maintain normal blood lipids and cholesterol levels by exercising and minimizing your intake of saturated fat. Eat a balanced diet with plenty of fruit and vegetables. Blood tests for lipids and cholesterol should  begin at age 29 and be repeated every 5 years. If your lipid or cholesterol levels are high, you are over 50, or you are at high risk for heart disease, you may need your cholesterol levels checked more frequently. Ongoing high lipid and cholesterol levels should be treated with medicines if diet and exercise are not working.  . If you smoke, find out from your health care provider how to quit. If you do not use tobacco, please do not start.  . If you choose to drink alcohol, please do not consume more than 2 drinks per day. One drink is  considered to be 12 ounces (355 mL) of beer, 5 ounces (148 mL) of wine, or 1.5 ounces (44 mL) of liquor.  . If you are 16-11 years old, ask your health care provider if you should take aspirin to prevent strokes.  . Use sunscreen. Apply sunscreen liberally and repeatedly throughout the day. You should seek shade when your shadow is shorter than you. Protect yourself by wearing long sleeves, pants, a wide-brimmed hat, and sunglasses year round, whenever you are outdoors.  . Once a month, do a whole body skin exam, using a mirror to look at the skin on your back. Tell your health care provider of new moles, moles that have irregular borders, moles that are larger than a pencil eraser, or moles that have changed in shape or color.

## 2017-12-20 NOTE — Telephone Encounter (Signed)
Called the patient and made him aware that Dr Jaynee Eagles had an emergency and had to leave. Pt was very understanding and we will get him rescheduled

## 2017-12-20 NOTE — Progress Notes (Signed)
Pre visit review using our clinic review tool, if applicable. No additional management support is needed unless otherwise documented below in the visit note. 

## 2017-12-20 NOTE — Telephone Encounter (Signed)
Called to clarify and make sure a refill was needed. [facility] from [facility] states it was filled on the 8th. He has one dose for tonight and then one more for tomorrow. I have also called the pharmacy to verify this.

## 2017-12-22 NOTE — Progress Notes (Signed)
I reviewed health advisor's note, was available for consultation, and agree with documentation and plan.  

## 2017-12-23 DIAGNOSIS — H903 Sensorineural hearing loss, bilateral: Secondary | ICD-10-CM | POA: Diagnosis not present

## 2017-12-23 DIAGNOSIS — H6123 Impacted cerumen, bilateral: Secondary | ICD-10-CM | POA: Diagnosis not present

## 2017-12-23 NOTE — Telephone Encounter (Signed)
Refill was sent on Friday 2/8

## 2017-12-26 ENCOUNTER — Encounter: Payer: Self-pay | Admitting: Family Medicine

## 2017-12-26 ENCOUNTER — Ambulatory Visit (INDEPENDENT_AMBULATORY_CARE_PROVIDER_SITE_OTHER): Payer: Medicare Other | Admitting: Family Medicine

## 2017-12-26 VITALS — BP 118/64 | HR 70 | Temp 98.0°F | Ht 67.0 in | Wt 187.2 lb

## 2017-12-26 DIAGNOSIS — G4761 Periodic limb movement disorder: Secondary | ICD-10-CM | POA: Diagnosis not present

## 2017-12-26 DIAGNOSIS — N181 Chronic kidney disease, stage 1: Secondary | ICD-10-CM

## 2017-12-26 DIAGNOSIS — E7800 Pure hypercholesterolemia, unspecified: Secondary | ICD-10-CM

## 2017-12-26 DIAGNOSIS — R011 Cardiac murmur, unspecified: Secondary | ICD-10-CM

## 2017-12-26 DIAGNOSIS — J8410 Pulmonary fibrosis, unspecified: Secondary | ICD-10-CM | POA: Diagnosis not present

## 2017-12-26 DIAGNOSIS — E1122 Type 2 diabetes mellitus with diabetic chronic kidney disease: Secondary | ICD-10-CM

## 2017-12-26 DIAGNOSIS — I2510 Atherosclerotic heart disease of native coronary artery without angina pectoris: Secondary | ICD-10-CM | POA: Diagnosis not present

## 2017-12-26 DIAGNOSIS — G4709 Other insomnia: Secondary | ICD-10-CM | POA: Diagnosis not present

## 2017-12-26 DIAGNOSIS — G3184 Mild cognitive impairment, so stated: Secondary | ICD-10-CM | POA: Diagnosis not present

## 2017-12-26 DIAGNOSIS — Z7189 Other specified counseling: Secondary | ICD-10-CM

## 2017-12-26 DIAGNOSIS — G4731 Primary central sleep apnea: Secondary | ICD-10-CM

## 2017-12-26 NOTE — Assessment & Plan Note (Signed)
Mild - will monitor

## 2017-12-26 NOTE — Assessment & Plan Note (Signed)
Ongoing trouble. On lunesta with mild benefit. Found to have complex sleep apnea - see below.

## 2017-12-26 NOTE — Patient Instructions (Addendum)
If interested, check with pharmacy about new 2 shot shingles series (shingrix).  You are doing well today! Continue current medicines.  Bring me copy of your health care power of attorney form.  Double up on aricept dose and let me know how this is tolerated.  Return as needed or in 6 months for follow up visit  Health Maintenance, Male A healthy lifestyle and preventive care is important for your health and wellness. Ask your health care provider about what schedule of regular examinations is right for you. What should I know about weight and diet? Eat a Healthy Diet  Eat plenty of vegetables, fruits, whole grains, low-fat dairy products, and lean protein.  Do not eat a lot of foods high in solid fats, added sugars, or salt.  Maintain a Healthy Weight Regular exercise can help you achieve or maintain a healthy weight. You should:  Do at least 150 minutes of exercise each week. The exercise should increase your heart rate and make you sweat (moderate-intensity exercise).  Do strength-training exercises at least twice a week.  Watch Your Levels of Cholesterol and Blood Lipids  Have your blood tested for lipids and cholesterol every 5 years starting at 82 years of age. If you are at high risk for heart disease, you should start having your blood tested when you are 82 years old. You may need to have your cholesterol levels checked more often if: ? Your lipid or cholesterol levels are high. ? You are older than 82 years of age. ? You are at high risk for heart disease.  What should I know about cancer screening? Many types of cancers can be detected early and may often be prevented. Lung Cancer  You should be screened every year for lung cancer if: ? You are a current smoker who has smoked for at least 30 years. ? You are a former smoker who has quit within the past 15 years.  Talk to your health care provider about your screening options, when you should start screening, and how  often you should be screened.  Colorectal Cancer  Routine colorectal cancer screening usually begins at 82 years of age and should be repeated every 5-10 years until you are 82 years old. You may need to be screened more often if early forms of precancerous polyps or small growths are found. Your health care provider may recommend screening at an earlier age if you have risk factors for colon cancer.  Your health care provider may recommend using home test kits to check for hidden blood in the stool.  A small camera at the end of a tube can be used to examine your colon (sigmoidoscopy or colonoscopy). This checks for the earliest forms of colorectal cancer.  Prostate and Testicular Cancer  Depending on your age and overall health, your health care provider may do certain tests to screen for prostate and testicular cancer.  Talk to your health care provider about any symptoms or concerns you have about testicular or prostate cancer.  Skin Cancer  Check your skin from head to toe regularly.  Tell your health care provider about any new moles or changes in moles, especially if: ? There is a change in a mole's size, shape, or color. ? You have a mole that is larger than a pencil eraser.  Always use sunscreen. Apply sunscreen liberally and repeat throughout the day.  Protect yourself by wearing long sleeves, pants, a wide-brimmed hat, and sunglasses when outside.  What should I  know about heart disease, diabetes, and high blood pressure?  If you are 25-70 years of age, have your blood pressure checked every 3-5 years. If you are 22 years of age or older, have your blood pressure checked every year. You should have your blood pressure measured twice-once when you are at a hospital or clinic, and once when you are not at a hospital or clinic. Record the average of the two measurements. To check your blood pressure when you are not at a hospital or clinic, you can use: ? An automated blood  pressure machine at a pharmacy. ? A home blood pressure monitor.  Talk to your health care provider about your target blood pressure.  If you are between 5-55 years old, ask your health care provider if you should take aspirin to prevent heart disease.  Have regular diabetes screenings by checking your fasting blood sugar level. ? If you are at a normal weight and have a low risk for diabetes, have this test once every three years after the age of 45. ? If you are overweight and have a high risk for diabetes, consider being tested at a younger age or more often.  A one-time screening for abdominal aortic aneurysm (AAA) by ultrasound is recommended for men aged 35-75 years who are current or former smokers. What should I know about preventing infection? Hepatitis B If you have a higher risk for hepatitis B, you should be screened for this virus. Talk with your health care provider to find out if you are at risk for hepatitis B infection. Hepatitis C Blood testing is recommended for:  Everyone born from [birth year] through 1963.  Anyone with known risk factors for hepatitis C.  Sexually Transmitted Diseases (STDs)  You should be screened each year for STDs including gonorrhea and chlamydia if: ? You are sexually active and are younger than 82 years of age. ? You are older than 82 years of age and your health care provider tells you that you are at risk for this type of infection. ? Your sexual activity has changed since you were last screened and you are at an increased risk for chlamydia or gonorrhea. Ask your health care provider if you are at risk.  Talk with your health care provider about whether you are at high risk of being infected with HIV. Your health care provider may recommend a prescription medicine to help prevent HIV infection.  What else can I do?  Schedule regular health, dental, and eye exams.  Stay current with your vaccines (immunizations).  Do not use any tobacco  products, such as cigarettes, chewing tobacco, and e-cigarettes. If you need help quitting, ask your health care provider.  Limit alcohol intake to no more than 2 drinks per day. One drink equals 12 ounces of beer, 5 ounces of wine, or 1 ounces of hard liquor.  Do not use street drugs.  Do not share needles.  Ask your health care provider for help if you need support or information about quitting drugs.  Tell your health care provider if you often feel depressed.  Tell your health care provider if you have ever been abused or do not feel safe at home. This information is not intended to replace advice given to you by your health care provider. Make sure you discuss any questions you have with your health care provider. Document Released: 04/26/2008 Document Revised: 06/27/2016 Document Reviewed: 08/02/2015 Elsevier Interactive Patient Education  [employer].

## 2017-12-26 NOTE — Assessment & Plan Note (Signed)
Stable period - partner notices some progression. They will try 10mg  aricept (doubling up on 5mg  dose) and update me with results.

## 2017-12-26 NOTE — Assessment & Plan Note (Signed)
Chronic, stable. Reviewed trending A1c. Encouraged avoid added sugars in diet.

## 2017-12-26 NOTE — Assessment & Plan Note (Addendum)
Appreciate pulm care. Just started on ASV for sleep

## 2017-12-26 NOTE — Assessment & Plan Note (Signed)
Chronic, stable. Continue pravastatin.

## 2017-12-26 NOTE — Assessment & Plan Note (Addendum)
Advanced directive discussion: Detailed advanced directive scanned into chart 03/2014: HCPOA - Stephen [name]. Does not want prolonged life support, but ok with temporary course including CPR if reversible condition. Would be ok with feeding tube.

## 2017-12-26 NOTE — Assessment & Plan Note (Signed)
Carries this dx. pulm fibrosis with emphysema by CT. Previously declined pulm eval.

## 2017-12-26 NOTE — Progress Notes (Signed)
BP 118/64 (BP Location: Left Arm, Patient Position: Sitting, Cuff Size: Normal)   Pulse 70   Temp 98 F (36.7 C) (Oral)   Ht 5\' 7"  (1.702 m)   Wt 187 lb 4 oz (84.9 kg)   SpO2 98%   BMI 29.33 kg/m    CC: AMW f/u visit Subjective:    Patient ID: Stephen [name], male    DOB: [birth year], 84 y.o.   MRN: [id number]  HPI: Stephen [name] is a 84 y.o. male presenting on 12/26/2017 for Annual Exam (Pt 2.)   Saw Katha Cabal last week for medicare wellness visit. Note reviewed.  No concerns identified. Mini-Cog 18/20. MMSE 12/2016 - 24-26/30 (recall improved with cues). Aricept started 07/2017. Partner endorses slow decline.  Bowels regular.  Voiding well.   Recent dx complex sleep apnea (66 central and mixed apneas, 62 obstructive apneas) as well as moderate periodic limb movement disorder - started on adaptive servo-ventilation as CPAP and BiPAP were ineffective. Has been using for the last 2 weeks. Continues lunesta. requip caused diarrhea. Planning on trying 1 more time.   Preventative: Colonoscopy done 2011 with HP would be due for rpt 2016 (done at [facility]). iFOB negative 2016. Aged out.  Prostate cancer screening - age out Flu yearly Pneumvax 2012, prevnar 2016 Td 05/2008  zostavax 05/2008  Shingrix - discussed Advanced directive discussion: Detailed advanced directive scanned into chart 03/2014: HCPOA - Osvaldo Angst. Does not want prolonged life support, but ok with temporary course including CPR if reversible condition. Would be ok with feeding tube.  Seat belt use discussed Sunscreen use discussed. No changing moles on skin. Known h/o rosacea.Sees derm yearly  Ex smoker remotely Alcohol - <1 beer/day  Lives at [place] [place] with friend - Osvaldo Angst RN.  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 Interim medical history since our last visit reviewed. Allergies and medications reviewed and updated. Outpatient Medications Prior to Visit  Medication Sig Dispense Refill  . aspirin 81 MG EC tablet TAKE 1 TABLET (81 MG TOTAL) BY MOUTH DAILY. 30 tablet 6  . donepezil (ARICEPT) 5 MG tablet TAKE 1 TABLET (5 MG TOTAL) BY MOUTH AT BEDTIME. 30 tablet 6  . doxazosin (CARDURA) 1 MG tablet TAKE 1 TABLET BY MOUTH EVERY DAY 30 tablet 11  . eszopiclone (LUNESTA) 2 MG TABS tablet Take 1 tablet (2 mg total) by mouth at bedtime as needed for sleep. Take immediately before bedtime 30 tablet 5  . FIBER, CORN DEXTRIN, PO Take by mouth.    . finasteride (PROSCAR) 5 MG tablet TAKE 1 TABLET BY MOUTH EVERY DAY 90 tablet 1  . Ivermectin 1 % CREA Apply topically. Apply to face once a day    . metFORMIN (GLUCOPHAGE) 500 MG tablet TAKE 1 TABLET (500 MG TOTAL) BY MOUTH 2 (TWO) TIMES DAILY WITH A MEAL. 180 tablet 0  . metoprolol succinate (TOPROL-XL) 25 MG 24 hr tablet TAKE 1 TABLET BY MOUTH EVERY DAY 90 tablet 0  . Multiple Vitamin (MULTIVITAMIN WITH MINERALS) TABS tablet Take 1 tablet by mouth daily.    . naproxen sodium (ANAPROX) 220 MG tablet Take 220 mg by mouth daily as needed (pain).    Marland Kitchen  nitroGLYCERIN (NITROSTAT) 0.4 MG SL tablet Place 0.4 mg under the tongue every 5 (five) minutes as needed for chest pain.    Marland Kitchen omeprazole (PRILOSEC) 20 MG capsule TAKE 1 CAPSULE BY MOUTH EVERY DAY 30 capsule 6  . pravastatin (PRAVACHOL) 40 MG tablet TAKE 1 TABLET (40 MG TOTAL) BY MOUTH DAILY. 90 tablet 3  . vitamin B-12 (CYANOCOBALAMIN) 500 MCG tablet Take 500 mcg by mouth daily.    . eszopiclone (LUNESTA) 2 MG TABS tablet TAKE 1 TABLET BY MOUTH AT BEDTIME AS NEEDED SLEEP 30 tablet 0   No facility-administered medications prior to visit.      Per HPI unless specifically indicated in ROS section  below Review of Systems     Objective:    BP 118/64 (BP Location: Left Arm, Patient Position: Sitting, Cuff Size: Normal)   Pulse 70   Temp 98 F (36.7 C) (Oral)   Ht 5\' 7"  (1.702 m)   Wt 187 lb 4 oz (84.9 kg)   SpO2 98%   BMI 29.33 kg/m   Wt Readings from Last 3 Encounters:  12/26/17 187 lb 4 oz (84.9 kg)  12/20/17 189 lb (85.7 kg)  09/25/17 191 lb (86.6 kg)    Physical Exam  Constitutional: He is oriented to person, place, and time. He appears well-developed and well-nourished. No distress.  HENT:  Head: Normocephalic and atraumatic.  Right Ear: Hearing, tympanic membrane, external ear and ear canal normal.  Left Ear: Hearing, tympanic membrane, external ear and ear canal normal.  Nose: Nose normal.  Mouth/Throat: Uvula is midline, oropharynx is clear and moist and mucous membranes are normal. No oropharyngeal exudate, posterior oropharyngeal edema or posterior oropharyngeal erythema.  Eyes: Conjunctivae and EOM are normal. Pupils are equal, round, and reactive to light. No scleral icterus.  Neck: Normal range of motion. Neck supple. Carotid bruit is not present. No thyromegaly present.  Cardiovascular: Normal rate, regular rhythm and intact distal pulses.  Murmur (3/6 systolic upper sternal) heard. Pulses:      Radial pulses are 2+ on the right side, and 2+ on the left side.  Pulmonary/Chest: Effort normal and breath sounds normal. No respiratory distress. He has no wheezes. He has no rales.  Abdominal: Soft. Bowel sounds are normal. He exhibits no distension and no mass. There is no tenderness. There is no rebound and no guarding.  Musculoskeletal: Normal range of motion. He exhibits no edema.  Lymphadenopathy:    He has no cervical adenopathy.  Neurological: He is alert and oriented to person, place, and time.  CN grossly intact, station and gait intact  Skin: Skin is warm and dry. No rash noted.  Psychiatric: He has a normal mood and affect. His behavior is normal.  Judgment and thought content normal.  Nursing note and vitals reviewed.  Results for orders placed or performed in visit on 12/20/17  Hemoglobin A1c  Result Value Ref Range   Hgb A1c MFr Bld 6.9 (H) 4.6 - 6.5 %  Comprehensive metabolic panel  Result Value Ref Range   Sodium 136 135 - 145 mEq/L   Potassium 4.6 3.5 - 5.1 mEq/L   Chloride 100 96 - 112 mEq/L   CO2 31 19 - 32 mEq/L   Glucose, Bld 123 (H) 70 - 99 mg/dL   BUN 17 6 - 23 mg/dL   Creatinine, Ser 1.15 0.40 - 1.50 mg/dL   Total Bilirubin 0.7 0.2 - 1.2 mg/dL   Alkaline Phosphatase 50 39 - 117 U/L   AST 20  0 - 37 U/L   ALT 13 0 - 53 U/L   Total Protein 7.3 6.0 - 8.3 g/dL   Albumin 3.9 3.5 - 5.2 g/dL   Calcium 9.4 8.4 - 10.5 mg/dL   GFR 63.75 >60.00 mL/min  Lipid panel  Result Value Ref Range   Cholesterol 131 0 - 200 mg/dL   Triglycerides 134.0 0.0 - 149.0 mg/dL   HDL 51.10 >39.00 mg/dL   VLDL 26.8 0.0 - 40.0 mg/dL   LDL Cholesterol 53 0 - 99 mg/dL   Total CHOL/HDL Ratio 3    NonHDL 80.18   Microalbumin / creatinine urine ratio  Result Value Ref Range   Microalb, Ur 1.4 0.0 - 1.9 mg/dL   Creatinine,U 73.2 mg/dL   Microalb Creat Ratio 1.9 0.0 - 30.0 mg/g      Assessment & Plan:   Problem List Items Addressed This Visit    Advanced care planning/counseling discussion - Primary    Advanced directive discussion: Detailed advanced directive scanned into chart 03/2014: HCPOA - Osvaldo Angst. Does not want prolonged life support, but ok with temporary course including CPR if reversible condition. Would be ok with feeding tube.       Complex sleep apnea syndrome    Appreciate pulm care. Just started on ASV for sleep      Diabetes type 2, controlled (HCC)    Chronic, stable. Reviewed trending A1c. Encouraged avoid added sugars in diet.       HLD (hyperlipidemia)    Chronic, stable. Continue pravastatin.      Insomnia    Ongoing trouble. On lunesta with mild benefit. Found to have complex sleep apnea - see below.        MCI (mild cognitive impairment) with memory loss    Stable period - partner notices some progression. They will try 10mg  aricept (doubling up on 5mg  dose) and update me with results.       PLMD (periodic limb movement disorder)   Pulmonary interstitial fibrosis ([place])    Carries this dx. pulm fibrosis with emphysema by CT. Previously declined pulm eval.      Systolic murmur    Mild - will monitor          No orders of the defined types were placed in this encounter.  No orders of the defined types were placed in this encounter.   Follow up plan: No Follow-up on file.  Ria Bush, MD

## 2017-12-26 NOTE — ACP (Advance Care Planning) (Signed)
Advanced directive discussion: Detailed advanced directive scanned into chart 03/2014: HCPOA - Stephen [name]. Does not want prolonged life support, but ok with temporary course including CPR if reversible condition. Would be ok with feeding tube.

## 2017-12-30 ENCOUNTER — Telehealth: Payer: Self-pay | Admitting: Neurology

## 2017-12-30 NOTE — Telephone Encounter (Signed)
Pts family friend [name] calling stating she would like to speak with RN to discuss rOPINIRole (REQUIP) 0.25 MG tablet

## 2017-12-31 NOTE — Telephone Encounter (Signed)
Called the patient back. pts friend Stephen [name] states that when we last talked about him taking the medication we suggested the patient take the medication at lunch time on full stomach. The patient has been doing that and has not had any episode of diarrhea. The friend states that the medication half life is 6 hours and for him to take it at lunch time she isn't sure he is getting the full benefit of the medication. The friend does states that the patient isn't necessarily complaining of it not lasting but she also informs me that the patient wasn't aware of it happening to begin with. I have advised the patient take it later with his supper as that is closer to bedtime and would hopefully hold him longer. If the patient chooses he would just not like to continue the medication I have informed him they could certainly discuss thoughts at the next apt with the NP. Pt's friend verbalized understanding.

## 2018-01-13 DIAGNOSIS — D0462 Carcinoma in situ of skin of left upper limb, including shoulder: Secondary | ICD-10-CM | POA: Diagnosis not present

## 2018-01-13 DIAGNOSIS — L718 Other rosacea: Secondary | ICD-10-CM | POA: Diagnosis not present

## 2018-01-13 DIAGNOSIS — D485 Neoplasm of uncertain behavior of skin: Secondary | ICD-10-CM | POA: Diagnosis not present

## 2018-01-13 DIAGNOSIS — X32XXXA Exposure to sunlight, initial encounter: Secondary | ICD-10-CM | POA: Diagnosis not present

## 2018-01-13 DIAGNOSIS — L570 Actinic keratosis: Secondary | ICD-10-CM | POA: Diagnosis not present

## 2018-01-13 DIAGNOSIS — Z85828 Personal history of other malignant neoplasm of skin: Secondary | ICD-10-CM | POA: Diagnosis not present

## 2018-01-13 DIAGNOSIS — Z08 Encounter for follow-up examination after completed treatment for malignant neoplasm: Secondary | ICD-10-CM | POA: Diagnosis not present

## 2018-01-13 DIAGNOSIS — D044 Carcinoma in situ of skin of scalp and neck: Secondary | ICD-10-CM | POA: Diagnosis not present

## 2018-01-14 ENCOUNTER — Other Ambulatory Visit: Payer: Self-pay | Admitting: Family Medicine

## 2018-01-18 ENCOUNTER — Other Ambulatory Visit: Payer: Self-pay | Admitting: Family Medicine

## 2018-02-04 ENCOUNTER — Telehealth: Payer: Self-pay | Admitting: Neurology

## 2018-02-04 ENCOUNTER — Encounter: Payer: Self-pay | Admitting: Neurology

## 2018-02-04 NOTE — Telephone Encounter (Signed)
I was able to review a CPAP compliance report today on 04 February 2018 for my patient Stephen [name].  Stephen [name] has used his CPAP 25 out of the last 30 days but still struggles to use the machine for more than 4 hours each day.  Average use of time is 4 hours and 27 minutes, he is using an air curve 10 ASV with an expiratory pressure of 4 cmH2O minimum pressure support of 5 maximum pressure support of 15 cmH2O.   He has some minor air leaks.  most significant is that he has a reduction in his AHI to 3.4/h.    If he can continue to use his machine at the current settings and with the mask of his choice I feel that his apnea is treated as best as possible.   Larey Seat, MD

## 2018-02-04 NOTE — Telephone Encounter (Signed)
Called [name] back and spoke with her about the patient. She states that he is having some difficulty with getting use to the mask for his ASV machine. She states that the RT came out and was working with his on the mask. The pt is complaining of the pressure on it. The RT states that he may could benefit for having the maximum pressure lowered. She was wandering if Dr Brett Fairy will look at his information and see if there was anyway we could help make it to where it doesn't bother him.  She also wanted to inform Dr Brett Fairy that earlier we thought that the ropinrole medication was causing him diarrhea. She said they have found that it was another medication that was causing that. She has started back up with giving him the ropinrole and it is helpful for him and wanted to know if Dr Brett Fairy would be ok with restarting that for him. I have printed the download from his machine and will send this to Dr Brett Fairy to discuss with her, her recommendations and I will call [name] back.

## 2018-02-04 NOTE — Telephone Encounter (Signed)
I opened another phone note- download shows good reduction in AHI.  I hop he can use the machine for 5-6 hours daily, for example during a nap

## 2018-02-04 NOTE — Telephone Encounter (Signed)
Pts friend [name] called stating she is needing to speak with the RN regarding pts ASV machine([name] did not want to go into more detail with me). Please call to advise

## 2018-02-05 ENCOUNTER — Other Ambulatory Visit: Payer: Self-pay | Admitting: Neurology

## 2018-02-05 MED ORDER — ROPINIROLE HCL 0.25 MG PO TABS: 0.2500 mg | ORAL_TABLET | Freq: Every day | ORAL | 2 refills | Status: DC

## 2018-02-05 NOTE — Telephone Encounter (Signed)
"  I was able to review a CPAP compliance report today on 04 February 2018 for my patient Stephen [name].  Stephen [name] has used his CPAP 25 out of the last 30 days but still struggles to use the machine for more than 4 hours each day.  Average use of time is 4 hours and 27 minutes, he is using an air curve 10 ASV with an expiratory pressure of 4 cmH2O minimum pressure support of 5 maximum pressure support of 15 cmH2O.   He has some minor air leaks.  most significant is that he has a reduction in his AHI to 3.4/h.    If he can continue to use his machine at the current settings and with the mask of his choice I feel that his apnea is treated as best as possible. "  I was able to call [name] and go over this information with her. She is fearful on how she would encourage him to use the machine more. I informed her that the current pressure is certainly treating his apnea. He only has 3 times he stops breathing vs in the sleep study where he had 45 times he would stop. She wrote that information in hopes that would encourage him to continue to work with it. I have also informed her that Dr Brett Fairy agreed to restarting the requip and we will send it to the pharmacy on file. She verbalized understanding and appreciated the call back.

## 2018-02-11 ENCOUNTER — Other Ambulatory Visit: Payer: Self-pay | Admitting: Family Medicine

## 2018-02-13 ENCOUNTER — Other Ambulatory Visit: Payer: Self-pay | Admitting: Family Medicine

## 2018-02-13 ENCOUNTER — Other Ambulatory Visit: Payer: Self-pay | Admitting: Cardiovascular Disease

## 2018-02-23 ENCOUNTER — Other Ambulatory Visit: Payer: Self-pay | Admitting: Family Medicine

## 2018-02-24 ENCOUNTER — Encounter: Payer: Self-pay | Admitting: Family Medicine

## 2018-02-25 DIAGNOSIS — D044 Carcinoma in situ of skin of scalp and neck: Secondary | ICD-10-CM | POA: Diagnosis not present

## 2018-02-25 DIAGNOSIS — D0462 Carcinoma in situ of skin of left upper limb, including shoulder: Secondary | ICD-10-CM | POA: Diagnosis not present

## 2018-02-26 ENCOUNTER — Encounter: Payer: Self-pay | Admitting: Nurse Practitioner

## 2018-02-27 NOTE — Progress Notes (Signed)
[facility]  PATIENT: Stephen [name] DOB: 10-31-1927   REASON FOR VISIT: New diagnosis of complex sleep apnea here for initial ASV compliance HISTORY FROM: Patient and [name] significant other    HISTORY OF PRESENT ILLNESS:UPDATE 4/19/2019CM [name], 84 year old male returns for follow-up with history of obstructive sleep apnea here for initial ASV compliance.  He has a very oily face and his mask is sometimes slippery although caregiver states he has been doing better the last week or so.  ASV data shows compliance greater than 4 hours at 60%.  Average usage 5 hours 47 minutes.  Pressure set 4-15 cm.  AHI 2.6 ESS 6.  Fatigue severity scale 30.  He returns for reevaluation 11/14/18CDMervin H Hannold is a 84 y.o. male , seen here in a referral from Dr. Danise Mina for chronic Insomnia,  a condition he has suffered of since at least 20 years.   The patient had failed multiple medications treating insomnia, including Ambien which left him confused, Remeron which did not improve his sleep and actually caused terrible nightmares, melatonin was also without effect, he tried Sonata without success, he has not been placed on primary benzodiazepines. The best result was achieved by using Lunesta which his insurance now fails to cover.  He tried 2 mg with good success.  The patient has an elevated hemoglobin A1c indicative of the presence of diabetes mellitus, he has a history of coronary artery disease, had a cardiac stent placed in May 2015, he is status post cataract extraction, has hypertension, colon polyps, history of basal cell cancer, arthritis, nocturia with benign prostate hyperplasia, and had a myocardial infarction in 2015. He has never been evaluated for insomnia, he has had a sleep study at [facility] while living in [place], [place] . He was told that no apnea was found.    Sleep habits are as follows: His usual bedtime is around 10 PM, after watching TV  and the patient usually does not fall asleep after dinner. He reports that his bedroom is cool, quiet and dark in conducive to sleep.  He usually sleeps on his side, he has one pillow for head support. He does have frequent bathroom breaks, the first one around 2 AM, followed by 3.30 and 5 AM.  His other fragmentation of sleep that he cannot identify.  He seems to arouse spontaneously without discomfort, chest pain, shortness of breath, nausea.  He reports being frustrated not being able to sleep well and sustained. He spontaneously wakes up around 6:30 AM does not need an alarm, and starts his day at this time.    REVIEW OF SYSTEMS: Full 14 system review of systems performed and notable only for those listed, all others are neg:  Constitutional: neg  Cardiovascular: neg Ear/Nose/Throat: Hearing loss Skin: neg Eyes: neg Respiratory: neg Gastroitestinal: neg  Hematology/Lymphatic: neg  Endocrine: Intolerance to cold Musculoskeletal: Joint pain Allergy/Immunology: neg Neurological: neg Psychiatric: neg Sleep : Complex sleep apnea with ASV   ALLERGIES: Allergies  Allergen Reactions  . Ambien [Zolpidem Tartrate]     unknown  . Aricept [Donepezil Hcl] Diarrhea  . Atorvastatin     muscle aches  . Belsomra [Suvorexant] Other (See Comments)    anxiety  . Penicillins Hives    Has patient had a PCN reaction causing immediate rash, facial/tongue/throat swelling, SOB or lightheadedness with hypotension: Yes Has patient had a PCN reaction causing severe rash involving mucus membranes or skin necrosis: Yes Has patient had a PCN reaction that  required hospitalization No Has patient had a PCN reaction occurring within the last 10 years: No If all of the above answers are "NO", then may proceed with Cephalosporin use.   . Tamsulosin Other (See Comments)    Leg weakness  . Doxycycline Rash    HOME MEDICATIONS: Outpatient Medications Prior to Visit  Medication Sig Dispense Refill  .  aspirin 81 MG EC tablet TAKE 1 TABLET (81 MG TOTAL) BY MOUTH DAILY. 30 tablet 6  . doxazosin (CARDURA) 1 MG tablet TAKE 1 TABLET BY MOUTH EVERY DAY 30 tablet 11  . eszopiclone (LUNESTA) 2 MG TABS tablet Take 1 tablet (2 mg total) by mouth at bedtime as needed for sleep. Take immediately before bedtime 30 tablet 5  . FIBER, CORN DEXTRIN, PO Take by mouth.    . finasteride (PROSCAR) 5 MG tablet TAKE 1 TABLET BY MOUTH EVERY DAY 90 tablet 1  . Ivermectin 1 % CREA Apply topically. Apply to face once a day    . metFORMIN (GLUCOPHAGE) 500 MG tablet TAKE 1 TABLET (500 MG TOTAL) BY MOUTH 2 (TWO) TIMES DAILY WITH A MEAL. 180 tablet 1  . metoprolol succinate (TOPROL-XL) 25 MG 24 hr tablet TAKE 1 TABLET BY MOUTH EVERY DAY 90 tablet 0  . Multiple Vitamin (MULTIVITAMIN WITH MINERALS) TABS tablet Take 1 tablet by mouth daily.    . naproxen sodium (ANAPROX) 220 MG tablet Take 220 mg by mouth daily as needed (pain).    . nitroGLYCERIN (NITROSTAT) 0.4 MG SL tablet Place 0.4 mg under the tongue every 5 (five) minutes as needed for chest pain.    Marland Kitchen omeprazole (PRILOSEC) 20 MG capsule TAKE 1 CAPSULE BY MOUTH EVERY DAY 30 capsule 6  . pravastatin (PRAVACHOL) 40 MG tablet TAKE 1 TABLET (40 MG TOTAL) BY MOUTH DAILY. 90 tablet 3  . rOPINIRole (REQUIP) 0.25 MG tablet Take 1 tablet (0.25 mg total) by mouth at bedtime. 90 tablet 2  . vitamin B-12 (CYANOCOBALAMIN) 500 MCG tablet Take 500 mcg by mouth daily.    Marland Kitchen donepezil (ARICEPT) 5 MG tablet TAKE 1 TABLET BY MOUTH EVERYDAY AT BEDTIME (Patient not taking: Reported on 02/28/2018) 30 tablet 6   No facility-administered medications prior to visit.     PAST MEDICAL HISTORY: Past Medical History:  Diagnosis Date  . Arthritis of knee   . BPH (benign prostatic hypertrophy)    w/ nocturia  . Coronary artery disease 03/2014   Inferior ST elevation myocardial infarction. Cardiac catheterization showed an occluded mid RCA. He had an angioplasty and drug-eluting stent placement  with a 3.0 x 16 mm Promus drug-eluting stent. Ejection fraction was 45% by echo, completed cardiac rehab 06/2014  . Diabetes type 2, controlled ([place])   . History of basal cell cancer    s/p mohs  . History of colon polyps   . History of hypertension   . HLD (hyperlipidemia)    diet controlled in past  . Hypertension   . Insomnia    treated with multiple meds in past  . MI (myocardial infarction) ([place])   . Rosacea   . Skin cancer   . UTI (urinary tract infection)     PAST SURGICAL HISTORY: Past Surgical History:  Procedure Laterality Date  . CARDIAC CATHETERIZATION  03/2014   [facility];x1 stent  . CATARACT EXTRACTION W/PHACO Left 10/09/2016   Procedure: CATARACT EXTRACTION PHACO AND INTRAOCULAR LENS PLACEMENT (IOC);  Surgeon: Birder Robson, MD;  Location: [facility] ORS;  Service: Ophthalmology;  Laterality: Left;  [place]  1.13 AP% 18.3 CDE 13.45 Fluid pack lot # [id number]  . COLONOSCOPY  08/2010   hyperplastic polyp, rec rpt 5 yrs  . ESOPHAGOGASTRODUODENOSCOPY  05/2015   dilated stricture, normal biopsies, HH, no definite infection [name] @ [facility])  . MOHS SURGERY     basal cell chin/back  . REPLACEMENT TOTAL KNEE Left 08/2006  . VASECTOMY  1970    FAMILY HISTORY: Family History  Problem Relation Age of Onset  . Cancer Father 76       prostate and colon  . Cancer Mother        ovarian or uterine  . CAD Brother 49       MI, smoker  . Parkinson's disease Brother   . COPD Brother   . CAD Brother        MI  . Stroke Sister     SOCIAL HISTORY: Social History   Socioeconomic History  . Marital status: Widowed    Spouse name: Not on file  . Number of children: Not on file  . Years of education: Not on file  . Highest education level: Not on file  Occupational History  . Not on file  Social Needs  . Financial resource strain: Not on file  . Food insecurity:    Worry: Not on file    Inability: Not on file  . Transportation needs:    Medical: Not on file    Non-medical: Not  on file  Tobacco Use  . Smoking status: Former Smoker    Last attempt to quit: 11/12/1962    Years since quitting: 55.3  . Smokeless tobacco: Never Used  . Tobacco comment: Quit 1964  Substance and Sexual Activity  . Alcohol use: Yes    Alcohol/week: 0.6 oz    Types: 1 Cans of beer per week  . Drug use: No  . Sexual activity: Never  Lifestyle  . Physical activity:    Days per week: Not on file    Minutes per session: Not on file  . Stress: Not on file  Relationships  . Social connections:    Talks on phone: Not on file    Gets together: Not on file    Attends religious service: Not on file    Active member of club or organization: Not on file    Attends meetings of clubs or organizations: Not on file    Relationship status: Not on file  . Intimate partner violence:    Fear of current or ex partner: Not on file    Emotionally abused: Not on file    Physically abused: Not on file    Forced sexual activity: Not on file  Other Topics Concern  . Not on file  Social History Narrative   Lives at [facility] with friend - Osvaldo Angst RN.   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 Vegetarian.     PHYSICAL EXAM  Vitals:   02/28/18 0943  BP: 115/72  Pulse: 61  Weight: 192 lb 6.4 oz (87.3 kg)  Height: 5\' 7"  (1.702 m)   Body mass index is 30.13 kg/m.  Generalized: Well developed, obese male in no acute distress  Head: normocephalic and atraumatic,. Oropharynx benign mall[place]atti 3 Neck: Supple, circumference 17.5 Musculoskeletal: No deformity   Neurological examination   Mentation: Alert oriented to time, place,  history taking. Attention span and concentration appropriate. Recent and remote memory intact.  Follows all commands speech and language fluent.   Cranial nerve II-XII: Pupils were equal round  reactive to light extraocular movements were full, visual field were full on confrontational test. Facial sensation and strength were normal. hearing was intact to finger rubbing bilaterally. Uvula tongue midline. head turning and shoulder shrug were normal and symmetric.Tongue protrusion into cheek strength was normal. Motor: normal bulk and tone, full strength in the BUE, BLE,  Sensory: normal and symmetric to light touch,  Coordination: finger-nose-finger,  no dysmetria Gait and Station: Rising up from seated position without assistance, normal stance, no assistive device   DIAGNOSTIC DATA (LABS, IMAGING, TESTING) - I reviewed patient records, labs, notes, testing and imaging myself where available.  Lab Results  Component Value Date   WBC 7.6 12/12/2015   HGB 15.0 12/12/2015   HCT 45.3 12/12/2015   MCV 97.6 12/12/2015   PLT 275.0 12/12/2015      Component Value Date/Time   NA 136 12/20/2017 0859   NA 138 03/23/2014 1612   K 4.6 12/20/2017 0859   K 4.3 03/23/2014 1612   K 5.1 05/22/2013   CL 100 12/20/2017 0859   CL 105 03/23/2014 1612   CO2 31 12/20/2017 0859   CO2 29 03/23/2014 1612   GLUCOSE 123 (H) 12/20/2017 0859   GLUCOSE 128 (H) 03/23/2014 1612   BUN 17 12/20/2017 0859   BUN 22 (H) 03/23/2014 1612   CREATININE 1.15 12/20/2017 0859   CREATININE 1.30 03/23/2014 1612   CREATININE 1.14 05/22/2013   CALCIUM 9.4 12/20/2017 0859   CALCIUM 9.2 03/23/2014 1612   PROT 7.3 12/20/2017 0859   PROT 6.5 05/04/2014 0806   PROT 7.6 03/23/2014 1612   ALBUMIN 3.9 12/20/2017 0859   ALBUMIN 4.2 05/04/2014 0806   ALBUMIN 4.1 03/23/2014 1612   AST 20 12/20/2017 0859   AST 45 (H) 03/23/2014 1612   AST 25 05/22/2013   ALT 13 12/20/2017 0859   ALT 27 03/23/2014 1612   ALKPHOS 50 12/20/2017 0859   ALKPHOS 79 03/23/2014 1612   ALKPHOS 59 05/22/2013   BILITOT 0.7 12/20/2017 0859   BILITOT 0.4 03/23/2014 1612   BILITOT 0.7 05/22/2013   GFRNONAA 50 (L) 03/23/2014 1612   GFRAA 58 (L)  03/23/2014 1612   Lab Results  Component Value Date   CHOL 131 12/20/2017   HDL 51.10 12/20/2017   LDLCALC 53 12/20/2017   TRIG 134.0 12/20/2017   CHOLHDL 3 12/20/2017   Lab Results  Component Value Date   HGBA1C 6.9 (H) 12/20/2017   Lab Results  Component Value Date   VITAMINB12 625 12/12/2015   Lab Results  Component Value Date   TSH 1.53 12/12/2015      ASSESSMENT AND PLAN  84 y.o. year old male recently diagnosed with complex sleep apnea here for ASV compliance.  Patient continues to have problems with mask fit however leak is better over the last week   PLAN: ASV compliance 60%, needs to be 70% Check with your equipment company about a liner for your mask for better fit Use an astringent for your oily face Follow-up in 3 months for repeat compliance Dennie Bible, [facility], [facility], [facility] [address], [place], [place] [address] [phone]

## 2018-02-28 ENCOUNTER — Ambulatory Visit (INDEPENDENT_AMBULATORY_CARE_PROVIDER_SITE_OTHER): Payer: Medicare Other | Admitting: Nurse Practitioner

## 2018-02-28 ENCOUNTER — Encounter: Payer: Self-pay | Admitting: Nurse Practitioner

## 2018-02-28 VITALS — BP 115/72 | HR 61 | Ht 67.0 in | Wt 192.4 lb

## 2018-02-28 DIAGNOSIS — I2510 Atherosclerotic heart disease of native coronary artery without angina pectoris: Secondary | ICD-10-CM | POA: Diagnosis not present

## 2018-02-28 DIAGNOSIS — G4731 Primary central sleep apnea: Secondary | ICD-10-CM

## 2018-02-28 NOTE — Patient Instructions (Addendum)
ASV compliance 60%, needs to be 70% Check with your equipment company about a liner for your mask for better fit Use an astringent for your oily face Follow-up in 3 months for repeat compliance

## 2018-03-07 ENCOUNTER — Encounter: Payer: Self-pay | Admitting: Family Medicine

## 2018-03-08 MED ORDER — MEMANTINE HCL 5 MG PO TABS
5.0000 mg | ORAL_TABLET | Freq: Every day | ORAL | 3 refills | Status: DC
Start: 2018-03-08 — End: 2018-04-21

## 2018-03-12 DIAGNOSIS — I639 Cerebral infarction, unspecified: Secondary | ICD-10-CM

## 2018-03-12 HISTORY — DX: Cerebral infarction, unspecified: I63.9

## 2018-03-15 ENCOUNTER — Emergency Department: Payer: Medicare Other

## 2018-03-15 ENCOUNTER — Inpatient Hospital Stay
Admission: EM | Admit: 2018-03-15 | Discharge: 2018-03-17 | DRG: 064 | Disposition: A | Discharge status: Home / Self Care | Payer: Medicare Other | Attending: Internal Medicine | Admitting: Internal Medicine

## 2018-03-15 ENCOUNTER — Encounter: Payer: Self-pay | Admitting: Emergency Medicine

## 2018-03-15 ENCOUNTER — Other Ambulatory Visit: Payer: Self-pay

## 2018-03-15 ENCOUNTER — Inpatient Hospital Stay: Payer: Medicare Other

## 2018-03-15 DIAGNOSIS — I63233 Cerebral infarction due to unspecified occlusion or stenosis of bilateral carotid arteries: Secondary | ICD-10-CM | POA: Diagnosis not present

## 2018-03-15 DIAGNOSIS — I2510 Atherosclerotic heart disease of native coronary artery without angina pectoris: Secondary | ICD-10-CM | POA: Diagnosis present

## 2018-03-15 DIAGNOSIS — I10 Essential (primary) hypertension: Secondary | ICD-10-CM | POA: Diagnosis present

## 2018-03-15 DIAGNOSIS — Z800 Family history of malignant neoplasm of digestive organs: Secondary | ICD-10-CM | POA: Diagnosis not present

## 2018-03-15 DIAGNOSIS — G9341 Metabolic encephalopathy: Secondary | ICD-10-CM | POA: Diagnosis present

## 2018-03-15 DIAGNOSIS — I252 Old myocardial infarction: Secondary | ICD-10-CM

## 2018-03-15 DIAGNOSIS — R410 Disorientation, unspecified: Secondary | ICD-10-CM | POA: Diagnosis not present

## 2018-03-15 DIAGNOSIS — Z825 Family history of asthma and other chronic lower respiratory diseases: Secondary | ICD-10-CM

## 2018-03-15 DIAGNOSIS — G4700 Insomnia, unspecified: Secondary | ICD-10-CM | POA: Diagnosis present

## 2018-03-15 DIAGNOSIS — E119 Type 2 diabetes mellitus without complications: Secondary | ICD-10-CM | POA: Diagnosis present

## 2018-03-15 DIAGNOSIS — Z86010 Personal history of colonic polyps: Secondary | ICD-10-CM

## 2018-03-15 DIAGNOSIS — Z8042 Family history of malignant neoplasm of prostate: Secondary | ICD-10-CM

## 2018-03-15 DIAGNOSIS — R402242 Coma scale, best verbal response, confused conversation, at arrival to emergency department: Secondary | ICD-10-CM | POA: Diagnosis present

## 2018-03-15 DIAGNOSIS — Z96652 Presence of left artificial knee joint: Secondary | ICD-10-CM | POA: Diagnosis present

## 2018-03-15 DIAGNOSIS — Z9842 Cataract extraction status, left eye: Secondary | ICD-10-CM | POA: Diagnosis not present

## 2018-03-15 DIAGNOSIS — Z836 Family history of other diseases of the respiratory system: Secondary | ICD-10-CM

## 2018-03-15 DIAGNOSIS — R29704 NIHSS score 4: Secondary | ICD-10-CM | POA: Diagnosis present

## 2018-03-15 DIAGNOSIS — Z881 Allergy status to other antibiotic agents status: Secondary | ICD-10-CM

## 2018-03-15 DIAGNOSIS — R4701 Aphasia: Secondary | ICD-10-CM | POA: Diagnosis present

## 2018-03-15 DIAGNOSIS — I639 Cerebral infarction, unspecified: Secondary | ICD-10-CM | POA: Insufficient documentation

## 2018-03-15 DIAGNOSIS — Z66 Do not resuscitate: Secondary | ICD-10-CM | POA: Diagnosis present

## 2018-03-15 DIAGNOSIS — I6529 Occlusion and stenosis of unspecified carotid artery: Secondary | ICD-10-CM | POA: Diagnosis not present

## 2018-03-15 DIAGNOSIS — Z961 Presence of intraocular lens: Secondary | ICD-10-CM | POA: Diagnosis present

## 2018-03-15 DIAGNOSIS — E785 Hyperlipidemia, unspecified: Secondary | ICD-10-CM | POA: Diagnosis present

## 2018-03-15 DIAGNOSIS — I6623 Occlusion and stenosis of bilateral posterior cerebral arteries: Secondary | ICD-10-CM | POA: Diagnosis not present

## 2018-03-15 DIAGNOSIS — M1710 Unilateral primary osteoarthritis, unspecified knee: Secondary | ICD-10-CM | POA: Diagnosis present

## 2018-03-15 DIAGNOSIS — Z7984 Long term (current) use of oral hypoglycemic drugs: Secondary | ICD-10-CM

## 2018-03-15 DIAGNOSIS — Z8041 Family history of malignant neoplasm of ovary: Secondary | ICD-10-CM

## 2018-03-15 DIAGNOSIS — Z8249 Family history of ischemic heart disease and other diseases of the circulatory system: Secondary | ICD-10-CM

## 2018-03-15 DIAGNOSIS — R402362 Coma scale, best motor response, obeys commands, at arrival to emergency department: Secondary | ICD-10-CM | POA: Diagnosis present

## 2018-03-15 DIAGNOSIS — Z7982 Long term (current) use of aspirin: Secondary | ICD-10-CM

## 2018-03-15 DIAGNOSIS — Z85828 Personal history of other malignant neoplasm of skin: Secondary | ICD-10-CM

## 2018-03-15 DIAGNOSIS — R4182 Altered mental status, unspecified: Secondary | ICD-10-CM | POA: Diagnosis not present

## 2018-03-15 DIAGNOSIS — Z880 Allergy status to penicillin: Secondary | ICD-10-CM

## 2018-03-15 DIAGNOSIS — Z888 Allergy status to other drugs, medicaments and biological substances status: Secondary | ICD-10-CM

## 2018-03-15 DIAGNOSIS — G3184 Mild cognitive impairment, so stated: Secondary | ICD-10-CM | POA: Diagnosis present

## 2018-03-15 DIAGNOSIS — F0390 Unspecified dementia without behavioral disturbance: Secondary | ICD-10-CM | POA: Diagnosis not present

## 2018-03-15 DIAGNOSIS — Z823 Family history of stroke: Secondary | ICD-10-CM

## 2018-03-15 DIAGNOSIS — Z820 Family history of epilepsy and other diseases of the nervous system: Secondary | ICD-10-CM

## 2018-03-15 DIAGNOSIS — F0150 Vascular dementia without behavioral disturbance: Secondary | ICD-10-CM | POA: Diagnosis present

## 2018-03-15 DIAGNOSIS — Z87891 Personal history of nicotine dependence: Secondary | ICD-10-CM

## 2018-03-15 DIAGNOSIS — I63232 Cerebral infarction due to unspecified occlusion or stenosis of left carotid arteries: Secondary | ICD-10-CM | POA: Diagnosis not present

## 2018-03-15 DIAGNOSIS — R402142 Coma scale, eyes open, spontaneous, at arrival to emergency department: Secondary | ICD-10-CM | POA: Diagnosis present

## 2018-03-15 DIAGNOSIS — R29810 Facial weakness: Secondary | ICD-10-CM | POA: Diagnosis present

## 2018-03-15 DIAGNOSIS — Z8673 Personal history of transient ischemic attack (TIA), and cerebral infarction without residual deficits: Secondary | ICD-10-CM | POA: Diagnosis present

## 2018-03-15 LAB — CBC
HCT: 37.9 % — ABNORMAL LOW (ref 40.0–52.0)
Hemoglobin: 12.4 g/dL — ABNORMAL LOW (ref 13.0–18.0)
MCH: 29.5 pg (ref 26.0–34.0)
MCHC: 32.7 g/dL (ref 32.0–36.0)
MCV: 90 fL (ref 80.0–100.0)
Platelets: 297 10*3/uL (ref 150–440)
RBC: 4.21 MIL/uL — ABNORMAL LOW (ref 4.40–5.90)
RDW: 14.7 % — ABNORMAL HIGH (ref 11.5–14.5)
WBC: 7.2 10*3/uL (ref 3.8–10.6)

## 2018-03-15 LAB — URINE DRUG SCREEN, QUALITATIVE (ARMC ONLY)
Amphetamines, Ur Screen: NOT DETECTED
BARBITURATES, UR SCREEN: NOT DETECTED
BENZODIAZEPINE, UR SCRN: NOT DETECTED
Cannabinoid 50 Ng, Ur ~~LOC~~: NOT DETECTED
Cocaine Metabolite,Ur ~~LOC~~: NOT DETECTED
MDMA (Ecstasy)Ur Screen: NOT DETECTED
Methadone Scn, Ur: NOT DETECTED
OPIATE, UR SCREEN: NOT DETECTED
Phencyclidine (PCP) Ur S: NOT DETECTED
Tricyclic, Ur Screen: NOT DETECTED

## 2018-03-15 LAB — COMPREHENSIVE METABOLIC PANEL
ALK PHOS: 55 U/L (ref 38–126)
ALT: 14 U/L — AB (ref 17–63)
ANION GAP: 8 (ref 5–15)
AST: 30 U/L (ref 15–41)
Albumin: 3.7 g/dL (ref 3.5–5.0)
BUN: 14 mg/dL (ref 6–20)
CALCIUM: 8.7 mg/dL — AB (ref 8.9–10.3)
CO2: 23 mmol/L (ref 22–32)
CREATININE: 1.12 mg/dL (ref 0.61–1.24)
Chloride: 104 mmol/L (ref 101–111)
GFR calc non Af Amer: 57 mL/min — ABNORMAL LOW (ref >60)
GLUCOSE: 196 mg/dL — AB (ref 65–99)
Potassium: 4.2 mmol/L (ref 3.5–5.1)
SODIUM: 135 mmol/L (ref 135–145)
Total Bilirubin: 0.6 mg/dL (ref 0.3–1.2)
Total Protein: 7 g/dL (ref 6.5–8.1)

## 2018-03-15 LAB — URINALYSIS, ROUTINE W REFLEX MICROSCOPIC
Bilirubin Urine: NEGATIVE
GLUCOSE, UA: NEGATIVE mg/dL
HGB URINE DIPSTICK: NEGATIVE
KETONES UR: NEGATIVE mg/dL
Leukocytes, UA: NEGATIVE
Nitrite: NEGATIVE
PROTEIN: NEGATIVE mg/dL
Specific Gravity, Urine: 1.015 (ref 1.005–1.030)
pH: 5 (ref 5.0–8.0)

## 2018-03-15 LAB — ETHANOL

## 2018-03-15 LAB — DIFFERENTIAL
BASOS PCT: 1 %
Basophils Absolute: 0.1 10*3/uL (ref 0–0.1)
Eosinophils Absolute: 0.2 10*3/uL (ref 0–0.7)
Eosinophils Relative: 3 %
Lymphocytes Relative: 23 %
Lymphs Abs: 1.6 10*3/uL (ref 1.0–3.6)
MONO ABS: 0.6 10*3/uL (ref 0.2–1.0)
MONOS PCT: 8 %
Neutro Abs: 4.7 10*3/uL (ref 1.4–6.5)
Neutrophils Relative %: 65 %

## 2018-03-15 LAB — PROTIME-INR
INR: 1.05
Prothrombin Time: 13.6 seconds (ref 11.4–15.2)

## 2018-03-15 LAB — APTT: aPTT: 33 seconds (ref 24–36)

## 2018-03-15 LAB — TROPONIN I: Troponin I: <0.03 ng/mL (ref <0.03)

## 2018-03-15 LAB — GLUCOSE, CAPILLARY: Glucose-Capillary: 107 mg/dL — ABNORMAL HIGH (ref 65–99)

## 2018-03-15 MED ORDER — ASPIRIN 81 MG PO CHEW
324.0000 mg | CHEWABLE_TABLET | Freq: Once | ORAL | Status: AC
  Administered 2018-03-15: 324 mg via ORAL
  Filled 2018-03-15: qty 4

## 2018-03-15 MED ORDER — ACETAMINOPHEN 650 MG RE SUPP: 650.0000 mg | RECTAL | Status: DC | PRN

## 2018-03-15 MED ORDER — STROKE: EARLY STAGES OF RECOVERY BOOK
Freq: Once | Status: AC
  Administered 2018-03-15: 14:00:00

## 2018-03-15 MED ORDER — METOPROLOL SUCCINATE ER 25 MG PO TB24
25.0000 mg | ORAL_TABLET | Freq: Every day | ORAL | Status: DC
  Administered 2018-03-16: 25 mg via ORAL
  Filled 2018-03-15: qty 1

## 2018-03-15 MED ORDER — PRAVASTATIN SODIUM 20 MG PO TABS
40.0000 mg | ORAL_TABLET | Freq: Every day | ORAL | Status: DC
  Administered 2018-03-15 – 2018-03-17 (×3): 40 mg via ORAL
  Filled 2018-03-15: qty 1
  Filled 2018-03-15 (×3): qty 2
  Filled 2018-03-15: qty 1

## 2018-03-15 MED ORDER — FINASTERIDE 5 MG PO TABS
5.0000 mg | ORAL_TABLET | Freq: Every day | ORAL | Status: DC
  Administered 2018-03-16 – 2018-03-17 (×2): 5 mg via ORAL
  Filled 2018-03-15 (×2): qty 1

## 2018-03-15 MED ORDER — INSULIN ASPART 100 UNIT/ML ~~LOC~~ SOLN: 0.0000 [IU] | Freq: Three times a day (TID) | SUBCUTANEOUS | Status: DC

## 2018-03-15 MED ORDER — PANTOPRAZOLE SODIUM 40 MG PO TBEC
40.0000 mg | DELAYED_RELEASE_TABLET | Freq: Every day | ORAL | Status: DC
Start: 2018-03-16 — End: 2018-03-17
  Administered 2018-03-16 – 2018-03-17 (×2): 40 mg via ORAL
  Filled 2018-03-15 (×2): qty 1

## 2018-03-15 MED ORDER — ROPINIROLE HCL 0.25 MG PO TABS
0.2500 mg | ORAL_TABLET | Freq: Every day | ORAL | Status: DC
  Administered 2018-03-15 – 2018-03-16 (×2): 0.25 mg via ORAL
  Filled 2018-03-15 (×3): qty 1

## 2018-03-15 MED ORDER — INSULIN ASPART 100 UNIT/ML ~~LOC~~ SOLN: 0.0000 [IU] | Freq: Every day | SUBCUTANEOUS | Status: DC

## 2018-03-15 MED ORDER — ENOXAPARIN SODIUM 40 MG/0.4ML ~~LOC~~ SOLN
40.0000 mg | Freq: Every day | SUBCUTANEOUS | Status: DC
  Administered 2018-03-15 – 2018-03-17 (×3): 40 mg via SUBCUTANEOUS
  Filled 2018-03-15 (×3): qty 0.4

## 2018-03-15 MED ORDER — MEMANTINE HCL 5 MG PO TABS
5.0000 mg | ORAL_TABLET | Freq: Every day | ORAL | Status: DC
  Administered 2018-03-16 – 2018-03-17 (×2): 5 mg via ORAL
  Filled 2018-03-15 (×2): qty 1

## 2018-03-15 MED ORDER — ACETAMINOPHEN 325 MG PO TABS: 650.0000 mg | ORAL_TABLET | ORAL | Status: DC | PRN

## 2018-03-15 MED ORDER — DOXAZOSIN MESYLATE 1 MG PO TABS
1.0000 mg | ORAL_TABLET | Freq: Every day | ORAL | Status: DC
  Administered 2018-03-16: 1 mg via ORAL
  Filled 2018-03-15 (×2): qty 1

## 2018-03-15 MED ORDER — ASPIRIN EC 81 MG PO TBEC
81.0000 mg | DELAYED_RELEASE_TABLET | Freq: Every day | ORAL | Status: DC
  Administered 2018-03-16 – 2018-03-17 (×2): 81 mg via ORAL
  Filled 2018-03-15 (×2): qty 1

## 2018-03-15 MED ORDER — VITAMIN B-12 1000 MCG PO TABS
500.0000 ug | ORAL_TABLET | Freq: Every day | ORAL | Status: DC
  Administered 2018-03-16 – 2018-03-17 (×2): 500 ug via ORAL
  Filled 2018-03-15 (×3): qty 1

## 2018-03-15 MED ORDER — ACETAMINOPHEN 160 MG/5ML PO SOLN
650.0000 mg | ORAL | Status: DC | PRN
  Filled 2018-03-15: qty 20.3

## 2018-03-15 NOTE — ED Notes (Signed)
Pt to MRI

## 2018-03-15 NOTE — ED Notes (Signed)
Pt to CT

## 2018-03-15 NOTE — H&P (Signed)
[place] [facility] [place] NAME: Stephen [name]    MR#:  [id number]  DATE OF BIRTH:  1927/02/27  DATE OF ADMISSION:  03/15/2018  PRIMARY CARE PHYSICIAN: Ria Bush, MD   REQUESTING/REFERRING PHYSICIAN: Dr. Quentin Cornwall  CHIEF COMPLAINT:   Chief Complaint  Patient presents with  . Altered Mental Status    HISTORY OF PRESENT ILLNESS:  Stephen [name]  is a 84 y.o. male with a known history of CAD, hypertension, diabetes, mild cognitive impairment lives with his partner of 20 years at [facility] independent living.  At baseline ambulates on his own.  He seems to enjoy golfing once a week and ambulates on his own.  Patient was last seen well at 10 PM prior to sleeping.  Today morning on waking up patient was found to have mild right-sided facial droop right-sided weakness and confusion.  Some slurred speech which is improved.  History obtained from his domestic partner of 19 years.  [name].  Patient presently is unable to contribute to history.  Old records reviewed.  Discussed with his significant other at bedside.  Discussed with ED staff.  MRI of the brain shows left-sided acute CVA  PAST MEDICAL HISTORY:   Past Medical History:  Diagnosis Date  . Arthritis of knee   . BPH (benign prostatic hypertrophy)    w/ nocturia  . Coronary artery disease 03/2014   Inferior ST elevation myocardial infarction. Cardiac catheterization showed an occluded mid RCA. He had an angioplasty and drug-eluting stent placement with a 3.0 x 16 mm Promus drug-eluting stent. Ejection fraction was 45% by echo, completed cardiac rehab 06/2014  . Diabetes type 2, controlled ([place])   . History of basal cell cancer    s/p mohs  . History of colon polyps   . History of hypertension   . HLD (hyperlipidemia)    diet controlled in past  . Hypertension   . Insomnia    treated with multiple meds in past  . MI (myocardial infarction) ([place])   . Rosacea   . Skin cancer    . UTI (urinary tract infection)     PAST SURGICAL HISTORY:   Past Surgical History:  Procedure Laterality Date  . CARDIAC CATHETERIZATION  03/2014   [facility];x1 stent  . CATARACT EXTRACTION W/PHACO Left 10/09/2016   Procedure: CATARACT EXTRACTION PHACO AND INTRAOCULAR LENS PLACEMENT (IOC);  Surgeon: Birder Robson, MD;  Location: [facility] ORS;  Service: Ophthalmology;  Laterality: Left;  [place] 1.13 AP% 18.3 CDE 13.45 Fluid pack lot # [id number]  . COLONOSCOPY  08/2010   hyperplastic polyp, rec rpt 5 yrs  . ESOPHAGOGASTRODUODENOSCOPY  05/2015   dilated stricture, normal biopsies, HH, no definite infection [name] @ [facility])  . MOHS SURGERY     basal cell chin/back  . REPLACEMENT TOTAL KNEE Left 08/2006  . VASECTOMY  1970    SOCIAL HISTORY:   Social History   Tobacco Use  . Smoking status: Former Smoker    Last attempt to quit: 11/12/1962    Years since quitting: 55.3  . Smokeless tobacco: Never Used  . Tobacco comment: Quit 1964  Substance Use Topics  . Alcohol use: Yes    Alcohol/week: 0.6 oz    Types: 1 Cans of beer per week    FAMILY HISTORY:   Family History  Problem Relation Age of Onset  . Cancer Father 53       prostate and colon  . Cancer Mother  ovarian or uterine  . CAD Brother 20       MI, smoker  . Parkinson's disease Brother   . COPD Brother   . CAD Brother        MI  . Stroke Sister     DRUG ALLERGIES:   Allergies  Allergen Reactions  . Ambien [Zolpidem Tartrate]     unknown  . Aricept [Donepezil Hcl] Diarrhea  . Atorvastatin     muscle aches  . Belsomra [Suvorexant] Other (See Comments)    anxiety  . Penicillins Hives    Has patient had a PCN reaction causing immediate rash, facial/tongue/throat swelling, SOB or lightheadedness with hypotension: Yes Has patient had a PCN reaction causing severe rash involving mucus membranes or skin necrosis: Yes Has patient had a PCN reaction that required hospitalization No Has patient had a PCN reaction  occurring within the last 10 years: No If all of the above answers are "NO", then may proceed with Cephalosporin use.   . Tamsulosin Other (See Comments)    Leg weakness  . Doxycycline Rash    REVIEW OF SYSTEMS:   Review of Systems  Unable to perform ROS: Mental status change    MEDICATIONS AT HOME:   Prior to Admission medications   Medication Sig Start Date End Date Taking? Authorizing Provider  aspirin 81 MG EC tablet TAKE 1 TABLET (81 MG TOTAL) BY MOUTH DAILY. 11/29/15  Yes Ria Bush, MD  doxazosin (CARDURA) 1 MG tablet TAKE 1 TABLET BY MOUTH EVERY DAY 01/14/18  Yes Ria Bush, MD  eszopiclone (LUNESTA) 2 MG TABS tablet Take 1 tablet (2 mg total) by mouth at bedtime as needed for sleep. Take immediately before bedtime 12/20/17  Yes Dohmeier, Asencion Partridge, MD  finasteride (PROSCAR) 5 MG tablet TAKE 1 TABLET BY MOUTH EVERY DAY 02/13/18  Yes Ria Bush, MD  memantine (NAMENDA) 5 MG tablet Take 1 tablet (5 mg total) by mouth daily. 03/08/18  Yes Ria Bush, MD  metFORMIN (GLUCOPHAGE) 500 MG tablet TAKE 1 TABLET (500 MG TOTAL) BY MOUTH 2 (TWO) TIMES DAILY WITH A MEAL. 01/20/18  Yes Ria Bush, MD  metoprolol succinate (TOPROL-XL) 25 MG 24 hr tablet TAKE 1 TABLET BY MOUTH EVERY DAY 02/13/18  Yes Wellington Hampshire, MD  Multiple Vitamin (MULTIVITAMIN WITH MINERALS) TABS tablet Take 1 tablet by mouth daily.   Yes [provider]  omeprazole (PRILOSEC) 20 MG capsule TAKE 1 CAPSULE BY MOUTH EVERY DAY 02/11/18  Yes Ria Bush, MD  pravastatin (PRAVACHOL) 40 MG tablet TAKE 1 TABLET (40 MG TOTAL) BY MOUTH DAILY. 02/25/18  Yes Ria Bush, MD  rOPINIRole (REQUIP) 0.25 MG tablet Take 1 tablet (0.25 mg total) by mouth at bedtime. 02/05/18  Yes Dohmeier, Asencion Partridge, MD  vitamin B-12 (CYANOCOBALAMIN) 500 MCG tablet Take 500 mcg by mouth daily.   Yes [provider]  naproxen sodium (ANAPROX) 220 MG tablet Take 220 mg by mouth daily as needed (pain).     [provider]     VITAL SIGNS:  Blood pressure (!) 148/82, pulse 64, temperature (!) 96.4 F (35.8 C), temperature source Axillary, resp. rate 17, weight 87.1 kg (192 lb), SpO2 100 %.  PHYSICAL EXAMINATION:  Physical Exam  GENERAL:  84 y.o.-year-old patient lying in the bed with no acute distress.  EYES: Pupils equal, round, reactive to light and accommodation. No scleral icterus. Extraocular muscles intact.  HEENT: Head atraumatic, normocephalic. Oropharynx and nasopharynx clear. No oropharyngeal erythema, moist oral mucosa  NECK:  Supple,  no jugular venous distention. No thyroid enlargement, no tenderness.  LUNGS: Normal breath sounds bilaterally, no wheezing, rales, rhonchi. No use of accessory muscles of respiration.  CARDIOVASCULAR: S1, S2 normal. No murmurs, rubs, or gallops.  ABDOMEN: Soft, nontender, nondistended. Bowel sounds present. No organomegaly or mass.  EXTREMITIES: No pedal edema, cyanosis, or clubbing. + 2 pedal & radial pulses b/l.   NEUROLOGIC: Cranial nerves II through XII are intact. No focal Motor or sensory deficits appreciated b/l.  Right lower extremity 4+/5.  Other extremities 5/5. PSYCHIATRIC: The patient is alert and awake.  Pleasantly confused SKIN: No obvious rash, lesion, or ulcer.   LABORATORY PANEL:   CBC Recent Labs  Lab 03/15/18 0853  WBC 7.2  HGB 12.4*  HCT 37.9*  PLT 297   ------------------------------------------------------------------------------------------------------------------  Chemistries  Recent Labs  Lab 03/15/18 0853  NA 135  K 4.2  CL 104  CO2 23  GLUCOSE 196*  BUN 14  CREATININE 1.12  CALCIUM 8.7*  AST 30  ALT 14*  ALKPHOS 55  BILITOT 0.6   ------------------------------------------------------------------------------------------------------------------  Cardiac Enzymes Recent Labs  Lab 03/15/18 0853  TROPONINI <0.03    ------------------------------------------------------------------------------------------------------------------  RADIOLOGY:  Ct Head Wo Contrast  Result Date: 03/15/2018 CLINICAL DATA:  Acute mental status changes, disoriented. EXAM: CT HEAD WITHOUT CONTRAST TECHNIQUE: Contiguous axial images were obtained from the base of the skull through the vertex without intravenous contrast. COMPARISON:  None. FINDINGS: Brain: The brain demonstrates diffuse cortical atrophy as well as mild periventricular small vessel disease. The brain demonstrates no evidence of hemorrhage, infarction, edema, mass effect, extra-axial fluid collection, hydrocephalus or mass lesion. Vascular: No hyperdense vessel or unexpected calcification. Skull: Normal. Negative for fracture or focal lesion. Sinuses/Orbits: No acute finding. Other: None. IMPRESSION: No acute findings. Diffuse cortical atrophy and mild small vessel disease. Electronically Signed   By: Aletta Edouard M.D.   On: 03/15/2018 09:27   Mr Angiogram Head Wo Contrast  Result Date: 03/15/2018 CLINICAL DATA:  Focal neuro deficit of greater than 6 hours. Stroke suspected. Acute mental status changes and disorientation. EXAM: MRI HEAD WITHOUT CONTRAST MRA HEAD WITHOUT CONTRAST TECHNIQUE: Multiplanar, multiecho pulse sequences of the brain and surrounding structures were obtained without intravenous contrast. Angiographic images of the head were obtained using MRA technique without contrast. COMPARISON:  CT head without contrast 03/15/2018 FINDINGS: MRI HEAD FINDINGS Brain: The diffusion weighted images demonstrate an acute nonhemorrhagic infarct involving the posterior left insular cortex and sylvian fissure. The infarct involves the cortex of the left superior temporal gyrus. T2 signal changes are associated with the area of acute infarction. No acute hemorrhage or mass lesion is present. Moderate generalized atrophy is present. Mild white matter changes are within normal  limits for age. The internal auditory canals are normal. Brainstem and cerebellum are within normal limits. Vascular: Abnormal signal of the left internal carotid artery suggests occlusion. Flow is present in both vertebral arteries and the basilar artery. Flow is present in right internal carotid artery. Skull and upper cervical spine: The skull base is within normal limits. Midline sagittal structures are unremarkable. The upper cervical spine is within normal limits. Sinuses/Orbits: Mild mucosal thickening is present in the right maxillary sinus and bilateral ethmoid air cells. The remaining paranasal sinuses and mastoid air cells are clear. Left lens replacement is present. Globes and orbits are otherwise within normal limits. MRA HEAD FINDINGS Pathologic MR angiogram confirms occlusion of the left internal carotid artery. No left M1 segment or MCA branch vessels are visualized. The  right M1 and A1 segments are within normal limits. Both A2 segments demonstrate flow. There is significant attenuation of MCA branch vessels on the right. The vertebral arteries are codominant. The right PICA origin is visualized and normal. The vertebrobasilar junction is normal. The basilar artery is within normal limits. Both posterior cerebral arteries originate from the basilar tip. There are severe stenoses of the proximal P2 segments bilaterally. IMPRESSION: 1. Acute/subacute left posterior insular cortex and superior temporal gyrus cortex nonhemorrhagic infarcts. 2. Occluded left internal carotid artery without significant reconstitution left M1 segment or MCA branch vessels. 3. Significant attenuation of right MCA branch vessels without a right M1 segment occlusion or stenosis. 4. Moderate to severe proximal P2 segment stenoses bilaterally. 5. Moderate atrophy and mild white matter disease is otherwise within normal limits for age. These results were called by telephone at the time of interpretation on 03/15/2018 at 11:25 am  to Dr. Merlyn Lot , who verbally acknowledged these results. Electronically Signed   By: San Morelle M.D.   On: 03/15/2018 11:28   Mr Brain Wo Contrast  Result Date: 03/15/2018 CLINICAL DATA:  Focal neuro deficit of greater than 6 hours. Stroke suspected. Acute mental status changes and disorientation. EXAM: MRI HEAD WITHOUT CONTRAST MRA HEAD WITHOUT CONTRAST TECHNIQUE: Multiplanar, multiecho pulse sequences of the brain and surrounding structures were obtained without intravenous contrast. Angiographic images of the head were obtained using MRA technique without contrast. COMPARISON:  CT head without contrast 03/15/2018 FINDINGS: MRI HEAD FINDINGS Brain: The diffusion weighted images demonstrate an acute nonhemorrhagic infarct involving the posterior left insular cortex and sylvian fissure. The infarct involves the cortex of the left superior temporal gyrus. T2 signal changes are associated with the area of acute infarction. No acute hemorrhage or mass lesion is present. Moderate generalized atrophy is present. Mild white matter changes are within normal limits for age. The internal auditory canals are normal. Brainstem and cerebellum are within normal limits. Vascular: Abnormal signal of the left internal carotid artery suggests occlusion. Flow is present in both vertebral arteries and the basilar artery. Flow is present in right internal carotid artery. Skull and upper cervical spine: The skull base is within normal limits. Midline sagittal structures are unremarkable. The upper cervical spine is within normal limits. Sinuses/Orbits: Mild mucosal thickening is present in the right maxillary sinus and bilateral ethmoid air cells. The remaining paranasal sinuses and mastoid air cells are clear. Left lens replacement is present. Globes and orbits are otherwise within normal limits. MRA HEAD FINDINGS Pathologic MR angiogram confirms occlusion of the left internal carotid artery. No left M1 segment  or MCA branch vessels are visualized. The right M1 and A1 segments are within normal limits. Both A2 segments demonstrate flow. There is significant attenuation of MCA branch vessels on the right. The vertebral arteries are codominant. The right PICA origin is visualized and normal. The vertebrobasilar junction is normal. The basilar artery is within normal limits. Both posterior cerebral arteries originate from the basilar tip. There are severe stenoses of the proximal P2 segments bilaterally. IMPRESSION: 1. Acute/subacute left posterior insular cortex and superior temporal gyrus cortex nonhemorrhagic infarcts. 2. Occluded left internal carotid artery without significant reconstitution left M1 segment or MCA branch vessels. 3. Significant attenuation of right MCA branch vessels without a right M1 segment occlusion or stenosis. 4. Moderate to severe proximal P2 segment stenoses bilaterally. 5. Moderate atrophy and mild white matter disease is otherwise within normal limits for age. These results were called by telephone at  the time of interpretation on 03/15/2018 at 11:25 am to Dr. Merlyn Lot , who verbally acknowledged these results. Electronically Signed   By: San Morelle M.D.   On: 03/15/2018 11:28     IMPRESSION AND PLAN:   * Acute/subacute left posterior insular cortex and superior temporal gyrus cortex nonhemorrhagic infarcts. Start aspirin, statin.  Consult neurology.  Neurochecks.  Telemetry monitoring.  Check echocardiogram and carotid Dopplers.  PT/OT/speech consulted. Patient n.p.o. until bedside swallow evaluation.  *Hypertension.  Continue home medications.  *Dementia.  Likely vascular dementia.  Monitor for inpatient delirium.  *Diabetes mellitus.  Sliding scale insulin. All the records are reviewed and case discussed with ED provider. Management plans discussed with the patient, family and they are in agreement.  CODE STATUS: FULL CODE  TOTAL TIME TAKING CARE OF THIS  PATIENT: 40 minutes.   Leia Alf Earlie Schank M.D on 03/15/2018 at 1:23 PM  Between 7am to 6pm - Pager - [phone]  After 6pm go to [email or web address] - password [email or web address] [facility]  Office  [phone]  CC: Primary care physician; Ria Bush, MD  Note: This dictation was prepared with Dragon dictation along with smaller phrase technology. Any transcriptional errors that result from this process are unintentional.

## 2018-03-15 NOTE — ED Notes (Signed)
Pt standing at bedside with this RN to urinate into urinal.

## 2018-03-15 NOTE — ED Provider Notes (Signed)
[facility] Emergency Department Provider Note    First MD Initiated Contact with Patient 03/15/18 [phone]     (approximate)  I have reviewed the triage vital signs and the nursing notes.   HISTORY  Chief Complaint Altered Mental Status  Level V Caveat:  ams - cva vs metabolic vs dementia  HPI Kydan H Steinborn is a 84 y.o. male with a history of mild dementia symptoms presents to the ER with acute worsening of confusion which seems to be some expressive aphasia that started at some point since last night around 10 PM.  He is accompanied by his significant other who lives with him.  States that she did feel that he was otherwise at his baseline last night woke up this morning was very confused having difficulty communicating but was fully dressed and able to walk about the house.  She noticed that something was abnormal because he did not take the sheets off of his bed which they always do on Saturday morning and when she asked him to go get that he was confused and was unable to do so.  Patient recently was changed from Aricept to Namenda 5 days ago and they are worried that might be causing some of his symptoms.  Past Medical History:  Diagnosis Date  . Arthritis of knee   . BPH (benign prostatic hypertrophy)    w/ nocturia  . Coronary artery disease 03/2014   Inferior ST elevation myocardial infarction. Cardiac catheterization showed an occluded mid RCA. He had an angioplasty and drug-eluting stent placement with a 3.0 x 16 mm Promus drug-eluting stent. Ejection fraction was 45% by echo, completed cardiac rehab 06/2014  . Diabetes type 2, controlled ([place])   . History of basal cell cancer    s/p mohs  . History of colon polyps   . History of hypertension   . HLD (hyperlipidemia)    diet controlled in past  . Hypertension   . Insomnia    treated with multiple meds in past  . MI (myocardial infarction) ([place])   . Rosacea   . Skin cancer   . UTI (urinary  tract infection)    Family History  Problem Relation Age of Onset  . Cancer Father 40       prostate and colon  . Cancer Mother        ovarian or uterine  . CAD Brother 22       MI, smoker  . Parkinson's disease Brother   . COPD Brother   . CAD Brother        MI  . Stroke Sister    Past Surgical History:  Procedure Laterality Date  . CARDIAC CATHETERIZATION  03/2014   [facility];x1 stent  . CATARACT EXTRACTION W/PHACO Left 10/09/2016   Procedure: CATARACT EXTRACTION PHACO AND INTRAOCULAR LENS PLACEMENT (IOC);  Surgeon: Birder Robson, MD;  Location: [facility] ORS;  Service: Ophthalmology;  Laterality: Left;  [place] 1.13 AP% 18.3 CDE 13.45 Fluid pack lot # [id number]  . COLONOSCOPY  08/2010   hyperplastic polyp, rec rpt 5 yrs  . ESOPHAGOGASTRODUODENOSCOPY  05/2015   dilated stricture, normal biopsies, HH, no definite infection [name] @ [facility])  . MOHS SURGERY     basal cell chin/back  . REPLACEMENT TOTAL KNEE Left 08/2006  . VASECTOMY  1970   Patient Active Problem List   Diagnosis Date Noted  . Systolic murmur 73/22/0254  . Complex sleep apnea syndrome 11/23/2017  . PLMD (periodic limb movement disorder) 11/23/2017  .  MCI (mild cognitive impairment) with memory loss 12/17/2016  . Paresthesias 12/12/2015  . Anemia, unspecified 12/12/2015  . Hiatal hernia 05/23/2015  . Pulmonary interstitial fibrosis ([place]) 02/15/2015  . Hepatic lesion 02/15/2015  . Dysphagia 01/07/2015  . Advanced care planning/counseling discussion 11/29/2014  . Constipation 03/31/2014  . Coronary artery disease 03/12/2014  . Medicare annual wellness visit, subsequent 11/27/2013  . HLD (hyperlipidemia) 11/27/2013  . Insomnia   . Diabetes type 2, controlled ([place])   . Benign prostatic hyperplasia   . Rosacea   . History of colon polyps       Prior to Admission medications   Medication Sig Start Date End Date Taking? Authorizing Provider  aspirin 81 MG EC tablet TAKE 1 TABLET (81 MG TOTAL) BY MOUTH DAILY.  11/29/15  Yes Ria Bush, MD  doxazosin (CARDURA) 1 MG tablet TAKE 1 TABLET BY MOUTH EVERY DAY 01/14/18  Yes Ria Bush, MD  eszopiclone (LUNESTA) 2 MG TABS tablet Take 1 tablet (2 mg total) by mouth at bedtime as needed for sleep. Take immediately before bedtime 12/20/17  Yes Dohmeier, Asencion Partridge, MD  finasteride (PROSCAR) 5 MG tablet TAKE 1 TABLET BY MOUTH EVERY DAY 02/13/18  Yes Ria Bush, MD  memantine (NAMENDA) 5 MG tablet Take 1 tablet (5 mg total) by mouth daily. 03/08/18  Yes Ria Bush, MD  metFORMIN (GLUCOPHAGE) 500 MG tablet TAKE 1 TABLET (500 MG TOTAL) BY MOUTH 2 (TWO) TIMES DAILY WITH A MEAL. 01/20/18  Yes Ria Bush, MD  metoprolol succinate (TOPROL-XL) 25 MG 24 hr tablet TAKE 1 TABLET BY MOUTH EVERY DAY 02/13/18  Yes Wellington Hampshire, MD  Multiple Vitamin (MULTIVITAMIN WITH MINERALS) TABS tablet Take 1 tablet by mouth daily.   Yes [provider]  naproxen sodium (ANAPROX) 220 MG tablet Take 220 mg by mouth daily as needed (pain).   Yes [provider]  omeprazole (PRILOSEC) 20 MG capsule TAKE 1 CAPSULE BY MOUTH EVERY DAY 02/11/18  Yes Ria Bush, MD  pravastatin (PRAVACHOL) 40 MG tablet TAKE 1 TABLET (40 MG TOTAL) BY MOUTH DAILY. 02/25/18  Yes Ria Bush, MD  rOPINIRole (REQUIP) 0.25 MG tablet Take 1 tablet (0.25 mg total) by mouth at bedtime. 02/05/18  Yes Dohmeier, Asencion Partridge, MD  vitamin B-12 (CYANOCOBALAMIN) 500 MCG tablet Take 500 mcg by mouth daily.   Yes [provider]    Allergies Ambien [zolpidem tartrate]; Aricept [donepezil hcl]; Atorvastatin; Belsomra [suvorexant]; Penicillins; Tamsulosin; and Doxycycline    Social History Social History   Tobacco Use  . Smoking status: Former Smoker    Last attempt to quit: 11/12/1962    Years since quitting: 55.3  . Smokeless tobacco: Never Used  . Tobacco comment: Quit 1964  Substance Use Topics  . Alcohol use: Yes    Alcohol/week: 0.6 oz    Types: 1 Cans of beer per  week  . Drug use: No    Review of Systems Patient denies headaches, rhinorrhea, blurry vision, numbness, shortness of breath, chest pain, edema, cough, abdominal pain, nausea, vomiting, diarrhea, dysuria, fevers, rashes or hallucinations unless otherwise stated above in HPI. ____________________________________________   PHYSICAL EXAM:  VITAL SIGNS: Vitals:   03/15/18 1000 03/15/18 1130  BP: 115/60 133/74  Pulse: (!) 59 (!) 55  Resp: 18 15  Temp:    SpO2: 96% 98%    Constitutional: Alert disorient x 2 oriented.  in no acute distress. Eyes: Conjunctivae are normal.  Head: Atraumatic. Nose: No congestion/rhinnorhea. Mouth/Throat: Mucous membranes are moist.   Neck: No  stridor. Painless ROM.  Cardiovascular: Normal rate, regular rhythm. Grossly normal heart sounds.  Good peripheral circulation. Respiratory: Normal respiratory effort.  No retractions. Lungs CTAB. Gastrointestinal: Soft and nontender. No distention. No abdominal bruits. No CVA tenderness. Genitourinary: deferred Musculoskeletal: No lower extremity tenderness nor edema.  No joint effusions. Neurologic:  Unable to follow to part commands and has difficulty with one step simple commands, no drift, strength equal BUE and BLE, no facial droop appreciated Skin:  Skin is warm, dry and intact. No rash noted. Psychiatric: Mood and affect are normal. Speech and behavior are normal.  ____________________________________________   LABS (all labs ordered are listed, but only abnormal results are displayed)  Results for orders placed or performed during the hospital encounter of 03/15/18 (from the past 24 hour(s))  Comprehensive metabolic panel     Status: Abnormal   Collection Time: 03/15/18  8:53 AM  Result Value Ref Range   Sodium 135 135 - 145 mmol/L   Potassium 4.2 3.5 - 5.1 mmol/L   Chloride 104 101 - 111 mmol/L   CO2 23 22 - 32 mmol/L   Glucose, Bld 196 (H) 65 - 99 mg/dL   BUN 14 6 - 20 mg/dL   Creatinine, Ser  1.12 0.61 - 1.24 mg/dL   Calcium 8.7 (L) 8.9 - 10.3 mg/dL   Total Protein 7.0 6.5 - 8.1 g/dL   Albumin 3.7 3.5 - 5.0 g/dL   AST 30 15 - 41 U/L   ALT 14 (L) 17 - 63 U/L   Alkaline Phosphatase 55 38 - 126 U/L   Total Bilirubin 0.6 0.3 - 1.2 mg/dL   GFR calc non Af Amer 57 (L) >60 mL/min   GFR calc Af Amer >60 >60 mL/min   Anion gap 8 5 - 15  CBC     Status: Abnormal   Collection Time: 03/15/18  8:53 AM  Result Value Ref Range   WBC 7.2 3.8 - 10.6 K/uL   RBC 4.21 (L) 4.40 - 5.90 MIL/uL   Hemoglobin 12.4 (L) 13.0 - 18.0 g/dL   HCT 37.9 (L) 40.0 - 52.0 %   MCV 90.0 80.0 - 100.0 fL   MCH 29.5 26.0 - 34.0 pg   MCHC 32.7 32.0 - 36.0 g/dL   RDW 14.7 (H) 11.5 - 14.5 %   Platelets 297 150 - 440 K/uL  Differential     Status: None   Collection Time: 03/15/18  8:53 AM  Result Value Ref Range   Neutrophils Relative % 65 %   Neutro Abs 4.7 1.4 - 6.5 K/uL   Lymphocytes Relative 23 %   Lymphs Abs 1.6 1.0 - 3.6 K/uL   Monocytes Relative 8 %   Monocytes Absolute 0.6 0.2 - 1.0 K/uL   Eosinophils Relative 3 %   Eosinophils Absolute 0.2 0 - 0.7 K/uL   Basophils Relative 1 %   Basophils Absolute 0.1 0 - 0.1 K/uL  Troponin I     Status: None   Collection Time: 03/15/18  8:53 AM  Result Value Ref Range   Troponin I <0.03 <0.03 ng/mL  Urine Drug Screen, Qualitative     Status: None   Collection Time: 03/15/18  9:31 AM  Result Value Ref Range   Tricyclic, Ur Screen NONE DETECTED NONE DETECTED   Amphetamines, Ur Screen NONE DETECTED NONE DETECTED   MDMA (Ecstasy)Ur Screen NONE DETECTED NONE DETECTED   Cocaine Metabolite,Ur [place] NONE DETECTED NONE DETECTED   Opiate, Ur Screen NONE DETECTED NONE DETECTED  Phencyclidine (PCP) Ur S NONE DETECTED NONE DETECTED   Cannabinoid 50 Ng, Ur [place] NONE DETECTED NONE DETECTED   Barbiturates, Ur Screen NONE DETECTED NONE DETECTED   Benzodiazepine, Ur Scrn NONE DETECTED NONE DETECTED   Methadone Scn, Ur NONE DETECTED NONE DETECTED  Urinalysis, Routine w reflex  microscopic     Status: Abnormal   Collection Time: 03/15/18  9:31 AM  Result Value Ref Range   Color, Urine YELLOW (A) YELLOW   APPearance CLEAR (A) CLEAR   Specific Gravity, Urine 1.015 1.005 - 1.030   pH 5.0 5.0 - 8.0   Glucose, UA NEGATIVE NEGATIVE mg/dL   Hgb urine dipstick NEGATIVE NEGATIVE   Bilirubin Urine NEGATIVE NEGATIVE   Ketones, ur NEGATIVE NEGATIVE mg/dL   Protein, ur NEGATIVE NEGATIVE mg/dL   Nitrite NEGATIVE NEGATIVE   Leukocytes, UA NEGATIVE NEGATIVE  Ethanol     Status: None   Collection Time: 03/15/18  9:31 AM  Result Value Ref Range   Alcohol, Ethyl (B) <10 <10 mg/dL  Protime-INR     Status: None   Collection Time: 03/15/18  9:31 AM  Result Value Ref Range   Prothrombin Time 13.6 11.4 - 15.2 seconds   INR 1.05   APTT     Status: None   Collection Time: 03/15/18  9:31 AM  Result Value Ref Range   aPTT 33 24 - 36 seconds   ____________________________________________  EKG My review and personal interpretation at Time: 8:41   Indication: confusion  Rate: 70  Rhythm: sinus Axis: normal Other: normal intervals, no stemi ____________________________________________  RADIOLOGY  I personally reviewed all radiographic images ordered to evaluate for the above acute complaints and reviewed radiology reports and findings.  These findings were personally discussed with the patient.  Please see medical record for radiology report.  ____________________________________________   PROCEDURES  Procedure(s) performed:  Procedures    Critical Care performed: no ____________________________________________   INITIAL IMPRESSION / ASSESSMENT AND PLAN / ED COURSE  Pertinent labs & imaging results that were available during my care of the patient were reviewed by me and considered in my medical decision making (see chart for details).  DDX: Dehydration, sepsis, pna, uti, hypoglycemia, cva, drug effect, withdrawal,    Abdoulie H Marchiano is a 84 y.o. who presents  to the ED with encephalopathy as described above.  Last seen normal was last night around 10 PM therefore patient is outside of the window for TPA.  Also certainly concerning for underlying metabolic or even exacerbation of dementia.  CT head ordered to evaluate for head bleed or mass showed no focal acute abnormality.  Blood work sent for the above differential otherwise reassuring therefore MRI will be ordered to evaluate for acute stroke or other anatomic cause of the patient's encephalopathy.  Clinical Course as of Mar 15 1158  Sat Mar 15, 2018  1134 Patient with evidence of left parietal cortical infarct appearing acute and would be consistent with patient's presentation.  Patient will be given aspirin.  He passed a swallow screen.  Patient will require hospitalization for further medical management.   [PR]    Clinical Course User Index [PR] Merlyn Lot, MD     As part of my medical decision making, I reviewed the following data within the [place] notes reviewed and incorporated, Labs reviewed, notes from prior ED visits and [place] Controlled Substance Database   ____________________________________________   FINAL CLINICAL IMPRESSION(S) / ED DIAGNOSES  Final diagnoses:  Cerebrovascular accident (CVA),  unspecified mechanism ([place])      NEW MEDICATIONS STARTED DURING THIS VISIT:  New Prescriptions   No medications on file     Note:  This document was prepared using Dragon voice recognition software and may include unintentional dictation errors.    Merlyn Lot, MD 03/15/18 1159

## 2018-03-15 NOTE — ED Triage Notes (Addendum)
Pt to ED with significant other who states that pt has had sudden onset altered mental status. Pt last seen normal at 10 pm last night before bed. Pt is disoriented x 4 in triage. VS WNL. Pt having hard time following commands. Grips equal bilaterally.   Pt started new medication a few days ago.

## 2018-03-15 NOTE — ED Notes (Signed)
MRI called to talk to significant other to review questions for MRI

## 2018-03-16 ENCOUNTER — Encounter: Payer: Self-pay | Admitting: Family Medicine

## 2018-03-16 ENCOUNTER — Inpatient Hospital Stay (HOSPITAL_COMMUNITY)
Admit: 2018-03-16 | Discharge: 2018-03-16 | Disposition: A | Discharge status: Home / Self Care | Payer: Medicare Other | Attending: Internal Medicine | Admitting: Internal Medicine

## 2018-03-16 DIAGNOSIS — I2510 Atherosclerotic heart disease of native coronary artery without angina pectoris: Secondary | ICD-10-CM

## 2018-03-16 LAB — HEMOGLOBIN A1C
Hgb A1c MFr Bld: 6.6 % — ABNORMAL HIGH (ref 4.8–5.6)
Mean Plasma Glucose: 142.72 mg/dL

## 2018-03-16 LAB — ECHOCARDIOGRAM COMPLETE
Height: 67 in
WEIGHTICAEL: 3078.4 [oz_av]

## 2018-03-16 LAB — LIPID PANEL
CHOL/HDL RATIO: 2.4 ratio
Cholesterol: 126 mg/dL (ref 0–200)
HDL: 53 mg/dL (ref >40)
LDL CALC: 46 mg/dL (ref 0–99)
TRIGLYCERIDES: 135 mg/dL (ref <150)
VLDL: 27 mg/dL (ref 0–40)

## 2018-03-16 LAB — GLUCOSE, CAPILLARY
GLUCOSE-CAPILLARY: 104 mg/dL — AB (ref 65–99)
Glucose-Capillary: 122 mg/dL — ABNORMAL HIGH (ref 65–99)
Glucose-Capillary: 122 mg/dL — ABNORMAL HIGH (ref 65–99)
Glucose-Capillary: 162 mg/dL — ABNORMAL HIGH (ref 65–99)

## 2018-03-16 MED ORDER — CLOPIDOGREL BISULFATE 75 MG PO TABS
75.0000 mg | ORAL_TABLET | Freq: Every day | ORAL | Status: DC
  Administered 2018-03-17: 08:00:00 75 mg via ORAL
  Filled 2018-03-16: qty 1

## 2018-03-16 NOTE — Progress Notes (Signed)
*  PRELIMINARY RESULTS* Echocardiogram 2D Echocardiogram has been performed.  Stephen [name] Stephen [name] Stephen [name] 03/16/2018, 2:42 PM

## 2018-03-16 NOTE — Progress Notes (Signed)
[place] [facility] [place] NAME: Stephen [name]    MR#:  [id number]  DATE OF BIRTH:  07-Jun-1927  SUBJECTIVE:  CHIEF COMPLAINT:   Chief Complaint  Patient presents with  . Altered Mental Status   The patient is confused and demented. REVIEW OF SYSTEMS:  Review of Systems  Unable to perform ROS: Mental status change    DRUG ALLERGIES:   Allergies  Allergen Reactions  . Ambien [Zolpidem Tartrate]     unknown  . Aricept [Donepezil Hcl] Diarrhea  . Atorvastatin     muscle aches  . Belsomra [Suvorexant] Other (See Comments)    anxiety  . Penicillins Hives    Has patient had a PCN reaction causing immediate rash, facial/tongue/throat swelling, SOB or lightheadedness with hypotension: Yes Has patient had a PCN reaction causing severe rash involving mucus membranes or skin necrosis: Yes Has patient had a PCN reaction that required hospitalization No Has patient had a PCN reaction occurring within the last 10 years: No If all of the above answers are "NO", then may proceed with Cephalosporin use.   . Tamsulosin Other (See Comments)    Leg weakness  . Doxycycline Rash   VITALS:  Blood pressure 102/61, pulse 78, temperature 98 F (36.7 C), temperature source Oral, resp. rate 16, height 5\' 7"  (1.702 m), weight 192 lb 6.4 oz (87.3 kg), SpO2 95 %. PHYSICAL EXAMINATION:  Physical Exam  Constitutional: He appears well-nourished. No distress.  HENT:  Head: Normocephalic.  Mouth/Throat: Oropharynx is clear and moist.  Eyes: Pupils are equal, round, and reactive to light. Conjunctivae and EOM are normal. No scleral icterus.  Neck: Normal range of motion. Neck supple. No JVD present. No tracheal deviation present.  Cardiovascular: Normal rate, regular rhythm and normal heart sounds. Exam reveals no gallop.  No murmur heard. Pulmonary/Chest: Effort normal and breath sounds normal. No respiratory distress. He has no wheezes. He has no rales.    Abdominal: Soft. Bowel sounds are normal. He exhibits no distension. There is no tenderness. There is no rebound.  Musculoskeletal: He exhibits no edema or tenderness.  Neurological:  Confused and demented  Skin: No rash noted. No erythema.   LABORATORY PANEL:  Male CBC Recent Labs  Lab 03/15/18 0853  WBC 7.2  HGB 12.4*  HCT 37.9*  PLT 297   ------------------------------------------------------------------------------------------------------------------ Chemistries  Recent Labs  Lab 03/15/18 0853  NA 135  K 4.2  CL 104  CO2 23  GLUCOSE 196*  BUN 14  CREATININE 1.12  CALCIUM 8.7*  AST 30  ALT 14*  ALKPHOS 55  BILITOT 0.6   RADIOLOGY:  US Carotid Bilateral  Result Date: 03/16/2018 CLINICAL DATA:  Acute cerebral infarction. History of hypertension, hyperlipidemia, diabetes and coronary artery disease. EXAM: BILATERAL CAROTID DUPLEX ULTRASOUND TECHNIQUE: Pearline Cables scale imaging, color Doppler and duplex ultrasound were performed of bilateral carotid and vertebral arteries in the neck. COMPARISON:  None. FINDINGS: Criteria: Quantification of carotid stenosis is based on velocity parameters that correlate the residual internal carotid diameter with NASCET-based stenosis levels, using the diameter of the distal internal carotid lumen as the denominator for stenosis measurement. The following velocity measurements were obtained: RIGHT ICA:  245/58 cm/sec CCA:  88/41 cm/sec SYSTOLIC ICA/CCA RATIO:  3.6 ECA:  380 cm/sec LEFT ICA: 22/2 proximal, mid to distal segments occluded in the neck cm/sec CCA:  44/7 cm/sec ECA:  117 cm/sec RIGHT CAROTID ARTERY: There is a large amount of both noncalcified and calcified plaque  beginning at the level of the carotid bulb and extending into internal and external carotid arteries. At the level of maximal velocities, estimated right ICA stenosis is greater than, or equal to, 70%. RIGHT VERTEBRAL ARTERY: Antegrade flow with normal waveform and velocity. LEFT  CAROTID ARTERY: Only the origin of the left ICA is open and demonstrates minimal flow. The mid to distal segments in the neck are completely occluded and show no visible detectable flow. LEFT VERTEBRAL ARTERY: Antegrade flow with normal waveform and velocity. IMPRESSION: 1. Complete occlusion of the left internal carotid artery in the neck. 2. Significant atherosclerosis at the right carotid bifurcation with estimated right ICA stenosis of greater than, or equal to, 70%. Electronically Signed   By: Aletta Edouard M.D.   On: 03/16/2018 08:40   ASSESSMENT AND PLAN:   * Acute/subacute left posterior insular cortex and superior temporal gyrus cortex nonhemorrhagic infarcts. Continue aspirin, statin. Add plavix per Dr. Irish Elders, Consult neurology.  Patient n.p.o. until bedside swallow evaluation. Echo is pending. Carotid duplex:  1. Complete occlusion of the left internal carotid artery in the neck. 2. Significant atherosclerosis at the right carotid bifurcation with estimated right ICA stenosis of greater than, or equal to, 70%.  Acute metabolic encephalopathy due to above. Aspiration fall precaution.  *Hypertension.    Hold Lopressor due to low blood pressure.  *Dementia.  Likely vascular dementia.  Monitor for inpatient delirium.  *Diabetes mellitus.  Sliding scale insulin.  Poor prognosis and palliative care consult.   I discussed with Dr. Irish Elders.  All the records are reviewed and case discussed with Care Management/Social Worker. Management plans discussed with the patient's significant other (POA) and they are in agreement.  CODE STATUS: Full Code  TOTAL TIME TAKING CARE OF THIS PATIENT: 35 minutes.   More than 50% of the time was spent in counseling/coordination of care: YES  POSSIBLE D/C IN 1-2 DAYS, DEPENDING ON CLINICAL CONDITION.   Demetrios Loll M.D on 03/16/2018 at 1:44 PM  Between 7am to 6pm - Pager - [phone]  After 6pm go to [email or web address] - Teacher, early years/pre Hospitalists

## 2018-03-16 NOTE — Progress Notes (Signed)
Advanced Care Plan.  Purpose of Encounter: CODE STATUS and palliative care Parties in Attendance: The patient, his significant other POA and me. Patient's Decisional Capacity: No. Medical Story: Stephen [name]  is a 84 y.o. male with a known history of CAD, hypertension, diabetes, mild cognitive impairment.  She was sent to ED due to confusion, right-sided weakness and facial droop.  MRI of the brain show acute CVA with intracranial arterial stenosis and carotid arterial 100% occlusion on the left side and 70% on the right side.  The patient is still confused.  I discussed with his POA about his current status, poor prognosis, ODE STATUS and palliative care.  His POA hesitated to decide DNR.  She later decided full code for now and will discuss CODE STATUS with the patient's daughter.  She agrees to get palliative care consult. Goals of Care Determinations: Palliative care. Plan:  Code Status: Full code. Time spent discussing advance care planning: 20 minutes.

## 2018-03-16 NOTE — Evaluation (Signed)
Physical Therapy Evaluation Patient Details Name: Stephen [name] MRN: [id number] DOB: 16-Jan-1927 Today's Date: 03/16/2018   History of Present Illness  presented to ER secondary to R facial droop, weakness and AMS; admitted with acute CVA.  MRI significant for acute/subacute infarct to L posterior insular cortex, superior temporal gyrus  Clinical Impression  Upon evaluation, patient alert and oriented to self; follows simple commands, but requires task/return demonstration from therapist. Unable to interpret or follow verbal commands.  Significant receptive > expressive aphasia evident; mod apraxia noted as well.  Physically, bilat UE/LE strength and ROM grossly at least 4+/5 and symmetrical; no obvious sensory, coordination deficit appreciated.  Able to complete bed mobility with mod indep; sit/stand, basic transfers and gait (200') without assist device, cga/close sup.  Mild higher level balance deficits evident (related to decreased R post/lateral hip stability in WBing), but able to self-correct with LE step strategy. Would benefit from skilled PT to address above deficits and promote optimal return to PLOF; would benefit from outpatient PT follow up upon discharge.    Follow Up Recommendations Outpatient PT    Equipment Recommendations       Recommendations for Other Services       Precautions / Restrictions Precautions Precautions: Fall Restrictions Weight Bearing Restrictions: No      Mobility  Bed Mobility Overal bed mobility: Modified Independent                Transfers Overall transfer level: Needs assistance   Transfers: Sit to/from Stand Sit to Stand: Supervision            Ambulation/Gait Ambulation/Gait assistance: Supervision Ambulation Distance (Feet): 200 Feet Assistive device: None       General Gait Details: reciprocal stepping pattern; mildly antalgic due to mild R post/lateral hip instability in closed-chain position; mild sway/veering  with spontaneous head turns (but self-corrects with LE step strategy)  Stairs            Wheelchair Mobility    Modified Rankin (Stroke Patients Only)       Balance Overall balance assessment: Needs assistance Sitting-balance support: No upper extremity supported;Feet supported Sitting balance-Leahy Scale: Good     Standing balance support: No upper extremity supported Standing balance-Leahy Scale: Fair                               Pertinent Vitals/Pain Pain Assessment: Faces Faces Pain Scale: No hurt    Home Living Family/patient expects to be discharged to:: Private residence Living Arrangements: Spouse/significant other Available Help at Discharge: Family Type of Home: House Home Access: Level entry     Home Layout: One level Home Equipment: None Additional Comments: Resident of [place]    Prior Function Level of Independence: Independent         Comments: Indep for ADLs, household and community mobility without assist device; active, plays golf approx 1x/week.  Denies fall history.  Significant other at bedside to provide full, accurate history     Hand Dominance   Dominant Hand: Right    Extremity/Trunk Assessment   Upper Extremity Assessment Upper Extremity Assessment: Overall WFL for tasks assessed(grossly 4+/5 and symmetrical; no apparent sensory/coordination deficits)    Lower Extremity Assessment Lower Extremity Assessment: Overall WFL for tasks assessed(strength grossly 4+/5 throughout; no apparent sensory or functional deficits)       Communication   Communication: Receptive difficulties;Expressive difficulties(receptive > expressive aphasia, significant apraxia)  Cognition Arousal/Alertness: Awake/alert Behavior During Therapy: WFL for tasks assessed/performed Overall Cognitive Status: Difficult to assess                                 General Comments: history of dementia  (was taking aricept, recently changed to namenda) with mild cognitive decline      General Comments      Exercises Other Exercises Other Exercises: Unable to follow verbal commands (0%), but understands and follows task demonstration (75-90% accuracy).  Unable to identify items placed ahead of patient upon verbal command, but able to spontaneously verbalize name of item inconsistently (25%).  Significant motor apraxia evident as well-unable to recognize function, use of items in isolation (though improved with spontaneous, automatic use).   Assessment/Plan    PT Assessment Patient needs continued PT services  PT Problem List Decreased strength;Decreased balance;Decreased mobility;Decreased cognition(Impaired communication)       PT Treatment Interventions DME instruction;Gait training;Functional mobility training;Therapeutic activities;Therapeutic exercise;Balance training;Neuromuscular re-education;Cognitive remediation;Patient/family education    PT Goals (Current goals can be found in the Care Plan section)  Acute Rehab PT Goals Patient Stated Goal: to return home  PT Goal Formulation: With patient Time For Goal Achievement: 03/30/18 Potential to Achieve Goals: Good    Frequency 7X/week   Barriers to discharge        Co-evaluation               AM-PAC PT "6 Clicks" Daily Activity  Outcome Measure Difficulty turning over in bed (including adjusting bedclothes, sheets and blankets)?: None Difficulty moving from lying on back to sitting on the side of the bed? : None Difficulty sitting down on and standing up from a chair with arms (e.g., wheelchair, bedside commode, etc,.)?: A Little Help needed moving to and from a bed to chair (including a wheelchair)?: A Little Help needed walking in hospital room?: A Little Help needed climbing 3-5 steps with a railing? : A Little 6 Click Score: 20    End of Session Equipment Utilized During Treatment: Gait belt Activity  Tolerance: Patient tolerated treatment well Patient left: in bed;with call bell/phone within reach;with family/visitor present(significant other requesting alarm off while she is present; RN informed/aware) Nurse Communication: Mobility status PT Visit Diagnosis: Difficulty in walking, not elsewhere classified (R26.2);Other symptoms and signs involving the nervous system (P95.093)    Time: 2671-2458 PT Time Calculation (min) (ACUTE ONLY): 26 min   Charges:   PT Evaluation $PT Eval Moderate Complexity: 1 Mod PT Treatments $Neuromuscular Re-education: 8-22 mins   PT G Codes:       Sabrina Arriaga H. Owens Shark, PT, DPT, NCS 03/16/18, 10:39 AM [phone]

## 2018-03-16 NOTE — Consult Note (Signed)
Referring Physician:  Dr. Bridgett Larsson    Chief Complaint: slurry speech   HPI: Stephen [name] is an 84 y.o. male male with a known history of CAD, hypertension, diabetes, mild cognitive impairment lives with his partner of 43 years at [facility] independent living.  At baseline ambulates on his own.  He seems to enjoy golfing once a week and ambulates without assistance. Patient was last seen well at 10 PM prior to going to sleep 2 days ago. Morning of admission he was found to have mild right-sided facial droop right-sided weakness and confusion.  Some slurred speech which is improved.  History obtained from his domestic partner of 19 years.  Stephen [name].    Date last known well: Date: 03/14/2018 Time last known well: Time: 10:00 PM tPA Given: No: not within time frame.   Past Medical History:  Diagnosis Date  . Arthritis of knee   . BPH (benign prostatic hypertrophy)    w/ nocturia  . Coronary artery disease 03/2014   Inferior ST elevation myocardial infarction. Cardiac catheterization showed an occluded mid RCA. He had an angioplasty and drug-eluting stent placement with a 3.0 x 16 mm Promus drug-eluting stent. Ejection fraction was 45% by echo, completed cardiac rehab 06/2014  . Diabetes type 2, controlled ([place])   . History of basal cell cancer    s/p mohs  . History of colon polyps   . History of hypertension   . HLD (hyperlipidemia)    diet controlled in past  . Hypertension   . Insomnia    treated with multiple meds in past  . MI (myocardial infarction) ([place])   . Rosacea   . Skin cancer   . UTI (urinary tract infection)     Past Surgical History:  Procedure Laterality Date  . CARDIAC CATHETERIZATION  03/2014   [facility];x1 stent  . CATARACT EXTRACTION W/PHACO Left 10/09/2016   Procedure: CATARACT EXTRACTION PHACO AND INTRAOCULAR LENS PLACEMENT (IOC);  Surgeon: Birder Robson, MD;  Location: [facility] ORS;  Service: Ophthalmology;  Laterality: Left;  [place] 1.13 AP% 18.3 CDE  13.45 Fluid pack lot # [id number]  . COLONOSCOPY  08/2010   hyperplastic polyp, rec rpt 5 yrs  . ESOPHAGOGASTRODUODENOSCOPY  05/2015   dilated stricture, normal biopsies, HH, no definite infection [name] @ [facility])  . MOHS SURGERY     basal cell chin/back  . REPLACEMENT TOTAL KNEE Left 08/2006  . VASECTOMY  1970    Family History  Problem Relation Age of Onset  . Cancer Father 13       prostate and colon  . Cancer Mother        ovarian or uterine  . CAD Brother 79       MI, smoker  . Parkinson's disease Brother   . COPD Brother   . CAD Brother        MI  . Stroke Sister    Social History:  reports that he quit smoking about 55 years ago. He has never used smokeless tobacco. He reports that he drinks about 0.6 oz of alcohol per week. He reports that he does not use drugs.  Allergies:  Allergies  Allergen Reactions  . Ambien [Zolpidem Tartrate]     unknown  . Aricept [Donepezil Hcl] Diarrhea  . Atorvastatin     muscle aches  . Belsomra [Suvorexant] Other (See Comments)    anxiety  . Penicillins Hives    Has patient had a PCN reaction causing immediate rash, facial/tongue/throat swelling, SOB or  lightheadedness with hypotension: Yes Has patient had a PCN reaction causing severe rash involving mucus membranes or skin necrosis: Yes Has patient had a PCN reaction that required hospitalization No Has patient had a PCN reaction occurring within the last 10 years: No If all of the above answers are "NO", then may proceed with Cephalosporin use.   . Tamsulosin Other (See Comments)    Leg weakness  . Doxycycline Rash    Medications: I have reviewed the patient's current medications.  ROS: Unable to obtain due confusion and possibly aphasia.   Physical Examination: Blood pressure 102/61, pulse 78, temperature 98 F (36.7 C), temperature source Oral, resp. rate 16, height 5\' 7"  (1.702 m), weight 192 lb 6.4 oz (87.3 kg), SpO2 95 %.   Neurological Examination   Mental  Status: Awake, mimic commands. Appears to be aphasic.  Cranial Nerves: II: Discs flat bilaterally; Visual fields grossly normal, pupils equal, round, reactive to light and accommodation III,IV, VI: ptosis not present, extra-ocular motions intact bilaterally V,VII: FAcial droop R VIII: hearing normal bilaterally IX,X: gag reflex present XI: bilateral shoulder shrug XII: midline tongue extension Motor: Right : Upper extremity   5/5    Left:     Upper extremity   5/5  Lower extremity   5/5     Lower extremity   5/5 Tone and bulk:normal tone throughout; no atrophy noted Sensory: Pinprick and light touch intact throughout, bilaterally Deep Tendon Reflexes: 1+ and symmetric throughout Plantars: Right: downgoing   Left: downgoing Cerebellar: normal finger-to-nose Gait: not tested       Laboratory Studies:  Basic Metabolic Panel: Recent Labs  Lab 03/15/18 0853  NA 135  K 4.2  CL 104  CO2 23  GLUCOSE 196*  BUN 14  CREATININE 1.12  CALCIUM 8.7*    Liver Function Tests: Recent Labs  Lab 03/15/18 0853  AST 30  ALT 14*  ALKPHOS 55  BILITOT 0.6  PROT 7.0  ALBUMIN 3.7   No results for input(s): LIPASE, AMYLASE in the last 168 hours. No results for input(s): AMMONIA in the last 168 hours.  CBC: Recent Labs  Lab 03/15/18 0853  WBC 7.2  NEUTROABS 4.7  HGB 12.4*  HCT 37.9*  MCV 90.0  PLT 297    Cardiac Enzymes: Recent Labs  Lab 03/15/18 0853  TROPONINI <0.03    BNP: Invalid input(s): POCBNP  CBG: Recent Labs  Lab 03/15/18 2047 03/16/18 0802 03/16/18 1157  GLUCAP 107* 104* 122*    Microbiology: Results for orders placed or performed in visit on 12/02/14  Fecal occult blood, imunochemical     Status: None   Collection Time: 12/02/14  4:31 PM  Result Value Ref Range Status   Fecal Occult Bld Negative Negative Final    Coagulation Studies: Recent Labs    03/15/18 0931  LABPROT 13.6  INR 1.05    Urinalysis:  Recent Labs  Lab  03/15/18 0931  COLORURINE YELLOW*  LABSPEC 1.015  PHURINE 5.0  GLUCOSEU NEGATIVE  HGBUR NEGATIVE  BILIRUBINUR NEGATIVE  KETONESUR NEGATIVE  PROTEINUR NEGATIVE  NITRITE NEGATIVE  LEUKOCYTESUR NEGATIVE    Lipid Panel:    Component Value Date/Time   CHOL 126 03/16/2018 0412   CHOL 124 05/04/2014 0806   TRIG 135 03/16/2018 0412   HDL 53 03/16/2018 0412   HDL 53 05/04/2014 0806   CHOLHDL 2.4 03/16/2018 0412   VLDL 27 03/16/2018 0412   LDLCALC 46 03/16/2018 0412   LDLCALC 48 05/04/2014 0806    HgbA1C:  Lab Results  Component Value Date   HGBA1C 6.9 (H) 12/20/2017    Urine Drug Screen:      Component Value Date/Time   LABOPIA NONE DETECTED 03/15/2018 0931   COCAINSCRNUR NONE DETECTED 03/15/2018 0931   LABBENZ NONE DETECTED 03/15/2018 0931   AMPHETMU NONE DETECTED 03/15/2018 0931   THCU NONE DETECTED 03/15/2018 0931   LABBARB NONE DETECTED 03/15/2018 0931    Alcohol Level:  Recent Labs  Lab 03/15/18 0931  ETH <10    Other results: EKG: normal EKG, normal sinus rhythm, unchanged from previous tracings.  Imaging: Ct Head Wo Contrast  Result Date: 03/15/2018 CLINICAL DATA:  Acute mental status changes, disoriented. EXAM: CT HEAD WITHOUT CONTRAST TECHNIQUE: Contiguous axial images were obtained from the base of the skull through the vertex without intravenous contrast. COMPARISON:  None. FINDINGS: Brain: The brain demonstrates diffuse cortical atrophy as well as mild periventricular small vessel disease. The brain demonstrates no evidence of hemorrhage, infarction, edema, mass effect, extra-axial fluid collection, hydrocephalus or mass lesion. Vascular: No hyperdense vessel or unexpected calcification. Skull: Normal. Negative for fracture or focal lesion. Sinuses/Orbits: No acute finding. Other: None. IMPRESSION: No acute findings. Diffuse cortical atrophy and mild small vessel disease. Electronically Signed   By: Aletta Edouard M.D.   On: 03/15/2018 09:27   Mr  Angiogram Head Wo Contrast  Result Date: 03/15/2018 CLINICAL DATA:  Focal neuro deficit of greater than 6 hours. Stroke suspected. Acute mental status changes and disorientation. EXAM: MRI HEAD WITHOUT CONTRAST MRA HEAD WITHOUT CONTRAST TECHNIQUE: Multiplanar, multiecho pulse sequences of the brain and surrounding structures were obtained without intravenous contrast. Angiographic images of the head were obtained using MRA technique without contrast. COMPARISON:  CT head without contrast 03/15/2018 FINDINGS: MRI HEAD FINDINGS Brain: The diffusion weighted images demonstrate an acute nonhemorrhagic infarct involving the posterior left insular cortex and sylvian fissure. The infarct involves the cortex of the left superior temporal gyrus. T2 signal changes are associated with the area of acute infarction. No acute hemorrhage or mass lesion is present. Moderate generalized atrophy is present. Mild white matter changes are within normal limits for age. The internal auditory canals are normal. Brainstem and cerebellum are within normal limits. Vascular: Abnormal signal of the left internal carotid artery suggests occlusion. Flow is present in both vertebral arteries and the basilar artery. Flow is present in right internal carotid artery. Skull and upper cervical spine: The skull base is within normal limits. Midline sagittal structures are unremarkable. The upper cervical spine is within normal limits. Sinuses/Orbits: Mild mucosal thickening is present in the right maxillary sinus and bilateral ethmoid air cells. The remaining paranasal sinuses and mastoid air cells are clear. Left lens replacement is present. Globes and orbits are otherwise within normal limits. MRA HEAD FINDINGS Pathologic MR angiogram confirms occlusion of the left internal carotid artery. No left M1 segment or MCA branch vessels are visualized. The right M1 and A1 segments are within normal limits. Both A2 segments demonstrate flow. There is  significant attenuation of MCA branch vessels on the right. The vertebral arteries are codominant. The right PICA origin is visualized and normal. The vertebrobasilar junction is normal. The basilar artery is within normal limits. Both posterior cerebral arteries originate from the basilar tip. There are severe stenoses of the proximal P2 segments bilaterally. IMPRESSION: 1. Acute/subacute left posterior insular cortex and superior temporal gyrus cortex nonhemorrhagic infarcts. 2. Occluded left internal carotid artery without significant reconstitution left M1 segment or MCA branch vessels. 3. Significant  attenuation of right MCA branch vessels without a right M1 segment occlusion or stenosis. 4. Moderate to severe proximal P2 segment stenoses bilaterally. 5. Moderate atrophy and mild white matter disease is otherwise within normal limits for age. These results were called by telephone at the time of interpretation on 03/15/2018 at 11:25 am to Dr. Merlyn Lot , who verbally acknowledged these results. Electronically Signed   By: San Morelle M.D.   On: 03/15/2018 11:28   Mr Brain Wo Contrast  Result Date: 03/15/2018 CLINICAL DATA:  Focal neuro deficit of greater than 6 hours. Stroke suspected. Acute mental status changes and disorientation. EXAM: MRI HEAD WITHOUT CONTRAST MRA HEAD WITHOUT CONTRAST TECHNIQUE: Multiplanar, multiecho pulse sequences of the brain and surrounding structures were obtained without intravenous contrast. Angiographic images of the head were obtained using MRA technique without contrast. COMPARISON:  CT head without contrast 03/15/2018 FINDINGS: MRI HEAD FINDINGS Brain: The diffusion weighted images demonstrate an acute nonhemorrhagic infarct involving the posterior left insular cortex and sylvian fissure. The infarct involves the cortex of the left superior temporal gyrus. T2 signal changes are associated with the area of acute infarction. No acute hemorrhage or mass lesion is  present. Moderate generalized atrophy is present. Mild white matter changes are within normal limits for age. The internal auditory canals are normal. Brainstem and cerebellum are within normal limits. Vascular: Abnormal signal of the left internal carotid artery suggests occlusion. Flow is present in both vertebral arteries and the basilar artery. Flow is present in right internal carotid artery. Skull and upper cervical spine: The skull base is within normal limits. Midline sagittal structures are unremarkable. The upper cervical spine is within normal limits. Sinuses/Orbits: Mild mucosal thickening is present in the right maxillary sinus and bilateral ethmoid air cells. The remaining paranasal sinuses and mastoid air cells are clear. Left lens replacement is present. Globes and orbits are otherwise within normal limits. MRA HEAD FINDINGS Pathologic MR angiogram confirms occlusion of the left internal carotid artery. No left M1 segment or MCA branch vessels are visualized. The right M1 and A1 segments are within normal limits. Both A2 segments demonstrate flow. There is significant attenuation of MCA branch vessels on the right. The vertebral arteries are codominant. The right PICA origin is visualized and normal. The vertebrobasilar junction is normal. The basilar artery is within normal limits. Both posterior cerebral arteries originate from the basilar tip. There are severe stenoses of the proximal P2 segments bilaterally. IMPRESSION: 1. Acute/subacute left posterior insular cortex and superior temporal gyrus cortex nonhemorrhagic infarcts. 2. Occluded left internal carotid artery without significant reconstitution left M1 segment or MCA branch vessels. 3. Significant attenuation of right MCA branch vessels without a right M1 segment occlusion or stenosis. 4. Moderate to severe proximal P2 segment stenoses bilaterally. 5. Moderate atrophy and mild white matter disease is otherwise within normal limits for age.  These results were called by telephone at the time of interpretation on 03/15/2018 at 11:25 am to Dr. Merlyn Lot , who verbally acknowledged these results. Electronically Signed   By: San Morelle M.D.   On: 03/15/2018 11:28   US Carotid Bilateral  Result Date: 03/16/2018 CLINICAL DATA:  Acute cerebral infarction. History of hypertension, hyperlipidemia, diabetes and coronary artery disease. EXAM: BILATERAL CAROTID DUPLEX ULTRASOUND TECHNIQUE: Pearline Cables scale imaging, color Doppler and duplex ultrasound were performed of bilateral carotid and vertebral arteries in the neck. COMPARISON:  None. FINDINGS: Criteria: Quantification of carotid stenosis is based on velocity parameters that correlate the residual internal  carotid diameter with NASCET-based stenosis levels, using the diameter of the distal internal carotid lumen as the denominator for stenosis measurement. The following velocity measurements were obtained: RIGHT ICA:  245/58 cm/sec CCA:  33/35 cm/sec SYSTOLIC ICA/CCA RATIO:  3.6 ECA:  380 cm/sec LEFT ICA: 22/2 proximal, mid to distal segments occluded in the neck cm/sec CCA:  44/7 cm/sec ECA:  117 cm/sec RIGHT CAROTID ARTERY: There is a large amount of both noncalcified and calcified plaque beginning at the level of the carotid bulb and extending into internal and external carotid arteries. At the level of maximal velocities, estimated right ICA stenosis is greater than, or equal to, 70%. RIGHT VERTEBRAL ARTERY: Antegrade flow with normal waveform and velocity. LEFT CAROTID ARTERY: Only the origin of the left ICA is open and demonstrates minimal flow. The mid to distal segments in the neck are completely occluded and show no visible detectable flow. LEFT VERTEBRAL ARTERY: Antegrade flow with normal waveform and velocity. IMPRESSION: 1. Complete occlusion of the left internal carotid artery in the neck. 2. Significant atherosclerosis at the right carotid bifurcation with estimated right ICA stenosis  of greater than, or equal to, 70%. Electronically Signed   By: Aletta Edouard M.D.   On: 03/16/2018 08:40    Assessment: 84 y.o. male male with a known history of CAD, hypertension, diabetes, mild cognitive impairment lives with his partner of 40 years at [facility] independent living.  At baseline ambulates on his own.  He seems to enjoy golfing once a week and ambulates without assistance. Patient was last seen well at 10 PM prior to going to sleep 2 days ago. Morning of admission he was found to have mild right-sided facial droop right-sided weakness and confusion.  Some slurred speech which is improved.  History obtained from his domestic partner of 19 years.  Stephen [name].   Stroke Risk Factors - carotid stenosis, diabetes mellitus, family history, hyperlipidemia and hypertension  Plan:  -  CTH no acute abnormalities - MRI brain L posterior insular and temporal gyrus ischemia  - Pt has L occluded carotid  - significant intracranial stenosis including R M1 and b/l P2 - was on ASA 81 mg at home - will add ASA  - no motor strength deficits - delirium and aphasia still persistent.   - needs speech therapy.

## 2018-03-16 NOTE — Plan of Care (Signed)
Pt is confused and impulsive.

## 2018-03-17 DIAGNOSIS — I639 Cerebral infarction, unspecified: Secondary | ICD-10-CM

## 2018-03-17 LAB — GLUCOSE, CAPILLARY
Glucose-Capillary: 113 mg/dL — ABNORMAL HIGH (ref 65–99)
Glucose-Capillary: 130 mg/dL — ABNORMAL HIGH (ref 65–99)

## 2018-03-17 MED ORDER — CLOPIDOGREL BISULFATE 75 MG PO TABS: 75.0000 mg | ORAL_TABLET | Freq: Every day | ORAL | 2 refills | Status: DC

## 2018-03-17 NOTE — Progress Notes (Signed)
Physical Therapy Treatment Patient Details Name: Stephen [name] MRN: [id number] DOB: 1927/03/10 Today's Date: 03/17/2018    History of Present Illness presented to ER secondary to R facial droop, weakness and AMS; admitted with acute CVA.  MRI significant for acute/subacute infarct to L posterior insular cortex, superior temporal gyrus    PT Comments    Continues to rely heavily on gestures and task demonstration for task comprehension.  does appear more alert and interactive today; attempting some spontaneous speech, and appears to have some degrees of awareness of deficit.  Attempted reading/writing, but patient unable to consistently/accurately understand.  Does present with some moderate perservation this date, continued apraxia.    Follow Up Recommendations  Outpatient PT(oupatient SLP)     Equipment Recommendations       Recommendations for Other Services       Precautions / Restrictions Precautions Precautions: Fall Restrictions Weight Bearing Restrictions: No    Mobility  Bed Mobility Overal bed mobility: Modified Independent                Transfers Overall transfer level: Needs assistance   Transfers: Sit to/from Stand Sit to Stand: Supervision            Ambulation/Gait Ambulation/Gait assistance: Supervision Ambulation Distance (Feet): 400 Feet Assistive device: None       General Gait Details: completes head turns, changes of direction without LOB; hand gestures for direction and task management.  Mildly excessive weight shift to L LE throughout, but no overt buckling or LOB   Stairs             Wheelchair Mobility    Modified Rankin (Stroke Patients Only)       Balance Overall balance assessment: Needs assistance Sitting-balance support: No upper extremity supported;Feet supported Sitting balance-Leahy Scale: Good     Standing balance support: Bilateral upper extremity supported Standing balance-Leahy Scale: Fair                               Cognition Arousal/Alertness: Awake/alert Behavior During Therapy: WFL for tasks assessed/performed Overall Cognitive Status: Difficult to assess                                 General Comments: history of dementia (was taking aricept, recently changed to namenda) with mild cognitive decline; suspected global aphasia      Exercises Other Exercises Other Exercises: Continues to rely heavily on gestures and task demonstration for task comprehension.  does appear more alert and interactive today; attempting some spontaneous speech, and appears to have some degrees of awareness of deficit.  Attempted reading/writing, but patient unable to consistently/accurately understand.  Does present with some moderate perservation this date, continued apraxia.    General Comments        Pertinent Vitals/Pain Pain Assessment: No/denies pain Faces Pain Scale: No hurt    Home Living                      Prior Function            PT Goals (current goals can now be found in the care plan section) Acute Rehab PT Goals Patient Stated Goal: to return home  PT Goal Formulation: With patient Time For Goal Achievement: 03/30/18 Potential to Achieve Goals: Good Progress towards PT goals: Progressing toward goals    Frequency  7X/week      PT Plan Current plan remains appropriate    Co-evaluation              AM-PAC PT "6 Clicks" Daily Activity  Outcome Measure  Difficulty turning over in bed (including adjusting bedclothes, sheets and blankets)?: None Difficulty moving from lying on back to sitting on the side of the bed? : None Difficulty sitting down on and standing up from a chair with arms (e.g., wheelchair, bedside commode, etc,.)?: None Help needed moving to and from a bed to chair (including a wheelchair)?: A Little Help needed walking in hospital room?: A Little Help needed climbing 3-5 steps with a railing? : A  Little 6 Click Score: 21    End of Session Equipment Utilized During Treatment: Gait belt Activity Tolerance: Patient tolerated treatment well Patient left: in chair;with family/visitor present(significant other declining alarm while she is present) Nurse Communication: Mobility status PT Visit Diagnosis: Difficulty in walking, not elsewhere classified (R26.2);Other symptoms and signs involving the nervous system (R29.898)     Time: 0865-7846 PT Time Calculation (min) (ACUTE ONLY): 25 min  Charges:  $Gait Training: 8-22 mins $Neuromuscular Re-education: 8-22 mins                    G Codes:       Stephen [name], PT, DPT, NCS 03/17/18, 10:20 AM [phone]

## 2018-03-17 NOTE — Progress Notes (Signed)
Pt is being discharged today, discharge instructions reviewed with him and his significant other. They both verified understanding. IV x1 removed and all belongings packed and returned to him. Pt was given 1 prescription. He was rolled out in a wheelchair by staff.

## 2018-03-17 NOTE — Discharge Summary (Signed)
[facility] NAME: Stephen [name]    MR#:  [id number]  DATE OF BIRTH:  1927/02/05  DATE OF ADMISSION:  03/15/2018   ADMITTING PHYSICIAN: Hillary Bow, MD  DATE OF DISCHARGE: 03/17/2018  3:10 PM  PRIMARY CARE PHYSICIAN: Ria Bush, MD   ADMISSION DIAGNOSIS:  CVA (cerebral vascular accident) [facility]) [I63.9] Cerebrovascular accident (CVA), unspecified mechanism ([place]) [I63.9] DISCHARGE DIAGNOSIS:  Active Problems:   CVA (cerebral vascular accident) ([place])  SECONDARY DIAGNOSIS:   Past Medical History:  Diagnosis Date  . Arthritis of knee   . BPH (benign prostatic hypertrophy)    w/ nocturia  . Coronary artery disease 03/2014   Inferior ST elevation myocardial infarction. Cardiac catheterization showed an occluded mid RCA. He had an angioplasty and drug-eluting stent placement with a 3.0 x 16 mm Promus drug-eluting stent. Ejection fraction was 45% by echo, completed cardiac rehab 06/2014  . Diabetes type 2, controlled ([place])   . History of basal cell cancer    s/p mohs  . History of colon polyps   . History of hypertension   . HLD (hyperlipidemia)    diet controlled in past  . Hypertension   . Insomnia    treated with multiple meds in past  . MI (myocardial infarction) ([place])   . Rosacea   . Skin cancer   . UTI (urinary tract infection)    HOSPITAL COURSE:   *Acute/subacute left posterior insular cortex and superior temporal gyrus cortex nonhemorrhagic infarcts. Continue aspirin, statin. Added plavix per Dr. Irish Elders.  Patient n.p.o. until bedside swallow evaluation. Echo: No cardiac source of emboli was indentified. Carotid duplex:  1. Complete occlusion of the left internal carotid artery in the neck. 2. Significant atherosclerosis at the right carotid bifurcation with estimated right ICA stenosis of greater than, or equal to, 70%. Per Dr. Doy Mince, the patient may need CT angiogram of the head and the vascular  surgery consult. I discussed with the patient's significant other (POA) again about neurologist recommendation.  She does not want further work-up or any procedure or surgery at this time. PT evaluation suggest outpatient PT, OT and speech therapy.  Acute metabolic encephalopathy due to above. Aspiration fall precaution. Improved baseline.  *Hypertension.   Hold Lopressor due to low blood pressure.  *Dementia. Likely vascular dementia.  *Diabetes mellitus. Sliding scale insulin.   I discussed with Dr. Doy Mince. DISCHARGE CONDITIONS:  Stable, discharged to home today.  Outpatient PT, OT and speech therapy. CONSULTS OBTAINED:  Treatment Team:  Leotis Pain, MD Demetrios Loll, MD DRUG ALLERGIES:   Allergies  Allergen Reactions  . Ambien [Zolpidem Tartrate]     unknown  . Aricept [Donepezil Hcl] Diarrhea  . Atorvastatin     muscle aches  . Belsomra [Suvorexant] Other (See Comments)    anxiety  . Penicillins Hives    Has patient had a PCN reaction causing immediate rash, facial/tongue/throat swelling, SOB or lightheadedness with hypotension: Yes Has patient had a PCN reaction causing severe rash involving mucus membranes or skin necrosis: Yes Has patient had a PCN reaction that required hospitalization No Has patient had a PCN reaction occurring within the last 10 years: No If all of the above answers are "NO", then may proceed with Cephalosporin use.   . Tamsulosin Other (See Comments)    Leg weakness  . Doxycycline Rash   DISCHARGE MEDICATIONS:   Allergies as of 03/17/2018      Reactions   Ambien [zolpidem Tartrate]  unknown   Aricept [donepezil Hcl] Diarrhea   Atorvastatin    muscle aches   Belsomra [suvorexant] Other (See Comments)   anxiety   Penicillins Hives   Has patient had a PCN reaction causing immediate rash, facial/tongue/throat swelling, SOB or lightheadedness with hypotension: Yes Has patient had a PCN reaction causing severe rash involving  mucus membranes or skin necrosis: Yes Has patient had a PCN reaction that required hospitalization No Has patient had a PCN reaction occurring within the last 10 years: No If all of the above answers are "NO", then may proceed with Cephalosporin use.   Tamsulosin Other (See Comments)   Leg weakness   Doxycycline Rash      Medication List    STOP taking these medications   metoprolol succinate 25 MG 24 hr tablet Commonly known as:  TOPROL-XL   omeprazole 20 MG capsule Commonly known as:  PRILOSEC     TAKE these medications   aspirin 81 MG EC tablet TAKE 1 TABLET (81 MG TOTAL) BY MOUTH DAILY.   clopidogrel 75 MG tablet Commonly known as:  PLAVIX Take 1 tablet (75 mg total) by mouth daily. Start taking on:  03/18/2018   doxazosin 1 MG tablet Commonly known as:  CARDURA TAKE 1 TABLET BY MOUTH EVERY DAY   eszopiclone 2 MG Tabs tablet Commonly known as:  LUNESTA Take 1 tablet (2 mg total) by mouth at bedtime as needed for sleep. Take immediately before bedtime   finasteride 5 MG tablet Commonly known as:  PROSCAR TAKE 1 TABLET BY MOUTH EVERY DAY   memantine 5 MG tablet Commonly known as:  NAMENDA Take 1 tablet (5 mg total) by mouth daily.   metFORMIN 500 MG tablet Commonly known as:  GLUCOPHAGE TAKE 1 TABLET (500 MG TOTAL) BY MOUTH 2 (TWO) TIMES DAILY WITH A MEAL.   multivitamin with minerals Tabs tablet Take 1 tablet by mouth daily.   naproxen sodium 220 MG tablet Commonly known as:  ALEVE Take 220 mg by mouth daily as needed (pain).   pravastatin 40 MG tablet Commonly known as:  PRAVACHOL TAKE 1 TABLET (40 MG TOTAL) BY MOUTH DAILY.   rOPINIRole 0.25 MG tablet Commonly known as:  REQUIP Take 1 tablet (0.25 mg total) by mouth at bedtime.   vitamin B-12 500 MCG tablet Commonly known as:  CYANOCOBALAMIN Take 500 mcg by mouth daily.        DISCHARGE INSTRUCTIONS:  See AVS.  If you experience worsening of your admission symptoms, develop shortness of  breath, life threatening emergency, suicidal or homicidal thoughts you must seek medical attention immediately by calling 911 or calling your MD immediately  if symptoms less severe.  You Must read complete instructions/literature along with all the possible adverse reactions/side effects for all the Medicines you take and that have been prescribed to you. Take any new Medicines after you have completely understood and accpet all the possible adverse reactions/side effects.   Please note  You were cared for by a hospitalist during your hospital stay. If you have any questions about your discharge medications or the care you received while you were in the hospital after you are discharged, you can call the unit and asked to speak with the hospitalist on call if the hospitalist that took care of you is not available. Once you are discharged, your primary care physician will handle any further medical issues. Please note that NO REFILLS for any discharge medications will be authorized once you are discharged, as  it is imperative that you return to your primary care physician (or establish a relationship with a primary care physician if you do not have one) for your aftercare needs so that they can reassess your need for medications and monitor your lab values.    On the day of Discharge:  VITAL SIGNS:  Blood pressure 108/69, pulse 69, temperature 97.8 F (36.6 C), temperature source Oral, resp. rate (!) 22, height 5\' 7"  (1.702 m), weight 192 lb 6.4 oz (87.3 kg), SpO2 98 %. PHYSICAL EXAMINATION:  GENERAL:  84 y.o.-year-old patient lying in the bed with no acute distress.  EYES: Pupils equal, round, reactive to light and accommodation. No scleral icterus. Extraocular muscles intact.  HEENT: Head atraumatic, normocephalic. Oropharynx and nasopharynx clear.  NECK:  Supple, no jugular venous distention. No thyroid enlargement, no tenderness.  LUNGS: Normal breath sounds bilaterally, no wheezing,  rales,rhonchi or crepitation. No use of accessory muscles of respiration.  CARDIOVASCULAR: S1, S2 normal. No murmurs, rubs, or gallops.  ABDOMEN: Soft, non-tender, non-distended. Bowel sounds present. No organomegaly or mass.  EXTREMITIES: No pedal edema, cyanosis, or clubbing.  NEUROLOGIC: Cranial nerves II through XII are intact. Muscle strength 5/5 in all extremities. Sensation intact. Gait not checked.  PSYCHIATRIC: The patient is alert and oriented x 3.  SKIN: No obvious rash, lesion, or ulcer.  DATA REVIEW:   CBC Recent Labs  Lab 03/15/18 0853  WBC 7.2  HGB 12.4*  HCT 37.9*  PLT 297    Chemistries  Recent Labs  Lab 03/15/18 0853  NA 135  K 4.2  CL 104  CO2 23  GLUCOSE 196*  BUN 14  CREATININE 1.12  CALCIUM 8.7*  AST 30  ALT 14*  ALKPHOS 55  BILITOT 0.6     Microbiology Results  Results for orders placed or performed in visit on 12/02/14  Fecal occult blood, imunochemical     Status: None   Collection Time: 12/02/14  4:31 PM  Result Value Ref Range Status   Fecal Occult Bld Negative Negative Final    RADIOLOGY:  No results found.   Management plans discussed with the patient, his POA and they are in agreement.  CODE STATUS: DNR   TOTAL TIME TAKING CARE OF THIS PATIENT: 38 minutes.    Demetrios Loll M.D on 03/17/2018 at 3:37 PM  Between 7am to 6pm - Pager - [phone]  After 6pm go to [email or web address] - Proofreader  [facility]  Office  [phone]  CC: Primary care physician; Ria Bush, MD   Note: This dictation was prepared with Dragon dictation along with smaller phrase technology. Any transcriptional errors that result from this process are unintentional.

## 2018-03-17 NOTE — Discharge Instructions (Signed)
Heart healthy and ADA diet. Fall precaution. Outpatient PT, OT and speech therapy

## 2018-03-17 NOTE — Evaluation (Signed)
Speech Language Pathology Evaluation Patient Details Name: DEJON LUKAS MRN: [id number] DOB: 02-Dec-1926 Today's Date: 03/17/2018 Time: 1100-1200 SLP Time Calculation (min) (ACUTE ONLY): 60 min  Problem List:  Patient Active Problem List   Diagnosis Date Noted  . CVA (cerebral vascular accident) ([place]) 03/15/2018  . Systolic murmur 91/63/8466  . Complex sleep apnea syndrome 11/23/2017  . PLMD (periodic limb movement disorder) 11/23/2017  . MCI (mild cognitive impairment) with memory loss 12/17/2016  . Paresthesias 12/12/2015  . Anemia, unspecified 12/12/2015  . Hiatal hernia 05/23/2015  . Pulmonary interstitial fibrosis ([place]) 02/15/2015  . Hepatic lesion 02/15/2015  . Dysphagia 01/07/2015  . Advanced care planning/counseling discussion 11/29/2014  . Constipation 03/31/2014  . Coronary artery disease 03/12/2014  . Medicare annual wellness visit, subsequent 11/27/2013  . HLD (hyperlipidemia) 11/27/2013  . Insomnia   . Diabetes type 2, controlled ([place])   . Benign prostatic hyperplasia   . Rosacea   . History of colon polyps    Past Medical History:  Past Medical History:  Diagnosis Date  . Arthritis of knee   . BPH (benign prostatic hypertrophy)    w/ nocturia  . Coronary artery disease 03/2014   Inferior ST elevation myocardial infarction. Cardiac catheterization showed an occluded mid RCA. He had an angioplasty and drug-eluting stent placement with a 3.0 x 16 mm Promus drug-eluting stent. Ejection fraction was 45% by echo, completed cardiac rehab 06/2014  . Diabetes type 2, controlled ([place])   . History of basal cell cancer    s/p mohs  . History of colon polyps   . History of hypertension   . HLD (hyperlipidemia)    diet controlled in past  . Hypertension   . Insomnia    treated with multiple meds in past  . MI (myocardial infarction) ([employer])   . Rosacea   . Skin cancer   . UTI (urinary tract infection)    Past Surgical History:  Past Surgical History:  Procedure  Laterality Date  . CARDIAC CATHETERIZATION  03/2014   [facility];x1 stent  . CATARACT EXTRACTION W/PHACO Left 10/09/2016   Procedure: CATARACT EXTRACTION PHACO AND INTRAOCULAR LENS PLACEMENT (IOC);  Surgeon: Birder Robson, MD;  Location: [facility] ORS;  Service: Ophthalmology;  Laterality: Left;  [place] 1.13 AP% 18.3 CDE 13.45 Fluid pack lot # [id number]  . COLONOSCOPY  08/2010   hyperplastic polyp, rec rpt 5 yrs  . ESOPHAGOGASTRODUODENOSCOPY  05/2015   dilated stricture, normal biopsies, HH, no definite infection [name] @ [facility])  . MOHS SURGERY     basal cell chin/back  . REPLACEMENT TOTAL KNEE Left 08/2006  . VASECTOMY  1970   HPI:  Pt is a 84 y.o. male with a known history of CAD, hypertension, diabetes, mild cognitive impairment lives with his partner, [name], of 53 years at [facility] independent living.  At baseline ambulates on his own.  He seems to enjoy golfing once a week and ambulates on his own.  Patient was last seen well at 10 PM prior to sleeping.  Today morning on waking up patient was found to have mild right-sided facial droop right-sided weakness and confusion. Some slurred speech which is improved.  Per MRI/MRA, an Acute/subacute Left posterior insular cortex and superior temporal gyrus cortex nonhemorrhagic infarcts. Also, Occluded left internal carotid artery without significant reconstitution left M1 segment or MCA branch vessels. There is also significant attenuation of right MCA branch vessels. In addition, Moderate atrophy and mild white matter disease is noted.    Assessment / Plan /  Recommendation Clinical Impression  Pt appears to present w/ both Expressive and Receptive Aphasia w/ Cognitive impairment as well - pt does have Mild Cognitive Impairment at his baseline per chart notes. Most significant is the impact of pt's expressive language deficits on his ability to follow through w/ Receptive language and Cognitive tasks. Pt's Expressive language verbal errors included semantic and  phonemic errors, jargon, and perseveration. Pt was not consistently aware of his speech-language errors and did not always make attempts to repair his errors. He was unable to complete common Automatic speech tasks given verbal/tactile ccues (melodic intonation) and a model (by SLP), or 1-word fill in the blank tasks. Pt was able to recognize basic, functional objects and indicate their function or gesture "no" when a function was demonstrated/described in error. When naming objects, phonemic and semantic cues were noted. He often spontaneously stated "I just don't know" but was able to look toward his partner (of 19 years) and say "that's [name]" when asked; he also approximated his name when asked his name. Pt did appear to recognize headlines from the paper and read 3-4 single words - recommend further assessment of pt's reading skills as this may be a better way to impart information during Cognitive-linguistic tasks during therapy. Recommend pt have f/u Outpatient therapy services to provide ongoing assessment and education of pt's global language deficits and to provide information to family and caregivers on strategies to facilitate ADLs in the home. Recommend further assessment in the area of Apraxia also. There are safety concerns in place d/t pt's Cognitive-linguistic deficits; 100% supervision is recommended. Pt does NOT appear to have any dysphagia; no reports of overt s/s of aspiration noted w/ oral intake.    SLP Assessment  SLP Recommendation/Assessment: All further Speech Lanaguage Pathology  needs can be addressed in the next venue of care SLP Visit Diagnosis: Aphasia (R47.01)    Follow Up Recommendations  Outpatient SLP    Frequency and Duration (TBD Outpt)  (TBD)      SLP Evaluation Cognition  Overall Cognitive Status: Difficult to assess(impaired at baseline per report but functional) Arousal/Alertness: Awake/alert Orientation Level: Oriented to person;Disoriented to  place;Disoriented to time;Disoriented to situation(oriented to [name]/her name) Attention: Focused Focused Attention: Impaired Focused Attention Impairment: Verbal basic;Functional basic Memory: (CNT) Awareness: Impaired Problem Solving: Impaired Problem Solving Impairment: Verbal basic;Functional basic Executive Function: (impaired but also impacted by language deficits) Behaviors: Restless;Perseveration;Poor frustration tolerance(min+) Safety/Judgment: (unsure but suspect impaired) Comments: pt's expressive language deficits significantly impact his ability to follow through w/ Cognitive assessment tasks       Comprehension  Auditory Comprehension Overall Auditory Comprehension: Impaired(could not perform nonverbal tasks ) Yes/No Questions: Impaired Commands: Impaired Conversation: Simple Other Conversation Comments: pt's expressive language deficits significantly impact his ability to follow through w/ Receptive assessment tasks Interfering Components: Attention EffectiveTechniques: Repetition;Stressing words;Visual/Gestural cues;Extra processing time(min aid) Visual Recognition/Discrimination Discrimination: Not tested Reading Comprehension Reading Status: Impaired Word level: Impaired(3-4 words recognized) Sentence Level: Not tested Paragraph Level: Not tested Functional Environmental (signs, name badge): Impaired Interfering Components: Attention Effective Techniques: (slowing down)    Expression Expression Primary Mode of Expression: Verbal Verbal Expression Overall Verbal Expression: Impaired Initiation: Impaired Automatic Speech: Name;Counting;Day of week;Singing((all impaired)) Level of Generative/Spontaneous Verbalization: Word;Phrase(if spontaneous) Repetition: Impaired Level of Impairment: Word level Naming: Impairment Responsive: 0-25% accurate Confrontation: Impaired Convergent: 0-24% accurate Divergent: 0-24% accurate Other Naming Comments: pt's  expressive language deficits significantly impact his ability to follow through w/ Expressive assessment tasks; also a component of Apraxia could  be in play Verbal Errors: Semantic paraphasias;Phonemic paraphasias;Perseveration;Jargon;Not aware of errors((at all times)) Pragmatics: No impairment(none significant noted) Effective Techniques: Semantic cues(aided min) Non-Verbal Means of Communication: Gestures(encouraged pt to use point to or use other gestures) Other Verbal Expression Comments: educated [name] on combining the gesture to initiate the task w/ repetition of the word (action/noun) Written Expression Dominant Hand: Right Written Expression: Not tested   Oral / Motor  Oral Motor/Sensory Function Overall Oral Motor/Sensory Function: Within functional limits(grossly) Motor Speech Overall Motor Speech: (difficult to assess d/t comprehension deficits; auto. wfl) Respiration: Within functional limits Phonation: Normal Resonance: Within functional limits Articulation: Within functional limitis Intelligibility: Intelligible Motor Planning: (CNT) Motor Speech Errors: (not fully aware of errors) Interfering Components: (n/a) Effective Techniques: (CNT)   GO                     Orinda Kenner, MS, CCC-SLP Joanthony Hamza 03/17/2018, 1:51 PM

## 2018-03-17 NOTE — Evaluation (Signed)
Occupational Therapy Evaluation Patient Details Name: Stephen [name] MRN: [id number] DOB: [birth year] Today's Date: 03/17/2018    History of Present Illness 84yo male pt presented to ER secondary to R facial droop, weakness and AMS; admitted with acute CVA.  MRI significant for acute/subacute infarct to L posterior insular cortex, superior temporal gyrus   Clinical Impression   Pt seen for OT evaluation this date. Prior to hospital admission, pt was independent with mobility and ADL, golfing regularly, and living with his significant other at [facility] in independent living. Currently pt demonstrates impairments in communication (expressive and receptive difficulties) and cognition (ideational apraxia) requiring supervision and set up assist with verbal, visual/task demo, and occasional tactile cues to initiate ADL tasks. Pt would benefit from skilled OT to address noted impairments and functional limitations (see below for any additional details) in order to maximize safety and independence while minimizing falls risk and caregiver burden.  Upon hospital discharge, recommend pt discharge to home with OP OT.     Follow Up Recommendations  Outpatient OT    Equipment Recommendations  None recommended by OT    Recommendations for Other Services       Precautions / Restrictions Precautions Precautions: Fall Restrictions Weight Bearing Restrictions: No      Mobility Bed Mobility Overal bed mobility: Modified Independent                Transfers Overall transfer level: Needs assistance   Transfers: Sit to/from Stand Sit to Stand: Supervision              Balance Overall balance assessment: Needs assistance Sitting-balance support: No upper extremity supported;Feet supported Sitting balance-Leahy Scale: Good     Standing balance support: Bilateral upper extremity supported Standing balance-Leahy Scale: Fair                             ADL either  performed or assessed with clinical judgement   ADL Overall ADL's : Needs assistance/impaired Eating/Feeding: Independent;Sitting   Grooming: Sitting;Set up;Supervision/safety;Wash/dry face;Oral care;Cueing for sequencing Grooming Details (indicate cue type and reason): verbal and visual cues to initiate each grooming task with pt able to complete Upper Body Bathing: Sitting;Supervision/ safety   Lower Body Bathing: Sit to/from stand;Min guard;Supervison/ safety Lower Body Bathing Details (indicate cue type and reason): CGA during standing Upper Body Dressing : Sitting;Supervision/safety;Set up   Lower Body Dressing: Sit to/from stand;Min guard Lower Body Dressing Details (indicate cue type and reason): CGA during standing Toilet Transfer: Regular Toilet;Ambulation;Min guard             General ADL Comments: set up and cues to initiate tasks      Vision Baseline Vision/History: Wears glasses Wears Glasses: At all times Patient Visual Report: Other (comment)(pt unable to verbally indicate)       Perception     Praxis      Pertinent Vitals/Pain Pain Assessment: Faces Faces Pain Scale: Hurts a little bit Pain Location: with hand gestures able to communicate headache around temporal region on B sides of head Pain Descriptors / Indicators: Headache Pain Intervention(s): Limited activity within patient's tolerance;Monitored during session     Hand Dominance Right   Extremity/Trunk Assessment Upper Extremity Assessment Upper Extremity Assessment: Overall WFL for tasks assessed(grossly 4+/5, no sensation/coordination or functional deficits noted)   Lower Extremity Assessment Lower Extremity Assessment: (grossly 4+/5 no sensory or functional deficits noted)   Cervical / Trunk Assessment Cervical / Trunk Assessment:  Normal   Communication Communication Communication: Receptive difficulties;Expressive difficulties(receptive > expressive, moderate apraxia)   Cognition  Arousal/Alertness: Awake/alert Behavior During Therapy: WFL for tasks assessed/performed Overall Cognitive Status: Difficult to assess                                 General Comments: history of dementia (was taking aricept, recently changed to namenda) with mild cognitive decline; suspected global aphasia, able to follow simple commands with verbal/visual/tactile cues   General Comments       Exercises    Shoulder Instructions      Home Living Family/patient expects to be discharged to:: Private residence Living Arrangements: Spouse/significant other Available Help at Discharge: Family Type of Home: Independent living facility Home Access: Level entry     Home Layout: One level               Home Equipment: None   Additional Comments: Resident of [place]      Prior Functioning/Environment Level of Independence: Independent        Comments: Indep for ADLs, household and community mobility without assist device; active, plays golf approx 1x/week.  Denies fall history.  Significant other at bedside to confirm full, accurate history        OT Problem List: Decreased cognition;Decreased safety awareness      OT Treatment/Interventions: Self-care/ADL training;Therapeutic exercise;Therapeutic activities;Cognitive remediation/compensation;Patient/family education    OT Goals(Current goals can be found in the care plan section) Acute Rehab OT Goals Patient Stated Goal: to return home  OT Goal Formulation: With patient/family Time For Goal Achievement: 03/31/18 Potential to Achieve Goals: Good ADL Goals Additional ADL Goal #1: Pt will be able to verbally/physically communicate needs to spouse to maximize safety/independence. Additional ADL Goal #2: Pt will perform all dressing and toileting tasks at supervision level with no LOB or safety deficits.  OT Frequency: Min 2X/week   Barriers to D/C:             Co-evaluation              AM-PAC PT "6 Clicks" Daily Activity     Outcome Measure Help from another person eating meals?: None Help from another person taking care of personal grooming?: A Little Help from another person toileting, which includes using toliet, bedpan, or urinal?: A Little Help from another person bathing (including washing, rinsing, drying)?: A Little Help from another person to put on and taking off regular upper body clothing?: None Help from another person to put on and taking off regular lower body clothing?: A Little 6 Click Score: 20   End of Session    Activity Tolerance: Patient tolerated treatment well Patient left: in bed;with call bell/phone within reach;with family/visitor present;Other (comment)(PT in room to assess)  OT Visit Diagnosis: Other abnormalities of gait and mobility (R26.89);Cognitive communication deficit (R41.841) Symptoms and signs involving cognitive functions: Cerebral infarction                Time: 8315-1761 OT Time Calculation (min): 23 min Charges:  OT General Charges $OT Visit: 1 Visit OT Evaluation $OT Eval Low Complexity: 1 Low OT Treatments $Self Care/Home Management : 8-22 mins  Jeni Salles, MPH, MS, OTR/L ascom [phone] 03/17/18, 11:05 AM

## 2018-03-17 NOTE — Care Management Note (Addendum)
Case Management Note  Patient Details  Name: Stephen [name] MRN: [id number] Date of Birth: 24-Jun-1927  Subjective/Objective: Admitted to [facility] with the diagnosis of CVA. Lives with wife at [facility] community. Sees Dr. Danise Mina as primary care physician.                Action/Plan:Physical, occupational and speech therapy are recommending outpatient services.  Discussed with wife at the bedside.  Would like to get these services with staff at [facility] - referral made   Expected Discharge Date:  03/17/18               Expected Discharge Plan:     In-House Referral:     Discharge planning Services     Post Acute Care Choice:    Choice offered to:     DME Arranged:    DME Agency:     HH Arranged:    HH Agency:     Status of Service:     If discussed at [employer], dates discussed:    Additional Comments:  Shelbie Ammons, RN MSN CCM Care Management [phone] 03/17/2018, 10:56 AM

## 2018-03-17 NOTE — Progress Notes (Signed)
Subjective: Patient continues to be aphasic.  All conversations had with significant other.    Objective: Current vital signs: BP 100/68   Pulse 73   Temp 98.6 F (37 C) (Oral)   Resp 16   Ht 5\' 7"  (1.702 m)   Wt 87.3 kg (192 lb 6.4 oz)   SpO2 96%   BMI 30.13 kg/m  Vital signs in last 24 hours: Temp:  [98 F (36.7 C)-98.6 F (37 C)] 98.6 F (37 C) (05/06 0414) Pulse Rate:  [66-73] 73 (05/06 0818) Resp:  [16-20] 16 (05/06 0414) BP: (100-124)/(64-78) 100/68 (05/06 0818) SpO2:  [96 %-98 %] 96 % (05/06 0818)  Intake/Output from previous day: 05/05 0701 - 05/06 0700 In: 440 [P.O.:440] Out: -  Intake/Output this shift: Total I/O In: 240 [P.O.:240] Out: -  Nutritional status:  Diet Order           Diet - low sodium heart healthy        Diet heart healthy/carb modified Room service appropriate? Yes; Fluid consistency: Thin  Diet effective now          Neurologic Exam: Mental Status: Awake and alert.  Both expressive and receptive aphasia noted.  Unable to follow commands.   Cranial Nerves: II: Discs flat bilaterally; Visual fields grossly normal, pupils equal, round, reactive to light and accommodation III,IV, VI: ptosis not present, extra-ocular motions intact bilaterally V,VII: facial droop IX,X: gag reflex present XI: bilateral shoulder shrug XII: unable to test due to aphasia Motor: Lifts all extremities against gravity with no focal weakness noted.     Lab Results: Basic Metabolic Panel: Recent Labs  Lab 03/15/18 0853  NA 135  K 4.2  CL 104  CO2 23  GLUCOSE 196*  BUN 14  CREATININE 1.12  CALCIUM 8.7*    Liver Function Tests: Recent Labs  Lab 03/15/18 0853  AST 30  ALT 14*  ALKPHOS 55  BILITOT 0.6  PROT 7.0  ALBUMIN 3.7   No results for input(s): LIPASE, AMYLASE in the last 168 hours. No results for input(s): AMMONIA in the last 168 hours.  CBC: Recent Labs  Lab 03/15/18 0853  WBC 7.2  NEUTROABS 4.7  HGB 12.4*  HCT 37.9*  MCV  90.0  PLT 297    Cardiac Enzymes: Recent Labs  Lab 03/15/18 0853  TROPONINI <0.03    Lipid Panel: Recent Labs  Lab 03/16/18 0412  CHOL 126  TRIG 135  HDL 53  CHOLHDL 2.4  VLDL 27  LDLCALC 46    CBG: Recent Labs  Lab 03/16/18 1157 03/16/18 1709 03/16/18 2045 03/17/18 0749 03/17/18 1208  GLUCAP 122* 122* 162* 113* 130*    Microbiology: Results for orders placed or performed in visit on 12/02/14  Fecal occult blood, imunochemical     Status: None   Collection Time: 12/02/14  4:31 PM  Result Value Ref Range Status   Fecal Occult Bld Negative Negative Final    Coagulation Studies: Recent Labs    03/15/18 0931  LABPROT 13.6  INR 1.05    Imaging: US Carotid Bilateral  Result Date: 03/16/2018 CLINICAL DATA:  Acute cerebral infarction. History of hypertension, hyperlipidemia, diabetes and coronary artery disease. EXAM: BILATERAL CAROTID DUPLEX ULTRASOUND TECHNIQUE: Pearline Cables scale imaging, color Doppler and duplex ultrasound were performed of bilateral carotid and vertebral arteries in the neck. COMPARISON:  None. FINDINGS: Criteria: Quantification of carotid stenosis is based on velocity parameters that correlate the residual internal carotid diameter with NASCET-based stenosis levels, using the diameter of  the distal internal carotid lumen as the denominator for stenosis measurement. The following velocity measurements were obtained: RIGHT ICA:  245/58 cm/sec CCA:  80/22 cm/sec SYSTOLIC ICA/CCA RATIO:  3.6 ECA:  380 cm/sec LEFT ICA: 22/2 proximal, mid to distal segments occluded in the neck cm/sec CCA:  44/7 cm/sec ECA:  117 cm/sec RIGHT CAROTID ARTERY: There is a large amount of both noncalcified and calcified plaque beginning at the level of the carotid bulb and extending into internal and external carotid arteries. At the level of maximal velocities, estimated right ICA stenosis is greater than, or equal to, 70%. RIGHT VERTEBRAL ARTERY: Antegrade flow with normal waveform  and velocity. LEFT CAROTID ARTERY: Only the origin of the left ICA is open and demonstrates minimal flow. The mid to distal segments in the neck are completely occluded and show no visible detectable flow. LEFT VERTEBRAL ARTERY: Antegrade flow with normal waveform and velocity. IMPRESSION: 1. Complete occlusion of the left internal carotid artery in the neck. 2. Significant atherosclerosis at the right carotid bifurcation with estimated right ICA stenosis of greater than, or equal to, 70%. Electronically Signed   By: Aletta Edouard M.D.   On: 03/16/2018 08:40    Medications:  I have reviewed the patient's current medications. Scheduled: . aspirin EC  81 mg Oral Daily  . clopidogrel  75 mg Oral Daily  . doxazosin  1 mg Oral Daily  . enoxaparin (LOVENOX) injection  40 mg Subcutaneous Daily  . finasteride  5 mg Oral Daily  . insulin aspart  0-5 Units Subcutaneous QHS  . insulin aspart  0-9 Units Subcutaneous TID WC  . memantine  5 mg Oral Daily  . pantoprazole  40 mg Oral Daily  . pravastatin  40 mg Oral Daily  . rOPINIRole  0.25 mg Oral QHS  . vitamin B-12  500 mcg Oral Daily    Assessment/Plan: Patient s/p left temporal infarct with aphasia.  Currently on ASA and Plavix.  Carotid dopplers show 70% RICA stenosis and complete occlusion of LICA.  Echocardiogram shows EF of 55-60% with no cardiac source of emboli noted.  A1c 6.6.  LDL 46.   Patient's prior functioning included driving, caring for all ADL's and playing golf tow times per week.    Recommendations: 1.  Continue ASA and Plavix 2.  Options given to further investigate carotid stenosis and possible occlusion.  Family to discuss further therefore at this time will continue on DUAP.      LOS: 2 days   Alexis Goodell, MD Neurology [phone] 03/17/2018  1:08 PM

## 2018-03-18 ENCOUNTER — Telehealth: Payer: Self-pay | Admitting: *Deleted

## 2018-03-18 NOTE — Telephone Encounter (Signed)
Lm requesting return call to complete TCM and confirm hosp f/u appt  

## 2018-03-19 NOTE — Telephone Encounter (Signed)
Lm requesting return call to complete TCM and confirm hosp f/u appt  

## 2018-03-24 DIAGNOSIS — I69320 Aphasia following cerebral infarction: Secondary | ICD-10-CM | POA: Diagnosis not present

## 2018-03-24 DIAGNOSIS — R278 Other lack of coordination: Secondary | ICD-10-CM | POA: Diagnosis not present

## 2018-03-24 DIAGNOSIS — R2681 Unsteadiness on feet: Secondary | ICD-10-CM | POA: Diagnosis not present

## 2018-03-25 DIAGNOSIS — I69320 Aphasia following cerebral infarction: Secondary | ICD-10-CM | POA: Diagnosis not present

## 2018-03-25 DIAGNOSIS — R278 Other lack of coordination: Secondary | ICD-10-CM | POA: Diagnosis not present

## 2018-03-25 DIAGNOSIS — R2681 Unsteadiness on feet: Secondary | ICD-10-CM | POA: Diagnosis not present

## 2018-03-26 ENCOUNTER — Ambulatory Visit (INDEPENDENT_AMBULATORY_CARE_PROVIDER_SITE_OTHER): Payer: Medicare Other | Admitting: Family Medicine

## 2018-03-26 ENCOUNTER — Encounter: Payer: Self-pay | Admitting: Family Medicine

## 2018-03-26 VITALS — BP 122/68 | HR 67 | Temp 97.9°F | Ht 67.0 in | Wt 187.2 lb

## 2018-03-26 DIAGNOSIS — R011 Cardiac murmur, unspecified: Secondary | ICD-10-CM

## 2018-03-26 DIAGNOSIS — G4731 Primary central sleep apnea: Secondary | ICD-10-CM | POA: Diagnosis not present

## 2018-03-26 DIAGNOSIS — I69320 Aphasia following cerebral infarction: Secondary | ICD-10-CM | POA: Diagnosis not present

## 2018-03-26 DIAGNOSIS — E7800 Pure hypercholesterolemia, unspecified: Secondary | ICD-10-CM | POA: Diagnosis not present

## 2018-03-26 DIAGNOSIS — I639 Cerebral infarction, unspecified: Secondary | ICD-10-CM

## 2018-03-26 DIAGNOSIS — I63239 Cerebral infarction due to unspecified occlusion or stenosis of unspecified carotid arteries: Secondary | ICD-10-CM | POA: Diagnosis not present

## 2018-03-26 DIAGNOSIS — R278 Other lack of coordination: Secondary | ICD-10-CM | POA: Diagnosis not present

## 2018-03-26 DIAGNOSIS — R2681 Unsteadiness on feet: Secondary | ICD-10-CM | POA: Diagnosis not present

## 2018-03-26 DIAGNOSIS — R4701 Aphasia: Secondary | ICD-10-CM

## 2018-03-26 DIAGNOSIS — G3184 Mild cognitive impairment, so stated: Secondary | ICD-10-CM | POA: Diagnosis not present

## 2018-03-26 NOTE — Patient Instructions (Addendum)
Continue current medicines Continue to watch blood pressure - and let us know if starting to creep up to restart metoprolol.  We will refer you to neurology for evaluation of recent stroke/further prevention recommendations Continue working with home therapists.  Keep August appointment.

## 2018-03-26 NOTE — Progress Notes (Addendum)
BP 122/68 (BP Location: Left Arm, Patient Position: Sitting, Cuff Size: Normal)   Pulse 67   Temp 97.9 F (36.6 C) (Oral)   Ht 5\' 7"  (1.702 m)   Wt 187 lb 4 oz (84.9 kg)   SpO2 100%   BMI 29.33 kg/m    CC: hosp f/u visit Subjective:    Patient ID: Stephen [name], male    DOB: [birth year], 84 y.o.   MRN: [id number]  HPI: Stephen [name] is a 84 y.o. male presenting on 03/26/2018 for Hospitalization Follow-up (Admitted to [facility] on 03/15/18, primary dx CVA, unspecified mechanism.  Pt accompanied by son and significant other, [name].)   Recent hospitalization for acute L posterior insular cortex and superior temporal gyrus cortex non hemorrhagic infarcts with resultant acute metabolic encephalopathy and expressive and receptive aphasia. Records reviewed. Aspirin and statin were continued, plavix was added. Echo was unrevealing for cardiac source of embolus. Carotid US showed complete LICA occlusion with >17% RICA stenosis. VVS eval was suggested. Family considering options  [name] notes both speech and cognition has been affected after CVA.   HH PT/OT/ST in place.  Metoprolol was held due to low BP. Omeprazole was stopped   DATE OF ADMISSION:  03/15/2018  DATE OF DISCHARGE: 03/17/2018  TCM hosp f/u phone call not completed.   Discharge dx: acute CVA  Relevant past medical, surgical, family and social history reviewed and updated as indicated. Interim medical history since our last visit reviewed. Allergies and medications reviewed and updated. Outpatient Medications Prior to Visit  Medication Sig Dispense Refill  . aspirin 81 MG EC tablet TAKE 1 TABLET (81 MG TOTAL) BY MOUTH DAILY. 30 tablet 6  . clopidogrel (PLAVIX) 75 MG tablet Take 1 tablet (75 mg total) by mouth daily. 30 tablet 2  . doxazosin (CARDURA) 1 MG tablet TAKE 1 TABLET BY MOUTH EVERY DAY 30 tablet 11  . eszopiclone (LUNESTA) 2 MG TABS tablet Take 1 tablet (2 mg total) by mouth at bedtime as needed for sleep. Take  immediately before bedtime 30 tablet 5  . finasteride (PROSCAR) 5 MG tablet TAKE 1 TABLET BY MOUTH EVERY DAY 90 tablet 1  . memantine (NAMENDA) 5 MG tablet Take 1 tablet (5 mg total) by mouth daily. 30 tablet 3  . metFORMIN (GLUCOPHAGE) 500 MG tablet TAKE 1 TABLET (500 MG TOTAL) BY MOUTH 2 (TWO) TIMES DAILY WITH A MEAL. 180 tablet 1  . Multiple Vitamin (MULTIVITAMIN WITH MINERALS) TABS tablet Take 1 tablet by mouth daily.    . naproxen sodium (ANAPROX) 220 MG tablet Take 220 mg by mouth daily as needed (pain).    . pravastatin (PRAVACHOL) 40 MG tablet TAKE 1 TABLET (40 MG TOTAL) BY MOUTH DAILY. 90 tablet 3  . rOPINIRole (REQUIP) 0.25 MG tablet Take 1 tablet (0.25 mg total) by mouth at bedtime. 90 tablet 2  . vitamin B-12 (CYANOCOBALAMIN) 500 MCG tablet Take 500 mcg by mouth daily.     No facility-administered medications prior to visit.      Per HPI unless specifically indicated in ROS section below Review of Systems     Objective:    BP 122/68 (BP Location: Left Arm, Patient Position: Sitting, Cuff Size: Normal)   Pulse 67   Temp 97.9 F (36.6 C) (Oral)   Ht 5\' 7"  (1.702 m)   Wt 187 lb 4 oz (84.9 kg)   SpO2 100%   BMI 29.33 kg/m   Wt Readings from Last 3 Encounters:  03/26/18 187 lb  4 oz (84.9 kg)  03/15/18 192 lb 6.4 oz (87.3 kg)  02/28/18 192 lb 6.4 oz (87.3 kg)    Physical Exam  Constitutional: He appears well-developed and well-nourished. No distress.  HENT:  Mouth/Throat: Oropharynx is clear and moist. No oropharyngeal exudate.  Neck: Carotid bruit is not present (not appreciated despite known stenosis).  Cardiovascular: Normal rate and regular rhythm.  Murmur (3/6 systolic best at RUSB) heard. Pulmonary/Chest: Effort normal and breath sounds normal. No respiratory distress. He has no wheezes. He has no rales.  Musculoskeletal: He exhibits no edema.  Neurological: He has normal strength. No cranial nerve deficit. He displays a negative Romberg sign. Coordination and  gait normal.  CN 2-12 intact Station and gait intact FTN intact EOMI  Psychiatric: He has a normal mood and affect.   Lab Results  Component Value Date   HGBA1C 6.6 (H) 03/16/2018    Lab Results  Component Value Date   CHOL 126 03/16/2018   HDL 53 03/16/2018   LDLCALC 46 03/16/2018   TRIG 135 03/16/2018   CHOLHDL 2.4 03/16/2018       Assessment & Plan:   Problem List Items Addressed This Visit    Aphasia due to acute stroke ([place])    HHST involved.      Relevant Orders   Ambulatory referral to Neurology   Carotid stenosis, symptomatic, with infarction ([place])    Considering VVS eval. Continue statin, tight BP control.      Relevant Orders   Ambulatory referral to Neurology   Complex sleep apnea syndrome   CVA (cerebral vascular accident) ([place]) - Primary    Suffered acute L posterior insular cortex and superior temporal gyrus cortex, non hemorrhagic infarcts. Carotid US showed complete occlusion on left, ~70% on right. Considering VVS evaluation - reviewed risks/benefits of further intervention (increased perioperative stroke risk after carotid intervention, balanced with possible decreased long term stroke risk). They will continue to consider. Has not been set up with neurology - referral placed today.  Monitor blood pressure closely and if trending up, will restart metoprolol      Relevant Orders   Ambulatory referral to Neurology   HLD (hyperlipidemia)    Continue pravastatin. LDL at goal.       MCI (mild cognitive impairment) with memory loss    Worsened after recent CVA suggesting vascular dementia.  No prior infarcts noted on brain imaging.  Off aricept.       Relevant Orders   Ambulatory referral to Neurology   Systolic murmur    Stable - continue to monitor. Recent echo unrevealing.           No orders of the defined types were placed in this encounter.  Orders Placed This Encounter  Procedures  . Ambulatory referral to Neurology    Referral  Priority:   Routine    Referral Type:   Consultation    Referral Reason:   Specialty Services Required    Requested Specialty:   Neurology    Number of Visits Requested:   1    Follow up plan: No follow-ups on file.  Ria Bush, MD

## 2018-03-27 DIAGNOSIS — I69320 Aphasia following cerebral infarction: Secondary | ICD-10-CM | POA: Diagnosis not present

## 2018-03-27 DIAGNOSIS — R2681 Unsteadiness on feet: Secondary | ICD-10-CM | POA: Diagnosis not present

## 2018-03-27 DIAGNOSIS — R278 Other lack of coordination: Secondary | ICD-10-CM | POA: Diagnosis not present

## 2018-03-28 DIAGNOSIS — R2681 Unsteadiness on feet: Secondary | ICD-10-CM | POA: Diagnosis not present

## 2018-03-28 DIAGNOSIS — R278 Other lack of coordination: Secondary | ICD-10-CM | POA: Diagnosis not present

## 2018-03-28 DIAGNOSIS — I69320 Aphasia following cerebral infarction: Secondary | ICD-10-CM | POA: Diagnosis not present

## 2018-03-30 DIAGNOSIS — R4701 Aphasia: Secondary | ICD-10-CM

## 2018-03-30 DIAGNOSIS — I63239 Cerebral infarction due to unspecified occlusion or stenosis of unspecified carotid arteries: Secondary | ICD-10-CM | POA: Insufficient documentation

## 2018-03-30 DIAGNOSIS — I639 Cerebral infarction, unspecified: Secondary | ICD-10-CM | POA: Insufficient documentation

## 2018-03-30 NOTE — Assessment & Plan Note (Addendum)
Worsened after recent CVA suggesting vascular dementia.  No prior infarcts noted on brain imaging.  Off aricept.

## 2018-03-30 NOTE — Assessment & Plan Note (Signed)
HHST involved.

## 2018-03-30 NOTE — Assessment & Plan Note (Signed)
Continue pravastatin. LDL at goal.

## 2018-03-30 NOTE — Assessment & Plan Note (Signed)
Considering VVS eval. Continue statin, tight BP control.

## 2018-03-30 NOTE — Addendum Note (Signed)
Addended by: Ria Bush on: 03/30/2018 12:18 PM   Modules accepted: Orders

## 2018-03-30 NOTE — Assessment & Plan Note (Signed)
Stable - continue to monitor. Recent echo unrevealing.

## 2018-03-30 NOTE — Assessment & Plan Note (Addendum)
Suffered acute L posterior insular cortex and superior temporal gyrus cortex, non hemorrhagic infarcts. Carotid US showed complete occlusion on left, ~70% on right. Considering VVS evaluation - reviewed risks/benefits of further intervention (increased perioperative stroke risk after carotid intervention, balanced with possible decreased long term stroke risk). They will continue to consider. Has not been set up with neurology - referral placed today.  Monitor blood pressure closely and if trending up, will restart metoprolol

## 2018-04-01 DIAGNOSIS — I69320 Aphasia following cerebral infarction: Secondary | ICD-10-CM | POA: Diagnosis not present

## 2018-04-01 DIAGNOSIS — R278 Other lack of coordination: Secondary | ICD-10-CM | POA: Diagnosis not present

## 2018-04-01 DIAGNOSIS — R2681 Unsteadiness on feet: Secondary | ICD-10-CM | POA: Diagnosis not present

## 2018-04-02 DIAGNOSIS — I69320 Aphasia following cerebral infarction: Secondary | ICD-10-CM | POA: Diagnosis not present

## 2018-04-02 DIAGNOSIS — R278 Other lack of coordination: Secondary | ICD-10-CM | POA: Diagnosis not present

## 2018-04-02 DIAGNOSIS — R2681 Unsteadiness on feet: Secondary | ICD-10-CM | POA: Diagnosis not present

## 2018-04-03 DIAGNOSIS — R278 Other lack of coordination: Secondary | ICD-10-CM | POA: Diagnosis not present

## 2018-04-03 DIAGNOSIS — I69320 Aphasia following cerebral infarction: Secondary | ICD-10-CM | POA: Diagnosis not present

## 2018-04-03 DIAGNOSIS — R2681 Unsteadiness on feet: Secondary | ICD-10-CM | POA: Diagnosis not present

## 2018-04-04 ENCOUNTER — Telehealth: Payer: Self-pay | Admitting: Family Medicine

## 2018-04-04 DIAGNOSIS — Z0279 Encounter for issue of other medical certificate: Secondary | ICD-10-CM

## 2018-04-04 DIAGNOSIS — R278 Other lack of coordination: Secondary | ICD-10-CM | POA: Diagnosis not present

## 2018-04-04 DIAGNOSIS — R2681 Unsteadiness on feet: Secondary | ICD-10-CM | POA: Diagnosis not present

## 2018-04-04 DIAGNOSIS — I69320 Aphasia following cerebral infarction: Secondary | ICD-10-CM | POA: Diagnosis not present

## 2018-04-04 NOTE — Telephone Encounter (Signed)
Attached current med list and placed form in Dr. Synthia Innocent box.

## 2018-04-04 NOTE — Telephone Encounter (Signed)
Spouse dropped off health condition risk form To be filled out In dr g rx tower up front

## 2018-04-05 NOTE — Telephone Encounter (Signed)
Form filled out and in [name]'s box.  Looks like he will need PPD Partner asked about DNR - will need to discuss at next office visit.

## 2018-04-08 ENCOUNTER — Encounter: Payer: Self-pay | Admitting: Family Medicine

## 2018-04-08 DIAGNOSIS — R278 Other lack of coordination: Secondary | ICD-10-CM | POA: Diagnosis not present

## 2018-04-08 DIAGNOSIS — I69320 Aphasia following cerebral infarction: Secondary | ICD-10-CM | POA: Diagnosis not present

## 2018-04-08 DIAGNOSIS — R2681 Unsteadiness on feet: Secondary | ICD-10-CM | POA: Diagnosis not present

## 2018-04-08 DIAGNOSIS — F0150 Vascular dementia without behavioral disturbance: Secondary | ICD-10-CM | POA: Diagnosis not present

## 2018-04-08 DIAGNOSIS — I6523 Occlusion and stenosis of bilateral carotid arteries: Secondary | ICD-10-CM | POA: Diagnosis not present

## 2018-04-08 DIAGNOSIS — F5101 Primary insomnia: Secondary | ICD-10-CM | POA: Diagnosis not present

## 2018-04-08 DIAGNOSIS — I63512 Cerebral infarction due to unspecified occlusion or stenosis of left middle cerebral artery: Secondary | ICD-10-CM | POA: Diagnosis not present

## 2018-04-08 NOTE — Telephone Encounter (Signed)
Left message on vm per dpr informing pt or [name] (on dpr) to schedule a nurse visit for TB skin test (NOT on Thursdays) to complete form.  Also, relayed Dr. Synthia Innocent message that DNR will be discussed at next office visit.

## 2018-04-08 NOTE — Telephone Encounter (Signed)
Attempted to contact pt. No answer. No vm.  Need to schedule nurse visit for TB skin test (NOT on Thursdays) to complete form.  Also, need to inform pt, DNR will be discussed at next office visit.  [Form is in basket on [employer] pending PPD results.]

## 2018-04-08 NOTE — Telephone Encounter (Signed)
Stephen [name] would like a a call back before she picks up this paperwork.  She would ike [name] to call her.  Stephen [name] states she has something she needs added. Stephen [name] states no need to pick up until she speaks with [name].

## 2018-04-09 ENCOUNTER — Encounter: Payer: Self-pay | Admitting: Family Medicine

## 2018-04-10 DIAGNOSIS — R278 Other lack of coordination: Secondary | ICD-10-CM | POA: Diagnosis not present

## 2018-04-10 DIAGNOSIS — R2681 Unsteadiness on feet: Secondary | ICD-10-CM | POA: Diagnosis not present

## 2018-04-10 DIAGNOSIS — I69320 Aphasia following cerebral infarction: Secondary | ICD-10-CM | POA: Diagnosis not present

## 2018-04-11 NOTE — Telephone Encounter (Signed)
Left message on vm per dpr informing pt or [name] (on dpr) to schedule a nurse visit for TB skin test (NOT on Thursdays) to complete form.  Also, relayed Dr. Synthia Innocent message that DNR will be discussed at next office visit.

## 2018-04-15 NOTE — Telephone Encounter (Addendum)
Pt scheduled nurse visit for 04/22/18 at 2:30 PM for TB skin test.  [Form removed from basket on my desk on [employer]

## 2018-04-17 DIAGNOSIS — I69320 Aphasia following cerebral infarction: Secondary | ICD-10-CM | POA: Diagnosis not present

## 2018-04-18 DIAGNOSIS — I69320 Aphasia following cerebral infarction: Secondary | ICD-10-CM | POA: Diagnosis not present

## 2018-04-21 MED ORDER — MEMANTINE HCL 5 MG PO TABS: 5.0000 mg | ORAL_TABLET | Freq: Two times a day (BID) | ORAL | 6 refills | Status: DC

## 2018-04-21 NOTE — Telephone Encounter (Signed)
Forms filled and in [name]'s box. DNR form filled out. I requested a copy of their copy at home.

## 2018-04-22 ENCOUNTER — Ambulatory Visit (INDEPENDENT_AMBULATORY_CARE_PROVIDER_SITE_OTHER): Payer: Medicare Other

## 2018-04-22 DIAGNOSIS — Z111 Encounter for screening for respiratory tuberculosis: Secondary | ICD-10-CM | POA: Diagnosis not present

## 2018-04-22 NOTE — Telephone Encounter (Signed)
Pt provided copy of DNR.  Placed at front office to be scanned into chart.

## 2018-04-23 DIAGNOSIS — I69320 Aphasia following cerebral infarction: Secondary | ICD-10-CM | POA: Diagnosis not present

## 2018-04-24 ENCOUNTER — Encounter: Payer: Self-pay | Admitting: Emergency Medicine

## 2018-04-24 LAB — TB SKIN TEST
Induration: 0 mm
TB SKIN TEST: NEGATIVE

## 2018-04-24 NOTE — Telephone Encounter (Signed)
TB skin test results documented on form.   Made copies to be scanned and for billing. Returned original to pt.

## 2018-04-25 DIAGNOSIS — I69320 Aphasia following cerebral infarction: Secondary | ICD-10-CM | POA: Diagnosis not present

## 2018-04-26 ENCOUNTER — Other Ambulatory Visit: Payer: Self-pay

## 2018-04-26 ENCOUNTER — Emergency Department: Payer: Medicare Other

## 2018-04-26 ENCOUNTER — Observation Stay
Admission: EM | Admit: 2018-04-26 | Discharge: 2018-04-28 | Disposition: A | Discharge status: Home / Self Care | Payer: Medicare Other | Attending: Internal Medicine | Admitting: Internal Medicine

## 2018-04-26 DIAGNOSIS — E119 Type 2 diabetes mellitus without complications: Secondary | ICD-10-CM | POA: Insufficient documentation

## 2018-04-26 DIAGNOSIS — I252 Old myocardial infarction: Secondary | ICD-10-CM | POA: Diagnosis not present

## 2018-04-26 DIAGNOSIS — G4739 Other sleep apnea: Secondary | ICD-10-CM | POA: Diagnosis not present

## 2018-04-26 DIAGNOSIS — G4761 Periodic limb movement disorder: Secondary | ICD-10-CM | POA: Insufficient documentation

## 2018-04-26 DIAGNOSIS — N400 Enlarged prostate without lower urinary tract symptoms: Secondary | ICD-10-CM | POA: Diagnosis not present

## 2018-04-26 DIAGNOSIS — Z881 Allergy status to other antibiotic agents status: Secondary | ICD-10-CM | POA: Insufficient documentation

## 2018-04-26 DIAGNOSIS — Z955 Presence of coronary angioplasty implant and graft: Secondary | ICD-10-CM | POA: Insufficient documentation

## 2018-04-26 DIAGNOSIS — Z7984 Long term (current) use of oral hypoglycemic drugs: Secondary | ICD-10-CM | POA: Insufficient documentation

## 2018-04-26 DIAGNOSIS — Z9842 Cataract extraction status, left eye: Secondary | ICD-10-CM | POA: Insufficient documentation

## 2018-04-26 DIAGNOSIS — I6529 Occlusion and stenosis of unspecified carotid artery: Secondary | ICD-10-CM | POA: Diagnosis not present

## 2018-04-26 DIAGNOSIS — Z86010 Personal history of colonic polyps: Secondary | ICD-10-CM | POA: Insufficient documentation

## 2018-04-26 DIAGNOSIS — F0390 Unspecified dementia without behavioral disturbance: Secondary | ICD-10-CM | POA: Insufficient documentation

## 2018-04-26 DIAGNOSIS — I10 Essential (primary) hypertension: Secondary | ICD-10-CM | POA: Insufficient documentation

## 2018-04-26 DIAGNOSIS — J8410 Pulmonary fibrosis, unspecified: Secondary | ICD-10-CM | POA: Insufficient documentation

## 2018-04-26 DIAGNOSIS — I2510 Atherosclerotic heart disease of native coronary artery without angina pectoris: Secondary | ICD-10-CM | POA: Insufficient documentation

## 2018-04-26 DIAGNOSIS — Z7982 Long term (current) use of aspirin: Secondary | ICD-10-CM | POA: Insufficient documentation

## 2018-04-26 DIAGNOSIS — E785 Hyperlipidemia, unspecified: Secondary | ICD-10-CM | POA: Diagnosis not present

## 2018-04-26 DIAGNOSIS — F419 Anxiety disorder, unspecified: Secondary | ICD-10-CM | POA: Diagnosis not present

## 2018-04-26 DIAGNOSIS — Z7902 Long term (current) use of antithrombotics/antiplatelets: Secondary | ICD-10-CM | POA: Diagnosis not present

## 2018-04-26 DIAGNOSIS — R011 Cardiac murmur, unspecified: Secondary | ICD-10-CM | POA: Insufficient documentation

## 2018-04-26 DIAGNOSIS — F329 Major depressive disorder, single episode, unspecified: Secondary | ICD-10-CM | POA: Diagnosis not present

## 2018-04-26 DIAGNOSIS — R4701 Aphasia: Secondary | ICD-10-CM | POA: Diagnosis not present

## 2018-04-26 DIAGNOSIS — G4700 Insomnia, unspecified: Secondary | ICD-10-CM | POA: Insufficient documentation

## 2018-04-26 DIAGNOSIS — G2581 Restless legs syndrome: Secondary | ICD-10-CM | POA: Diagnosis not present

## 2018-04-26 DIAGNOSIS — Z880 Allergy status to penicillin: Secondary | ICD-10-CM | POA: Insufficient documentation

## 2018-04-26 DIAGNOSIS — K5900 Constipation, unspecified: Secondary | ICD-10-CM | POA: Diagnosis not present

## 2018-04-26 DIAGNOSIS — Z800 Family history of malignant neoplasm of digestive organs: Secondary | ICD-10-CM | POA: Insufficient documentation

## 2018-04-26 DIAGNOSIS — L719 Rosacea, unspecified: Secondary | ICD-10-CM | POA: Diagnosis not present

## 2018-04-26 DIAGNOSIS — Z85828 Personal history of other malignant neoplasm of skin: Secondary | ICD-10-CM | POA: Insufficient documentation

## 2018-04-26 DIAGNOSIS — I69320 Aphasia following cerebral infarction: Secondary | ICD-10-CM | POA: Diagnosis not present

## 2018-04-26 DIAGNOSIS — R079 Chest pain, unspecified: Principal | ICD-10-CM | POA: Diagnosis present

## 2018-04-26 DIAGNOSIS — Z96652 Presence of left artificial knee joint: Secondary | ICD-10-CM | POA: Insufficient documentation

## 2018-04-26 DIAGNOSIS — D649 Anemia, unspecified: Secondary | ICD-10-CM | POA: Insufficient documentation

## 2018-04-26 DIAGNOSIS — K449 Diaphragmatic hernia without obstruction or gangrene: Secondary | ICD-10-CM | POA: Insufficient documentation

## 2018-04-26 DIAGNOSIS — Z79899 Other long term (current) drug therapy: Secondary | ICD-10-CM | POA: Insufficient documentation

## 2018-04-26 DIAGNOSIS — Z820 Family history of epilepsy and other diseases of the nervous system: Secondary | ICD-10-CM | POA: Insufficient documentation

## 2018-04-26 DIAGNOSIS — Z823 Family history of stroke: Secondary | ICD-10-CM | POA: Insufficient documentation

## 2018-04-26 DIAGNOSIS — Z8042 Family history of malignant neoplasm of prostate: Secondary | ICD-10-CM | POA: Insufficient documentation

## 2018-04-26 DIAGNOSIS — Z888 Allergy status to other drugs, medicaments and biological substances status: Secondary | ICD-10-CM | POA: Insufficient documentation

## 2018-04-26 DIAGNOSIS — Z87891 Personal history of nicotine dependence: Secondary | ICD-10-CM | POA: Insufficient documentation

## 2018-04-26 DIAGNOSIS — Z8249 Family history of ischemic heart disease and other diseases of the circulatory system: Secondary | ICD-10-CM | POA: Insufficient documentation

## 2018-04-26 LAB — CBC
HCT: 38.9 % — ABNORMAL LOW (ref 40.0–52.0)
HEMOGLOBIN: 13 g/dL (ref 13.0–18.0)
MCH: 29.6 pg (ref 26.0–34.0)
MCHC: 33.4 g/dL (ref 32.0–36.0)
MCV: 88.5 fL (ref 80.0–100.0)
PLATELETS: 281 10*3/uL (ref 150–440)
RBC: 4.39 MIL/uL — AB (ref 4.40–5.90)
RDW: 14.9 % — ABNORMAL HIGH (ref 11.5–14.5)
WBC: 12.4 10*3/uL — AB (ref 3.8–10.6)

## 2018-04-26 LAB — BASIC METABOLIC PANEL
ANION GAP: 9 (ref 5–15)
BUN: 19 mg/dL (ref 6–20)
CHLORIDE: 102 mmol/L (ref 101–111)
CO2: 25 mmol/L (ref 22–32)
CREATININE: 1.13 mg/dL (ref 0.61–1.24)
Calcium: 9.2 mg/dL (ref 8.9–10.3)
GFR calc non Af Amer: 56 mL/min — ABNORMAL LOW (ref >60)
Glucose, Bld: 156 mg/dL — ABNORMAL HIGH (ref 65–99)
Potassium: 4.1 mmol/L (ref 3.5–5.1)
SODIUM: 136 mmol/L (ref 135–145)

## 2018-04-26 LAB — PROTIME-INR
INR: 1.06
Prothrombin Time: 13.7 seconds (ref 11.4–15.2)

## 2018-04-26 LAB — TROPONIN I

## 2018-04-26 MED ORDER — ASPIRIN 81 MG PO CHEW
243.0000 mg | CHEWABLE_TABLET | Freq: Once | ORAL | Status: AC
  Administered 2018-04-26: 243 mg via ORAL

## 2018-04-26 MED ORDER — ASPIRIN 81 MG PO CHEW
CHEWABLE_TABLET | ORAL | Status: AC
  Administered 2018-04-26: 243 mg via ORAL
  Filled 2018-04-26: qty 3

## 2018-04-26 NOTE — ED Provider Notes (Signed)
[facility] Emergency Department Provider Note       Time seen: ----------------------------------------- 10:45 PM on 04/26/2018 -----------------------------------------   I have reviewed the triage vital signs and the nursing notes.  HISTORY   Chief Complaint Chest Pain    HPI Stephen [name] is a 84 y.o. male with a history of coronary artery disease, diabetes, hypertension and hyperlipidemia who presents to the ED for chest pain.  Patient is a resident of [place].  He was complaining of chest pain, had 2 sublingual nitroglycerin and it was suggested that he take a third.  After the third nitroglycerin his chest pain appeared to have resolved.  Family states he was drooling at some point with low blood pressure during this event.  He has intermittent aphasia from her previous stroke in May of this year.  He denies any complaints at this time.  Past Medical History:  Diagnosis Date  . Arthritis of knee   . BPH (benign prostatic hypertrophy)    w/ nocturia  . Coronary artery disease 03/2014   Inferior ST elevation myocardial infarction. Cardiac catheterization showed an occluded mid RCA. He had an angioplasty and drug-eluting stent placement with a 3.0 x 16 mm Promus drug-eluting stent. Ejection fraction was 45% by echo, completed cardiac rehab 06/2014  . Diabetes type 2, controlled ([place])   . History of basal cell cancer    s/p mohs  . History of colon polyps   . History of hypertension   . HLD (hyperlipidemia)    diet controlled in past  . Hypertension   . Insomnia    treated with multiple meds in past  . MI (myocardial infarction) ([place])   . Rosacea   . Skin cancer   . UTI (urinary tract infection)     Patient Active Problem List   Diagnosis Date Noted  . Carotid stenosis, symptomatic, with infarction ([place]) 03/30/2018  . Aphasia due to acute stroke ([place]) 03/30/2018  . CVA (cerebral vascular accident) ([place]) 03/15/2018  . Systolic murmur  59/56/3875  . Complex sleep apnea syndrome 11/23/2017  . PLMD (periodic limb movement disorder) 11/23/2017  . MCI (mild cognitive impairment) with memory loss 12/17/2016  . Paresthesias 12/12/2015  . Anemia, unspecified 12/12/2015  . Hiatal hernia 05/23/2015  . Pulmonary interstitial fibrosis ([place]) 02/15/2015  . Hepatic lesion 02/15/2015  . Dysphagia 01/07/2015  . Advanced care planning/counseling discussion 11/29/2014  . Constipation 03/31/2014  . Coronary artery disease 03/12/2014  . Medicare annual wellness visit, subsequent 11/27/2013  . HLD (hyperlipidemia) 11/27/2013  . Insomnia   . Diabetes type 2, controlled ([place])   . Benign prostatic hyperplasia   . Rosacea   . History of colon polyps     Past Surgical History:  Procedure Laterality Date  . CARDIAC CATHETERIZATION  03/2014   [facility];x1 stent  . CATARACT EXTRACTION W/PHACO Left 10/09/2016   Procedure: CATARACT EXTRACTION PHACO AND INTRAOCULAR LENS PLACEMENT (IOC);  Surgeon: Birder Robson, MD;  Location: [facility] ORS;  Service: Ophthalmology;  Laterality: Left;  [place] 1.13 AP% 18.3 CDE 13.45 Fluid pack lot # [id number]  . COLONOSCOPY  08/2010   hyperplastic polyp, rec rpt 5 yrs  . ESOPHAGOGASTRODUODENOSCOPY  05/2015   dilated stricture, normal biopsies, HH, no definite infection [name] @ [facility])  . MOHS SURGERY     basal cell chin/back  . REPLACEMENT TOTAL KNEE Left 08/2006  . VASECTOMY  1970    Allergies Ambien [zolpidem tartrate]; Aricept [donepezil hcl]; Atorvastatin; Belsomra [suvorexant]; Penicillins; Tamsulosin; and Doxycycline  Social History Social History   Tobacco Use  . Smoking status: Former Smoker    Last attempt to quit: 11/12/1962    Years since quitting: 55.4  . Smokeless tobacco: Never Used  . Tobacco comment: Quit 1964  Substance Use Topics  . Alcohol use: Yes    Alcohol/week: 0.6 oz    Types: 1 Cans of beer per week  . Drug use: No   Review of Systems Constitutional: Negative for  fever. Cardiovascular: Positive for recent chest pain Respiratory: Negative for shortness of breath. Gastrointestinal: Negative for abdominal pain, vomiting and diarrhea. Musculoskeletal: Negative for back pain. Skin: Negative for rash. Neurological: Negative for headaches, focal weakness or numbness.  All systems negative/normal/unremarkable except as stated in the HPI  ____________________________________________   PHYSICAL EXAM:  VITAL SIGNS: ED Triage Vitals [04/26/18 2227]  Enc Vitals Group     BP 116/70     Pulse Rate (!) 101     Resp (!) 26     Temp 98.1 F (36.7 C)     Temp src      SpO2 96 %     Weight 190 lb (86.2 kg)     Height 5\' 11"  (1.803 m)     Head Circumference      Peak Flow      Pain Score 0     Pain Loc      Pain Edu?      Excl. in [place]?    Constitutional: Alert and mostly oriented.  Well appearing and in no distress. Eyes: Conjunctivae are normal. Normal extraocular movements. ENT   Head: Normocephalic and atraumatic.   Nose: No congestion/rhinnorhea.   Mouth/Throat: Mucous membranes are moist.   Neck: No stridor. Cardiovascular: Normal rate, regular rhythm. No murmurs, rubs, or gallops. Respiratory: Normal respiratory effort without tachypnea nor retractions. Breath sounds are clear and equal bilaterally. No wheezes/rales/rhonchi. Gastrointestinal: Soft and nontender. Normal bowel sounds Musculoskeletal: Nontender with normal range of motion in extremities. No lower extremity tenderness nor edema. Neurologic:  Normal speech and language. No gross focal neurologic deficits are appreciated.  Skin:  Skin is warm, dry and intact. No rash noted. Psychiatric: Mood and affect are normal. Speech and behavior are normal.  ____________________________________________  EKG: Interpreted by me.  Sinus rhythm rate of 98 bpm, normal PR interval, possible old inferior infarct, normal QT.  ____________________________________________  ED  COURSE:  As part of my medical decision making, I reviewed the following data within the [place] History obtained from family if available, nursing notes, old chart and ekg, as well as notes from prior ED visits. Patient presented for chest pain, we will assess with labs and imaging as indicated at this time.   Procedures ____________________________________________   LABS (pertinent positives/negatives)  Labs Reviewed  BASIC METABOLIC PANEL - Abnormal; Notable for the following components:      Result Value   Glucose, Bld 156 (*)    GFR calc non Af Amer 56 (*)    All other components within normal limits  CBC - Abnormal; Notable for the following components:   WBC 12.4 (*)    RBC 4.39 (*)    HCT 38.9 (*)    RDW 14.9 (*)    All other components within normal limits  TROPONIN I  PROTIME-INR    RADIOLOGY  Chest x-ray Is pending at this time ____________________________________________  DIFFERENTIAL DIAGNOSIS   Unstable angina, MI, PE, dissection  FINAL ASSESSMENT AND PLAN  Chest pain  Plan: The patient had presented for chest pain. Patient's labs did not reveal any acute process. Patient's imaging is pending at this time.  Currently he is chest pain-free, I will discuss with the hospitalist for admission and formal cardiac rule out.   Laurence Aly, MD   Note: This note was generated in part or whole with voice recognition software. Voice recognition is usually quite accurate but there are transcription errors that can and very often do occur. I apologize for any typographical errors that were not detected and corrected.     Stephen Newport, MD 04/26/18 [phone]

## 2018-04-26 NOTE — ED Triage Notes (Signed)
Patient presents to Emergency Department via EMS from [facility] with complaints of CHEST PAIN  Pt took 3 nitro and c/o pain in back of head   PT was reported to be aphasic when security arrived to pt's house, att pt's only deficit is smile on right side is higher and tongue deviates to the right  Hx Stroke in may 2019 with inability to  speak

## 2018-04-27 ENCOUNTER — Encounter: Payer: Self-pay | Admitting: Emergency Medicine

## 2018-04-27 DIAGNOSIS — I10 Essential (primary) hypertension: Secondary | ICD-10-CM | POA: Diagnosis not present

## 2018-04-27 DIAGNOSIS — I2510 Atherosclerotic heart disease of native coronary artery without angina pectoris: Secondary | ICD-10-CM | POA: Diagnosis not present

## 2018-04-27 DIAGNOSIS — R0789 Other chest pain: Secondary | ICD-10-CM

## 2018-04-27 DIAGNOSIS — R079 Chest pain, unspecified: Secondary | ICD-10-CM | POA: Diagnosis not present

## 2018-04-27 DIAGNOSIS — E119 Type 2 diabetes mellitus without complications: Secondary | ICD-10-CM | POA: Diagnosis not present

## 2018-04-27 LAB — TROPONIN I
Troponin I: <0.03 ng/mL (ref <0.03)
Troponin I: <0.03 ng/mL (ref <0.03)
Troponin I: <0.03 ng/mL (ref <0.03)

## 2018-04-27 LAB — TSH: TSH: 0.747 u[IU]/mL (ref 0.350–4.500)

## 2018-04-27 LAB — MRSA PCR SCREENING: MRSA by PCR: NEGATIVE

## 2018-04-27 MED ORDER — NITROGLYCERIN 2 % TD OINT
1.0000 [in_us] | TOPICAL_OINTMENT | Freq: Four times a day (QID) | TRANSDERMAL | Status: DC
  Administered 2018-04-27 – 2018-04-28 (×5): 1 [in_us] via TOPICAL
  Filled 2018-04-27 (×5): qty 1

## 2018-04-27 MED ORDER — DOXAZOSIN MESYLATE 1 MG PO TABS
1.0000 mg | ORAL_TABLET | Freq: Every day | ORAL | Status: DC
  Administered 2018-04-28: 1 mg via ORAL
  Filled 2018-04-27 (×2): qty 1

## 2018-04-27 MED ORDER — ONDANSETRON HCL 4 MG/2ML IJ SOLN: 4.0000 mg | Freq: Four times a day (QID) | INTRAMUSCULAR | Status: DC | PRN

## 2018-04-27 MED ORDER — CLONAZEPAM 0.5 MG PO TABS
0.5000 mg | ORAL_TABLET | Freq: Every day | ORAL | Status: DC
  Administered 2018-04-27 (×2): 0.5 mg via ORAL
  Filled 2018-04-27 (×2): qty 1

## 2018-04-27 MED ORDER — MORPHINE SULFATE (PF) 2 MG/ML IV SOLN: 1.0000 mg | INTRAVENOUS | Status: DC | PRN

## 2018-04-27 MED ORDER — NAPROXEN SODIUM 220 MG PO TABS: 220.0000 mg | ORAL_TABLET | Freq: Every day | ORAL | Status: DC | PRN

## 2018-04-27 MED ORDER — ACETAMINOPHEN 325 MG PO TABS
650.0000 mg | ORAL_TABLET | Freq: Four times a day (QID) | ORAL | Status: DC | PRN
  Administered 2018-04-27 (×2): 650 mg via ORAL
  Filled 2018-04-27 (×2): qty 2

## 2018-04-27 MED ORDER — ENOXAPARIN SODIUM 40 MG/0.4ML ~~LOC~~ SOLN
40.0000 mg | SUBCUTANEOUS | Status: DC
  Administered 2018-04-27: 40 mg via SUBCUTANEOUS
  Filled 2018-04-27: qty 0.4

## 2018-04-27 MED ORDER — ROPINIROLE HCL 0.25 MG PO TABS
0.2500 mg | ORAL_TABLET | Freq: Every day | ORAL | Status: DC
  Administered 2018-04-27: 0.25 mg via ORAL
  Filled 2018-04-27 (×3): qty 1

## 2018-04-27 MED ORDER — FINASTERIDE 5 MG PO TABS
5.0000 mg | ORAL_TABLET | Freq: Every day | ORAL | Status: DC
  Administered 2018-04-27 (×2): 5 mg via ORAL
  Filled 2018-04-27 (×2): qty 1

## 2018-04-27 MED ORDER — MEMANTINE HCL 5 MG PO TABS
5.0000 mg | ORAL_TABLET | Freq: Two times a day (BID) | ORAL | Status: DC
  Administered 2018-04-27 – 2018-04-28 (×3): 5 mg via ORAL
  Filled 2018-04-27 (×5): qty 1

## 2018-04-27 MED ORDER — PRAVASTATIN SODIUM 40 MG PO TABS
40.0000 mg | ORAL_TABLET | Freq: Every day | ORAL | Status: DC
  Administered 2018-04-27 – 2018-04-28 (×2): 40 mg via ORAL
  Filled 2018-04-27 (×2): qty 1

## 2018-04-27 MED ORDER — ACETAMINOPHEN 650 MG RE SUPP: 650.0000 mg | Freq: Four times a day (QID) | RECTAL | Status: DC | PRN

## 2018-04-27 MED ORDER — ZOLPIDEM TARTRATE 5 MG PO TABS: 5.0000 mg | ORAL_TABLET | Freq: Every evening | ORAL | Status: DC | PRN

## 2018-04-27 MED ORDER — NITROGLYCERIN 0.4 MG SL SUBL: 0.4000 mg | SUBLINGUAL_TABLET | SUBLINGUAL | Status: DC | PRN

## 2018-04-27 MED ORDER — NAPROXEN 250 MG PO TABS: 250.0000 mg | ORAL_TABLET | Freq: Every day | ORAL | Status: DC | PRN

## 2018-04-27 MED ORDER — VITAMIN B-12 1000 MCG PO TABS
500.0000 ug | ORAL_TABLET | Freq: Every day | ORAL | Status: DC
  Administered 2018-04-27 – 2018-04-28 (×2): 500 ug via ORAL
  Filled 2018-04-27 (×2): qty 1

## 2018-04-27 MED ORDER — DOCUSATE SODIUM 100 MG PO CAPS
100.0000 mg | ORAL_CAPSULE | Freq: Two times a day (BID) | ORAL | Status: DC
  Administered 2018-04-27 – 2018-04-28 (×3): 100 mg via ORAL
  Filled 2018-04-27 (×3): qty 1

## 2018-04-27 MED ORDER — CLOPIDOGREL BISULFATE 75 MG PO TABS
75.0000 mg | ORAL_TABLET | Freq: Every day | ORAL | Status: DC
  Administered 2018-04-27 (×2): 75 mg via ORAL
  Filled 2018-04-27 (×2): qty 1

## 2018-04-27 MED ORDER — ONDANSETRON HCL 4 MG PO TABS: 4.0000 mg | ORAL_TABLET | Freq: Four times a day (QID) | ORAL | Status: DC | PRN

## 2018-04-27 MED ORDER — ASPIRIN EC 81 MG PO TBEC
81.0000 mg | DELAYED_RELEASE_TABLET | ORAL | Status: DC
  Administered 2018-04-27 – 2018-04-28 (×2): 81 mg via ORAL
  Filled 2018-04-27 (×2): qty 1

## 2018-04-27 MED ORDER — ADULT MULTIVITAMIN W/MINERALS CH
1.0000 | ORAL_TABLET | Freq: Every day | ORAL | Status: DC
  Administered 2018-04-27 – 2018-04-28 (×2): 1 via ORAL
  Filled 2018-04-27 (×2): qty 1

## 2018-04-27 NOTE — Consult Note (Signed)
Cardiology Consultation:   Patient ID: Stephen [name]; [id number]; 07/06/1927   Admit date: 04/26/2018 Date of Consult: 04/27/2018  Primary Care Provider: Ria Bush, MD Primary Cardiologist:  Fletcher Anon  Primary Electrophysiologist:     Patient Profile:   Stephen [name] is a 84 y.o. male with a hx of CAD, diabetes mellitus, hypertension and hyperlipidemia.  Who is being seen today for the evaluation of chest pain at the request of  Dr. Benjie Karvonen .  History of Present Illness:   Stephen [name] is an 36 year old gentleman who is followed by Dr. Fletcher Anon.  He has a history of coronary artery disease and had a myocardial infarction in May, 2018.  He had stenting of his mid right coronary artery.  The patient was admitted yesterday after having some chest discomfort.  The patient's significant other also stated that the patient was drooling and was having difficulty speaking.  Troponin levels are negative x2.  His white blood cell count is mildly elevated.  The chest pain started in the center of his chest.  It eventually radiated to the back of his chest.  There is no diaphoresis.  He has had persistent shortness of breath. There is some pleuritic component to the chest pain.  The pain does not get better when he sits forward does not get worse when he lies back.   Past Medical History:  Diagnosis Date  . Arthritis of knee   . BPH (benign prostatic hypertrophy)    w/ nocturia  . Coronary artery disease 03/2014   Inferior ST elevation myocardial infarction. Cardiac catheterization showed an occluded mid RCA. He had an angioplasty and drug-eluting stent placement with a 3.0 x 16 mm Promus drug-eluting stent. Ejection fraction was 45% by echo, completed cardiac rehab 06/2014  . Diabetes type 2, controlled ([place])   . History of basal cell cancer    s/p mohs  . History of colon polyps   . History of hypertension   . HLD (hyperlipidemia)    diet controlled in past  . Hypertension   .  Insomnia    treated with multiple meds in past  . MI (myocardial infarction) ([place])   . Rosacea   . Skin cancer   . UTI (urinary tract infection)     Past Surgical History:  Procedure Laterality Date  . CARDIAC CATHETERIZATION  03/2014   [facility];x1 stent  . CATARACT EXTRACTION W/PHACO Left 10/09/2016   Procedure: CATARACT EXTRACTION PHACO AND INTRAOCULAR LENS PLACEMENT (IOC);  Surgeon: Birder Robson, MD;  Location: [facility] ORS;  Service: Ophthalmology;  Laterality: Left;  [place] 1.13 AP% 18.3 CDE 13.45 Fluid pack lot # [id number]  . COLONOSCOPY  08/2010   hyperplastic polyp, rec rpt 5 yrs  . ESOPHAGOGASTRODUODENOSCOPY  05/2015   dilated stricture, normal biopsies, HH, no definite infection [name] @ [facility])  . MOHS SURGERY     basal cell chin/back  . REPLACEMENT TOTAL KNEE Left 08/2006  . VASECTOMY  1970     Home Medications:  Prior to Admission medications   Medication Sig Start Date End Date Taking? Authorizing Provider  aspirin EC 81 MG tablet Take 81 mg by mouth every morning.   Yes [provider]  clonazePAM (KLONOPIN) 0.5 MG tablet Take 0.5 mg by mouth at bedtime.   Yes [provider]  clopidogrel (PLAVIX) 75 MG tablet Take 1 tablet (75 mg total) by mouth daily. Patient taking differently: Take 75 mg by mouth at bedtime.  03/18/18  Yes Demetrios Loll, MD  doxazosin (  CARDURA) 1 MG tablet TAKE 1 TABLET BY MOUTH EVERY DAY 01/14/18  Yes Ria Bush, MD  eszopiclone (LUNESTA) 2 MG TABS tablet Take 1 tablet (2 mg total) by mouth at bedtime as needed for sleep. Take immediately before bedtime 12/20/17  Yes Dohmeier, Asencion Partridge, MD  finasteride (PROSCAR) 5 MG tablet Take 5 mg by mouth at bedtime.   Yes [provider]  memantine (NAMENDA) 5 MG tablet Take 1 tablet (5 mg total) by mouth 2 (two) times daily. 04/21/18  Yes Ria Bush, MD  metFORMIN (GLUCOPHAGE) 500 MG tablet TAKE 1 TABLET (500 MG TOTAL) BY MOUTH 2 (TWO) TIMES DAILY WITH A MEAL. 01/20/18  Yes Ria Bush, MD  Multiple Vitamin (MULTIVITAMIN WITH MINERALS) TABS tablet Take 1 tablet by mouth daily.   Yes [provider]  naproxen sodium (ANAPROX) 220 MG tablet Take 220 mg by mouth daily as needed (pain).   Yes [provider]  pravastatin (PRAVACHOL) 40 MG tablet TAKE 1 TABLET (40 MG TOTAL) BY MOUTH DAILY. 02/25/18  Yes Ria Bush, MD  rOPINIRole (REQUIP) 0.25 MG tablet Take 1 tablet (0.25 mg total) by mouth at bedtime. 02/05/18  Yes Dohmeier, Asencion Partridge, MD  vitamin B-12 (CYANOCOBALAMIN) 500 MCG tablet Take 500 mcg by mouth daily.   Yes [provider]    Inpatient Medications: Scheduled Meds: . aspirin EC  81 mg Oral [id number]  . clonazePAM  0.5 mg Oral QHS  . clopidogrel  75 mg Oral QHS  . docusate sodium  100 mg Oral BID  . doxazosin  1 mg Oral Daily  . enoxaparin (LOVENOX) injection  40 mg Subcutaneous Q24H  . finasteride  5 mg Oral QHS  . memantine  5 mg Oral BID  . multivitamin with minerals  1 tablet Oral Daily  . pravastatin  40 mg Oral Daily  . rOPINIRole  0.25 mg Oral QHS  . vitamin B-12  500 mcg Oral Daily   Continuous Infusions:  PRN Meds: acetaminophen **OR** acetaminophen, morphine injection, naproxen, nitroGLYCERIN, ondansetron **OR** ondansetron (ZOFRAN) IV  Allergies:    Allergies  Allergen Reactions  . Ambien [Zolpidem Tartrate]     unknown  . Aricept [Donepezil Hcl] Diarrhea  . Atorvastatin     muscle aches  . Belsomra [Suvorexant] Other (See Comments)    anxiety  . Penicillins Hives    Has patient had a PCN reaction causing immediate rash, facial/tongue/throat swelling, SOB or lightheadedness with hypotension: Yes Has patient had a PCN reaction causing severe rash involving mucus membranes or skin necrosis: Yes Has patient had a PCN reaction that required hospitalization No Has patient had a PCN reaction occurring within the last 10 years: No If all of the above answers are "NO", then may proceed with Cephalosporin use.     . Tamsulosin Other (See Comments)    Leg weakness  . Doxycycline Rash    Social History:   Social History   Socioeconomic History  . Marital status: Widowed    Spouse name: Not on file  . Number of children: Not on file  . Years of education: Not on file  . Highest education level: Not on file  Occupational History  . Not on file  Social Needs  . Financial resource strain: Not on file  . Food insecurity:    Worry: Not on file    Inability: Not on file  . Transportation needs:    Medical: Not on file    Non-medical: Not on file  Tobacco Use  .  Smoking status: Former Smoker    Last attempt to quit: 11/12/1962    Years since quitting: 55.4  . Smokeless tobacco: Never Used  . Tobacco comment: Quit 1964  Substance and Sexual Activity  . Alcohol use: Yes    Alcohol/week: 0.6 oz    Types: 1 Cans of beer per week  . Drug use: No  . Sexual activity: Never  Lifestyle  . Physical activity:    Days per week: Not on file    Minutes per session: Not on file  . Stress: Not on file  Relationships  . Social connections:    Talks on phone: Not on file    Gets together: Not on file    Attends religious service: Not on file    Active member of club or organization: Not on file    Attends meetings of clubs or organizations: Not on file    Relationship status: Not on file  . Intimate partner violence:    Fear of current or ex partner: Not on file    Emotionally abused: Not on file    Physically abused: Not on file    Forced sexual activity: Not on file  Other Topics Concern  . Not on file  Social History Narrative   Lives at [facility] with friend - Osvaldo Angst RN.   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 Vegetarian.    Family History:    Family History  Problem Relation Age of Onset  . Cancer Father 66        prostate and colon  . Cancer Mother        ovarian or uterine  . CAD Brother 70       MI, smoker  . Parkinson's disease Brother   . COPD Brother   . CAD Brother        MI  . Stroke Sister      ROS:  Please see the history of present illness.   All other ROS reviewed and negative.     Physical Exam/Data:   Vitals:   04/27/18 0326 04/27/18 0405 04/27/18 0745 04/27/18 1021  BP: (!) 96/55  100/63 108/65  Pulse: 75  84 84  Resp: 18     Temp: 97.8 F (36.6 C)  98.1 F (36.7 C) 98 F (36.7 C)  TempSrc: Oral  Oral Oral  SpO2: 97%  95% 98%  Weight:  186 lb 9.6 oz (84.6 kg)    Height:  5\' 8"  (1.727 m)     No intake or output data in the 24 hours ending 04/27/18 1046 Filed Weights   04/26/18 2227 04/27/18 0405  Weight: 190 lb (86.2 kg) 186 lb 9.6 oz (84.6 kg)   Body mass index is 28.37 kg/m.  General: Elderly gentleman, he appears to be in mild distress. HEENT: normal Lymph: no adenopathy Neck: no JVD Endocrine:  No thryomegaly Vascular:  Cardiac:   Regular rate S1-S2.  1/6 to 2/6 systolic murmur radiating to the upper right sternal border. Lungs:  clear to auscultation bilaterally, no wheezing, rhonchi or rales  Abd: soft, nontender, no hepatomegaly  Ext: no edema Musculoskeletal:  No deformities, BUE and BLE strength normal and equal Skin: warm and dry  Neuro:  CNs 2-12 intact, no focal abnormalities noted Psych:  Normal affect   EKG:  The  EKG was personally reviewed and demonstrates:  NSR ,   Inf. MI,  Previous ant. Lateral MI   Telemetry:  Telemetry was personally reviewed and demonstrates:  NSR   Relevant CV Studies:   Laboratory Data:  Chemistry Recent Labs  Lab 04/26/18 2231  NA 136  K 4.1  CL 102  CO2 25  GLUCOSE 156*  BUN 19  CREATININE 1.13  CALCIUM 9.2  GFRNONAA 56*  GFRAA >60  ANIONGAP 9    No results for input(s): PROT, ALBUMIN, AST, ALT, ALKPHOS, BILITOT in the last 168 hours. Hematology Recent Labs  Lab 04/26/18 2231  WBC  12.4*  RBC 4.39*  HGB 13.0  HCT 38.9*  MCV 88.5  MCH 29.6  MCHC 33.4  RDW 14.9*  PLT 281   Cardiac Enzymes Recent Labs  Lab 04/26/18 2231 04/27/18 0524  TROPONINI <0.03 <0.03   No results for input(s): TROPIPOC in the last 168 hours.  BNPNo results for input(s): BNP, PROBNP in the last 168 hours.  DDimer No results for input(s): DDIMER in the last 168 hours.  Radiology/Studies:  Dg Chest 1 View  Result Date: 04/26/2018 CLINICAL DATA:  Acute onset of chest pain. EXAM: CHEST  1 VIEW COMPARISON:  None. FINDINGS: Mild cardiomegaly and tortuous thoracic aorta. Retrocardiac hiatal hernia. Mild interstitial coarsening. No frank pulmonary edema. Suspect small pleural effusions incompletely assessed on portable AP view. No focal airspace disease. No pneumothorax. Chronic change of both shoulders. IMPRESSION: Mild cardiomegaly and small suspected pleural effusions. Hiatal hernia. Electronically Signed   By: Jeb Levering M.D.   On: 04/26/2018 23:48    Assessment and Plan:   1. 1.  Chest pain: Patient has a history of coronary artery disease and stenting of his right coronary artery.  His EKG shows the inferior myocardial infarction and also shows an anterolateral myocardial infarction.  There are no acute ST changes to suggest that this anterior lateral MI is recent and these anterolateral changes have been seen on previous EKGs.  We will get an echocardiogram for further assessment of his LV function.  We will consider doing a Myoview study in the morning for further evaluation.  We will continue to check cardiac enzymes.   For questions or updates, please contact [place] Please consult [email or web address] for contact info under Cardiology/STEMI.   Signed, Mertie Moores, MD  04/27/2018 10:46 AM

## 2018-04-27 NOTE — ED Notes (Signed)
Patient resting quietly with eyes closed

## 2018-04-27 NOTE — Care Management Obs Status (Signed)
[facility] NOTIFICATION   Patient Details  Name: Stephen [name] MRN: [id number] Date of Birth: 1927-04-27   Medicare Observation Status Notification Given:  Yes    Maeve Debord A Zakariah Dejarnette, RN 04/27/2018, 8:24 AM

## 2018-04-27 NOTE — Progress Notes (Signed)
Patient briefly seen and examined this morning. Agree with cardiology consultation I will order speech evaluation and neuro checks.

## 2018-04-27 NOTE — H&P (Addendum)
Stephen [name] is an 84 y.o. male.   Chief Complaint: Chest pain HPI: The patient with past medical history of CAD status post inferior infarct and PCI, diabetes type 2, hypertension and recent stroke presents to the emergency department after an episode of chest pain.  The patient took 3 nitroglycerin tablets without relief.  His significant other also reports that the patient was drooling and had difficulty speaking at the time EMS arrived.  In the emergency department the patient was back to baseline neurologic status.  Initial laboratory evaluation and EKG was reassuring but due to his risk factors the emergency department staff called the hospitalist service for admission.  Past Medical History:  Diagnosis Date  . Arthritis of knee   . BPH (benign prostatic hypertrophy)    w/ nocturia  . Coronary artery disease 03/2014   Inferior ST elevation myocardial infarction. Cardiac catheterization showed an occluded mid RCA. He had an angioplasty and drug-eluting stent placement with a 3.0 x 16 mm Promus drug-eluting stent. Ejection fraction was 45% by echo, completed cardiac rehab 06/2014  . Diabetes type 2, controlled ([place])   . History of basal cell cancer    s/p mohs  . History of colon polyps   . History of hypertension   . HLD (hyperlipidemia)    diet controlled in past  . Hypertension   . Insomnia    treated with multiple meds in past  . MI (myocardial infarction) ([place])   . Rosacea   . Skin cancer   . UTI (urinary tract infection)     Past Surgical History:  Procedure Laterality Date  . CARDIAC CATHETERIZATION  03/2014   [facility];x1 stent  . CATARACT EXTRACTION W/PHACO Left 10/09/2016   Procedure: CATARACT EXTRACTION PHACO AND INTRAOCULAR LENS PLACEMENT (IOC);  Surgeon: Birder Robson, MD;  Location: [facility] ORS;  Service: Ophthalmology;  Laterality: Left;  [place] 1.13 AP% 18.3 CDE 13.45 Fluid pack lot # [id number]  . COLONOSCOPY  08/2010   hyperplastic polyp, rec rpt 5 yrs  .  ESOPHAGOGASTRODUODENOSCOPY  05/2015   dilated stricture, normal biopsies, HH, no definite infection [name] @ [facility])  . MOHS SURGERY     basal cell chin/back  . REPLACEMENT TOTAL KNEE Left 08/2006  . VASECTOMY  1970    Family History  Problem Relation Age of Onset  . Cancer Father 31       prostate and colon  . Cancer Mother        ovarian or uterine  . CAD Brother 19       MI, smoker  . Parkinson's disease Brother   . COPD Brother   . CAD Brother        MI  . Stroke Sister    Social History:  reports that he quit smoking about 55 years ago. He has never used smokeless tobacco. He reports that he drinks about 0.6 oz of alcohol per week. He reports that he does not use drugs.  Allergies:  Allergies  Allergen Reactions  . Ambien [Zolpidem Tartrate]     unknown  . Aricept [Donepezil Hcl] Diarrhea  . Atorvastatin     muscle aches  . Belsomra [Suvorexant] Other (See Comments)    anxiety  . Penicillins Hives    Has patient had a PCN reaction causing immediate rash, facial/tongue/throat swelling, SOB or lightheadedness with hypotension: Yes Has patient had a PCN reaction causing severe rash involving mucus membranes or skin necrosis: Yes Has patient had a PCN reaction that required hospitalization No  Has patient had a PCN reaction occurring within the last 10 years: No If all of the above answers are "NO", then may proceed with Cephalosporin use.   . Tamsulosin Other (See Comments)    Leg weakness  . Doxycycline Rash    Medications Prior to Admission  Medication Sig Dispense Refill  . aspirin EC 81 MG tablet Take 81 mg by mouth every morning.    . clonazePAM (KLONOPIN) 0.5 MG tablet Take 0.5 mg by mouth at bedtime.    . clopidogrel (PLAVIX) 75 MG tablet Take 1 tablet (75 mg total) by mouth daily. (Patient taking differently: Take 75 mg by mouth at bedtime. ) 30 tablet 2  . doxazosin (CARDURA) 1 MG tablet TAKE 1 TABLET BY MOUTH EVERY DAY 30 tablet 11  . eszopiclone (LUNESTA) 2  MG TABS tablet Take 1 tablet (2 mg total) by mouth at bedtime as needed for sleep. Take immediately before bedtime 30 tablet 5  . finasteride (PROSCAR) 5 MG tablet Take 5 mg by mouth at bedtime.    . memantine (NAMENDA) 5 MG tablet Take 1 tablet (5 mg total) by mouth 2 (two) times daily. 60 tablet 6  . metFORMIN (GLUCOPHAGE) 500 MG tablet TAKE 1 TABLET (500 MG TOTAL) BY MOUTH 2 (TWO) TIMES DAILY WITH A MEAL. 180 tablet 1  . Multiple Vitamin (MULTIVITAMIN WITH MINERALS) TABS tablet Take 1 tablet by mouth daily.    . naproxen sodium (ANAPROX) 220 MG tablet Take 220 mg by mouth daily as needed (pain).    . pravastatin (PRAVACHOL) 40 MG tablet TAKE 1 TABLET (40 MG TOTAL) BY MOUTH DAILY. 90 tablet 3  . rOPINIRole (REQUIP) 0.25 MG tablet Take 1 tablet (0.25 mg total) by mouth at bedtime. 90 tablet 2  . vitamin B-12 (CYANOCOBALAMIN) 500 MCG tablet Take 500 mcg by mouth daily.      Results for orders placed or performed during the hospital encounter of 04/26/18 (from the past 48 hour(s))  Basic metabolic panel     Status: Abnormal   Collection Time: 04/26/18 10:31 PM  Result Value Ref Range   Sodium 136 135 - 145 mmol/L   Potassium 4.1 3.5 - 5.1 mmol/L   Chloride 102 101 - 111 mmol/L   CO2 25 22 - 32 mmol/L   Glucose, Bld 156 (H) 65 - 99 mg/dL   BUN 19 6 - 20 mg/dL   Creatinine, Ser 1.13 0.61 - 1.24 mg/dL   Calcium 9.2 8.9 - 10.3 mg/dL   GFR calc non Af Amer 56 (L) >60 mL/min   GFR calc Af Amer >60 >60 mL/min    Comment: (NOTE) The eGFR has been calculated using the CKD EPI equation. This calculation has not been validated in all clinical situations. eGFR's persistently <60 mL/min signify possible Chronic Kidney Disease.    Anion gap 9 5 - 15    Comment: Performed at [facility], [address] [place], [place] [address]  CBC     Status: Abnormal   Collection Time: 04/26/18 10:31 PM  Result Value Ref Range   WBC 12.4 (H) 3.8 - 10.6 K/uL   RBC 4.39 (L) 4.40 - 5.90 MIL/uL    Hemoglobin 13.0 13.0 - 18.0 g/dL   HCT 38.9 (L) 40.0 - 52.0 %   MCV 88.5 80.0 - 100.0 fL   MCH 29.6 26.0 - 34.0 pg   MCHC 33.4 32.0 - 36.0 g/dL   RDW 14.9 (H) 11.5 - 14.5 %   Platelets 281 150 -  440 K/uL    Comment: Performed at [facility], [address] [place], [place] [address]  Troponin I     Status: None   Collection Time: 04/26/18 10:31 PM  Result Value Ref Range   Troponin I <0.03 <0.03 ng/mL    Comment: Performed at [facility], [address] [place], [place] [address]  Protime-INR     Status: None   Collection Time: 04/26/18 10:31 PM  Result Value Ref Range   Prothrombin Time 13.7 11.4 - 15.2 seconds   INR 1.06     Comment: Performed at [facility], [address] [place], [place] [address]   Dg Chest 1 View  Result Date: 04/26/2018 CLINICAL DATA:  Acute onset of chest pain. EXAM: CHEST  1 VIEW COMPARISON:  None. FINDINGS: Mild cardiomegaly and tortuous thoracic aorta. Retrocardiac hiatal hernia. Mild interstitial coarsening. No frank pulmonary edema. Suspect small pleural effusions incompletely assessed on portable AP view. No focal airspace disease. No pneumothorax. Chronic change of both shoulders. IMPRESSION: Mild cardiomegaly and small suspected pleural effusions. Hiatal hernia. Electronically Signed   By: Jeb Levering M.D.   On: 04/26/2018 23:48    Review of Systems  Constitutional: Negative for chills and fever.  HENT: Negative for sore throat and tinnitus.   Eyes: Negative for blurred vision and redness.  Respiratory: Negative for cough and shortness of breath.   Cardiovascular: Positive for chest pain. Negative for palpitations, orthopnea and PND.  Gastrointestinal: Negative for abdominal pain, diarrhea, nausea and vomiting.  Genitourinary: Negative for dysuria, frequency and urgency.  Musculoskeletal: Negative for joint pain and myalgias.  Skin: Negative for rash.       No lesions  Neurological: Negative for speech  change, focal weakness and weakness.  Endo/Heme/Allergies: Does not bruise/bleed easily.       No temperature intolerance  Psychiatric/Behavioral: Negative for depression and suicidal ideas.    Blood pressure 104/66, pulse 82, temperature 98.3 F (36.8 C), temperature source Oral, resp. rate 18, height _0  (1.803 m), weight 86.2 kg (190 lb), SpO2 99 %. Physical Exam  Constitutional: He is oriented to person, place, and time. He appears well-developed and well-nourished. No distress.  HENT:  Head: Normocephalic and atraumatic.  Mouth/Throat: Oropharynx is clear and moist.  Eyes: Pupils are equal, round, and reactive to light. Conjunctivae and EOM are normal. No scleral icterus.  Neck: Normal range of motion. Neck supple. No JVD present. No tracheal deviation present. No thyromegaly present.  Cardiovascular: Normal rate, regular rhythm and normal heart sounds. Exam reveals no gallop and no friction rub.  No murmur heard. Respiratory: Effort normal and breath sounds normal. No respiratory distress. He has no wheezes.  GI: Soft. Bowel sounds are normal. He exhibits no distension.  Genitourinary:  Genitourinary Comments: Deferred  Musculoskeletal: Normal range of motion. He exhibits no edema.  Lymphadenopathy:    He has no cervical adenopathy.  Neurological: He is alert and oriented to person, place, and time. No cranial nerve deficit.  Skin: Skin is warm and dry. No rash noted. No erythema.  Psychiatric: He has a normal mood and affect. His behavior is normal. Judgment and thought content normal.     Assessment/Plan This is an 85 year old male admitted for chest pain. 1.  Chest pain: Initial troponin negative; no EKG changes.  Monitor telemetry.  Follow cardiac biomarkers.  No respiratory distress at this time although chest pain may have been a brief period of demand ischemia as the patient has increased  interstitial markings on chest x-ray indicative of pulmonary edema (my read).   Lungs are clear and pulse oximetry normal at this time, therefore I have deferred diuretics.  Cardiology consulted.   2.  CAD: Chest pain-free; continue aspirin and Plavix 3.  Essential hypertension: Controlled; continue to monitor 4.  Diabetes mellitus type II: Diet controlled 5.  Hyperlipidemia: Continue statin therapy 6.  BPH: Continue finasteride and doxazosin 7.  Cerebrovascular disease: Patient significant other advised to inform treatment team if the patient's neurologic status changes that would require imaging and further stroke evaluation. 8.  Dementia: Continue Namenda  9.  Restless leg syndrome: Continue Requip 10.  DVT prophylaxis: Lovenox 11.  GI prophylaxis: None The patient is a DNR.  Time spent on admission orders and patient care approximately 45 minutes  Harrie Foreman, MD 04/27/2018, 3:11 AM

## 2018-04-27 NOTE — Care Management Note (Signed)
Case Management Note  Patient Details  Name: Stephen [name] MRN: [id number] Date of Birth: [birth year]  Subjective/Objective:      Patient admitted to [facility] under observation status for chest pain. Currently resides with his friend Osvaldo Angst [phone]. Resides in a 'cottage" at [facility]. Does not require any DME and is able to complete all activities of daily living without concern. [name] provides transportation but the patient also has transportation through [facility] if needed. Dr Danise Mina is PCP. Uses [facility] on [address] without issues.               Action/Plan:  RNCM to continue to follow for any needs.  Expected Discharge Date:                  Expected Discharge Plan:     In-House Referral:     Discharge planning Services     Post Acute Care Choice:    Choice offered to:     DME Arranged:    DME Agency:     HH Arranged:    HH Agency:     Status of Service:     If discussed at [employer], dates discussed:    Additional Comments:  Latanya Maudlin, RN 04/27/2018, 8:28 AM

## 2018-04-28 ENCOUNTER — Encounter: Payer: Self-pay | Admitting: Radiology

## 2018-04-28 ENCOUNTER — Observation Stay (HOSPITAL_BASED_OUTPATIENT_CLINIC_OR_DEPARTMENT_OTHER): Payer: Medicare Other

## 2018-04-28 DIAGNOSIS — R0789 Other chest pain: Secondary | ICD-10-CM

## 2018-04-28 DIAGNOSIS — E119 Type 2 diabetes mellitus without complications: Secondary | ICD-10-CM | POA: Diagnosis not present

## 2018-04-28 DIAGNOSIS — I2510 Atherosclerotic heart disease of native coronary artery without angina pectoris: Secondary | ICD-10-CM | POA: Diagnosis not present

## 2018-04-28 DIAGNOSIS — R079 Chest pain, unspecified: Secondary | ICD-10-CM | POA: Diagnosis not present

## 2018-04-28 DIAGNOSIS — I10 Essential (primary) hypertension: Secondary | ICD-10-CM | POA: Diagnosis not present

## 2018-04-28 LAB — BASIC METABOLIC PANEL
ANION GAP: 9 (ref 5–15)
BUN: 13 mg/dL (ref 6–20)
CHLORIDE: 106 mmol/L (ref 101–111)
CO2: 23 mmol/L (ref 22–32)
CREATININE: 1.08 mg/dL (ref 0.61–1.24)
Calcium: 8.5 mg/dL — ABNORMAL LOW (ref 8.9–10.3)
GFR calc non Af Amer: 59 mL/min — ABNORMAL LOW (ref >60)
Glucose, Bld: 128 mg/dL — ABNORMAL HIGH (ref 65–99)
POTASSIUM: 3.6 mmol/L (ref 3.5–5.1)
SODIUM: 138 mmol/L (ref 135–145)

## 2018-04-28 LAB — NM MYOCAR MULTI W/SPECT W/WALL MOTION / EF
CHL CUP NUCLEAR SSS: 9
CSEPEDS: 0 s
CSEPHR: 68 %
CSEPPHR: 91 {beats}/min
Estimated workload: 1 METS
Exercise duration (min): 0 min
LV dias vol: 80 mL (ref 62–150)
LVSYSVOL: 44 mL
MPHR: 132 {beats}/min
NUC STRESS TID: 1.1
Rest HR: 75 {beats}/min
SDS: 0
SRS: 12

## 2018-04-28 MED ORDER — TECHNETIUM TC 99M TETROFOSMIN IV KIT
28.3360 | PACK | Freq: Once | INTRAVENOUS | Status: AC | PRN
  Administered 2018-04-28: 28.336 via INTRAVENOUS

## 2018-04-28 MED ORDER — NITROGLYCERIN 0.4 MG SL SUBL: 0.4000 mg | SUBLINGUAL_TABLET | SUBLINGUAL | 1 refills | Status: DC | PRN

## 2018-04-28 MED ORDER — REGADENOSON 0.4 MG/5ML IV SOLN
0.4000 mg | Freq: Once | INTRAVENOUS | Status: AC
  Administered 2018-04-28: 0.4 mg via INTRAVENOUS

## 2018-04-28 MED ORDER — PANTOPRAZOLE SODIUM 40 MG PO TBEC
40.0000 mg | DELAYED_RELEASE_TABLET | Freq: Every day | ORAL | Status: DC
Start: 2018-04-28 — End: 2018-04-28
  Administered 2018-04-28: 40 mg via ORAL
  Filled 2018-04-28: qty 1

## 2018-04-28 MED ORDER — TECHNETIUM TC 99M TETROFOSMIN IV KIT
14.0820 | PACK | Freq: Once | INTRAVENOUS | Status: AC | PRN
  Administered 2018-04-28: 14.082 via INTRAVENOUS

## 2018-04-28 MED ORDER — SODIUM CHLORIDE 0.9% FLUSH
3.0000 mL | Freq: Two times a day (BID) | INTRAVENOUS | Status: DC
  Administered 2018-04-28: 3 mL via INTRAVENOUS

## 2018-04-28 MED ORDER — OMEPRAZOLE 20 MG PO CPDR: 20.0000 mg | DELAYED_RELEASE_CAPSULE | Freq: Two times a day (BID) | ORAL | 1 refills | Status: DC

## 2018-04-28 NOTE — Progress Notes (Signed)
Progress Note  Patient Name: Stephen [name] Date of Encounter: 04/28/2018  Primary Cardiologist: Kathlyn Sacramento, MD  Subjective   No chest pain or sob.  For MV this AM.  Inpatient Medications    Scheduled Meds: . aspirin EC  81 mg Oral [id number]  . clonazePAM  0.5 mg Oral QHS  . clopidogrel  75 mg Oral QHS  . docusate sodium  100 mg Oral BID  . doxazosin  1 mg Oral Daily  . enoxaparin (LOVENOX) injection  40 mg Subcutaneous Q24H  . finasteride  5 mg Oral QHS  . memantine  5 mg Oral BID  . multivitamin with minerals  1 tablet Oral Daily  . nitroGLYCERIN  1 inch Topical Q6H  . pravastatin  40 mg Oral Daily  . rOPINIRole  0.25 mg Oral QHS  . vitamin B-12  500 mcg Oral Daily   Continuous Infusions:  PRN Meds: acetaminophen **OR** acetaminophen, morphine injection, naproxen, nitroGLYCERIN, ondansetron **OR** ondansetron (ZOFRAN) IV, technetium tetrofosmin   Vital Signs    Vitals:   04/28/18 0057 04/28/18 0350 04/28/18 0354 04/28/18 0750  BP: 107/62  130/62 109/69  Pulse: 78  83 85  Resp:   18 16  Temp:   98.5 F (36.9 C) (!) 97.5 F (36.4 C)  TempSrc:   Oral Oral  SpO2:   97% 96%  Weight:  184 lb 6.4 oz (83.6 kg)    Height:       No intake or output data in the 24 hours ending 04/28/18 1011 Filed Weights   04/26/18 2227 04/27/18 0405 04/28/18 0350  Weight: 190 lb (86.2 kg) 186 lb 9.6 oz (84.6 kg) 184 lb 6.4 oz (83.6 kg)    Physical Exam   GEN: Well nourished, well developed, in no acute distress.  HEENT: Grossly normal.  Neck: Supple, no JVD, carotid bruits, or masses. Cardiac: RRR, 2/6 SEM RUSB, no rubs, or gallops. No clubbing, cyanosis, edema.  Radials/DP/PT 1+ and equal bilaterally.  Respiratory:  Respirations regular and unlabored, bibasilar crackles, otw cta. GI: Soft, nontender, nondistended, BS + x 4. MS: no deformity or atrophy. Skin: warm and dry, no rash. Neuro:  Strength and sensation are intact. Psych: AAOx3.  Normal affect.  Labs      Chemistry Recent Labs  Lab 04/26/18 2231 04/28/18 0428  NA 136 138  K 4.1 3.6  CL 102 106  CO2 25 23  GLUCOSE 156* 128*  BUN 19 13  CREATININE 1.13 1.08  CALCIUM 9.2 8.5*  GFRNONAA 56* 59*  GFRAA >60 >60  ANIONGAP 9 9     Hematology Recent Labs  Lab 04/26/18 2231  WBC 12.4*  RBC 4.39*  HGB 13.0  HCT 38.9*  MCV 88.5  MCH 29.6  MCHC 33.4  RDW 14.9*  PLT 281    Cardiac Enzymes Recent Labs  Lab 04/26/18 2231 04/27/18 0524 04/27/18 1131 04/27/18 1730  TROPONINI <0.03 <0.03 <0.03 <0.03     Radiology    Dg Chest 1 View  Result Date: 04/26/2018 CLINICAL DATA:  Acute onset of chest pain. EXAM: CHEST  1 VIEW COMPARISON:  None. FINDINGS: Mild cardiomegaly and tortuous thoracic aorta. Retrocardiac hiatal hernia. Mild interstitial coarsening. No frank pulmonary edema. Suspect small pleural effusions incompletely assessed on portable AP view. No focal airspace disease. No pneumothorax. Chronic change of both shoulders. IMPRESSION: Mild cardiomegaly and small suspected pleural effusions. Hiatal hernia. Electronically Signed   By: Jeb Levering M.D.   On: 04/26/2018 23:48  Telemetry    RSR - Personally Reviewed  Cardiac Studies   2D Echocardiogram 5.5.2019  ------------------------------------------------------------------- Study Conclusions  - Procedure narrative: Transthoracic echocardiography. Image   quality was poor. The study was technically difficult, as a   result of poor acoustic windows. - Left ventricle: The cavity size was normal. Systolic function was   normal. The estimated ejection fraction was in the range of 55%   to 60%. Wall motion was normal; there were no regional wall   motion abnormalities. Doppler parameters are consistent with   abnormal left ventricular relaxation (grade 1 diastolic   dysfunction). - Aortic valve: Transvalvular velocity was within the normal range.   There was no stenosis. Peak velocity (S): 138 cm/s. Peak  gradient   (S): 8 mm Hg. - Left atrium: The atrium was normal in size. - Pulmonary arteries: Systolic pressure was within the normal   range.  Impressions:  - No cardiac source of emboli was indentified.  Patient Profile     84 y.o. male w/a h/o CAD s/p Inf STEMI in 03/2017 w/ DES to the mRCA, HTN, HL, DMII, who was admitted 6/15 with chest pain, AMS, and altered speech. He has r/o for MI.  Assessment & Plan    1.  Chest Pain/CAD:  S/p prior inf STEMI and RCA stenting in 03/2017.  Admitted 6/15 with chest pain in the setting of drooling and altered speech.  Troponins have been nl.  He has not had any further chest pain.  He will undergo MV this AM. Cont asa, plavix, statin.  2.  Essential HTN:  Stable.   3.  HL:  ldl 46 on pravastatin.  4.  ? TIA: Difficulty speaking and drooling at time of admissoin.  No recurrence.  Per IM.  Signed, Murray Hodgkins, NP  04/28/2018, 10:11 AM    For questions or updates, please contact   Please consult [email or web address] for contact info under Cardiology/STEMI.

## 2018-04-28 NOTE — Progress Notes (Addendum)
SLP Cancellation Note  Patient Details Name: Stephen [name] MRN: [id number] DOB: 14-Jan-1927   Cancelled treatment:       Reason Eval/Treat Not Completed: SLP screened, no needs identified, will sign off(chart reviewed; consulted NSG then met w/ family member). Per significant other family member and NSG, pt has not been having any difficulty eating/drinking at meals post admission. Pt has been on a regular diet since admission until NPO for test this morning. NSG will f/u w/ pt and family at lunch meal for any concerns and reconsult ST services if indicated. Recommended general aspiration precautions. Family member agreed. NSG agreed. Of note, pt will resume ST services HH at [facility] for cognitive-linguistic tx as he was receiving for recent CVA and baseline Dementia per family member.    Orinda Kenner, MS, CCC-SLP Watson,Katherine 04/28/2018, 9:48 AM

## 2018-04-28 NOTE — Discharge Summary (Signed)
[name] NAME: Stephen [name]    MR#:  [id number]  DATE OF BIRTH:  1926/12/31  DATE OF ADMISSION:  04/26/2018   ADMITTING PHYSICIAN: Harrie Foreman, MD  DATE OF DISCHARGE:  04/28/18  PRIMARY CARE PHYSICIAN: Ria Bush, MD   ADMISSION DIAGNOSIS:   Nonspecific chest pain [R07.9]  DISCHARGE DIAGNOSIS:   Active Problems:   Chest pain   SECONDARY DIAGNOSIS:   Past Medical History:  Diagnosis Date  . Arthritis of knee   . BPH (benign prostatic hypertrophy)    w/ nocturia  . Coronary artery disease 03/2014   Inferior ST elevation myocardial infarction. Cardiac catheterization showed an occluded mid RCA. He had an angioplasty and drug-eluting stent placement with a 3.0 x 16 mm Promus drug-eluting stent. Ejection fraction was 45% by echo, completed cardiac rehab 06/2014  . Diabetes type 2, controlled ([place])   . History of basal cell cancer    s/p mohs  . History of colon polyps   . History of hypertension   . HLD (hyperlipidemia)    diet controlled in past  . Hypertension   . Insomnia    treated with multiple meds in past  . MI (myocardial infarction) ([place])   . Rosacea   . Skin cancer   . UTI (urinary tract infection)     HOSPITAL COURSE:   84 year old male with past medical history significant for CAD status post stents, diabetes, hypertension, mild cognitive decline and hyperlipidemia brought in from home secondary to chest pain.  1.  Chest pain-could be esophageal spasms. -Patient has bad hiatal hernia.  He was admitted to rule out myocardial infarction -Troponins negative x3. -Myoview was done and did not show any findings concerning for acute ischemia. -Cardiology consult is appreciated -Patient did feel better with nitro.  We will give him nitroglycerin as needed sublingual tablets -Has a follow-up with cardiology next week which he can keep up with.  2.  He-stable.  Appreciate cardiology  consult.  Patient can continue Plavix and statin  3.  Restless leg syndrome-continue Requip  4.  Diabetes-on metformin  5.  Depression, mild dementia and anxiety-continue home medications.  Patient is independent at baseline.  His significant other has been updated at bedside.  DISCHARGE CONDITIONS:   Guarded  CONSULTS OBTAINED:   Treatment Team:  Nahser, Wonda Cheng, MD Wellington Hampshire, MD  DRUG ALLERGIES:   Allergies  Allergen Reactions  . Ambien [Zolpidem Tartrate]     unknown  . Aricept [Donepezil Hcl] Diarrhea  . Atorvastatin     muscle aches  . Belsomra [Suvorexant] Other (See Comments)    anxiety  . Penicillins Hives    Has patient had a PCN reaction causing immediate rash, facial/tongue/throat swelling, SOB or lightheadedness with hypotension: Yes Has patient had a PCN reaction causing severe rash involving mucus membranes or skin necrosis: Yes Has patient had a PCN reaction that required hospitalization No Has patient had a PCN reaction occurring within the last 10 years: No If all of the above answers are "NO", then may proceed with Cephalosporin use.   . Tamsulosin Other (See Comments)    Leg weakness  . Doxycycline Rash   DISCHARGE MEDICATIONS:   Allergies as of 04/28/2018      Reactions   Ambien [zolpidem Tartrate]    unknown   Aricept [donepezil Hcl] Diarrhea   Atorvastatin    muscle aches   Belsomra [suvorexant] Other (See Comments)  anxiety   Penicillins Hives   Has patient had a PCN reaction causing immediate rash, facial/tongue/throat swelling, SOB or lightheadedness with hypotension: Yes Has patient had a PCN reaction causing severe rash involving mucus membranes or skin necrosis: Yes Has patient had a PCN reaction that required hospitalization No Has patient had a PCN reaction occurring within the last 10 years: No If all of the above answers are "NO", then may proceed with Cephalosporin use.   Tamsulosin Other (See Comments)   Leg  weakness   Doxycycline Rash      Medication List    TAKE these medications   aspirin EC 81 MG tablet Take 81 mg by mouth every morning.   clonazePAM 0.5 MG tablet Commonly known as:  KLONOPIN Take 0.5 mg by mouth at bedtime.   clopidogrel 75 MG tablet Commonly known as:  PLAVIX Take 1 tablet (75 mg total) by mouth daily. What changed:  when to take this   doxazosin 1 MG tablet Commonly known as:  CARDURA TAKE 1 TABLET BY MOUTH EVERY DAY   eszopiclone 2 MG Tabs tablet Commonly known as:  LUNESTA Take 1 tablet (2 mg total) by mouth at bedtime as needed for sleep. Take immediately before bedtime   finasteride 5 MG tablet Commonly known as:  PROSCAR Take 5 mg by mouth at bedtime.   memantine 5 MG tablet Commonly known as:  NAMENDA Take 1 tablet (5 mg total) by mouth 2 (two) times daily.   metFORMIN 500 MG tablet Commonly known as:  GLUCOPHAGE TAKE 1 TABLET (500 MG TOTAL) BY MOUTH 2 (TWO) TIMES DAILY WITH A MEAL.   multivitamin with minerals Tabs tablet Take 1 tablet by mouth daily.   naproxen sodium 220 MG tablet Commonly known as:  ALEVE Take 220 mg by mouth daily as needed (pain). Notes to patient:  NONE GIVEN TODAY   nitroGLYCERIN 0.4 MG SL tablet Commonly known as:  NITROSTAT Place 1 tablet (0.4 mg total) under the tongue every 5 (five) minutes as needed for chest pain.   omeprazole 20 MG capsule Commonly known as:  PRILOSEC Take 1 capsule (20 mg total) by mouth 2 (two) times daily before a meal.   pravastatin 40 MG tablet Commonly known as:  PRAVACHOL TAKE 1 TABLET (40 MG TOTAL) BY MOUTH DAILY.   rOPINIRole 0.25 MG tablet Commonly known as:  REQUIP Take 1 tablet (0.25 mg total) by mouth at bedtime.   vitamin B-12 500 MCG tablet Commonly known as:  CYANOCOBALAMIN Take 500 mcg by mouth daily.        DISCHARGE INSTRUCTIONS:   1. Cardiology f/u in 1 week 2. PCP f/u in 1-2 weeks  DIET:   Cardiac diet  ACTIVITY:   Activity as  tolerated  OXYGEN:   Home Oxygen: No.  Oxygen Delivery: room air  DISCHARGE LOCATION:   home   If you experience worsening of your admission symptoms, develop shortness of breath, life threatening emergency, suicidal or homicidal thoughts you must seek medical attention immediately by calling 911 or calling your MD immediately  if symptoms less severe.  You Must read complete instructions/literature along with all the possible adverse reactions/side effects for all the Medicines you take and that have been prescribed to you. Take any new Medicines after you have completely understood and accpet all the possible adverse reactions/side effects.   Please note  You were cared for by a hospitalist during your hospital stay. If you have any questions about your discharge medications or  the care you received while you were in the hospital after you are discharged, you can call the unit and asked to speak with the hospitalist on call if the hospitalist that took care of you is not available. Once you are discharged, your primary care physician will handle any further medical issues. Please note that NO REFILLS for any discharge medications will be authorized once you are discharged, as it is imperative that you return to your primary care physician (or establish a relationship with a primary care physician if you do not have one) for your aftercare needs so that they can reassess your need for medications and monitor your lab values.    On the day of Discharge:  VITAL SIGNS:   Blood pressure 111/67, pulse 79, temperature (!) 97.5 F (36.4 C), temperature source Oral, resp. rate 16, height 5\' 8"  (1.727 m), weight 83.6 kg (184 lb 6.4 oz), SpO2 96 %.  PHYSICAL EXAMINATION:    GENERAL:  84 y.o.-year-old elderly patient lying in the bed with no acute distress.  EYES: Pupils equal, round, reactive to light and accommodation. No scleral icterus. Extraocular muscles intact.  HEENT: Head atraumatic,  normocephalic. Oropharynx and nasopharynx clear. Rhinophyma- thick broad nose. NECK:  Supple, no jugular venous distention. No thyroid enlargement, no tenderness.  LUNGS: Normal breath sounds bilaterally, no wheezing, rales,rhonchi or crepitation. No use of accessory muscles of respiration. Decreased bibasilar breath sounds CARDIOVASCULAR: S1, S2 normal. No rubs, or gallops. 2/6 systolic murmur present ABDOMEN: Soft, non-tender, non-distended. Bowel sounds present. No organomegaly or mass.  EXTREMITIES: No pedal edema, cyanosis, or clubbing.  NEUROLOGIC: Cranial nerves II through XII are intact. Muscle strength 5/5 in all extremities. Sensation intact. Gait not checked.  PSYCHIATRIC: The patient is alert and oriented x 2-3.  SKIN: No obvious rash, lesion, or ulcer.   DATA REVIEW:   CBC Recent Labs  Lab 04/26/18 2231  WBC 12.4*  HGB 13.0  HCT 38.9*  PLT 281    Chemistries  Recent Labs  Lab 04/28/18 0428  NA 138  K 3.6  CL 106  CO2 23  GLUCOSE 128*  BUN 13  CREATININE 1.08  CALCIUM 8.5*     Microbiology Results  Results for orders placed or performed during the hospital encounter of 04/26/18  MRSA PCR Screening     Status: None   Collection Time: 04/27/18  2:00 AM  Result Value Ref Range Status   MRSA by PCR NEGATIVE NEGATIVE Final    Comment:        The GeneXpert MRSA Assay (FDA approved for NASAL specimens only), is one component of a comprehensive MRSA colonization surveillance program. It is not intended to diagnose MRSA infection nor to guide or monitor treatment for MRSA infections. Performed at [facility], [address] [place], [place] [address]     RADIOLOGY:  Nm Myocar Multi W/spect Tamela Oddi Motion / Ef  Result Date: 04/28/2018 Pharmacological myocardial perfusion imaging study with no significant  Ischemia Non-attenuation corrected images with fixed inferior wall defect Attenuation corrected images with significant GI uptake artifact  limiting appropriate review of the inferior wall, otherwise no ischemia Inferior wall hypokinesis,  EF estimated at 87% No EKG changes concerning for ischemia at peak stress or in recovery. Low risk scan Clinical correlation recommended, given his GI uptake artifact,  if he has anginal symptoms, consider alternate study such as cardiac CTA Signed, Esmond Plants, MD, Ph.D [facility], [facility]     Management plans discussed with the patient, family  and they are in agreement.  CODE STATUS:     Code Status Orders  (From admission, onward)        Start     Ordered   04/27/18 0152  Do not attempt resuscitation (DNR)  Continuous    Question Answer Comment  In the event of cardiac or respiratory ARREST Do not call a "code blue"   In the event of cardiac or respiratory ARREST Do not perform Intubation, CPR, defibrillation or ACLS   In the event of cardiac or respiratory ARREST Use medication by any route, position, wound care, and other measures to relive pain and suffering. May use oxygen, suction and manual treatment of airway obstruction as needed for comfort.      04/27/18 0151    Code Status History    Date Active Date Inactive Code Status Order ID Comments User Context   03/17/2018 0941 03/17/2018 1838 DNR [id number]  Demetrios Loll, MD Inpatient   03/15/2018 1208 03/17/2018 0941 Full Code [id number]  Hillary Bow, MD ED    Advance Directive Documentation     Most Recent Value  Type of Advance Directive  Out of facility DNR (pink MOST or yellow form)  Pre-existing out of facility DNR order (yellow form or pink MOST form)  Yellow form placed in chart (order not valid for inpatient use)  "MOST" Form in Place?  -      TOTAL TIME TAKING CARE OF THIS PATIENT: 38 minutes.    Gladstone Lighter M.D on 04/28/2018 at 2:47 PM  Between 7am to 6pm - Pager - [phone]  After 6pm go to [email or web address] - Proofreader  [facility]  Office  [phone]  CC: Primary care  physician; Ria Bush, MD   Note: This dictation was prepared with Dragon dictation along with smaller phrase technology. Any transcriptional errors that result from this process are unintentional.

## 2018-04-28 NOTE — Progress Notes (Signed)
Discharged to [facility] independent living with his significant other who lives with him.  Medications and appointments reviewed with her.

## 2018-04-29 ENCOUNTER — Telehealth: Payer: Self-pay | Admitting: *Deleted

## 2018-04-29 NOTE — Telephone Encounter (Signed)
Transition Care Management Follow-up Telephone Call Completed by pt's significant other Osvaldo Angst   Date discharged? 04/28/2018    How have you been since you were released from the hospital? "ok"   Do you understand why you were in the hospital? yes   Do you understand the discharge instructions? yes   Where were you discharged to? home   Items Reviewed:  Medications reviewed: yes  Allergies reviewed: yes  Dietary changes reviewed: yes  Referrals reviewed: yes   Functional Questionnaire:   Activities of Daily Living (ADLs):   He states they are independent in the following: independent in all areas "when he's prompted to"     Any transportation issues/concerns?: no   Any patient concerns? no   Confirmed importance and date/time of follow-up visits scheduled yes  Provider Appointment booked with Dr Danise Mina 6/24  Confirmed with patient if condition begins to worsen call PCP or go to the ER.  Patient was given the office number and encouraged to call back with question or concerns.  : yes

## 2018-04-29 NOTE — Telephone Encounter (Signed)
Lm requesting return call to complete TCM and confirm hosp f/u appt  

## 2018-05-05 ENCOUNTER — Ambulatory Visit (INDEPENDENT_AMBULATORY_CARE_PROVIDER_SITE_OTHER): Payer: Medicare Other | Admitting: Family Medicine

## 2018-05-05 ENCOUNTER — Encounter: Payer: Self-pay | Admitting: Family Medicine

## 2018-05-05 VITALS — BP 118/70 | HR 82 | Temp 98.1°F | Ht 67.0 in | Wt 188.5 lb

## 2018-05-05 DIAGNOSIS — I639 Cerebral infarction, unspecified: Secondary | ICD-10-CM

## 2018-05-05 DIAGNOSIS — K449 Diaphragmatic hernia without obstruction or gangrene: Secondary | ICD-10-CM | POA: Diagnosis not present

## 2018-05-05 DIAGNOSIS — I69898 Other sequelae of other cerebrovascular disease: Secondary | ICD-10-CM

## 2018-05-05 DIAGNOSIS — G4709 Other insomnia: Secondary | ICD-10-CM

## 2018-05-05 DIAGNOSIS — F0150 Vascular dementia without behavioral disturbance: Secondary | ICD-10-CM | POA: Diagnosis not present

## 2018-05-05 DIAGNOSIS — R4701 Aphasia: Secondary | ICD-10-CM

## 2018-05-05 DIAGNOSIS — R011 Cardiac murmur, unspecified: Secondary | ICD-10-CM

## 2018-05-05 DIAGNOSIS — R0789 Other chest pain: Secondary | ICD-10-CM | POA: Diagnosis not present

## 2018-05-05 DIAGNOSIS — G4731 Primary central sleep apnea: Secondary | ICD-10-CM

## 2018-05-05 DIAGNOSIS — Z66 Do not resuscitate: Secondary | ICD-10-CM | POA: Insufficient documentation

## 2018-05-05 NOTE — Assessment & Plan Note (Addendum)
Progressive, worsened after CVA. Has established with neurology, who has increased namenda dose to BID. MMSE deteriorated from 24 --->11/30. Fortunately no change in level of care need at this time.

## 2018-05-05 NOTE — Assessment & Plan Note (Signed)
This was confirmed during office visit today.

## 2018-05-05 NOTE — Assessment & Plan Note (Signed)
Continues working with speech therapy.

## 2018-05-05 NOTE — Assessment & Plan Note (Signed)
Marked bowel sounds on auscultation of heart today. Pt doing well with BID PPI dosing.

## 2018-05-05 NOTE — Assessment & Plan Note (Signed)
Appreciate neuro sleep care ([facility]).

## 2018-05-05 NOTE — Patient Instructions (Addendum)
You are doing well today. May use sublingual nitroglycerin for recurrent chest discomfort, let us or cardiology know if used.  Good to see you today, call us with questions.  Keep upcoming appointments.

## 2018-05-05 NOTE — Progress Notes (Signed)
BP 118/70 (BP Location: Left Arm, Patient Position: Sitting, Cuff Size: Normal)   Pulse 82   Temp 98.1 F (36.7 C) (Oral)   Ht 5\' 7"  (1.702 m)   Wt 188 lb 8 oz (85.5 kg)   SpO2 98%   BMI 29.52 kg/m    CC: hosp f/u visit Subjective:    Patient ID: Stephen [name], male    DOB: January 15, 1927, 84 y.o.   MRN: [id number]  HPI: Stephen [name] is a 84 y.o. male presenting on 05/05/2018 for Hospitalization Follow-up (Admitted to [facility] on 04/26/18, dx chest pain.)   Recent hospitalization for chest pain - ruled out MI with negative troponins, myoview without ischemia. Cardiology saw patient during hospitalization, planned f/u cards scheduled 05/16/2018. ?esophageal spasm in h/o HH. He was treated with SL nitro as needed. Since home, has not needed SL nitro. Overall feeling well.   SO endorses hacking cough, uses several sugarless cough drops a day.   Has established with [facility] Dr Manuella Ghazi [facility]) for his left posterior insular cortex and superior temporal gyrus cortex nonhemorrhagic infarcts that presented with aphasia. Memory has worsened after stroke. Neck MRA showed occluded L ICA. On plavix and aspirin after stroke. Vascular dementia - namenda increased to 5mg  bid. For chronic insomnia with OSA - CPAP was continued (he only uses a few hours a night), discussed lunesta taper and started on klonopin 0.5mg  nightly. MMSE at that time 11/30.   Bilateral carotid stenosis with complete occlusion of LICA - pending [facility] appt tomorrow.   Today confirmed patient is DNR.   DATE OF ADMISSION:  04/26/2018  DATE OF DISCHARGE:  04/28/18 TCM phone call completed 04/29/2018.  D/C diagnosis: Active Problems:   Chest pain   Relevant past medical, surgical, family and social history reviewed and updated as indicated. Interim medical history since our last visit reviewed. Allergies and medications reviewed and updated. Outpatient Medications Prior to Visit  Medication Sig Dispense Refill  . aspirin EC 81  MG tablet Take 81 mg by mouth every morning.    . clonazePAM (KLONOPIN) 0.5 MG tablet Take 0.5 mg by mouth at bedtime.    . clopidogrel (PLAVIX) 75 MG tablet Take 1 tablet (75 mg total) by mouth daily. (Patient taking differently: Take 75 mg by mouth at bedtime. ) 30 tablet 2  . doxazosin (CARDURA) 1 MG tablet TAKE 1 TABLET BY MOUTH EVERY DAY 30 tablet 11  . eszopiclone (LUNESTA) 2 MG TABS tablet Take 1 tablet (2 mg total) by mouth at bedtime as needed for sleep. Take immediately before bedtime 30 tablet 5  . finasteride (PROSCAR) 5 MG tablet Take 5 mg by mouth at bedtime.    . memantine (NAMENDA) 5 MG tablet Take 1 tablet (5 mg total) by mouth 2 (two) times daily. 60 tablet 6  . metFORMIN (GLUCOPHAGE) 500 MG tablet TAKE 1 TABLET (500 MG TOTAL) BY MOUTH 2 (TWO) TIMES DAILY WITH A MEAL. 180 tablet 1  . Multiple Vitamin (MULTIVITAMIN WITH MINERALS) TABS tablet Take 1 tablet by mouth daily.    . naproxen sodium (ANAPROX) 220 MG tablet Take 220 mg by mouth daily as needed (pain).    . nitroGLYCERIN (NITROSTAT) 0.4 MG SL tablet Place 1 tablet (0.4 mg total) under the tongue every 5 (five) minutes as needed for chest pain. 30 tablet 1  . omeprazole (PRILOSEC) 20 MG capsule Take 1 capsule (20 mg total) by mouth 2 (two) times daily before a meal. 60 capsule 1  .  pravastatin (PRAVACHOL) 40 MG tablet TAKE 1 TABLET (40 MG TOTAL) BY MOUTH DAILY. 90 tablet 3  . rOPINIRole (REQUIP) 0.25 MG tablet Take 1 tablet (0.25 mg total) by mouth at bedtime. 90 tablet 2  . vitamin B-12 (CYANOCOBALAMIN) 500 MCG tablet Take 500 mcg by mouth daily.     No facility-administered medications prior to visit.      Per HPI unless specifically indicated in ROS section below Review of Systems     Objective:    BP 118/70 (BP Location: Left Arm, Patient Position: Sitting, Cuff Size: Normal)   Pulse 82   Temp 98.1 F (36.7 C) (Oral)   Ht 5\' 7"  (1.702 m)   Wt 188 lb 8 oz (85.5 kg)   SpO2 98%   BMI 29.52 kg/m   Wt Readings  from Last 3 Encounters:  05/05/18 188 lb 8 oz (85.5 kg)  04/28/18 184 lb 6.4 oz (83.6 kg)  03/26/18 187 lb 4 oz (84.9 kg)    Physical Exam  Constitutional: He appears well-developed and well-nourished. No distress.  HENT:  Mouth/Throat: Oropharynx is clear and moist. No oropharyngeal exudate.  Eyes: Pupils are equal, round, and reactive to light. EOM are normal.  Cardiovascular: Normal rate, regular rhythm and intact distal pulses.  Murmur (3/6 systolic) heard. Significant bowel sounds present while auscultating heart  Pulmonary/Chest: Effort normal and breath sounds normal. No respiratory distress. He has no wheezes. He has no rales.  Abdominal: Soft. Bowel sounds are normal. He exhibits no distension and no mass. There is no tenderness. There is no rebound and no guarding. No hernia.  Musculoskeletal: He exhibits no edema.  Nursing note and vitals reviewed.  Results for orders placed or performed during the hospital encounter of 04/26/18  MRSA PCR Screening  Result Value Ref Range   MRSA by PCR NEGATIVE NEGATIVE  NM Myocar Multi W/Spect W/Wall Motion / EF  Result Value Ref Range   Rest HR 75 bpm   Rest BP 101/62 mmHg   Exercise duration (sec) 0 sec   Percent HR 68 %   Exercise duration (min) 0 min   Estimated workload 1.0 METS   Peak HR 91 bpm   Peak BP 91/54 mmHg   MPHR 132 bpm   SSS 9    SRS 12    SDS 0    TID 1.10    LV sys vol 44 mL   LV dias vol 80 62 - 150 mL  Basic metabolic panel  Result Value Ref Range   Sodium 136 135 - 145 mmol/L   Potassium 4.1 3.5 - 5.1 mmol/L   Chloride 102 101 - 111 mmol/L   CO2 25 22 - 32 mmol/L   Glucose, Bld 156 (H) 65 - 99 mg/dL   BUN 19 6 - 20 mg/dL   Creatinine, Ser 1.13 0.61 - 1.24 mg/dL   Calcium 9.2 8.9 - 10.3 mg/dL   GFR calc non Af Amer 56 (L) >60 mL/min   GFR calc Af Amer >60 >60 mL/min   Anion gap 9 5 - 15  CBC  Result Value Ref Range   WBC 12.4 (H) 3.8 - 10.6 K/uL   RBC 4.39 (L) 4.40 - 5.90 MIL/uL   Hemoglobin  13.0 13.0 - 18.0 g/dL   HCT 38.9 (L) 40.0 - 52.0 %   MCV 88.5 80.0 - 100.0 fL   MCH 29.6 26.0 - 34.0 pg   MCHC 33.4 32.0 - 36.0 g/dL   RDW 14.9 (H) 11.5 -  14.5 %   Platelets 281 150 - 440 K/uL  Troponin I  Result Value Ref Range   Troponin I <0.03 <0.03 ng/mL  Protime-INR  Result Value Ref Range   Prothrombin Time 13.7 11.4 - 15.2 seconds   INR 1.06   TSH  Result Value Ref Range   TSH 0.747 0.350 - 4.500 uIU/mL  Troponin I  Result Value Ref Range   Troponin I <0.03 <0.03 ng/mL  Troponin I  Result Value Ref Range   Troponin I <0.03 <0.03 ng/mL  Troponin I  Result Value Ref Range   Troponin I <0.03 <0.03 ng/mL  Basic metabolic panel  Result Value Ref Range   Sodium 138 135 - 145 mmol/L   Potassium 3.6 3.5 - 5.1 mmol/L   Chloride 106 101 - 111 mmol/L   CO2 23 22 - 32 mmol/L   Glucose, Bld 128 (H) 65 - 99 mg/dL   BUN 13 6 - 20 mg/dL   Creatinine, Ser 1.08 0.61 - 1.24 mg/dL   Calcium 8.5 (L) 8.9 - 10.3 mg/dL   GFR calc non Af Amer 59 (L) >60 mL/min   GFR calc Af Amer >60 >60 mL/min   Anion gap 9 5 - 15      Assessment & Plan:   Problem List Items Addressed This Visit    Vascular dementia    Progressive, worsened after CVA. Has established with neurology, who has increased namenda dose to BID. MMSE deteriorated from 24 --->11/30. Fortunately no change in level of care need at this time.       Systolic murmur    Recent echo unrevealing.       Insomnia    Chronic. Tapering off lunesta, klonopin has been started - seems effective at this time.       Hiatal hernia    Marked bowel sounds on auscultation of heart today. Pt doing well with BID PPI dosing.       DNR (do not resuscitate)    This was confirmed during office visit today.       CVA (cerebral vascular accident) ([place])    Now on plavix and aspirin, followed by neurology. Has been referred to [facility] to discuss treatment options for carotid stenosis.       Complex sleep apnea syndrome    Appreciate neuro  sleep care ([facility]).       Chest pain - Primary    Presumed esophageal spasm. Now on omeprazole 20mg  BID dosing, has SL nitro PRN - has not needed. Overall stable period since hospitalization. Has f/u planned with cards in 2 wks.      Aphasia due to acute stroke [facility])    Continues working with speech therapy.          No orders of the defined types were placed in this encounter.  No orders of the defined types were placed in this encounter.   Follow up plan: Return if symptoms worsen or fail to improve.  Ria Bush, MD

## 2018-05-05 NOTE — Assessment & Plan Note (Signed)
Chronic. Tapering off lunesta, klonopin has been started - seems effective at this time.

## 2018-05-05 NOTE — Assessment & Plan Note (Addendum)
Presumed esophageal spasm. Now on omeprazole 20mg  BID dosing, has SL nitro PRN - has not needed. Overall stable period since hospitalization. Has f/u planned with cards in 2 wks.

## 2018-05-05 NOTE — Assessment & Plan Note (Signed)
Now on plavix and aspirin, followed by neurology. Has been referred to [facility] to discuss treatment options for carotid stenosis.

## 2018-05-05 NOTE — Assessment & Plan Note (Signed)
Recent echo unrevealing.

## 2018-05-06 ENCOUNTER — Ambulatory Visit (INDEPENDENT_AMBULATORY_CARE_PROVIDER_SITE_OTHER): Payer: Medicare Other | Admitting: Vascular Surgery

## 2018-05-06 ENCOUNTER — Encounter: Payer: Self-pay | Admitting: Family Medicine

## 2018-05-06 ENCOUNTER — Encounter (INDEPENDENT_AMBULATORY_CARE_PROVIDER_SITE_OTHER): Payer: Self-pay | Admitting: Vascular Surgery

## 2018-05-06 VITALS — BP 111/74 | HR 78 | Resp 16 | Ht 68.0 in | Wt 189.0 lb

## 2018-05-06 DIAGNOSIS — I639 Cerebral infarction, unspecified: Secondary | ICD-10-CM

## 2018-05-06 DIAGNOSIS — E1122 Type 2 diabetes mellitus with diabetic chronic kidney disease: Secondary | ICD-10-CM

## 2018-05-06 DIAGNOSIS — I771 Stricture of artery: Secondary | ICD-10-CM

## 2018-05-06 DIAGNOSIS — I25119 Atherosclerotic heart disease of native coronary artery with unspecified angina pectoris: Secondary | ICD-10-CM

## 2018-05-06 DIAGNOSIS — I69320 Aphasia following cerebral infarction: Secondary | ICD-10-CM | POA: Diagnosis not present

## 2018-05-06 DIAGNOSIS — N181 Chronic kidney disease, stage 1: Secondary | ICD-10-CM

## 2018-05-06 DIAGNOSIS — I63239 Cerebral infarction due to unspecified occlusion or stenosis of unspecified carotid arteries: Secondary | ICD-10-CM

## 2018-05-06 DIAGNOSIS — E7800 Pure hypercholesterolemia, unspecified: Secondary | ICD-10-CM

## 2018-05-06 MED ORDER — CLOPIDOGREL BISULFATE 75 MG PO TABS: 75.0000 mg | ORAL_TABLET | Freq: Every day | ORAL | 1 refills | Status: DC

## 2018-05-06 MED ORDER — PANTOPRAZOLE SODIUM 40 MG PO TBEC: 40.0000 mg | DELAYED_RELEASE_TABLET | Freq: Every day | ORAL | 3 refills | Status: DC

## 2018-05-06 NOTE — Assessment & Plan Note (Signed)
Likely from carotid disease.  See work-up as below.

## 2018-05-06 NOTE — Patient Instructions (Signed)
Carotid Artery Disease The carotid arteries are arteries on both sides of the neck. They carry blood to the brain. Carotid artery disease is when the arteries get smaller (narrow) or get blocked. If these arteries get smaller or get blocked, you are more likely to have a stroke or warning stroke (transient ischemic attack). Follow these instructions at home:  Take medicines as told by your doctor. Make sure you understand all your medicine instructions. Do not stop your medicines without talking to your doctor first.  Follow your doctor's diet instructions. It is important to eat a healthy diet that includes plenty of: ? Fresh fruits. ? Vegetables. ? Lean meats.  Avoid: ? High-fat foods. ? High-sodium foods. ? Foods that are fried, overly processed, or have poor nutritional value.  Stay a healthy weight.  Stay active. Get at least 30 minutes of activity every day.  Do not smoke.  Limit alcohol use to: ? No more than 2 drinks a day for men. ? No more than 1 drink a day for women who are not pregnant.  Do not use illegal drugs.  Keep all doctor visits as told. Get help right away if:  You have sudden weakness or loss of feeling (numbness) on one side of the body, such as the face, arm, or leg.  You have sudden confusion.  You have trouble speaking (aphasia) or understanding.  You have sudden trouble seeing out of one or both eyes.  You have sudden trouble walking.  You have dizziness or feel like you might pass out (faint).  You have a loss of balance or your movements are not steady (uncoordinated).  You have a sudden, severe headache with no known cause.  You have trouble swallowing (dysphagia). Call your local emergency services (911 in [place] Do notdrive yourself to the clinic or hospital. This information is not intended to replace advice given to you by your health care provider. Make sure you discuss any questions you have with your health care  provider. Document Released: 10/15/2012 Document Revised: 04/05/2016 Document Reviewed: 04/29/2013 [employer] Interactive Patient Education  2018 [employer]  

## 2018-05-06 NOTE — Assessment & Plan Note (Signed)
To see his cardiologist next week.  Would need a cardiac assessment if surgery is considered.

## 2018-05-06 NOTE — Assessment & Plan Note (Signed)
blood glucose control important in reducing the progression of atherosclerotic disease. Also, involved in wound healing. On appropriate medications.  

## 2018-05-06 NOTE — Assessment & Plan Note (Signed)
lipid control important in reducing the progression of atherosclerotic disease. Continue statin therapy  

## 2018-05-06 NOTE — Progress Notes (Signed)
Patient ID: Stephen [name], male   DOB: Mar 09, 1927, 84 y.o.   MRN: [id number]  Chief Complaint  Patient presents with  . New Patient (Initial Visit)    Carotid stenosis    HPI Stephen [name] is a 84 y.o. male.  I am asked to see the patient by Dr. Manuella Ghazi for evaluation of carotid stenosis.  The patient reports a little over a month ago, he had an episode of facial droop and arm weakness which resolved over a day or twos time.  He was found to have a small stroke.  As part of his work-up, he had a carotid duplex which I have reviewed which suggested a left carotid occlusion and a high-grade stenosis of the right carotid artery.  He also had an MRI which demonstrated multiple small previous strokes.  His family reports worsening cognition and memory over a couple of years time.  This is been gradual in its onset.  There was no clear inciting event or causative factor that started his symptoms.  He is referred for further evaluation and treatment.   Past Medical History:  Diagnosis Date  . Arthritis of knee   . BPH (benign prostatic hypertrophy)    w/ nocturia  . Coronary artery disease 03/2014   Inferior ST elevation myocardial infarction. Cardiac catheterization showed an occluded mid RCA. He had an angioplasty and drug-eluting stent placement with a 3.0 x 16 mm Promus drug-eluting stent. Ejection fraction was 45% by echo, completed cardiac rehab 06/2014  . Diabetes type 2, controlled ([place])   . History of basal cell cancer    s/p mohs  . History of colon polyps   . History of hypertension   . HLD (hyperlipidemia)    diet controlled in past  . Hypertension   . Insomnia    treated with multiple meds in past  . MI (myocardial infarction) ([place])   . Rosacea   . Skin cancer   . UTI (urinary tract infection)     Past Surgical History:  Procedure Laterality Date  . CARDIAC CATHETERIZATION  03/2014   [facility];x1 stent  . CATARACT EXTRACTION W/PHACO Left 10/09/2016   Procedure: CATARACT  EXTRACTION PHACO AND INTRAOCULAR LENS PLACEMENT (IOC);  Surgeon: Birder Robson, MD;  Location: [facility] ORS;  Service: Ophthalmology;  Laterality: Left;  [place] 1.13 AP% 18.3 CDE 13.45 Fluid pack lot # [id number]  . COLONOSCOPY  08/2010   hyperplastic polyp, rec rpt 5 yrs  . ESOPHAGOGASTRODUODENOSCOPY  05/2015   dilated stricture, normal biopsies, HH, no definite infection [name] @ [facility])  . MOHS SURGERY     basal cell chin/back  . REPLACEMENT TOTAL KNEE Left 08/2006  . VASECTOMY  1970    Family History  Problem Relation Age of Onset  . Cancer Father 65       prostate and colon  . Cancer Mother        ovarian or uterine  . CAD Brother 14       MI, smoker  . Parkinson's disease Brother   . COPD Brother   . CAD Brother        MI  . Stroke Sister      Social History Social History   Tobacco Use  . Smoking status: Former Smoker    Last attempt to quit: 11/12/1962    Years since quitting: 55.5  . Smokeless tobacco: Never Used  . Tobacco comment: Quit 1964  Substance Use Topics  . Alcohol use: Yes  Alcohol/week: 0.6 oz    Types: 1 Cans of beer per week  . Drug use: No     Allergies  Allergen Reactions  . Ambien [Zolpidem Tartrate]     unknown  . Aricept [Donepezil Hcl] Diarrhea  . Atorvastatin     muscle aches  . Belsomra [Suvorexant] Other (See Comments)    anxiety  . Penicillins Hives    Has patient had a PCN reaction causing immediate rash, facial/tongue/throat swelling, SOB or lightheadedness with hypotension: Yes Has patient had a PCN reaction causing severe rash involving mucus membranes or skin necrosis: Yes Has patient had a PCN reaction that required hospitalization No Has patient had a PCN reaction occurring within the last 10 years: No If all of the above answers are "NO", then may proceed with Cephalosporin use.   . Tamsulosin Other (See Comments)    Leg weakness  . Doxycycline Rash    Current Outpatient Medications  Medication Sig Dispense Refill  .  aspirin EC 81 MG tablet Take 81 mg by mouth every morning.    . clonazePAM (KLONOPIN) 0.5 MG tablet Take 0.5 mg by mouth at bedtime.    . clopidogrel (PLAVIX) 75 MG tablet Take 1 tablet (75 mg total) by mouth daily. (Patient taking differently: Take 75 mg by mouth at bedtime. ) 30 tablet 2  . doxazosin (CARDURA) 1 MG tablet TAKE 1 TABLET BY MOUTH EVERY DAY 30 tablet 11  . eszopiclone (LUNESTA) 2 MG TABS tablet Take 1 tablet (2 mg total) by mouth at bedtime as needed for sleep. Take immediately before bedtime 30 tablet 5  . finasteride (PROSCAR) 5 MG tablet Take 5 mg by mouth at bedtime.    . memantine (NAMENDA) 5 MG tablet Take 1 tablet (5 mg total) by mouth 2 (two) times daily. 60 tablet 6  . metFORMIN (GLUCOPHAGE) 500 MG tablet TAKE 1 TABLET (500 MG TOTAL) BY MOUTH 2 (TWO) TIMES DAILY WITH A MEAL. 180 tablet 1  . Multiple Vitamin (MULTIVITAMIN WITH MINERALS) TABS tablet Take 1 tablet by mouth daily.    . naproxen sodium (ANAPROX) 220 MG tablet Take 220 mg by mouth daily as needed (pain).    . nitroGLYCERIN (NITROSTAT) 0.4 MG SL tablet Place 1 tablet (0.4 mg total) under the tongue every 5 (five) minutes as needed for chest pain. 30 tablet 1  . omeprazole (PRILOSEC) 20 MG capsule Take 1 capsule (20 mg total) by mouth 2 (two) times daily before a meal. 60 capsule 1  . pravastatin (PRAVACHOL) 40 MG tablet TAKE 1 TABLET (40 MG TOTAL) BY MOUTH DAILY. 90 tablet 3  . rOPINIRole (REQUIP) 0.25 MG tablet Take 1 tablet (0.25 mg total) by mouth at bedtime. 90 tablet 2  . vitamin B-12 (CYANOCOBALAMIN) 500 MCG tablet Take 500 mcg by mouth daily.     No current facility-administered medications for this visit.       REVIEW OF SYSTEMS (Negative unless checked)  Constitutional: [] Weight loss  [] Fever  [] Chills Cardiac: [] Chest pain   [] Chest pressure   [] Palpitations   [] Shortness of breath when laying flat   [] Shortness of breath at rest   [] Shortness of breath with exertion. Vascular:  [] Pain in legs  with walking   [] Pain in legs at rest   [] Pain in legs when laying flat   [] Claudication   [] Pain in feet when walking  [] Pain in feet at rest  [] Pain in feet when laying flat   [] History of DVT   [] Phlebitis   []   Swelling in legs   [] Varicose veins   [] Non-healing ulcers Pulmonary:   [] Uses home oxygen   [] Productive cough   [] Hemoptysis   [] Wheeze  [] COPD   [] Asthma Neurologic:  [] Dizziness  [] Blackouts   [] Seizures   [x] History of stroke   [x] History of TIA  [] Aphasia   [] Temporary blindness   [] Dysphagia   [] Weakness or numbness in arms   [] Weakness or numbness in legs Musculoskeletal:  [x] Arthritis   [] Joint swelling   [x] Joint pain   [] Low back pain Hematologic:  [] Easy bruising  [] Easy bleeding   [] Hypercoagulable state   [] Anemic  [] Hepatitis Gastrointestinal:  [] Blood in stool   [] Vomiting blood  [x] Gastroesophageal reflux/heartburn   [] Abdominal pain Genitourinary:  [] Chronic kidney disease   [] Difficult urination  [x] Frequent urination  [] Burning with urination   [] Hematuria Skin:  [] Rashes   [] Ulcers   [] Wounds Psychological:  [] History of anxiety   []  History of major depression.    Physical Exam BP 111/74 (BP Location: Right Arm, Patient Position: Sitting)   Pulse 78   Resp 16   Ht 5' 8"  (1.727 m)   Wt 189 lb (85.7 kg)   BMI 28.74 kg/m  Gen:  WD/WN, NAD.  Appears younger than stated age Head: [place]/AT, No temporalis wasting. Ear/Nose/Throat: Hearing grossly intact, nares w/o erythema or drainage, oropharynx w/o Erythema/Exudate Eyes: Conjunctiva clear, sclera non-icteric  Neck: trachea midline.  Bilateral carotid bruits Pulmonary:  Good air movement, clear to auscultation bilaterally.  Cardiac: RRR, no JVD Vascular:  Vessel Right Left  Radial Palpable Palpable                          PT Palpable Palpable  DP Palpable Palpable   Gastrointestinal: soft, non-tender/non-distended.  Musculoskeletal: M/S 5/5 throughout.  Extremities without ischemic changes.  No deformity  or atrophy.  Trace lower extremity edema. Neurologic: Sensation grossly intact in extremities.  Symmetrical.  Speech is fluent. Motor exam as listed above. Psychiatric: Judgment intact, Mood & affect appropriate for pt's clinical situation. Dermatologic: No rashes or ulcers noted.  No cellulitis or open wounds.  Radiology Dg Chest 1 View  Result Date: 04/26/2018 CLINICAL DATA:  Acute onset of chest pain. EXAM: CHEST  1 VIEW COMPARISON:  None. FINDINGS: Mild cardiomegaly and tortuous thoracic aorta. Retrocardiac hiatal hernia. Mild interstitial coarsening. No frank pulmonary edema. Suspect small pleural effusions incompletely assessed on portable AP view. No focal airspace disease. No pneumothorax. Chronic change of both shoulders. IMPRESSION: Mild cardiomegaly and small suspected pleural effusions. Hiatal hernia. Electronically Signed   By: Jeb Levering M.D.   On: 04/26/2018 23:48   Nm Myocar Multi W/spect W/wall Motion / Ef  Result Date: 04/28/2018 Pharmacological myocardial perfusion imaging study with no significant  Ischemia Non-attenuation corrected images with fixed inferior wall defect Attenuation corrected images with significant GI uptake artifact limiting appropriate review of the inferior wall, otherwise no ischemia Inferior wall hypokinesis,  EF estimated at 87% No EKG changes concerning for ischemia at peak stress or in recovery. Low risk scan Clinical correlation recommended, given his GI uptake artifact,  if he has anginal symptoms, consider alternate study such as cardiac CTA Signed, Esmond Plants, MD, Ph.D [facility]    Labs Recent Results (from the past 2160 hour(s))  Comprehensive metabolic panel     Status: Abnormal   Collection Time: 03/15/18  8:53 AM  Result Value Ref Range   Sodium 135 135 - 145 mmol/L   Potassium 4.2 3.5 -  5.1 mmol/L   Chloride 104 101 - 111 mmol/L   CO2 23 22 - 32 mmol/L   Glucose, Bld 196 (H) 65 - 99 mg/dL   BUN 14 6 - 20 mg/dL   Creatinine,  Ser 1.12 0.61 - 1.24 mg/dL   Calcium 8.7 (L) 8.9 - 10.3 mg/dL   Total Protein 7.0 6.5 - 8.1 g/dL   Albumin 3.7 3.5 - 5.0 g/dL   AST 30 15 - 41 U/L   ALT 14 (L) 17 - 63 U/L   Alkaline Phosphatase 55 38 - 126 U/L   Total Bilirubin 0.6 0.3 - 1.2 mg/dL   GFR calc non Af Amer 57 (L) >60 mL/min   GFR calc Af Amer >60 >60 mL/min    Comment: (NOTE) The eGFR has been calculated using the CKD EPI equation. This calculation has not been validated in all clinical situations. eGFR's persistently <60 mL/min signify possible Chronic Kidney Disease.    Anion gap 8 5 - 15    Comment: Performed at [facility], [address] [place], [place] [address]  CBC     Status: Abnormal   Collection Time: 03/15/18  8:53 AM  Result Value Ref Range   WBC 7.2 3.8 - 10.6 K/uL   RBC 4.21 (L) 4.40 - 5.90 MIL/uL   Hemoglobin 12.4 (L) 13.0 - 18.0 g/dL   HCT 37.9 (L) 40.0 - 52.0 %   MCV 90.0 80.0 - 100.0 fL   MCH 29.5 26.0 - 34.0 pg   MCHC 32.7 32.0 - 36.0 g/dL   RDW 14.7 (H) 11.5 - 14.5 %   Platelets 297 150 - 440 K/uL    Comment: Performed at [facility], [address] [place], [place] [address]  Differential     Status: None   Collection Time: 03/15/18  8:53 AM  Result Value Ref Range   Neutrophils Relative % 65 %   Neutro Abs 4.7 1.4 - 6.5 K/uL   Lymphocytes Relative 23 %   Lymphs Abs 1.6 1.0 - 3.6 K/uL   Monocytes Relative 8 %   Monocytes Absolute 0.6 0.2 - 1.0 K/uL   Eosinophils Relative 3 %   Eosinophils Absolute 0.2 0 - 0.7 K/uL   Basophils Relative 1 %   Basophils Absolute 0.1 0 - 0.1 K/uL    Comment: Performed at [facility], [address] [place], [place] [address]  Troponin I     Status: None   Collection Time: 03/15/18  8:53 AM  Result Value Ref Range   Troponin I <0.03 <0.03 ng/mL    Comment: Performed at [facility], [address] [place], [place] [address]  Urine Drug Screen, Qualitative     Status: None   Collection Time: 03/15/18   9:31 AM  Result Value Ref Range   Tricyclic, Ur Screen NONE DETECTED NONE DETECTED   Amphetamines, Ur Screen NONE DETECTED NONE DETECTED   MDMA (Ecstasy)Ur Screen NONE DETECTED NONE DETECTED   Cocaine Metabolite,Ur [place] NONE DETECTED NONE DETECTED   Opiate, Ur Screen NONE DETECTED NONE DETECTED   Phencyclidine (PCP) Ur S NONE DETECTED NONE DETECTED   Cannabinoid 50 Ng, Ur [place] NONE DETECTED NONE DETECTED   Barbiturates, Ur Screen NONE DETECTED NONE DETECTED   Benzodiazepine, Ur Scrn NONE DETECTED NONE DETECTED   Methadone Scn, Ur NONE DETECTED NONE DETECTED    Comment: (NOTE) Tricyclics + metabolites, urine    Cutoff 1000 ng/mL Amphetamines + metabolites, urine  Cutoff 1000 ng/mL MDMA (Ecstasy),  urine              Cutoff 500 ng/mL Cocaine Metabolite, urine          Cutoff 300 ng/mL Opiate + metabolites, urine        Cutoff 300 ng/mL Phencyclidine (PCP), urine         Cutoff 25 ng/mL Cannabinoid, urine                 Cutoff 50 ng/mL Barbiturates + metabolites, urine  Cutoff 200 ng/mL Benzodiazepine, urine              Cutoff 200 ng/mL Methadone, urine                   Cutoff 300 ng/mL The urine drug screen provides only a preliminary, unconfirmed analytical test result and should not be used for non-medical purposes. Clinical consideration and professional judgment should be applied to any positive drug screen result due to possible interfering substances. A more specific alternate chemical method must be used in order to obtain a confirmed analytical result. Gas chromatography / mass spectrometry (GC/MS) is the preferred confirmat ory method. Performed at [facility], [address] [place], [place] [address]   Urinalysis, Routine w reflex microscopic     Status: Abnormal   Collection Time: 03/15/18  9:31 AM  Result Value Ref Range   Color, Urine YELLOW (A) YELLOW   APPearance CLEAR (A) CLEAR   Specific Gravity, Urine 1.015 1.005 - 1.030   pH 5.0 5.0 - 8.0    Glucose, UA NEGATIVE NEGATIVE mg/dL   Hgb urine dipstick NEGATIVE NEGATIVE   Bilirubin Urine NEGATIVE NEGATIVE   Ketones, ur NEGATIVE NEGATIVE mg/dL   Protein, ur NEGATIVE NEGATIVE mg/dL   Nitrite NEGATIVE NEGATIVE   Leukocytes, UA NEGATIVE NEGATIVE    Comment: Performed at [facility], [address], [place], [place] [address]  Ethanol     Status: None   Collection Time: 03/15/18  9:31 AM  Result Value Ref Range   Alcohol, Ethyl (B) <10 <10 mg/dL    Comment:        LOWEST DETECTABLE LIMIT FOR SERUM ALCOHOL IS 10 mg/dL FOR MEDICAL PURPOSES ONLY Performed at [facility], [address] [place], [place] [address]   Protime-INR     Status: None   Collection Time: 03/15/18  9:31 AM  Result Value Ref Range   Prothrombin Time 13.6 11.4 - 15.2 seconds   INR 1.05     Comment: Performed at [facility], [address] [place],  [address]  APTT     Status: None   Collection Time: 03/15/18  9:31 AM  Result Value Ref Range   aPTT 33 24 - 36 seconds    Comment: Performed at [facility], [place], [place] [address]  Glucose, capillary     Status: Abnormal   Collection Time: 03/15/18  8:47 PM  Result Value Ref Range   Glucose-Capillary 107 (H) 65 - 99 mg/dL  Hemoglobin A1c     Status: Abnormal   Collection Time: 03/16/18  4:12 AM  Result Value Ref Range   Hgb A1c MFr Bld 6.6 (H) 4.8 - 5.6 %    Comment: (NOTE) Pre diabetes:          5.7%-6.4% Diabetes:              >6.4% Glycemic control for   <7.0% adults with diabetes    Mean Plasma  Glucose 142.72 mg/dL    Comment: Performed at [facility], [place] [address] [place], [place] [address]  Lipid panel     Status: None   Collection Time: 03/16/18  4:12 AM  Result Value Ref Range   Cholesterol 126 0 - 200 mg/dL   Triglycerides 135 <150 mg/dL   HDL 53 >40 mg/dL   Total CHOL/HDL Ratio 2.4 RATIO   VLDL 27 0 - 40 mg/dL   LDL Cholesterol 46 0 - 99 mg/dL    Comment:         Total Cholesterol/HDL:CHD Risk Coronary Heart Disease Risk Table                     Men   Women  1/2 Average Risk   3.4   3.3  Average Risk       5.0   4.4  2 X Average Risk   9.6   7.1  3 X Average Risk  23.4   11.0        Use the calculated Patient Ratio above and the CHD Risk Table to determine the patient's CHD Risk.        ATP III CLASSIFICATION (LDL):  <100     mg/dL   Optimal  100-129  mg/dL   Near or Above                    Optimal  130-159  mg/dL   Borderline  160-189  mg/dL   High  >190     mg/dL   Very High Performed at [facility], [address] [place], [place] [address]   Glucose, capillary     Status: Abnormal   Collection Time: 03/16/18  8:02 AM  Result Value Ref Range   Glucose-Capillary 104 (H) 65 - 99 mg/dL  Glucose, capillary     Status: Abnormal   Collection Time: 03/16/18 11:57 AM  Result Value Ref Range   Glucose-Capillary 122 (H) 65 - 99 mg/dL  ECHOCARDIOGRAM COMPLETE     Status: None   Collection Time: 03/16/18  2:42 PM  Result Value Ref Range   Weight 3,078.4 oz   Height 67 in   BP 100/65 mmHg  Glucose, capillary     Status: Abnormal   Collection Time: 03/16/18  5:09 PM  Result Value Ref Range   Glucose-Capillary 122 (H) 65 - 99 mg/dL  Glucose, capillary     Status: Abnormal   Collection Time: 03/16/18  8:45 PM  Result Value Ref Range   Glucose-Capillary 162 (H) 65 - 99 mg/dL  Glucose, capillary     Status: Abnormal   Collection Time: 03/17/18  7:49 AM  Result Value Ref Range   Glucose-Capillary 113 (H) 65 - 99 mg/dL  Glucose, capillary     Status: Abnormal   Collection Time: 03/17/18 12:08 PM  Result Value Ref Range   Glucose-Capillary 130 (H) 65 - 99 mg/dL  TB Skin Test     Status: None   Collection Time: 04/24/18  3:25 PM  Result Value Ref Range   TB Skin Test Negative    Induration 0 mm  Basic metabolic panel     Status: Abnormal   Collection Time: 04/26/18 10:31 PM  Result Value Ref Range   Sodium 136 135  - 145 mmol/L   Potassium 4.1 3.5 - 5.1 mmol/L   Chloride 102 101 - 111 mmol/L   CO2 25 22 - 32 mmol/L   Glucose,  Bld 156 (H) 65 - 99 mg/dL   BUN 19 6 - 20 mg/dL   Creatinine, Ser 1.13 0.61 - 1.24 mg/dL   Calcium 9.2 8.9 - 10.3 mg/dL   GFR calc non Af Amer 56 (L) >60 mL/min   GFR calc Af Amer >60 >60 mL/min    Comment: (NOTE) The eGFR has been calculated using the CKD EPI equation. This calculation has not been validated in all clinical situations. eGFR's persistently <60 mL/min signify possible Chronic Kidney Disease.    Anion gap 9 5 - 15    Comment: Performed at [facility], [address] [place], [place] [address]  CBC     Status: Abnormal   Collection Time: 04/26/18 10:31 PM  Result Value Ref Range   WBC 12.4 (H) 3.8 - 10.6 K/uL   RBC 4.39 (L) 4.40 - 5.90 MIL/uL   Hemoglobin 13.0 13.0 - 18.0 g/dL   HCT 38.9 (L) 40.0 - 52.0 %   MCV 88.5 80.0 - 100.0 fL   MCH 29.6 26.0 - 34.0 pg   MCHC 33.4 32.0 - 36.0 g/dL   RDW 14.9 (H) 11.5 - 14.5 %   Platelets 281 150 - 440 K/uL    Comment: Performed at [facility], [address] [place], [place] [address]  Troponin I     Status: None   Collection Time: 04/26/18 10:31 PM  Result Value Ref Range   Troponin I <0.03 <0.03 ng/mL    Comment: Performed at [facility], [address] [place], [place] [address]  Protime-INR     Status: None   Collection Time: 04/26/18 10:31 PM  Result Value Ref Range   Prothrombin Time 13.7 11.4 - 15.2 seconds   INR 1.06     Comment: Performed at [facility], [address] [place], [place] [address]  MRSA PCR Screening     Status: None   Collection Time: 04/27/18  2:00 AM  Result Value Ref Range   MRSA by PCR NEGATIVE NEGATIVE    Comment:        The GeneXpert MRSA Assay (FDA approved for NASAL specimens only), is one component of a comprehensive MRSA colonization surveillance program. It is not intended to diagnose MRSA infection nor to guide  or monitor treatment for MRSA infections. Performed at [facility], [address] [place], [place] [address]   TSH     Status: None   Collection Time: 04/27/18  5:24 AM  Result Value Ref Range   TSH 0.747 0.350 - 4.500 uIU/mL    Comment: Performed by a 3rd Generation assay with a functional sensitivity of <=0.01 uIU/mL. Performed at [facility], [address] [place], [place] [address]   Troponin I     Status: None   Collection Time: 04/27/18  5:24 AM  Result Value Ref Range   Troponin I <0.03 <0.03 ng/mL    Comment: Performed at [facility], [address] [place], [place] [address]  Troponin I     Status: None   Collection Time: 04/27/18 11:31 AM  Result Value Ref Range   Troponin I <0.03 <0.03 ng/mL    Comment: Performed at [facility], [address] [place], [place] [address]  Troponin I     Status: None   Collection Time: 04/27/18  5:30 PM  Result Value Ref Range   Troponin I <0.03 <0.03 ng/mL    Comment: Performed at [facility], [place],  [place] [address]  Basic metabolic panel     Status: Abnormal   Collection Time: 04/28/18  4:28 AM  Result Value Ref Range   Sodium 138 135 - 145 mmol/L   Potassium 3.6 3.5 - 5.1 mmol/L   Chloride 106 101 - 111 mmol/L   CO2 23 22 - 32 mmol/L   Glucose, Bld 128 (H) 65 - 99 mg/dL   BUN 13 6 - 20 mg/dL   Creatinine, Ser 1.08 0.61 - 1.24 mg/dL   Calcium 8.5 (L) 8.9 - 10.3 mg/dL   GFR calc non Af Amer 59 (L) >60 mL/min   GFR calc Af Amer >60 >60 mL/min    Comment: (NOTE) The eGFR has been calculated using the CKD EPI equation. This calculation has not been validated in all clinical situations. eGFR's persistently <60 mL/min signify possible Chronic Kidney Disease.    Anion gap 9 5 - 15    Comment: Performed at [facility], [address] [place], [place] [address]  NM Myocar Multi W/Spect Tamela Oddi Motion / EF     Status: None   Collection Time:  04/28/18 10:59 AM  Result Value Ref Range   Rest HR 75 bpm   Rest BP 101/62 mmHg   Exercise duration (sec) 0 sec   Percent HR 68 %   Exercise duration (min) 0 min   Estimated workload 1.0 METS   Peak HR 91 bpm   Peak BP 91/54 mmHg   MPHR 132 bpm   SSS 9    SRS 12    SDS 0    TID 1.10    LV sys vol 44 mL   LV dias vol 80 62 - 150 mL    Assessment/Plan:  Coronary artery disease To see his cardiologist next week.  Would need a cardiac assessment if surgery is considered.  HLD (hyperlipidemia) lipid control important in reducing the progression of atherosclerotic disease. Continue statin therapy   Diabetes type 2, controlled blood glucose control important in reducing the progression of atherosclerotic disease. Also, involved in wound healing. On appropriate medications.   CVA (cerebral vascular accident) ([place]) Likely from carotid disease.  See work-up as below.  Carotid stenosis, symptomatic, with infarction [facility]) he had a carotid duplex which I have reviewed which suggested a left carotid occlusion and a high-grade stenosis of the right carotid artery.  He also had an MRI which demonstrated multiple small previous strokes. Given his advanced age and multiple medical issues, this is a difficult situation.  At this point, I would recommend a CT angiogram for further evaluation of his vascular anatomy to help determine whether or not surgery or stenting would be an option in his treatment.  If he does have a high-grade stenosis with a contralateral occlusion which is symptomatic, that will need to at least be considered.  This will be obtained in the near future at his convenience and I will see him back following the study to discuss the results and determine further treatment options.      Leotis Pain 05/06/2018, 11:19 AM   This note was created with Dragon medical transcription system.  Any errors from dictation are unintentional.

## 2018-05-06 NOTE — Assessment & Plan Note (Signed)
he had a carotid duplex which I have reviewed which suggested a left carotid occlusion and a high-grade stenosis of the right carotid artery.  He also had an MRI which demonstrated multiple small previous strokes. Given his advanced age and multiple medical issues, this is a difficult situation.  At this point, I would recommend a CT angiogram for further evaluation of his vascular anatomy to help determine whether or not surgery or stenting would be an option in his treatment.  If he does have a high-grade stenosis with a contralateral occlusion which is symptomatic, that will need to at least be considered.  This will be obtained in the near future at his convenience and I will see him back following the study to discuss the results and determine further treatment options.

## 2018-05-09 ENCOUNTER — Ambulatory Visit
Admission: RE | Admit: 2018-05-09 | Discharge: 2018-05-09 | Disposition: A | Discharge status: Home / Self Care | Payer: Medicare Other | Source: Ambulatory Visit | Attending: Vascular Surgery | Admitting: Vascular Surgery

## 2018-05-09 DIAGNOSIS — I771 Stricture of artery: Secondary | ICD-10-CM

## 2018-05-09 DIAGNOSIS — I69320 Aphasia following cerebral infarction: Secondary | ICD-10-CM | POA: Diagnosis not present

## 2018-05-09 DIAGNOSIS — I6523 Occlusion and stenosis of bilateral carotid arteries: Secondary | ICD-10-CM | POA: Diagnosis not present

## 2018-05-09 MED ORDER — IOHEXOL 350 MG/ML SOLN
75.0000 mL | Freq: Once | INTRAVENOUS | Status: AC | PRN
  Administered 2018-05-09: 75 mL via INTRAVENOUS

## 2018-05-12 DIAGNOSIS — Z8719 Personal history of other diseases of the digestive system: Secondary | ICD-10-CM

## 2018-05-12 HISTORY — PX: CAROTID PTA/STENT INTERVENTION: CATH118231

## 2018-05-12 HISTORY — DX: Personal history of other diseases of the digestive system: Z87.19

## 2018-05-13 DIAGNOSIS — I69320 Aphasia following cerebral infarction: Secondary | ICD-10-CM | POA: Diagnosis not present

## 2018-05-16 ENCOUNTER — Ambulatory Visit (INDEPENDENT_AMBULATORY_CARE_PROVIDER_SITE_OTHER): Payer: Medicare Other | Admitting: Cardiovascular Disease

## 2018-05-16 ENCOUNTER — Encounter: Payer: Self-pay | Admitting: Cardiovascular Disease

## 2018-05-16 VITALS — BP 94/62 | HR 81 | Ht 68.0 in | Wt 192.0 lb

## 2018-05-16 DIAGNOSIS — E785 Hyperlipidemia, unspecified: Secondary | ICD-10-CM

## 2018-05-16 DIAGNOSIS — I2510 Atherosclerotic heart disease of native coronary artery without angina pectoris: Secondary | ICD-10-CM | POA: Diagnosis not present

## 2018-05-16 DIAGNOSIS — I779 Disorder of arteries and arterioles, unspecified: Secondary | ICD-10-CM

## 2018-05-16 DIAGNOSIS — I739 Peripheral vascular disease, unspecified: Secondary | ICD-10-CM

## 2018-05-16 DIAGNOSIS — I69320 Aphasia following cerebral infarction: Secondary | ICD-10-CM | POA: Diagnosis not present

## 2018-05-16 DIAGNOSIS — I639 Cerebral infarction, unspecified: Secondary | ICD-10-CM | POA: Diagnosis not present

## 2018-05-16 NOTE — Patient Instructions (Addendum)
Follow-Up: Your physician wants you to follow-up in: 6 months with Dr. Fletcher Anon. You will receive a reminder letter in the mail two months in advance. If you don't receive a letter, please call our office to schedule the follow-up appointment.  It was a pleasure seeing you today here in the office. Please do not hesitate to give [place] a call back if you have any further questions. [place], BSN

## 2018-05-16 NOTE — Progress Notes (Signed)
Cardiology Office Note   Date:  05/16/2018   ID:  Stephen [name], DOB 10-May-1927, MRN [id number]  PCP:  Ria Bush, MD  Cardiologist:   Kathlyn Sacramento, MD   Chief Complaint  Patient presents with  . Other    12 month follow up. Patient denies chest pain and SOB.  Meds reviewed verbally with patient.       History of Present Illness: Stephen [name] is a 84 y.o. male who presents for a followup visit regarding coronary artery disease. He had inferior ST elevation myocardial infarction in May 2015. Emergent cardiac catheterization showed an occluded mid RCA which was treated successfully with PCI and drug-eluting stent placement. Ejection fraction was 45%. He has chronic medical conditions that include type 2 diabetes, hypertension and hyperlipidemia. He is a vegetarian since age 4 and lives at [facility]. He didn't tolerate Atorvastatin to myalgia.   He had some memory decline over the last 2 years and was started on Aricept. He was hospitalized in May of this year with a stroke.  He was found to have an occluded left carotid artery.  Echocardiogram showed normal LV systolic function with no significant valvular abnormalities. He was hospitalized in June with atypical chest pain.  Pharmacologic nuclear stress test showed fixed inferior wall defect with normal ejection fraction. The patient denies any chest pain or shortness of breath.  His memory continues to decline.  Past Medical History:  Diagnosis Date  . Arthritis of knee   . BPH (benign prostatic hypertrophy)    w/ nocturia  . Coronary artery disease 03/2014   Inferior ST elevation myocardial infarction. Cardiac catheterization showed an occluded mid RCA. He had an angioplasty and drug-eluting stent placement with a 3.0 x 16 mm Promus drug-eluting stent. Ejection fraction was 45% by echo, completed cardiac rehab 06/2014  . Diabetes type 2, controlled ([place])   . History of basal cell cancer      s/p mohs  . History of colon polyps   . History of hypertension   . HLD (hyperlipidemia)    diet controlled in past  . Hypertension   . Insomnia    treated with multiple meds in past  . MI (myocardial infarction) ([place])   . Rosacea   . Skin cancer   . UTI (urinary tract infection)     Past Surgical History:  Procedure Laterality Date  . CARDIAC CATHETERIZATION  03/2014   [facility];x1 stent  . CATARACT EXTRACTION W/PHACO Left 10/09/2016   Procedure: CATARACT EXTRACTION PHACO AND INTRAOCULAR LENS PLACEMENT (IOC);  Surgeon: Birder Robson, MD;  Location: [facility] ORS;  Service: Ophthalmology;  Laterality: Left;  [place] 1.13 AP% 18.3 CDE 13.45 Fluid pack lot # [id number]  . COLONOSCOPY  08/2010   hyperplastic polyp, rec rpt 5 yrs  . ESOPHAGOGASTRODUODENOSCOPY  05/2015   dilated stricture, normal biopsies, HH, no definite infection [name] @ [facility])  . MOHS SURGERY     basal cell chin/back  . REPLACEMENT TOTAL KNEE Left 08/2006  . VASECTOMY  1970     Current Outpatient Medications  Medication Sig Dispense Refill  . aspirin EC 81 MG tablet Take 81 mg by mouth every morning.    . clonazePAM (KLONOPIN) 0.5 MG tablet Take 0.5 mg by mouth at bedtime.    . clopidogrel (PLAVIX) 75 MG tablet Take 1 tablet (75 mg total) by mouth at bedtime. 90 tablet 1  . doxazosin (CARDURA) 1 MG tablet TAKE 1 TABLET BY MOUTH EVERY  DAY 30 tablet 11  . finasteride (PROSCAR) 5 MG tablet Take 5 mg by mouth at bedtime.    . memantine (NAMENDA) 5 MG tablet Take 1 tablet (5 mg total) by mouth 2 (two) times daily. 60 tablet 6  . metFORMIN (GLUCOPHAGE) 500 MG tablet TAKE 1 TABLET (500 MG TOTAL) BY MOUTH 2 (TWO) TIMES DAILY WITH A MEAL. 180 tablet 1  . Multiple Vitamin (MULTIVITAMIN WITH MINERALS) TABS tablet Take 1 tablet by mouth daily.    . naproxen sodium (ANAPROX) 220 MG tablet Take 220 mg by mouth daily as needed (pain).    . nitroGLYCERIN (NITROSTAT) 0.4 MG SL tablet Place 1 tablet (0.4 mg total) under the tongue every 5  (five) minutes as needed for chest pain. 30 tablet 1  . pantoprazole (PROTONIX) 40 MG tablet Take 1 tablet (40 mg total) by mouth daily. 30 tablet 3  . pravastatin (PRAVACHOL) 40 MG tablet TAKE 1 TABLET (40 MG TOTAL) BY MOUTH DAILY. 90 tablet 3  . rOPINIRole (REQUIP) 0.25 MG tablet Take 1 tablet (0.25 mg total) by mouth at bedtime. 90 tablet 2  . vitamin B-12 (CYANOCOBALAMIN) 500 MCG tablet Take 500 mcg by mouth daily.     No current facility-administered medications for this visit.     Allergies:   Ambien [zolpidem tartrate]; Aricept [donepezil hcl]; Atorvastatin; Belsomra [suvorexant]; Penicillins; Tamsulosin; and Doxycycline    Social History:  The patient  reports that he quit smoking about 55 years ago. He has never used smokeless tobacco. He reports that he drinks about 0.6 oz of alcohol per week. He reports that he does not use drugs.   Family History:  The patient's family history includes CAD in his brother; CAD (age of onset: 20) in his brother; COPD in his brother; Cancer in his mother; Cancer (age of onset: 55) in his father; Parkinson's disease in his brother; Stroke in his sister.    ROS:  Please see the history of present illness.   Otherwise, review of systems are positive for none.   All other systems are reviewed and negative.    PHYSICAL EXAM: VS:  BP 94/62 (BP Location: Left Arm, Patient Position: Sitting, Cuff Size: Normal)   Ht 5\' 8"  (1.727 m)   Wt 192 lb (87.1 kg)   BMI 29.19 kg/m  , BMI Body mass index is 29.19 kg/m. GEN: Well nourished, well developed, in no acute distress  HEENT: normal  Neck: no JVD, carotid bruits, or masses Cardiac: RRR; no rubs, or gallops,no edema . There is a 2/6 systolic ejection murmur in the aortic area Respiratory:  clear to auscultation bilaterally, normal work of breathing GI: soft, nontender, nondistended, + BS MS: no deformity or atrophy  Skin: warm and dry, no rash Neuro:  Strength and sensation are intact Psych: euthymic  mood, full affect   EKG:  EKG is ordered today. The ekg ordered today demonstrates normal sinus rhythm with old inferior infarct.   Recent Labs: 03/15/2018: ALT 14 04/26/2018: Hemoglobin 13.0; Platelets 281 04/27/2018: TSH 0.747 04/28/2018: BUN 13; Creatinine, Ser 1.08; Potassium 3.6; Sodium 138    Lipid Panel    Component Value Date/Time   CHOL 126 03/16/2018 0412   CHOL 124 05/04/2014 0806   TRIG 135 03/16/2018 0412   HDL 53 03/16/2018 0412   HDL 53 05/04/2014 0806   CHOLHDL 2.4 03/16/2018 0412   VLDL 27 03/16/2018 0412   LDLCALC 46 03/16/2018 0412   LDLCALC 48 05/04/2014 0806  Wt Readings from Last 3 Encounters:  05/16/18 192 lb (87.1 kg)  05/06/18 189 lb (85.7 kg)  05/05/18 188 lb 8 oz (85.5 kg)       ASSESSMENT AND PLAN:  1.  Coronary artery disease involving native coronary arteries without angina: Overall, he is doing well with no anginal symptoms. I recommend continuing medical therapy.  Most recent nuclear stress test showed evidence of prior inferior infarct without ischemia.  He was hospitalized for chest pain last month but the symptoms were overall atypical and responded to a PPI.  No further chest pain since then.  2.  Carotid artery disease: The patient was found to have an occluded left carotid artery with 70% stenosis in the right carotid artery.  Given his age and comorbidities, I do not think he is a good candidate for carotid endarterectomy.  I think the options of management include carotid stenting versus continued medical therapy especially with his gradual decline of memory. The patient's family are considering the options and they will follow-up with Dr. Lucky Cowboy for further discussion.  3. Hyperlipidemia: Currently on pravastatin with most recent LDL of 46.   Disposition:   FU with me in 6 months  Signed,  Kathlyn Sacramento, MD  05/16/2018 8:46 AM    [place]

## 2018-05-18 ENCOUNTER — Other Ambulatory Visit: Payer: Self-pay

## 2018-05-18 ENCOUNTER — Encounter: Payer: Self-pay | Admitting: Family Medicine

## 2018-05-18 ENCOUNTER — Emergency Department
Admission: EM | Admit: 2018-05-18 | Discharge: 2018-05-18 | Disposition: A | Discharge status: Home / Self Care | Payer: Medicare Other | Attending: Emergency Medicine | Admitting: Emergency Medicine

## 2018-05-18 DIAGNOSIS — Z87891 Personal history of nicotine dependence: Secondary | ICD-10-CM | POA: Diagnosis not present

## 2018-05-18 DIAGNOSIS — G459 Transient cerebral ischemic attack, unspecified: Secondary | ICD-10-CM

## 2018-05-18 DIAGNOSIS — I10 Essential (primary) hypertension: Secondary | ICD-10-CM | POA: Insufficient documentation

## 2018-05-18 DIAGNOSIS — Z96652 Presence of left artificial knee joint: Secondary | ICD-10-CM | POA: Insufficient documentation

## 2018-05-18 DIAGNOSIS — I2510 Atherosclerotic heart disease of native coronary artery without angina pectoris: Secondary | ICD-10-CM | POA: Insufficient documentation

## 2018-05-18 DIAGNOSIS — Z7901 Long term (current) use of anticoagulants: Secondary | ICD-10-CM | POA: Insufficient documentation

## 2018-05-18 DIAGNOSIS — R0789 Other chest pain: Secondary | ICD-10-CM | POA: Diagnosis not present

## 2018-05-18 DIAGNOSIS — E119 Type 2 diabetes mellitus without complications: Secondary | ICD-10-CM | POA: Insufficient documentation

## 2018-05-18 DIAGNOSIS — Z85828 Personal history of other malignant neoplasm of skin: Secondary | ICD-10-CM | POA: Diagnosis not present

## 2018-05-18 DIAGNOSIS — I959 Hypotension, unspecified: Secondary | ICD-10-CM | POA: Diagnosis present

## 2018-05-18 DIAGNOSIS — R079 Chest pain, unspecified: Secondary | ICD-10-CM | POA: Diagnosis not present

## 2018-05-18 LAB — BASIC METABOLIC PANEL
ANION GAP: 10 (ref 5–15)
BUN: 17 mg/dL (ref 8–23)
CALCIUM: 8.7 mg/dL — AB (ref 8.9–10.3)
CHLORIDE: 105 mmol/L (ref 98–111)
CO2: 22 mmol/L (ref 22–32)
Creatinine, Ser: 1.2 mg/dL (ref 0.61–1.24)
GFR calc Af Amer: >60 mL/min (ref >60)
GFR calc non Af Amer: 52 mL/min — ABNORMAL LOW (ref >60)
GLUCOSE: 215 mg/dL — AB (ref 70–99)
Potassium: 4.2 mmol/L (ref 3.5–5.1)
Sodium: 137 mmol/L (ref 135–145)

## 2018-05-18 LAB — CBC WITH DIFFERENTIAL/PLATELET
BASOS ABS: 0 10*3/uL (ref 0–0.1)
Basophils Relative: 0 %
Eosinophils Absolute: 0.2 10*3/uL (ref 0–0.7)
Eosinophils Relative: 2 %
HEMATOCRIT: 35.8 % — AB (ref 40.0–52.0)
HEMOGLOBIN: 12.1 g/dL — AB (ref 13.0–18.0)
LYMPHS PCT: 15 %
Lymphs Abs: 1.5 10*3/uL (ref 1.0–3.6)
MCH: 30 pg (ref 26.0–34.0)
MCHC: 33.9 g/dL (ref 32.0–36.0)
MCV: 88.5 fL (ref 80.0–100.0)
MONOS PCT: 6 %
Monocytes Absolute: 0.6 10*3/uL (ref 0.2–1.0)
NEUTROS PCT: 77 %
Neutro Abs: 7.6 10*3/uL — ABNORMAL HIGH (ref 1.4–6.5)
Platelets: 292 10*3/uL (ref 150–440)
RBC: 4.05 MIL/uL — ABNORMAL LOW (ref 4.40–5.90)
RDW: 15.6 % — AB (ref 11.5–14.5)
WBC: 9.9 10*3/uL (ref 3.8–10.6)

## 2018-05-18 LAB — TROPONIN I: Troponin I: <0.03 ng/mL (ref <0.03)

## 2018-05-18 MED ORDER — SODIUM CHLORIDE 0.9 % IV BOLUS: 500.0000 mL | Freq: Once | INTRAVENOUS | Status: DC

## 2018-05-18 MED ORDER — SODIUM CHLORIDE 0.9 % IV BOLUS
500.0000 mL | Freq: Once | INTRAVENOUS | Status: DC
  Administered 2018-05-18: 500 mL via INTRAVENOUS

## 2018-05-18 NOTE — ED Triage Notes (Signed)
Pt arrived via ems with chest pain. Pt was just admitted for esophageal varices recently. Pt was given nitro for E.V. On last admission so pt's wife gave pt 1 nitro. Aftterwards, pt started to have left side facial droop and was non verbal. On ems arrival, facial droop and communication were resolved. Pt still c/o of some chest pain, BP on ems arrival was 78/52. After bolus, BP in the 110's. CBG-195, 324 ASA given. Pt NAD at present.

## 2018-05-18 NOTE — ED Provider Notes (Signed)
[facility] Emergency Department Provider Note       Time seen: ----------------------------------------- 10:24 AM on 05/18/2018 -----------------------------------------   I have reviewed the triage vital signs and the nursing notes.  HISTORY   Chief Complaint Hypotension    HPI Stephen [name] is a 84 y.o. male with a history of BPH, coronary artery disease, diabetes, hypertension, insomnia, MI, rosacea, UTI who presents to the ED for reported chest pain although patient denies any complaints.  Patient was just admitted for esophageal varices recently.  He was given nitroglycerin recently for esophageal varices.  Afterwards patient started to have left-sided facial droop and was nonverbal.  On EMS arrival everything had resolved.  Patient was still complaining of some chest pain although it he denies any right now.  Blood pressure on EMS arrival was 78/52.  Past Medical History:  Diagnosis Date  . Arthritis of knee   . BPH (benign prostatic hypertrophy)    w/ nocturia  . Coronary artery disease 03/2014   Inferior ST elevation myocardial infarction. Cardiac catheterization showed an occluded mid RCA. He had an angioplasty and drug-eluting stent placement with a 3.0 x 16 mm Promus drug-eluting stent. Ejection fraction was 45% by echo, completed cardiac rehab 06/2014  . Diabetes type 2, controlled ([place])   . History of basal cell cancer    s/p mohs  . History of colon polyps   . History of hypertension   . HLD (hyperlipidemia)    diet controlled in past  . Hypertension   . Insomnia    treated with multiple meds in past  . MI (myocardial infarction) ([place])   . Rosacea   . Skin cancer   . UTI (urinary tract infection)     Patient Active Problem List   Diagnosis Date Noted  . DNR (do not resuscitate) 05/05/2018  . Chest pain 04/27/2018  . Carotid stenosis, symptomatic, with infarction ([place]) 03/30/2018  . Aphasia due to acute stroke ([place]) 03/30/2018   . CVA (cerebral vascular accident) ([place]) 03/15/2018  . Systolic murmur 96/29/5284  . Complex sleep apnea syndrome 11/23/2017  . PLMD (periodic limb movement disorder) 11/23/2017  . Vascular dementia 12/17/2016  . Paresthesias 12/12/2015  . Anemia, unspecified 12/12/2015  . Hiatal hernia 05/23/2015  . Pulmonary interstitial fibrosis ([place]) 02/15/2015  . Hepatic lesion 02/15/2015  . Dysphagia 01/07/2015  . Advanced care planning/counseling discussion 11/29/2014  . Constipation 03/31/2014  . Coronary artery disease 03/12/2014  . Medicare annual wellness visit, subsequent 11/27/2013  . HLD (hyperlipidemia) 11/27/2013  . Insomnia   . Diabetes type 2, controlled ([place])   . Benign prostatic hyperplasia   . Rosacea   . History of colon polyps     Past Surgical History:  Procedure Laterality Date  . CARDIAC CATHETERIZATION  03/2014   [facility];x1 stent  . CATARACT EXTRACTION W/PHACO Left 10/09/2016   Procedure: CATARACT EXTRACTION PHACO AND INTRAOCULAR LENS PLACEMENT (IOC);  Surgeon: Birder Robson, MD;  Location: [facility] ORS;  Service: Ophthalmology;  Laterality: Left;  [place] 1.13 AP% 18.3 CDE 13.45 Fluid pack lot # [id number]  . COLONOSCOPY  08/2010   hyperplastic polyp, rec rpt 5 yrs  . ESOPHAGOGASTRODUODENOSCOPY  05/2015   dilated stricture, normal biopsies, HH, no definite infection [name] @ [facility])  . MOHS SURGERY     basal cell chin/back  . REPLACEMENT TOTAL KNEE Left 08/2006  . VASECTOMY  1970    Allergies Ambien [zolpidem tartrate]; Aricept [donepezil hcl]; Atorvastatin; Belsomra [suvorexant]; Penicillins; Tamsulosin; and Doxycycline  Social  History Social History   Tobacco Use  . Smoking status: Former Smoker    Last attempt to quit: 11/12/1962    Years since quitting: 55.5  . Smokeless tobacco: Never Used  . Tobacco comment: Quit 1964  Substance Use Topics  . Alcohol use: Yes    Alcohol/week: 0.6 oz    Types: 1 Cans of beer per week  . Drug use: No   Review of  Systems Constitutional: Negative for fever. Cardiovascular: Positive for recent chest pain Respiratory: Negative for shortness of breath. Gastrointestinal: Negative for abdominal pain, vomiting and diarrhea. Musculoskeletal: Negative for back pain. Skin: Negative for rash. Neurological: Positive for reported facial drooping and difficulty speaking  All systems negative/normal/unremarkable except as stated in the HPI  ____________________________________________   PHYSICAL EXAM:  VITAL SIGNS: ED Triage Vitals  Enc Vitals Group     BP 05/18/18 1010 98/61     Pulse Rate 05/18/18 1010 80     Resp 05/18/18 1010 (!) 22     Temp 05/18/18 1010 97.7 F (36.5 C)     Temp Source 05/18/18 1010 Oral     SpO2 05/18/18 1010 98 %     Weight 05/18/18 1011 188 lb (85.3 kg)     Height 05/18/18 1011 5\' 8"  (1.727 m)     Head Circumference --      Peak Flow --      Pain Score 05/18/18 1011 4     Pain Loc --      Pain Edu? --      Excl. in [place]? --    Constitutional: Alert and oriented. Well appearing and in no distress. Eyes: Conjunctivae are normal. Normal extraocular movements. Cardiovascular: Normal rate, regular rhythm. No murmurs, rubs, or gallops. Respiratory: Normal respiratory effort without tachypnea nor retractions. Breath sounds are clear and equal bilaterally. No wheezes/rales/rhonchi. Gastrointestinal: Soft and nontender. Normal bowel sounds Musculoskeletal: Nontender with normal range of motion in extremities. No lower extremity tenderness nor edema. Neurologic:  Normal speech and language. No gross focal neurologic deficits are appreciated.  Skin:  Skin is warm, dry and intact. No rash noted. Psychiatric: Mood and affect are normal. Speech and behavior are normal.  ____________________________________________  EKG: Interpreted by me.  Sinus rhythm rate 81 bpm, old inferior infarct, long QT, leftward axis  ____________________________________________  ED COURSE:  As part of  my medical decision making, I reviewed the following data within the [place] History obtained from family if available, nursing notes, old chart and ekg, as well as notes from prior ED visits. Patient presented for hypotension and reported chest pain, we will assess with labs and imaging as indicated at this time.   Procedures ____________________________________________   LABS (pertinent positives/negatives)  Labs Reviewed  CBC WITH DIFFERENTIAL/PLATELET - Abnormal; Notable for the following components:      Result Value   RBC 4.05 (*)    Hemoglobin 12.1 (*)    HCT 35.8 (*)    RDW 15.6 (*)    Neutro Abs 7.6 (*)    All other components within normal limits  BASIC METABOLIC PANEL - Abnormal; Notable for the following components:   Glucose, Bld 215 (*)    Calcium 8.7 (*)    GFR calc non Af Amer 52 (*)    All other components within normal limits  TROPONIN I  URINALYSIS, COMPLETE (UACMP) WITH MICROSCOPIC  CBG MONITORING, ED  ____________________________________________  DIFFERENTIAL DIAGNOSIS   Dehydration, electrolyte abnormality, medication side effect, TIA, CVA  FINAL ASSESSMENT AND PLAN  TIA   Plan: The patient had presented for hypotension. Patient's labs were within normal limits for him.  He did not require any imaging during this visit.  Patient clearly had a TIA due to transient hypotension.  He was given nitroglycerin which causes blood pressure to drop and acute neurologic symptoms which have resolved once his blood pressure has improved.  He has no neuro deficits at this time.  I discussed at length with neurology and reviewed all of his records.  He is currently taking aspirin and Plavix, I will advise holding any nitroglycerin and doubling his Protonix.  He will follow-up with vascular surgery this week   Laurence Aly, MD   Note: This note was generated in part or whole with voice recognition software. Voice recognition is usually  quite accurate but there are transcription errors that can and very often do occur. I apologize for any typographical errors that were not detected and corrected.    Earleen Newport, MD 05/18/18 1210

## 2018-05-20 DIAGNOSIS — I69320 Aphasia following cerebral infarction: Secondary | ICD-10-CM | POA: Diagnosis not present

## 2018-05-21 NOTE — Telephone Encounter (Signed)
Patient wife called and states she has not heard anything in reference to the Mychart message she sent for the patient. She states she would like a call today if possible. She is concerned . Please see below message.  CB# [phone] can leave a detailed message if no answer

## 2018-05-22 NOTE — Telephone Encounter (Signed)
Pts wife calling again to check status on getting a response to her mychart message below.

## 2018-05-22 NOTE — Telephone Encounter (Signed)
Spoke with pt's significant other, [name] (on dpr), apologizing that she had not gotten a response. I informed her Dr. Darnell Level is out of the office today but that I will check with another provider to see what they may recommend then call her back.  Verbalizes understanding.   Forwarding message to Dr. Damita Dunnings.

## 2018-05-23 ENCOUNTER — Encounter (INDEPENDENT_AMBULATORY_CARE_PROVIDER_SITE_OTHER): Payer: Self-pay | Admitting: Vascular Surgery

## 2018-05-23 ENCOUNTER — Ambulatory Visit (INDEPENDENT_AMBULATORY_CARE_PROVIDER_SITE_OTHER): Payer: Medicare Other | Admitting: Vascular Surgery

## 2018-05-23 VITALS — BP 118/68 | HR 84 | Resp 17 | Ht 67.0 in | Wt 191.0 lb

## 2018-05-23 DIAGNOSIS — I63239 Cerebral infarction due to unspecified occlusion or stenosis of unspecified carotid arteries: Secondary | ICD-10-CM | POA: Diagnosis not present

## 2018-05-23 DIAGNOSIS — I25119 Atherosclerotic heart disease of native coronary artery with unspecified angina pectoris: Secondary | ICD-10-CM

## 2018-05-23 DIAGNOSIS — N181 Chronic kidney disease, stage 1: Secondary | ICD-10-CM | POA: Diagnosis not present

## 2018-05-23 DIAGNOSIS — E7800 Pure hypercholesterolemia, unspecified: Secondary | ICD-10-CM

## 2018-05-23 DIAGNOSIS — I639 Cerebral infarction, unspecified: Secondary | ICD-10-CM | POA: Diagnosis not present

## 2018-05-23 DIAGNOSIS — F0150 Vascular dementia without behavioral disturbance: Secondary | ICD-10-CM

## 2018-05-23 DIAGNOSIS — I69320 Aphasia following cerebral infarction: Secondary | ICD-10-CM | POA: Diagnosis not present

## 2018-05-23 DIAGNOSIS — E1122 Type 2 diabetes mellitus with diabetic chronic kidney disease: Secondary | ICD-10-CM

## 2018-05-23 NOTE — Patient Instructions (Signed)
Carotid Angioplasty With Stent Carotid angioplasty is a procedure to open or widen an artery in the neck (carotid artery) that is blocked or has become narrow. This is done by using a small piece of metal that looks like a coil or spring (stent). The stent helps keep the artery open by supporting the artery walls. The carotid arteries supply blood to the brain. When fats, cholesterol, and other materials (plaque) build up in an artery, the artery becomes narrow and can become blocked. This can reduce or block blood flow to certain areas of the brain, which can cause serious health problems, including stroke. Tell a health care provider about:  Any allergies you have.  All medicines you are taking, including vitamins, herbs, eye drops, creams, and over-the-counter medicines.  Any problems you or family members have had with anesthetic medicines.  Any blood disorders you have.  Any surgeries you have had.  Any medical conditions you have.  Whether you are pregnant or may be pregnant. What are the risks? Generally, this is a safe procedure. However, problems may occur, including:  Infection.  Bleeding.  Allergic reactions to medicines or dyes.  Damage to other structures or organs, or the carotid artery itself.  The carotid artery becoming blocked again.  A collection of blood under the skin (hematoma) around the stent site that gets larger (expands).  Blood clot in another part of the body.  Kidney injury.  What happens before the procedure?  Ask your health care provider about: ? Changing or stopping your regular medicines. This is especially important if you are taking diabetes medicines or blood thinners. ? Taking medicines such as aspirin and ibuprofen. These medicines can thin your blood. Do not take these medicines before your procedure if your health care provider instructs you not to.  Follow instructions from your health care provider about eating or drinking  restrictions.  Do not use any tobacco products for at least 24 hours before your procedure. This includes cigarettes, chewing tobacco, or e-cigarettes.  Ask your health care provider how your surgical site will be marked or identified.  You may be given antibiotic medicine to help prevent infection.  You may have blood tests done.  Plan to have someone take you home after the procedure.  If you will be going home right after the procedure, plan to have someone with you for 24 hours. What happens during the procedure?  To reduce your risk of infection: ? Your health care team will wash or sanitize their hands. ? Your skin will be washed with soap.  An IV tube will be inserted into one of your veins.  You will be given one or more of the following: ? A medicine to help you relax (sedative). ? A medicine to make you fall asleep (general anesthetic).  An cut (incision) will be made. Most commonly, an incision will be made in your groin. In some cases, an incision may be made in your wrist or forearm instead of your groin.  A small, flexible tube (catheter) will be inserted through your incision, into an artery. The catheter will be threaded upward into your carotid artery. An X-ray machine (fluoroscope) will help your health care provider guide the catheter to the correct place in your artery.  Dye will be injected into the catheter and will travel to the narrow or blocked part of your carotid artery.  X-ray images will be taken of how the dye flows through your artery. While images are being taken,  you may be given instructions about breathing, swallowing, moving, or talking.  A filter (distal protection device) will be inserted into your artery. This will be used to catch plaque that comes loose in your artery during the procedure. This prevents plaque from moving into your brain.  A small balloon will be inserted into your artery. The balloon will be inflated for a few seconds to  widen your artery and then removed.  The stent will be placed in your artery.  A second small balloon will be inserted into your artery and inflated. This expands the stent inside of your artery, so that the stent holds up the artery walls. The balloon will then be removed.  The catheter and the distal protection device will be removed from your artery.  Your incision may be closed with stitches (sutures), skin glue, or adhesive tape.  A bandage (dressing) will be placed over your incision. The procedure may vary among health care providers and hospitals. What happens after the procedure?  Your blood pressure, heart rate, breathing rate, and blood oxygen level will be monitored often until the medicines you were given have worn off.  You may continue to receive fluids and medicines through an IV tube.  You may have some pain. Pain medicines will be available to help you.  You may have X-rays to make sure that the stent is in the correct place.  You may have to wear compression stockings. These stockings help to prevent blood clots and reduce swelling in your legs.  Do not drive for 24 hours if you received a sedative. This information is not intended to replace advice given to you by your health care provider. Make sure you discuss any questions you have with your health care provider. Document Released: 03/12/2005 Document Revised: 04/05/2016 Document Reviewed: 07/24/2015 Elsevier Interactive Patient Education  [employer].

## 2018-05-23 NOTE — Assessment & Plan Note (Signed)
He has undergone a CT angiogram which I have independently reviewed.  This demonstrates a known left carotid artery occlusion.  He has a moderately calcific and mild to moderately tortuous right carotid system with what was read as a 70% stenosis, although I believe it is a little worse than that based off of calcification obscuring the view somewhat.  Either way, it is at least a 70% stenosis with recurrent symptoms and a contralateral occlusion making this a high risk problem.  I had a long discussion today with the patient and his accompanying friend.  The friend is going to discuss the situation with his family.  He is a very high risk patient for procedure, but is extremely high risk with medical management alone and is failing that.  He is having recurrent TIAs and has had previous strokes with the anatomy described above.  Although his anatomy is not ideal for carotid stenting, it certainly does not appear to be prohibitive either.  I have quoted them a higher risk of somewhere in the 4 to 5% range for major complications with carotid stenting, but this would still be far lower than his medical risk of left Lyme.  His friend is going to discuss things with his family to determine whether or not they would like to proceed and they will call our office if they would like to schedule a carotid stent on the right side

## 2018-05-23 NOTE — Progress Notes (Signed)
MRN : [id number]  Stephen [name] is a 84 y.o. (1927/03/12) male who presents with chief complaint of  Chief Complaint  Patient presents with  . Follow-up    CT results  .  History of Present Illness: Patient returns today in follow up of his carotid disease.  His friend accompanying him says that he has had 2 more TIAs since his last visit in the office last month.  He has been having some chest pain and when he takes nitroglycerin and his blood pressure drops, he has had facial droop and a fascia.  These resolved within few hours.  He has undergone a CT angiogram which I have independently reviewed.  This demonstrates a known left carotid artery occlusion.  He has a moderately calcific and mild to moderately tortuous right carotid system with what was read as a 70% stenosis, although I believe it is a little worse than that based off of calcification obscuring the view somewhat.  Either way, it is at least a 70% stenosis with recurrent symptoms and a contralateral occlusion making this a high risk problem.  Current Outpatient Medications  Medication Sig Dispense Refill  . aspirin EC 81 MG tablet Take 81 mg by mouth every morning.    . clonazePAM (KLONOPIN) 0.5 MG tablet Take 0.5 mg by mouth at bedtime.    . clopidogrel (PLAVIX) 75 MG tablet Take 1 tablet (75 mg total) by mouth at bedtime. 90 tablet 1  . doxazosin (CARDURA) 1 MG tablet TAKE 1 TABLET BY MOUTH EVERY DAY 30 tablet 11  . finasteride (PROSCAR) 5 MG tablet Take 5 mg by mouth at bedtime.    . memantine (NAMENDA) 5 MG tablet Take 1 tablet (5 mg total) by mouth 2 (two) times daily. 60 tablet 6  . metFORMIN (GLUCOPHAGE) 500 MG tablet TAKE 1 TABLET (500 MG TOTAL) BY MOUTH 2 (TWO) TIMES DAILY WITH A MEAL. 180 tablet 1  . Multiple Vitamin (MULTIVITAMIN WITH MINERALS) TABS tablet Take 1 tablet by mouth daily.    . naproxen sodium (ANAPROX) 220 MG tablet Take 220 mg by mouth daily as needed (pain).    . nitroGLYCERIN (NITROSTAT) 0.4 MG  SL tablet Place 1 tablet (0.4 mg total) under the tongue every 5 (five) minutes as needed for chest pain. 30 tablet 1  . pantoprazole (PROTONIX) 40 MG tablet Take 1 tablet (40 mg total) by mouth daily. 30 tablet 3  . pravastatin (PRAVACHOL) 40 MG tablet TAKE 1 TABLET (40 MG TOTAL) BY MOUTH DAILY. 90 tablet 3  . rOPINIRole (REQUIP) 0.25 MG tablet Take 1 tablet (0.25 mg total) by mouth at bedtime. 90 tablet 2  . vitamin B-12 (CYANOCOBALAMIN) 500 MCG tablet Take 500 mcg by mouth daily.     No current facility-administered medications for this visit.     Past Medical History:  Diagnosis Date  . Arthritis of knee   . BPH (benign prostatic hypertrophy)    w/ nocturia  . Coronary artery disease 03/2014   Inferior ST elevation myocardial infarction. Cardiac catheterization showed an occluded mid RCA. He had an angioplasty and drug-eluting stent placement with a 3.0 x 16 mm Promus drug-eluting stent. Ejection fraction was 45% by echo, completed cardiac rehab 06/2014  . Diabetes type 2, controlled ([place])   . History of basal cell cancer    s/p mohs  . History of colon polyps   . History of hypertension   . HLD (hyperlipidemia)    diet controlled in past  .  Hypertension   . Insomnia    treated with multiple meds in past  . MI (myocardial infarction) ([place])   . Rosacea   . Skin cancer   . UTI (urinary tract infection)     Past Surgical History:  Procedure Laterality Date  . CARDIAC CATHETERIZATION  03/2014   [facility];x1 stent  . CATARACT EXTRACTION W/PHACO Left 10/09/2016   Procedure: CATARACT EXTRACTION PHACO AND INTRAOCULAR LENS PLACEMENT (IOC);  Surgeon: Birder Robson, MD;  Location: [facility] ORS;  Service: Ophthalmology;  Laterality: Left;  [place] 1.13 AP% 18.3 CDE 13.45 Fluid pack lot # [id number]  . COLONOSCOPY  08/2010   hyperplastic polyp, rec rpt 5 yrs  . ESOPHAGOGASTRODUODENOSCOPY  05/2015   dilated stricture, normal biopsies, HH, no definite infection [name] @ [facility])  . MOHS SURGERY      basal cell chin/back  . REPLACEMENT TOTAL KNEE Left 08/2006  . VASECTOMY  1970    Social History Social History   Tobacco Use  . Smoking status: Former Smoker    Last attempt to quit: 11/12/1962    Years since quitting: 55.5  . Smokeless tobacco: Never Used  . Tobacco comment: Quit 1964  Substance Use Topics  . Alcohol use: Yes    Alcohol/week: 0.6 oz    Types: 1 Cans of beer per week  . Drug use: No     Family History Family History  Problem Relation Age of Onset  . Cancer Father 66       prostate and colon  . Cancer Mother        ovarian or uterine  . CAD Brother 64       MI, smoker  . Parkinson's disease Brother   . COPD Brother   . CAD Brother        MI  . Stroke Sister      Allergies  Allergen Reactions  . Ambien [Zolpidem Tartrate]     unknown  . Aricept [Donepezil Hcl] Diarrhea  . Atorvastatin     muscle aches  . Belsomra [Suvorexant] Other (See Comments)    anxiety  . Penicillins Hives    Has patient had a PCN reaction causing immediate rash, facial/tongue/throat swelling, SOB or lightheadedness with hypotension: Yes Has patient had a PCN reaction causing severe rash involving mucus membranes or skin necrosis: Yes Has patient had a PCN reaction that required hospitalization No Has patient had a PCN reaction occurring within the last 10 years: No If all of the above answers are "NO", then may proceed with Cephalosporin use.   . Tamsulosin Other (See Comments)    Leg weakness  . Doxycycline Rash     REVIEW OF SYSTEMS (Negative unless checked)  Constitutional: [] Weight loss  [] Fever  [] Chills Cardiac: [] Chest pain   [] Chest pressure   [] Palpitations   [] Shortness of breath when laying flat   [] Shortness of breath at rest   [] Shortness of breath with exertion. Vascular:  [] Pain in legs with walking   [] Pain in legs at rest   [] Pain in legs when laying flat   [] Claudication   [] Pain in feet when walking  [] Pain in feet at rest  [] Pain in feet when  laying flat   [] History of DVT   [] Phlebitis   [] Swelling in legs   [] Varicose veins   [] Non-healing ulcers Pulmonary:   [] Uses home oxygen   [] Productive cough   [] Hemoptysis   [] Wheeze  [] COPD   [] Asthma Neurologic:  [] Dizziness  [] Blackouts   [] Seizures   [x] History  of stroke   [x] History of TIA  [x] Aphasia   [] Temporary blindness   [] Dysphagia   [] Weakness or numbness in arms   [] Weakness or numbness in legs Musculoskeletal:  [x] Arthritis   [] Joint swelling   [x] Joint pain   [] Low back pain Hematologic:  [] Easy bruising  [] Easy bleeding   [] Hypercoagulable state   [] Anemic  [] Hepatitis Gastrointestinal:  [] Blood in stool   [] Vomiting blood  [x] Gastroesophageal reflux/heartburn   [] Abdominal pain Genitourinary:  [] Chronic kidney disease   [] Difficult urination  [x] Frequent urination  [] Burning with urination   [] Hematuria Skin:  [] Rashes   [] Ulcers   [] Wounds Psychological:  [] History of anxiety   []  History of major depression.    Physical Examination  BP 118/68 (BP Location: Left Arm, Patient Position: Sitting)   Pulse 84   Resp 17   Ht 5' 7"  (1.702 m)   Wt 191 lb (86.6 kg)   BMI 29.91 kg/m  Gen:  WD/WN, NAD.  Appears younger than stated age Head: [place]/AT, No temporalis wasting. Ear/Nose/Throat: Hearing grossly intact, nares w/o erythema or drainage Eyes: Conjunctiva clear. Sclera non-icteric Neck: Supple.  Trachea midline Pulmonary:  Good air movement, no use of accessory muscles.  Cardiac: Irregular Vascular:  Vessel Right Left  Radial Palpable Palpable                                   Gastrointestinal: soft, non-tender/non-distended Musculoskeletal: M/S 5/5 throughout.  No deformity or atrophy.  Trace lower extremity edema. Neurologic: Sensation grossly intact in extremities.  Symmetrical.  Speech is fluent.  Psychiatric: Judgment and insight do not seem great.  He is not a good historian Dermatologic: No rashes or ulcers noted.  No cellulitis or open  wounds.       Labs Recent Results (from the past 2160 hour(s))  Comprehensive metabolic panel     Status: Abnormal   Collection Time: 03/15/18  8:53 AM  Result Value Ref Range   Sodium 135 135 - 145 mmol/L   Potassium 4.2 3.5 - 5.1 mmol/L   Chloride 104 101 - 111 mmol/L   CO2 23 22 - 32 mmol/L   Glucose, Bld 196 (H) 65 - 99 mg/dL   BUN 14 6 - 20 mg/dL   Creatinine, Ser 1.12 0.61 - 1.24 mg/dL   Calcium 8.7 (L) 8.9 - 10.3 mg/dL   Total Protein 7.0 6.5 - 8.1 g/dL   Albumin 3.7 3.5 - 5.0 g/dL   AST 30 15 - 41 U/L   ALT 14 (L) 17 - 63 U/L   Alkaline Phosphatase 55 38 - 126 U/L   Total Bilirubin 0.6 0.3 - 1.2 mg/dL   GFR calc non Af Amer 57 (L) >60 mL/min   GFR calc Af Amer >60 >60 mL/min    Comment: (NOTE) The eGFR has been calculated using the CKD EPI equation. This calculation has not been validated in all clinical situations. eGFR's persistently <60 mL/min signify possible Chronic Kidney Disease.    Anion gap 8 5 - 15    Comment: Performed at [facility], [address] [place], [place] [address]  CBC     Status: Abnormal   Collection Time: 03/15/18  8:53 AM  Result Value Ref Range   WBC 7.2 3.8 - 10.6 K/uL   RBC 4.21 (L) 4.40 - 5.90 MIL/uL   Hemoglobin 12.4 (L) 13.0 - 18.0 g/dL   HCT 37.9 (L) 40.0 - 52.0 %  MCV 90.0 80.0 - 100.0 fL   MCH 29.5 26.0 - 34.0 pg   MCHC 32.7 32.0 - 36.0 g/dL   RDW 14.7 (H) 11.5 - 14.5 %   Platelets 297 150 - 440 K/uL    Comment: Performed at [facility], [address] [place], [place] [address]  Differential     Status: None   Collection Time: 03/15/18  8:53 AM  Result Value Ref Range   Neutrophils Relative % 65 %   Neutro Abs 4.7 1.4 - 6.5 K/uL   Lymphocytes Relative 23 %   Lymphs Abs 1.6 1.0 - 3.6 K/uL   Monocytes Relative 8 %   Monocytes Absolute 0.6 0.2 - 1.0 K/uL   Eosinophils Relative 3 %   Eosinophils Absolute 0.2 0 - 0.7 K/uL   Basophils Relative 1 %   Basophils Absolute 0.1 0 - 0.1 K/uL     Comment: Performed at [facility], [address] [place], [place] [address]  Troponin I     Status: None   Collection Time: 03/15/18  8:53 AM  Result Value Ref Range   Troponin I <0.03 <0.03 ng/mL    Comment: Performed at [facility], [address], [place], [place] [address]  Urine Drug Screen, Qualitative     Status: None   Collection Time: 03/15/18  9:31 AM  Result Value Ref Range   Tricyclic, Ur Screen NONE DETECTED NONE DETECTED   Amphetamines, Ur Screen NONE DETECTED NONE DETECTED   MDMA (Ecstasy)Ur Screen NONE DETECTED NONE DETECTED   Cocaine Metabolite,Ur [place] NONE DETECTED NONE DETECTED   Opiate, Ur Screen NONE DETECTED NONE DETECTED   Phencyclidine (PCP) Ur S NONE DETECTED NONE DETECTED   Cannabinoid 50 Ng, Ur [place] NONE DETECTED NONE DETECTED   Barbiturates, Ur Screen NONE DETECTED NONE DETECTED   Benzodiazepine, Ur Scrn NONE DETECTED NONE DETECTED   Methadone Scn, Ur NONE DETECTED NONE DETECTED    Comment: (NOTE) Tricyclics + metabolites, urine    Cutoff 1000 ng/mL Amphetamines + metabolites, urine  Cutoff 1000 ng/mL MDMA (Ecstasy), urine              Cutoff 500 ng/mL Cocaine Metabolite, urine          Cutoff 300 ng/mL Opiate + metabolites, urine        Cutoff 300 ng/mL Phencyclidine (PCP), urine         Cutoff 25 ng/mL Cannabinoid, urine                 Cutoff 50 ng/mL Barbiturates + metabolites, urine  Cutoff 200 ng/mL Benzodiazepine, urine              Cutoff 200 ng/mL Methadone, urine                   Cutoff 300 ng/mL The urine drug screen provides only a preliminary, unconfirmed analytical test result and should not be used for non-medical purposes. Clinical consideration and professional judgment should be applied to any positive drug screen result due to possible interfering substances. A more specific alternate chemical method must be used in order to obtain a confirmed analytical result. Gas chromatography / mass spectrometry (GC/MS) is  the preferred confirmat ory method. Performed at [facility], [address] [place], [place] [address]   Urinalysis, Routine w reflex microscopic     Status: Abnormal   Collection Time: 03/15/18  9:31 AM  Result Value Ref Range   Color, Urine YELLOW (A) YELLOW  APPearance CLEAR (A) CLEAR   Specific Gravity, Urine 1.015 1.005 - 1.030   pH 5.0 5.0 - 8.0   Glucose, UA NEGATIVE NEGATIVE mg/dL   Hgb urine dipstick NEGATIVE NEGATIVE   Bilirubin Urine NEGATIVE NEGATIVE   Ketones, ur NEGATIVE NEGATIVE mg/dL   Protein, ur NEGATIVE NEGATIVE mg/dL   Nitrite NEGATIVE NEGATIVE   Leukocytes, UA NEGATIVE NEGATIVE    Comment: Performed at [facility], [address], [place], [place] [address]  Ethanol     Status: None   Collection Time: 03/15/18  9:31 AM  Result Value Ref Range   Alcohol, Ethyl (B) <10 <10 mg/dL    Comment:        LOWEST DETECTABLE LIMIT FOR SERUM ALCOHOL IS 10 mg/dL FOR MEDICAL PURPOSES ONLY Performed at [facility], [address], [place], [place] [address]   Protime-INR     Status: None   Collection Time: 03/15/18  9:31 AM  Result Value Ref Range   Prothrombin Time 13.6 11.4 - 15.2 seconds   INR 1.05     Comment: Performed at [facility], [address] [place], [place] [address]  APTT     Status: None   Collection Time: 03/15/18  9:31 AM  Result Value Ref Range   aPTT 33 24 - 36 seconds    Comment: Performed at [facility], [place], [place] [address]  Glucose, capillary     Status: Abnormal   Collection Time: 03/15/18  8:47 PM  Result Value Ref Range   Glucose-Capillary 107 (H) 65 - 99 mg/dL  Hemoglobin A1c     Status: Abnormal   Collection Time: 03/16/18  4:12 AM  Result Value Ref Range   Hgb A1c MFr Bld 6.6 (H) 4.8 - 5.6 %    Comment: (NOTE) Pre diabetes:          5.7%-6.4% Diabetes:              >6.4% Glycemic control for   <7.0% adults with diabetes    Mean Plasma Glucose  142.72 mg/dL    Comment: Performed at [facility] [address] [place], [place] [address]  Lipid panel     Status: None   Collection Time: 03/16/18  4:12 AM  Result Value Ref Range   Cholesterol 126 0 - 200 mg/dL   Triglycerides 135 <150 mg/dL   HDL 53 >40 mg/dL   Total CHOL/HDL Ratio 2.4 RATIO   VLDL 27 0 - 40 mg/dL   LDL Cholesterol 46 0 - 99 mg/dL    Comment:        Total Cholesterol/HDL:CHD Risk Coronary Heart Disease Risk Table                     Men   Women  1/2 Average Risk   3.4   3.3  Average Risk       5.0   4.4  2 X Average Risk   9.6   7.1  3 X Average Risk  23.4   11.0        Use the calculated Patient Ratio above and the CHD Risk Table to determine the patient's CHD Risk.        ATP III CLASSIFICATION (LDL):  <100     mg/dL   Optimal  100-129  mg/dL   Near or Above  Optimal  130-159  mg/dL   Borderline  160-189  mg/dL   High  >190     mg/dL   Very High Performed at [facility], [address] [place], [place] [address]   Glucose, capillary     Status: Abnormal   Collection Time: 03/16/18  8:02 AM  Result Value Ref Range   Glucose-Capillary 104 (H) 65 - 99 mg/dL  Glucose, capillary     Status: Abnormal   Collection Time: 03/16/18 11:57 AM  Result Value Ref Range   Glucose-Capillary 122 (H) 65 - 99 mg/dL  ECHOCARDIOGRAM COMPLETE     Status: None   Collection Time: 03/16/18  2:42 PM  Result Value Ref Range   Weight 3,078.4 oz   Height 67 in   BP 100/65 mmHg  Glucose, capillary     Status: Abnormal   Collection Time: 03/16/18  5:09 PM  Result Value Ref Range   Glucose-Capillary 122 (H) 65 - 99 mg/dL  Glucose, capillary     Status: Abnormal   Collection Time: 03/16/18  8:45 PM  Result Value Ref Range   Glucose-Capillary 162 (H) 65 - 99 mg/dL  Glucose, capillary     Status: Abnormal   Collection Time: 03/17/18  7:49 AM  Result Value Ref Range   Glucose-Capillary 113 (H) 65 - 99 mg/dL  Glucose, capillary      Status: Abnormal   Collection Time: 03/17/18 12:08 PM  Result Value Ref Range   Glucose-Capillary 130 (H) 65 - 99 mg/dL  TB Skin Test     Status: None   Collection Time: 04/24/18  3:25 PM  Result Value Ref Range   TB Skin Test Negative    Induration 0 mm  Basic metabolic panel     Status: Abnormal   Collection Time: 04/26/18 10:31 PM  Result Value Ref Range   Sodium 136 135 - 145 mmol/L   Potassium 4.1 3.5 - 5.1 mmol/L   Chloride 102 101 - 111 mmol/L   CO2 25 22 - 32 mmol/L   Glucose, Bld 156 (H) 65 - 99 mg/dL   BUN 19 6 - 20 mg/dL   Creatinine, Ser 1.13 0.61 - 1.24 mg/dL   Calcium 9.2 8.9 - 10.3 mg/dL   GFR calc non Af Amer 56 (L) >60 mL/min   GFR calc Af Amer >60 >60 mL/min    Comment: (NOTE) The eGFR has been calculated using the CKD EPI equation. This calculation has not been validated in all clinical situations. eGFR's persistently <60 mL/min signify possible Chronic Kidney Disease.    Anion gap 9 5 - 15    Comment: Performed at [facility], [address] [place], [place] [address]  CBC     Status: Abnormal   Collection Time: 04/26/18 10:31 PM  Result Value Ref Range   WBC 12.4 (H) 3.8 - 10.6 K/uL   RBC 4.39 (L) 4.40 - 5.90 MIL/uL   Hemoglobin 13.0 13.0 - 18.0 g/dL   HCT 38.9 (L) 40.0 - 52.0 %   MCV 88.5 80.0 - 100.0 fL   MCH 29.6 26.0 - 34.0 pg   MCHC 33.4 32.0 - 36.0 g/dL   RDW 14.9 (H) 11.5 - 14.5 %   Platelets 281 150 - 440 K/uL    Comment: Performed at [facility], [address] [place], [place] [address]  Troponin I     Status: None   Collection Time: 04/26/18 10:31 PM  Result Value Ref Range   Troponin  I <0.03 <0.03 ng/mL    Comment: Performed at [facility], [address] [place], [place] [address]  Protime-INR     Status: None   Collection Time: 04/26/18 10:31 PM  Result Value Ref Range   Prothrombin Time 13.7 11.4 - 15.2 seconds   INR 1.06     Comment: Performed at [facility], [address]  [place], [place] [address]  MRSA PCR Screening     Status: None   Collection Time: 04/27/18  2:00 AM  Result Value Ref Range   MRSA by PCR NEGATIVE NEGATIVE    Comment:        The GeneXpert MRSA Assay (FDA approved for NASAL specimens only), is one component of a comprehensive MRSA colonization surveillance program. It is not intended to diagnose MRSA infection nor to guide or monitor treatment for MRSA infections. Performed at [facility], [address] [place], [place] [address]   TSH     Status: None   Collection Time: 04/27/18  5:24 AM  Result Value Ref Range   TSH 0.747 0.350 - 4.500 uIU/mL    Comment: Performed by a 3rd Generation assay with a functional sensitivity of <=0.01 uIU/mL. Performed at [facility], [address] [place], [place] [address]   Troponin I     Status: None   Collection Time: 04/27/18  5:24 AM  Result Value Ref Range   Troponin I <0.03 <0.03 ng/mL    Comment: Performed at [facility], [address] [place], [place] [address]  Troponin I     Status: None   Collection Time: 04/27/18 11:31 AM  Result Value Ref Range   Troponin I <0.03 <0.03 ng/mL    Comment: Performed at [facility], [address] [place], [place] [address]  Troponin I     Status: None   Collection Time: 04/27/18  5:30 PM  Result Value Ref Range   Troponin I <0.03 <0.03 ng/mL    Comment: Performed at [facility], [address] [place], [place] [address]  Basic metabolic panel     Status: Abnormal   Collection Time: 04/28/18  4:28 AM  Result Value Ref Range   Sodium 138 135 - 145 mmol/L   Potassium 3.6 3.5 - 5.1 mmol/L   Chloride 106 101 - 111 mmol/L   CO2 23 22 - 32 mmol/L   Glucose, Bld 128 (H) 65 - 99 mg/dL   BUN 13 6 - 20 mg/dL   Creatinine, Ser 1.08 0.61 - 1.24 mg/dL   Calcium 8.5 (L) 8.9 - 10.3 mg/dL   GFR calc non Af Amer 59 (L) >60 mL/min   GFR calc Af Amer >60 >60 mL/min    Comment: (NOTE) The eGFR has been  calculated using the CKD EPI equation. This calculation has not been validated in all clinical situations. eGFR's persistently <60 mL/min signify possible Chronic Kidney Disease.    Anion gap 9 5 - 15    Comment: Performed at [facility], [address] [place], [place] [address]  NM Myocar Multi W/Spect Tamela Oddi Motion / EF     Status: None   Collection Time: 04/28/18 10:59 AM  Result Value Ref Range   Rest HR 75 bpm   Rest BP 101/62 mmHg   Exercise duration (sec) 0 sec   Percent HR 68 %   Exercise duration (min) 0 min   Estimated workload 1.0 METS   Peak HR 91 bpm   Peak  BP 91/54 mmHg   MPHR 132 bpm   SSS 9    SRS 12    SDS 0    TID 1.10    LV sys vol 44 mL   LV dias vol 80 62 - 150 mL  CBC with Differential     Status: Abnormal   Collection Time: 05/18/18 10:21 AM  Result Value Ref Range   WBC 9.9 3.8 - 10.6 K/uL   RBC 4.05 (L) 4.40 - 5.90 MIL/uL   Hemoglobin 12.1 (L) 13.0 - 18.0 g/dL   HCT 35.8 (L) 40.0 - 52.0 %   MCV 88.5 80.0 - 100.0 fL   MCH 30.0 26.0 - 34.0 pg   MCHC 33.9 32.0 - 36.0 g/dL   RDW 15.6 (H) 11.5 - 14.5 %   Platelets 292 150 - 440 K/uL   Neutrophils Relative % 77 %   Neutro Abs 7.6 (H) 1.4 - 6.5 K/uL   Lymphocytes Relative 15 %   Lymphs Abs 1.5 1.0 - 3.6 K/uL   Monocytes Relative 6 %   Monocytes Absolute 0.6 0.2 - 1.0 K/uL   Eosinophils Relative 2 %   Eosinophils Absolute 0.2 0 - 0.7 K/uL   Basophils Relative 0 %   Basophils Absolute 0.0 0 - 0.1 K/uL    Comment: Performed at [facility], [address], [place], [place] [address]  Basic metabolic panel     Status: Abnormal   Collection Time: 05/18/18 10:21 AM  Result Value Ref Range   Sodium 137 135 - 145 mmol/L   Potassium 4.2 3.5 - 5.1 mmol/L   Chloride 105 98 - 111 mmol/L    Comment: Please note change in reference range.   CO2 22 22 - 32 mmol/L   Glucose, Bld 215 (H) 70 - 99 mg/dL    Comment: Please note change in reference range.   BUN 17 8 - 23 mg/dL    Comment:  Please note change in reference range.   Creatinine, Ser 1.20 0.61 - 1.24 mg/dL   Calcium 8.7 (L) 8.9 - 10.3 mg/dL   GFR calc non Af Amer 52 (L) >60 mL/min   GFR calc Af Amer >60 >60 mL/min    Comment: (NOTE) The eGFR has been calculated using the CKD EPI equation. This calculation has not been validated in all clinical situations. eGFR's persistently <60 mL/min signify possible Chronic Kidney Disease.    Anion gap 10 5 - 15    Comment: Performed at [facility], [address] [place], [place] [address]  Troponin I     Status: None   Collection Time: 05/18/18 10:21 AM  Result Value Ref Range   Troponin I <0.03 <0.03 ng/mL    Comment: Performed at [facility], [address] [place], [place] [address]    Radiology Dg Chest 1 View  Result Date: 04/26/2018 CLINICAL DATA:  Acute onset of chest pain. EXAM: CHEST  1 VIEW COMPARISON:  None. FINDINGS: Mild cardiomegaly and tortuous thoracic aorta. Retrocardiac hiatal hernia. Mild interstitial coarsening. No frank pulmonary edema. Suspect small pleural effusions incompletely assessed on portable AP view. No focal airspace disease. No pneumothorax. Chronic change of both shoulders. IMPRESSION: Mild cardiomegaly and small suspected pleural effusions. Hiatal hernia. Electronically Signed   By: Jeb Levering M.D.   On: 04/26/2018 23:48   Ct Angio Neck W/cm &/or Wo/cm  Result Date: 05/09/2018 CLINICAL DATA:  Arterial stricture/occlusion, head or neck. Occlusion of the left internal carotid artery. Recent left MCA  territory infarct. Weakness and vision change. EXAM: CT ANGIOGRAPHY NECK TECHNIQUE: Multidetector CT imaging of the neck was performed using the standard protocol during bolus administration of intravenous contrast. Multiplanar CT image reconstructions and MIPs were obtained to evaluate the vascular anatomy. Carotid stenosis measurements (when applicable) are obtained utilizing NASCET criteria, using the distal  internal carotid diameter as the denominator. CONTRAST:  43m OMNIPAQUE IOHEXOL 350 MG/ML SOLN COMPARISON:  Carotid Doppler ultrasound 03/15/2018. MRI brain 03/15/2018 FINDINGS: Aortic arch: A 3 vessel arch configuration is present. Atherosclerotic calcifications are present in the arch and at the origins the great vessels. There is a 50% stenosis proximally in the left common carotid artery. No significant proximal great vessel stenosis or occlusion is present. Right carotid system: The right common carotid artery demonstrates mild tortuosity. Dense atherosclerotic calcifications are present at the right carotid bifurcation. The lumen is narrowed to 1.5 mm. This compares with a more distal measurement of 5.4 mm. No distal tandem stenosis is present. Left carotid system: The left common carotid artery demonstrates approximately 50% stenosis. No other focal atherosclerotic disease or stenosis is present proximal to the bifurcation. Dense calcifications are present at the left carotid bifurcation. The left internal carotid artery is occluded at the bifurcation. The left internal carotid artery is reconstituted from uKoreacurve branches C2 and C1 level. There is contrast filling the lumen to the skull base. Vertebral arteries: The vertebral arteries originate from the subclavian arteries bilaterally. The right vertebral artery is dominant. There is no significant focal stenosis either vertebral artery origin. Focal stenosis is present throughout the neck. Skeleton: Multilevel facet degenerative changes are present throughout the cervical spine. Grade 1 anterolisthesis present at C5-6 and C6-7. Osseous foraminal narrowing greatest on left C5-6. No focal lytic or blastic lesions are present. Other neck: Soft tissues the neck are otherwise unremarkable. Thyroid is somewhat atrophic. No significant adenopathy is present. Salivary glands are within limits. Upper chest: Diffuse ground-glass attenuation present right upper lobe.  There is dependent atelectasis in left upper lobe. No focal mass lesion or nodules are present. Thoracic inlet is otherwise normal. Subcentimeter paratracheal lymph nodes are present. No significant mediastinal adenopathy is present. IMPRESSION: 1. Occluded left internal carotid artery at the bifurcation. 2. The distal left internal carotid artery is reconstituted and normal caliber the cavernous and precavernous portions. Small muscular branches are present in the upper cervical ICA. 3. 70% stenosis at the right carotid bifurcation. 4. The right vertebral artery is dominant without significant vertebral artery stenosis in the neck. 5. Multilevel degenerative changes of the cervical spine as described. 6. Asymmetric right upper lobe airspace disease with diffuse ground-glass attenuation. This may represent atelectasis, infection, or edema. No significant consolidation is present. Electronically Signed   By: CSan MorelleM.D.   On: 05/09/2018 12:45   Nm Myocar Multi W/spect W/wall Motion / Ef  Result Date: 04/28/2018 Pharmacological myocardial perfusion imaging study with no significant  Ischemia Non-attenuation corrected images with fixed inferior wall defect Attenuation corrected images with significant GI uptake artifact limiting appropriate review of the inferior wall, otherwise no ischemia Inferior wall hypokinesis,  EF estimated at 87% No EKG changes concerning for ischemia at peak stress or in recovery. Low risk scan Clinical correlation recommended, given his GI uptake artifact,  if he has anginal symptoms, consider alternate study such as cardiac CTA Signed, TEsmond Plants MD, Ph.D [facility]    Assessment/Plan Coronary artery disease His cardiologist has said he cannot have general anesthesia so open carotid endarterectomy  is not an option  HLD (hyperlipidemia) lipid control important in reducing the progression of atherosclerotic disease. Continue statin therapy   Diabetes type  2, controlled blood glucose control important in reducing the progression of atherosclerotic disease. Also, involved in wound healing. On appropriate medications.   CVA (cerebral vascular accident) ([place]) Likely from carotid disease.  See work-up as below.  Vascular dementia Although this is not likely to improve with revascularization, revascularization could slow progression  Carotid stenosis, symptomatic, with infarction [facility] [address]) He has undergone a CT angiogram which I have independently reviewed.  This demonstrates a known left carotid artery occlusion.  He has a moderately calcific and mild to moderately tortuous right carotid system with what was read as a 70% stenosis, although I believe it is a little worse than that based off of calcification obscuring the view somewhat.  Either way, it is at least a 70% stenosis with recurrent symptoms and a contralateral occlusion making this a high risk problem.  I had a long discussion today with the patient and his accompanying friend.  The friend is going to discuss the situation with his family.  He is a very high risk patient for procedure, but is extremely high risk with medical management alone and is failing that.  He is having recurrent TIAs and has had previous strokes with the anatomy described above.  Although his anatomy is not ideal for carotid stenting, it certainly does not appear to be prohibitive either.  I have quoted them a higher risk of somewhere in the 4 to 5% range for major complications with carotid stenting, but this would still be far lower than his medical risk of left Lyme.  His friend is going to discuss things with his family to determine whether or not they would like to proceed and they will call our office if they would like to schedule a carotid stent on the right side    Leotis Pain, MD  05/23/2018 10:28 AM    This note was created with Dragon medical transcription system.  Any errors from dictation are purely  unintentional

## 2018-05-23 NOTE — Assessment & Plan Note (Signed)
Although this is not likely to improve with revascularization, revascularization could slow progression

## 2018-05-27 ENCOUNTER — Encounter (INDEPENDENT_AMBULATORY_CARE_PROVIDER_SITE_OTHER): Payer: Self-pay

## 2018-05-27 DIAGNOSIS — I69320 Aphasia following cerebral infarction: Secondary | ICD-10-CM | POA: Diagnosis not present

## 2018-05-30 DIAGNOSIS — I69320 Aphasia following cerebral infarction: Secondary | ICD-10-CM | POA: Diagnosis not present

## 2018-06-02 ENCOUNTER — Other Ambulatory Visit (INDEPENDENT_AMBULATORY_CARE_PROVIDER_SITE_OTHER): Payer: Self-pay | Admitting: Vascular Surgery

## 2018-06-03 DIAGNOSIS — I69320 Aphasia following cerebral infarction: Secondary | ICD-10-CM | POA: Diagnosis not present

## 2018-06-04 ENCOUNTER — Ambulatory Visit: Payer: Medicare Other | Admitting: Nurse Practitioner

## 2018-06-04 ENCOUNTER — Other Ambulatory Visit: Payer: Self-pay

## 2018-06-04 ENCOUNTER — Other Ambulatory Visit: Payer: Self-pay | Admitting: Family Medicine

## 2018-06-04 ENCOUNTER — Encounter
Admission: RE | Admit: 2018-06-04 | Discharge: 2018-06-04 | Disposition: A | Discharge status: Home / Self Care | Payer: Medicare Other | Source: Ambulatory Visit | Attending: Vascular Surgery | Admitting: Vascular Surgery

## 2018-06-04 HISTORY — DX: Cardiac murmur, unspecified: R01.1

## 2018-06-04 HISTORY — DX: Personal history of other diseases of the digestive system: Z87.19

## 2018-06-04 HISTORY — DX: Sleep apnea, unspecified: G47.30

## 2018-06-04 HISTORY — DX: Unspecified dementia, unspecified severity, without behavioral disturbance, psychotic disturbance, mood disturbance, and anxiety: F03.90

## 2018-06-04 HISTORY — DX: Cerebral infarction, unspecified: I63.9

## 2018-06-04 LAB — CBC WITH DIFFERENTIAL/PLATELET
BASOS ABS: 0.1 10*3/uL (ref 0–0.1)
Basophils Relative: 1 %
Eosinophils Absolute: 0.3 10*3/uL (ref 0–0.7)
Eosinophils Relative: 4 %
HEMATOCRIT: 39.1 % — AB (ref 40.0–52.0)
Hemoglobin: 13.2 g/dL (ref 13.0–18.0)
LYMPHS PCT: 31 %
Lymphs Abs: 2.6 10*3/uL (ref 1.0–3.6)
MCH: 30 pg (ref 26.0–34.0)
MCHC: 33.8 g/dL (ref 32.0–36.0)
MCV: 88.9 fL (ref 80.0–100.0)
MONO ABS: 0.8 10*3/uL (ref 0.2–1.0)
Monocytes Relative: 10 %
NEUTROS ABS: 4.5 10*3/uL (ref 1.4–6.5)
Neutrophils Relative %: 54 %
Platelets: 315 10*3/uL (ref 150–440)
RBC: 4.4 MIL/uL (ref 4.40–5.90)
RDW: 15.3 % — ABNORMAL HIGH (ref 11.5–14.5)
WBC: 8.2 10*3/uL (ref 3.8–10.6)

## 2018-06-04 LAB — BASIC METABOLIC PANEL
Anion gap: 8 (ref 5–15)
BUN: 15 mg/dL (ref 8–23)
CHLORIDE: 102 mmol/L (ref 98–111)
CO2: 27 mmol/L (ref 22–32)
Calcium: 9.4 mg/dL (ref 8.9–10.3)
Creatinine, Ser: 1.15 mg/dL (ref 0.61–1.24)
GFR calc Af Amer: >60 mL/min (ref >60)
GFR, EST NON AFRICAN AMERICAN: 55 mL/min — AB (ref >60)
GLUCOSE: 88 mg/dL (ref 70–99)
POTASSIUM: 4.6 mmol/L (ref 3.5–5.1)
Sodium: 137 mmol/L (ref 135–145)

## 2018-06-04 LAB — PROTIME-INR
INR: 1.07
Prothrombin Time: 13.8 seconds (ref 11.4–15.2)

## 2018-06-04 LAB — APTT: APTT: 33 s (ref 24–36)

## 2018-06-04 LAB — TYPE AND SCREEN
ABO/RH(D): O POS
Antibody Screen: NEGATIVE

## 2018-06-04 MED ORDER — CLINDAMYCIN PHOSPHATE 900 MG/50ML IV SOLN
900.0000 mg | INTRAVENOUS | Status: AC
  Administered 2018-06-05: 900 mg via INTRAVENOUS
  Filled 2018-06-04: qty 50

## 2018-06-04 NOTE — Pre-Procedure Instructions (Signed)
Significant other and son attended the preadmit testing appointment today and were able to correct information regarding surgery and medical history. They have received instructions regarding medicines to take in the morning and arrival times, etc.  They had questions answered and felt comfortable with the information they received.

## 2018-06-05 ENCOUNTER — Encounter
Admission: RE | Disposition: A | Discharge status: Home / Self Care | Payer: Self-pay | Source: Home / Self Care | Attending: Vascular Surgery

## 2018-06-05 ENCOUNTER — Encounter: Payer: Self-pay | Admitting: Emergency Medicine

## 2018-06-05 ENCOUNTER — Inpatient Hospital Stay
Admission: RE | Admit: 2018-06-05 | Discharge: 2018-06-06 | DRG: 039 | Disposition: A | Discharge status: Home / Self Care | Payer: Medicare Other | Attending: Vascular Surgery | Admitting: Vascular Surgery

## 2018-06-05 ENCOUNTER — Other Ambulatory Visit: Payer: Self-pay

## 2018-06-05 DIAGNOSIS — F0390 Unspecified dementia without behavioral disturbance: Secondary | ICD-10-CM | POA: Diagnosis present

## 2018-06-05 DIAGNOSIS — Z9852 Vasectomy status: Secondary | ICD-10-CM

## 2018-06-05 DIAGNOSIS — R351 Nocturia: Secondary | ICD-10-CM | POA: Diagnosis present

## 2018-06-05 DIAGNOSIS — N401 Enlarged prostate with lower urinary tract symptoms: Secondary | ICD-10-CM | POA: Diagnosis present

## 2018-06-05 DIAGNOSIS — Z86010 Personal history of colonic polyps: Secondary | ICD-10-CM

## 2018-06-05 DIAGNOSIS — Z955 Presence of coronary angioplasty implant and graft: Secondary | ICD-10-CM | POA: Diagnosis not present

## 2018-06-05 DIAGNOSIS — Z961 Presence of intraocular lens: Secondary | ICD-10-CM | POA: Diagnosis present

## 2018-06-05 DIAGNOSIS — I6521 Occlusion and stenosis of right carotid artery: Secondary | ICD-10-CM

## 2018-06-05 DIAGNOSIS — Z96652 Presence of left artificial knee joint: Secondary | ICD-10-CM | POA: Diagnosis present

## 2018-06-05 DIAGNOSIS — I10 Essential (primary) hypertension: Secondary | ICD-10-CM | POA: Diagnosis present

## 2018-06-05 DIAGNOSIS — Z888 Allergy status to other drugs, medicaments and biological substances status: Secondary | ICD-10-CM

## 2018-06-05 DIAGNOSIS — I252 Old myocardial infarction: Secondary | ICD-10-CM

## 2018-06-05 DIAGNOSIS — Z7984 Long term (current) use of oral hypoglycemic drugs: Secondary | ICD-10-CM

## 2018-06-05 DIAGNOSIS — I2510 Atherosclerotic heart disease of native coronary artery without angina pectoris: Secondary | ICD-10-CM | POA: Diagnosis present

## 2018-06-05 DIAGNOSIS — Z7902 Long term (current) use of antithrombotics/antiplatelets: Secondary | ICD-10-CM

## 2018-06-05 DIAGNOSIS — I6523 Occlusion and stenosis of bilateral carotid arteries: Principal | ICD-10-CM | POA: Diagnosis present

## 2018-06-05 DIAGNOSIS — Z820 Family history of epilepsy and other diseases of the nervous system: Secondary | ICD-10-CM

## 2018-06-05 DIAGNOSIS — Z8249 Family history of ischemic heart disease and other diseases of the circulatory system: Secondary | ICD-10-CM

## 2018-06-05 DIAGNOSIS — Z87891 Personal history of nicotine dependence: Secondary | ICD-10-CM

## 2018-06-05 DIAGNOSIS — Z800 Family history of malignant neoplasm of digestive organs: Secondary | ICD-10-CM

## 2018-06-05 DIAGNOSIS — Z8041 Family history of malignant neoplasm of ovary: Secondary | ICD-10-CM

## 2018-06-05 DIAGNOSIS — Z8042 Family history of malignant neoplasm of prostate: Secondary | ICD-10-CM | POA: Diagnosis not present

## 2018-06-05 DIAGNOSIS — G4730 Sleep apnea, unspecified: Secondary | ICD-10-CM | POA: Diagnosis present

## 2018-06-05 DIAGNOSIS — Z823 Family history of stroke: Secondary | ICD-10-CM | POA: Diagnosis not present

## 2018-06-05 DIAGNOSIS — G4700 Insomnia, unspecified: Secondary | ICD-10-CM | POA: Diagnosis present

## 2018-06-05 DIAGNOSIS — E119 Type 2 diabetes mellitus without complications: Secondary | ICD-10-CM | POA: Diagnosis present

## 2018-06-05 DIAGNOSIS — Z85828 Personal history of other malignant neoplasm of skin: Secondary | ICD-10-CM

## 2018-06-05 DIAGNOSIS — Z8673 Personal history of transient ischemic attack (TIA), and cerebral infarction without residual deficits: Secondary | ICD-10-CM | POA: Diagnosis not present

## 2018-06-05 DIAGNOSIS — Z9842 Cataract extraction status, left eye: Secondary | ICD-10-CM | POA: Diagnosis not present

## 2018-06-05 DIAGNOSIS — Z881 Allergy status to other antibiotic agents status: Secondary | ICD-10-CM

## 2018-06-05 DIAGNOSIS — E785 Hyperlipidemia, unspecified: Secondary | ICD-10-CM | POA: Diagnosis present

## 2018-06-05 DIAGNOSIS — Z8049 Family history of malignant neoplasm of other genital organs: Secondary | ICD-10-CM

## 2018-06-05 DIAGNOSIS — Z880 Allergy status to penicillin: Secondary | ICD-10-CM

## 2018-06-05 DIAGNOSIS — Z79899 Other long term (current) drug therapy: Secondary | ICD-10-CM

## 2018-06-05 DIAGNOSIS — Z7982 Long term (current) use of aspirin: Secondary | ICD-10-CM

## 2018-06-05 DIAGNOSIS — I63239 Cerebral infarction due to unspecified occlusion or stenosis of unspecified carotid arteries: Secondary | ICD-10-CM | POA: Diagnosis present

## 2018-06-05 HISTORY — PX: CAROTID PTA/STENT INTERVENTION: CATH118231

## 2018-06-05 LAB — PREPARE RBC (CROSSMATCH)

## 2018-06-05 LAB — GLUCOSE, CAPILLARY
Glucose-Capillary: 109 mg/dL — ABNORMAL HIGH (ref 70–99)
Glucose-Capillary: 132 mg/dL — ABNORMAL HIGH (ref 70–99)

## 2018-06-05 LAB — MRSA PCR SCREENING: MRSA by PCR: NEGATIVE

## 2018-06-05 LAB — ABO/RH: ABO/RH(D): O POS

## 2018-06-05 SURGERY — CAROTID PTA/STENT INTERVENTION
Anesthesia: Moderate Sedation | Laterality: Right

## 2018-06-05 MED ORDER — DOXAZOSIN MESYLATE 1 MG PO TABS
1.0000 mg | ORAL_TABLET | Freq: Every day | ORAL | Status: DC
  Administered 2018-06-06: 1 mg via ORAL
  Filled 2018-06-05: qty 1

## 2018-06-05 MED ORDER — INSULIN ASPART 100 UNIT/ML ~~LOC~~ SOLN
0.0000 [IU] | Freq: Three times a day (TID) | SUBCUTANEOUS | Status: DC
  Administered 2018-06-06: 1 [IU] via SUBCUTANEOUS
  Filled 2018-06-05: qty 1

## 2018-06-05 MED ORDER — NITROGLYCERIN 0.4 MG SL SUBL: 0.4000 mg | SUBLINGUAL_TABLET | SUBLINGUAL | Status: DC | PRN

## 2018-06-05 MED ORDER — SODIUM CHLORIDE 0.9 % IV SOLN
INTRAVENOUS | Status: DC
  Administered 2018-06-05: 14:00:00 via INTRAVENOUS

## 2018-06-05 MED ORDER — PRAVASTATIN SODIUM 20 MG PO TABS
40.0000 mg | ORAL_TABLET | Freq: Every day | ORAL | Status: DC
  Administered 2018-06-05: 40 mg via ORAL
  Filled 2018-06-05: qty 2

## 2018-06-05 MED ORDER — OXYCODONE-ACETAMINOPHEN 5-325 MG PO TABS: 1.0000 | ORAL_TABLET | ORAL | Status: DC | PRN

## 2018-06-05 MED ORDER — FENTANYL CITRATE (PF) 100 MCG/2ML IJ SOLN
INTRAMUSCULAR | Status: DC | PRN
  Administered 2018-06-05: 25 ug via INTRAVENOUS

## 2018-06-05 MED ORDER — ATROPINE SULFATE 1 MG/10ML IJ SOSY
PREFILLED_SYRINGE | INTRAMUSCULAR | Status: AC
  Filled 2018-06-05: qty 10

## 2018-06-05 MED ORDER — VANCOMYCIN HCL IN DEXTROSE 1-5 GM/200ML-% IV SOLN
1000.0000 mg | Freq: Two times a day (BID) | INTRAVENOUS | Status: DC
  Administered 2018-06-05: 1000 mg via INTRAVENOUS
  Filled 2018-06-05 (×2): qty 200

## 2018-06-05 MED ORDER — PHENYLEPHRINE HCL 10 MG/ML IJ SOLN
INTRAMUSCULAR | Status: AC
  Filled 2018-06-05: qty 1

## 2018-06-05 MED ORDER — LABETALOL HCL 5 MG/ML IV SOLN: 10.0000 mg | INTRAVENOUS | Status: DC | PRN

## 2018-06-05 MED ORDER — FINASTERIDE 5 MG PO TABS
5.0000 mg | ORAL_TABLET | Freq: Every day | ORAL | Status: DC
  Administered 2018-06-05: 5 mg via ORAL
  Filled 2018-06-05: qty 1

## 2018-06-05 MED ORDER — ACETAMINOPHEN 325 MG PO TABS: 325.0000 mg | ORAL_TABLET | ORAL | Status: DC | PRN

## 2018-06-05 MED ORDER — HEPARIN SODIUM (PORCINE) 1000 UNIT/ML IJ SOLN
INTRAMUSCULAR | Status: AC
  Filled 2018-06-05: qty 1

## 2018-06-05 MED ORDER — CLONAZEPAM 0.5 MG PO TABS
0.5000 mg | ORAL_TABLET | Freq: Every day | ORAL | Status: DC
  Administered 2018-06-05: 0.5 mg via ORAL
  Filled 2018-06-05: qty 1

## 2018-06-05 MED ORDER — HEPARIN (PORCINE) IN NACL 1000-0.9 UT/500ML-% IV SOLN
INTRAVENOUS | Status: AC
  Filled 2018-06-05: qty 1000

## 2018-06-05 MED ORDER — ALUM & MAG HYDROXIDE-SIMETH 200-200-20 MG/5ML PO SUSP
15.0000 mL | ORAL | Status: DC | PRN
  Filled 2018-06-05: qty 30

## 2018-06-05 MED ORDER — LIDOCAINE-EPINEPHRINE (PF) 1 %-1:200000 IJ SOLN
INTRAMUSCULAR | Status: AC
  Filled 2018-06-05: qty 30

## 2018-06-05 MED ORDER — PANTOPRAZOLE SODIUM 40 MG PO TBEC
40.0000 mg | DELAYED_RELEASE_TABLET | Freq: Two times a day (BID) | ORAL | Status: DC
  Administered 2018-06-05 – 2018-06-06 (×2): 40 mg via ORAL
  Filled 2018-06-05 (×2): qty 1

## 2018-06-05 MED ORDER — CYANOCOBALAMIN 500 MCG PO TABS
500.0000 ug | ORAL_TABLET | Freq: Every day | ORAL | Status: DC
Start: 2018-06-05 — End: 2018-06-06
  Administered 2018-06-06: 500 ug via ORAL
  Filled 2018-06-05 (×2): qty 1

## 2018-06-05 MED ORDER — GUAIFENESIN-DM 100-10 MG/5ML PO SYRP
15.0000 mL | ORAL_SOLUTION | ORAL | Status: DC | PRN
  Filled 2018-06-05: qty 15

## 2018-06-05 MED ORDER — MIDAZOLAM HCL 5 MG/5ML IJ SOLN
INTRAMUSCULAR | Status: AC
  Filled 2018-06-05: qty 5

## 2018-06-05 MED ORDER — CLOPIDOGREL BISULFATE 75 MG PO TABS
75.0000 mg | ORAL_TABLET | Freq: Every day | ORAL | Status: DC
  Administered 2018-06-05: 75 mg via ORAL
  Filled 2018-06-05: qty 1

## 2018-06-05 MED ORDER — MEMANTINE HCL 5 MG PO TABS
5.0000 mg | ORAL_TABLET | Freq: Two times a day (BID) | ORAL | Status: DC
  Administered 2018-06-05 – 2018-06-06 (×2): 5 mg via ORAL
  Filled 2018-06-05 (×3): qty 1

## 2018-06-05 MED ORDER — HEPARIN SODIUM (PORCINE) 1000 UNIT/ML IJ SOLN
INTRAMUSCULAR | Status: DC | PRN
  Administered 2018-06-05: 1000 [IU] via INTRAVENOUS
  Administered 2018-06-05: 7000 [IU] via INTRAVENOUS

## 2018-06-05 MED ORDER — ROPINIROLE HCL 0.25 MG PO TABS
0.2500 mg | ORAL_TABLET | Freq: Every day | ORAL | Status: DC
  Administered 2018-06-05: 0.25 mg via ORAL
  Filled 2018-06-05 (×2): qty 1

## 2018-06-05 MED ORDER — SODIUM CHLORIDE 0.9% IV SOLUTION: Freq: Once | INTRAVENOUS | Status: DC

## 2018-06-05 MED ORDER — ATROPINE SULFATE 1 MG/10ML IJ SOSY
PREFILLED_SYRINGE | INTRAMUSCULAR | Status: DC | PRN
  Administered 2018-06-05: .4 mg via INTRAVENOUS

## 2018-06-05 MED ORDER — FAMOTIDINE IN NACL 20-0.9 MG/50ML-% IV SOLN: 20.0000 mg | Freq: Two times a day (BID) | INTRAVENOUS | Status: DC

## 2018-06-05 MED ORDER — PRAVASTATIN SODIUM 20 MG PO TABS: 40.0000 mg | ORAL_TABLET | Freq: Every day | ORAL | Status: DC

## 2018-06-05 MED ORDER — POTASSIUM CHLORIDE CRYS ER 20 MEQ PO TBCR: 20.0000 meq | EXTENDED_RELEASE_TABLET | Freq: Every day | ORAL | Status: DC | PRN

## 2018-06-05 MED ORDER — HYDRALAZINE HCL 20 MG/ML IJ SOLN: 5.0000 mg | INTRAMUSCULAR | Status: DC | PRN

## 2018-06-05 MED ORDER — METOPROLOL TARTRATE 5 MG/5ML IV SOLN: 2.0000 mg | INTRAVENOUS | Status: DC | PRN

## 2018-06-05 MED ORDER — ACETAMINOPHEN 325 MG RE SUPP
325.0000 mg | RECTAL | Status: DC | PRN
  Filled 2018-06-05: qty 2

## 2018-06-05 MED ORDER — ONDANSETRON HCL 4 MG/2ML IJ SOLN: 4.0000 mg | Freq: Four times a day (QID) | INTRAMUSCULAR | Status: DC | PRN

## 2018-06-05 MED ORDER — CHLORHEXIDINE GLUCONATE CLOTH 2 % EX PADS
6.0000 | MEDICATED_PAD | Freq: Once | CUTANEOUS | Status: DC
  Administered 2018-06-05: 6 via TOPICAL

## 2018-06-05 MED ORDER — MEMANTINE HCL 5 MG PO TABS: 5.0000 mg | ORAL_TABLET | Freq: Every day | ORAL | Status: DC

## 2018-06-05 MED ORDER — MORPHINE SULFATE (PF) 4 MG/ML IV SOLN: 2.0000 mg | INTRAVENOUS | Status: DC | PRN

## 2018-06-05 MED ORDER — MAGNESIUM SULFATE 2 GM/50ML IV SOLN: 2.0000 g | Freq: Every day | INTRAVENOUS | Status: DC | PRN

## 2018-06-05 MED ORDER — SODIUM CHLORIDE 0.9 % IV SOLN: 500.0000 mL | Freq: Once | INTRAVENOUS | Status: DC | PRN

## 2018-06-05 MED ORDER — FENTANYL CITRATE (PF) 100 MCG/2ML IJ SOLN
INTRAMUSCULAR | Status: AC
  Filled 2018-06-05: qty 2

## 2018-06-05 MED ORDER — PHENOL 1.4 % MT LIQD
1.0000 | OROMUCOSAL | Status: DC | PRN
  Filled 2018-06-05: qty 177

## 2018-06-05 MED ORDER — IOPAMIDOL (ISOVUE-300) INJECTION 61%
INTRAVENOUS | Status: DC | PRN
  Administered 2018-06-05: 50 mL via INTRA_ARTERIAL

## 2018-06-05 MED ORDER — ASPIRIN EC 81 MG PO TBEC
81.0000 mg | DELAYED_RELEASE_TABLET | Freq: Every morning | ORAL | Status: DC
  Administered 2018-06-06: 81 mg via ORAL
  Filled 2018-06-05: qty 1

## 2018-06-05 MED ORDER — CHLORHEXIDINE GLUCONATE CLOTH 2 % EX PADS: 6.0000 | MEDICATED_PAD | Freq: Once | CUTANEOUS | Status: DC

## 2018-06-05 MED ORDER — MIDAZOLAM HCL 2 MG/2ML IJ SOLN
INTRAMUSCULAR | Status: DC | PRN
  Administered 2018-06-05: 1 mg via INTRAVENOUS

## 2018-06-05 MED ORDER — ADULT MULTIVITAMIN W/MINERALS CH
1.0000 | ORAL_TABLET | Freq: Every day | ORAL | Status: DC
  Administered 2018-06-06: 1 via ORAL
  Filled 2018-06-05: qty 1

## 2018-06-05 MED ORDER — SODIUM CHLORIDE 0.9 % IV SOLN: INTRAVENOUS | Status: DC

## 2018-06-05 MED ORDER — INSULIN ASPART 100 UNIT/ML ~~LOC~~ SOLN: 0.0000 [IU] | Freq: Every day | SUBCUTANEOUS | Status: DC

## 2018-06-05 SURGICAL SUPPLY — 23 items
BALLN VIATRAC 5X20X135 (BALLOONS) ×3
BALLOON VIATRAC 5X20X135 (BALLOONS) ×1 IMPLANT
CATH ANGIO 5F 100CM .035 PIG (CATHETERS) ×3 IMPLANT
CATH BEACON 5 .035 100 H1 TIP (CATHETERS) ×3 IMPLANT
CATH BEACON 5 .035 100 JB2 TIP (CATHETERS) ×3 IMPLANT
CATH G 5FX100 (CATHETERS) ×3 IMPLANT
CATH VTK 5FR 125CM BEACON TIP (CATHETERS) ×3 IMPLANT
DEVICE EMBOSHIELD NAV6 4.0-7.0 (FILTER) ×6 IMPLANT
DEVICE PRESTO INFLATION (MISCELLANEOUS) ×6 IMPLANT
DEVICE STARCLOSE SE CLOSURE (Vascular Products) ×3 IMPLANT
DEVICE TORQUE .025-.038 (MISCELLANEOUS) ×3 IMPLANT
GLIDECATH F4/38/100ST (CATHETERS) ×3 IMPLANT
GLIDEWIRE ADV .035X260CM (WIRE) ×3 IMPLANT
GUIDEWIRE ANGLED .035X260CM (WIRE) ×3 IMPLANT
KIT CAROTID MANIFOLD (MISCELLANEOUS) ×3 IMPLANT
PACK ANGIOGRAPHY (CUSTOM PROCEDURE TRAY) ×3 IMPLANT
SHEATH BRITE TIP 6FRX11 (SHEATH) ×3 IMPLANT
SHEATH SHUTTLE 6FR (SHEATH) ×3 IMPLANT
STENT XACT CAR 9-7X40X136 (Permanent Stent) ×3 IMPLANT
TUBING CIL FLEX 10 FLL-RA (TUBING) ×3 IMPLANT
WIRE AQUATRACK .035X260CM (WIRE) ×3 IMPLANT
WIRE G VAS 035X260 STIFF (WIRE) ×3 IMPLANT
WIRE J 3MM .035X145CM (WIRE) ×3 IMPLANT

## 2018-06-05 NOTE — Progress Notes (Signed)
As family was leaving room, they mentioned that patient was complaining of pain in his left arm. This RN went in to check on patient, pt reporting burning and pain above IV site. No swelling present, however redness and rash to left forearm above IV site. Vancomycin stopped, Dr. Lucky Cowboy notified. IV flushed, patient reports relief of pain. Order received to discontinue vancomycin order. Will monitor patient and IV site.

## 2018-06-05 NOTE — Op Note (Signed)
OPERATIVE NOTE DATE: 06/05/2018  PROCEDURE: 1.  Ultrasound guidance for vascular access right femoral artery 2.  Placement of a 9 mm proximal - 7 mm distal 4 cm Exact stent with the use of the NAV-6 embolic protection device in the right carotid artery  PRE-OPERATIVE DIAGNOSIS: 1. High grade right carotid artery stenosis. 2. Left carotid occlusion 3. Previous stroke and recurring TIA  POST-OPERATIVE DIAGNOSIS:  Same as above  SURGEON: Leotis Pain, MD  ASSISTANT(S):  none  ANESTHESIA: local/MCS  ESTIMATED BLOOD LOSS:  50 cc  CONTRAST: 50 cc  FLUORO TIME: 14.5 minutes  MODERATE CONSCIOUS SEDATION TIME:  Approximately 50 minutes using 1 mg of Versed and 25 mcg of Fentanyl  FINDING(S): 1.   80-85% right carotid artery stenosis  SPECIMEN(S):   none  INDICATIONS:   Patient is a 84 y.o. male who presents with previous stroke earlier this year and recurrent TIA symptoms.  He has a left carotid occlusion and a high grade right carotid artery stenosis.  The patient has contralateral occlusion and would also be high risk for general anesthesia due to his comorbidities and carotid artery stenting was felt to be preferred to endarterectomy for that reason.  Risks and benefits were discussed and informed consent was obtained.   DESCRIPTION: After obtaining full informed written consent, the patient was brought back to the vascular suite and placed supine upon the table.  The patient received IV antibiotics prior to induction. Moderate conscious sedation was administered during a face to face encounter with the patient throughout the procedure with my supervision of the RN administering medicines and monitoring the patients vital signs and mental status throughout from the start of the procedure until the patient was taken to the recovery room.  After obtaining adequate anesthesia, the patient was prepped and draped in the standard fashion.   The right femoral artery was visualized with  ultrasound and found to be widely patent. It was then accessed under direct ultrasound guidance without difficulty with a Seldinger needle. A permanent image was recorded. A J-wire was placed and we then placed a 6 French sheath. The patient was then heparinized and a total of 8000 units of intravenous heparin were given and an ACT was checked to confirm successful anticoagulation. A pigtail catheter was then placed into the ascending aorta. 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 I then selectively cannulated the innominate artery without difficulty with a JB2 catheter but was unable to get this to advance.  I also tried a Bentson Hanafee catheter and a Simmons 1 catheter but despite good wire purchase, advancing the catheter became quite difficult.  I then elected to telescope with the 6 [place] shuttle sheath and a VTEK catheter.  I was able to cannulate the innominate artery and advance an advantage wire up into the distal common carotid artery.  This allowed the catheter and the sheath to gain purchase and advanced into the mid right common carotid artery.  The diagnostic catheter was removed and the shuttle sheath was parked in the mid to distal right common carotid artery cervical and cerebral carotid angiography was then performed. There were no obvious intracranial filling defects with brisk cross-filling right to left which would be expected with a contralateral occlusion. The carotid bifurcation demonstrated a moderately calcific lesion in the 80 to 85% range that was highly irregular.  The external carotid  artery had essentially no flow. I then used the NAV-6  Embolic protection device and crossed the lesion and parked this in the distal internal carotid artery at the base of the skull.  I then selected a 9 mm proximal, 7 mm distal 4 cm long exact stent. This was deployed across  the lesion encompassing it in its entirety. A 5 mm diameter by 2 cm length balloon was used to post dilate the stent. Only about a 15-20 % residual stenosis was present after angioplasty. Completion angiogram showed normal intracranial filling without new defects.  There was still brisk cross-filling right to left.  At this point I elected to terminate the procedure. The sheath was removed and StarClose closure device was deployed in the right femoral artery with excellent hemostatic result. The patient was taken to the recovery room in stable condition having tolerated the procedure well.  COMPLICATIONS: none  CONDITION: stable  Leotis Pain 06/05/2018 12:25 PM   This note was created with Dragon Medical transcription system. Any errors in dictation are purely unintentional.

## 2018-06-05 NOTE — H&P (Signed)
[facility] History & Physical Update  The patient was interviewed and re-examined.  The patient's previous History and Physical has been reviewed and is unchanged.  There is no change in the plan of care. We plan to proceed with the scheduled procedure.  Leotis Pain, MD  06/05/2018, 9:33 AM

## 2018-06-06 LAB — BASIC METABOLIC PANEL
ANION GAP: 5 (ref 5–15)
BUN: 12 mg/dL (ref 8–23)
CHLORIDE: 109 mmol/L (ref 98–111)
CO2: 26 mmol/L (ref 22–32)
CREATININE: 1.12 mg/dL (ref 0.61–1.24)
Calcium: 8.4 mg/dL — ABNORMAL LOW (ref 8.9–10.3)
GFR calc non Af Amer: 57 mL/min — ABNORMAL LOW (ref >60)
Glucose, Bld: 115 mg/dL — ABNORMAL HIGH (ref 70–99)
Potassium: 4 mmol/L (ref 3.5–5.1)
Sodium: 140 mmol/L (ref 135–145)

## 2018-06-06 LAB — CBC
HEMATOCRIT: 34.1 % — AB (ref 40.0–52.0)
Hemoglobin: 11.4 g/dL — ABNORMAL LOW (ref 13.0–18.0)
MCH: 29.8 pg (ref 26.0–34.0)
MCHC: 33.5 g/dL (ref 32.0–36.0)
MCV: 89 fL (ref 80.0–100.0)
Platelets: 279 10*3/uL (ref 150–440)
RBC: 3.83 MIL/uL — ABNORMAL LOW (ref 4.40–5.90)
RDW: 15.5 % — AB (ref 11.5–14.5)
WBC: 7 10*3/uL (ref 3.8–10.6)

## 2018-06-06 LAB — GLUCOSE, CAPILLARY
GLUCOSE-CAPILLARY: 82 mg/dL (ref 70–99)
GLUCOSE-CAPILLARY: 98 mg/dL (ref 70–99)
GLUCOSE-CAPILLARY: 149 mg/dL — AB (ref 70–99)
Glucose-Capillary: 109 mg/dL — ABNORMAL HIGH (ref 70–99)

## 2018-06-06 LAB — POCT ACTIVATED CLOTTING TIME: Activated Clotting Time: 279 seconds

## 2018-06-06 MED ORDER — TRAMADOL HCL 50 MG PO TABS: 50.0000 mg | ORAL_TABLET | Freq: Four times a day (QID) | ORAL | 0 refills | Status: DC | PRN

## 2018-06-06 NOTE — Plan of Care (Signed)
Patient discharged to home.  Vital signs stable on room air.  Patient ambulatory, up to chair.  Vascular site clean, dry, intact, and at level 0.  IV sites discontinued, and bleeding controlled.  Patient will leave the hospital by car with his family.  Cameron Ali, RN

## 2018-06-06 NOTE — Discharge Summary (Addendum)
[facility]    Discharge Summary  Patient ID:  Stephen [name] MRN: [id number] DOB/AGE: Feb 18, 1927 84 y.o.  Admit date: 06/05/2018 Discharge date: 06/06/2018 Date of Surgery: 06/05/2018 Surgeon: Surgeon(s): Algernon Huxley, MD  Admission Diagnosis: Carotid stenosis, symptomatic, with infarction [facility]) [I63.239]  Discharge Diagnoses:  Carotid stenosis, symptomatic, with infarction [facility]) [I63.239]  Secondary Diagnoses: Past Medical History:  Diagnosis Date  . Arthritis of knee   . BPH (benign prostatic hypertrophy)    w/ nocturia  . Coronary artery disease 03/2014   Inferior ST elevation myocardial infarction. Cardiac catheterization showed an occluded mid RCA. He had an angioplasty and drug-eluting stent placement with a 3.0 x 16 mm Promus drug-eluting stent. Ejection fraction was 45% by echo, completed cardiac rehab 06/2014  . Dementia    s/p stroke  . Diabetes type 2, controlled ([place])   . Heart murmur    keeping an eye on it but not treating  . History of basal cell cancer    s/p mohs  . History of colon polyps   . History of hiatal hernia 05/2018   this causes laryngeal spasms. pt was taking nitro for this when bp drops and he ends up with tia symptoms  . History of hypertension   . HLD (hyperlipidemia)    diet controlled in past  . Hypertension   . Insomnia    treated with multiple meds in past  . MI (myocardial infarction) ([place]) 2015  . Rosacea   . Skin cancer   . Sleep apnea    has cpap but does not use properly and does not like it!!  . Stroke ([place]) 03/2018   per cat scan, patient has had several strokes  . UTI (urinary tract infection)    Procedure(s): CAROTID PTA/STENT INTERVENTION  Discharged Condition: good  HPI:  Patient is a 84 year old male who presents with previous stroke earlier this year and recurrent TIA symptoms.  He has a left carotid occlusion and a high grade right carotid artery stenosis.  The patient has  contralateral occlusion and would also be high risk for general anesthesia due to his comorbidities. Carotid artery stenting was felt to be preferred to endarterectomy for that reason.  On June 05, 2018, the patient underwent Ultrasound guidance for vascular access right femoral artery, placement of a 9 mm proximal - 7 mm distal 4 cm Exact stent with the use of the NAV-6 embolic protection device in the right carotid artery.  The patient tolerated the procedure bilateral was transferred to the ICU for observation.  The patient's night of surgery was unremarkable.  During the patient's brief inpatient stay, his diet was advanced, he was urinating without issue, his pain was controlled to the use of p.o. pain medication and he was ambulating independently.  The patient is to continue taking aspirin 81mg  PO daily and Plavix 75mg  PO daily on discharge.  Patient is to continue his speech therapy upon discharge.   Hospital Course:  Stephen [name] is a 84 y.o. male is S/P:   Procedure(s): RIGHT CAROTID PTA/STENT INTERVENTION  Extubated: POD # 0  Physical exam:  A&Ox3 NAD CV: RRR Pulm: CTA Bilaterally Abdomen: Soft. Non-tender, Non-distended, (+) Bowel Sounds Bilateral Groin: Access sites clean, dry and intact Bilateral Lower Extremity: Warm, Non-tender, Minimal edema, Brisk capillary refill  Post-op wounds clean, dry, intact or healing well  Pt. Ambulating, voiding and taking PO diet without difficulty.  Pt pain controlled with PO pain meds.  Labs as below  Complications:none  Consults: None  Significant Diagnostic Studies: CBC Lab Results  Component Value Date   WBC 7.0 06/06/2018   HGB 11.4 (L) 06/06/2018   HCT 34.1 (L) 06/06/2018   MCV 89.0 06/06/2018   PLT 279 06/06/2018   BMET    Component Value Date/Time   NA 140 06/06/2018 0545   NA 138 03/23/2014 1612   K 4.0 06/06/2018 0545   K 4.3 03/23/2014 1612   K 5.1 05/22/2013   CL 109 06/06/2018 0545   CL 105  03/23/2014 1612   CO2 26 06/06/2018 0545   CO2 29 03/23/2014 1612   GLUCOSE 115 (H) 06/06/2018 0545   GLUCOSE 128 (H) 03/23/2014 1612   BUN 12 06/06/2018 0545   BUN 22 (H) 03/23/2014 1612   CREATININE 1.12 06/06/2018 0545   CREATININE 1.30 03/23/2014 1612   CREATININE 1.14 05/22/2013   CALCIUM 8.4 (L) 06/06/2018 0545   CALCIUM 9.2 03/23/2014 1612   GFRNONAA 57 (L) 06/06/2018 0545   GFRNONAA 50 (L) 03/23/2014 1612   GFRAA >60 06/06/2018 0545   GFRAA 58 (L) 03/23/2014 1612   COAG Lab Results  Component Value Date   INR 1.07 06/04/2018   INR 1.06 04/26/2018   INR 1.05 03/15/2018   Disposition:  Discharge to :Home  Allergies as of 06/06/2018      Reactions   Aricept [donepezil Hcl] Diarrhea   severe   Belsomra [suvorexant] Other (See Comments)   Anxiety. Did not work   Vancomycin Itching, Rash, Other (See Comments)   Burning pain    Ambien [zolpidem Tartrate] Other (See Comments)   Gi problems   Atorvastatin    muscle aches   Doxycycline Rash   Penicillins Hives   Has patient had a PCN reaction causing immediate rash, facial/tongue/throat swelling, SOB or lightheadedness with hypotension: Yes Has patient had a PCN reaction causing severe rash involving mucus membranes or skin necrosis: Yes Has patient had a PCN reaction that required hospitalization No Has patient had a PCN reaction occurring within the last 10 years: No If all of the above answers are "NO", then may proceed with Cephalosporin use.   Tamsulosin Other (See Comments)   Leg weakness.       Medication List    TAKE these medications   aspirin EC 81 MG tablet Take 81 mg by mouth every morning.   clonazePAM 0.5 MG tablet Commonly known as:  KLONOPIN Take 0.5 mg by mouth at bedtime.   clopidogrel 75 MG tablet Commonly known as:  PLAVIX Take 1 tablet (75 mg total) by mouth at bedtime.   doxazosin 1 MG tablet Commonly known as:  CARDURA TAKE 1 TABLET BY MOUTH EVERY DAY   finasteride 5 MG  tablet Commonly known as:  PROSCAR Take 5 mg by mouth at bedtime.   memantine 5 MG tablet Commonly known as:  NAMENDA Take 5 mg by mouth 2 (two) times daily.   metFORMIN 500 MG tablet Commonly known as:  GLUCOPHAGE TAKE 1 TABLET (500 MG TOTAL) BY MOUTH 2 (TWO) TIMES DAILY WITH A MEAL.   multivitamin with minerals Tabs tablet Take 1 tablet by mouth daily.   naproxen sodium 220 MG tablet Commonly known as:  ALEVE Take 220 mg by mouth daily as needed (pain).   nitroGLYCERIN 0.4 MG SL tablet Commonly known as:  NITROSTAT Place 1 tablet (0.4 mg total) under the tongue every 5 (five) minutes as needed for chest pain.   pantoprazole 40 MG tablet  Commonly known as:  PROTONIX Take 1 tablet (40 mg total) by mouth daily. What changed:  when to take this   pravastatin 40 MG tablet Commonly known as:  PRAVACHOL TAKE 1 TABLET (40 MG TOTAL) BY MOUTH DAILY.   rOPINIRole 0.25 MG tablet Commonly known as:  REQUIP Take 1 tablet (0.25 mg total) by mouth at bedtime.   traMADol 50 MG tablet Commonly known as:  ULTRAM Take 1 tablet (50 mg total) by mouth every 6 (six) hours as needed for moderate pain or severe pain.   vitamin B-12 500 MCG tablet Commonly known as:  CYANOCOBALAMIN Take 500 mcg by mouth daily.      Patient is to continue his speech therapy upon discharge.   Verbal and written Discharge instructions given to the patient. Wound care per Discharge AVS Follow-up Information    Dew, Erskine Squibb, MD Follow up in 1 month(s).   Specialties:  Vascular Surgery, Radiology, Interventional Cardiology Why:  As per Maudie Mercury, patient will need Carotid Duplex with appointment Contact information: [place] [address] [phone]          Signed: Sela Hua, PA-C  06/06/2018, 11:20 AM

## 2018-06-06 NOTE — Discharge Instructions (Signed)
You may shower as of tomorrow.  Please keep your bilateral groin incisions clean and dry. Please do not drive until your first follow-up visit in our office. No heavy lifting greater than 10 pounds or engaging in any strenuous activities until your first follow-up visit Do not drink any alcohol with the pain medication you were prescribed tramadol.  Patient is to continue his speech therapy upon discharge.

## 2018-06-06 NOTE — Progress Notes (Signed)
[facility]  Daily Progress Note   Subjective  - 1 Day Post-Op  Doing well.  Just ate breakfast No events overnight Actually more talkative and interactive today than during any office visits preop  Objective Vitals:   06/06/18 0600 06/06/18 0800 06/06/18 0830 06/06/18 0900  BP: (!) 93/53  (!) 104/59   Pulse: 75 72 77 74  Resp: (!) 23 (!) 23 17 (!) 22  Temp:   97.7 F (36.5 C)   TempSrc:   Oral   SpO2: 100% 99% 100% 98%  Weight:      Height:        Intake/Output Summary (Last 24 hours) at 06/06/2018 0950 Last data filed at 06/06/2018 0830 Gross per 24 hour  Intake 2123.33 ml  Output 1250 ml  Net 873.33 ml    PULM  CTAB CV  RRR VASC  Access site C/D/I.  Neuro exam stable without new deficits.  Laboratory CBC    Component Value Date/Time   WBC 7.0 06/06/2018 0545   HGB 11.4 (L) 06/06/2018 0545   HGB 15.5 03/23/2014 1612   HCT 34.1 (L) 06/06/2018 0545   HCT 47.3 03/23/2014 1612   PLT 279 06/06/2018 0545   PLT 261 03/23/2014 1612    BMET    Component Value Date/Time   NA 140 06/06/2018 0545   NA 138 03/23/2014 1612   K 4.0 06/06/2018 0545   K 4.3 03/23/2014 1612   K 5.1 05/22/2013   CL 109 06/06/2018 0545   CL 105 03/23/2014 1612   CO2 26 06/06/2018 0545   CO2 29 03/23/2014 1612   GLUCOSE 115 (H) 06/06/2018 0545   GLUCOSE 128 (H) 03/23/2014 1612   BUN 12 06/06/2018 0545   BUN 22 (H) 03/23/2014 1612   CREATININE 1.12 06/06/2018 0545   CREATININE 1.30 03/23/2014 1612   CREATININE 1.14 05/22/2013   CALCIUM 8.4 (L) 06/06/2018 0545   CALCIUM 9.2 03/23/2014 1612   GFRNONAA 57 (L) 06/06/2018 0545   GFRNONAA 50 (L) 03/23/2014 1612   GFRAA >60 06/06/2018 0545   GFRAA 58 (L) 03/23/2014 1612    Assessment/Planning: POD #1 s/p right carotid stent   Did well overnight.  Plan discharge home later today.  Follow-up in office in 3 to 4 weeks with carotid duplex    Leotis Pain  06/06/2018, 9:50 AM

## 2018-06-07 ENCOUNTER — Encounter: Payer: Self-pay | Admitting: Family Medicine

## 2018-06-09 ENCOUNTER — Encounter: Payer: Self-pay | Admitting: Vascular Surgery

## 2018-06-10 ENCOUNTER — Telehealth: Payer: Self-pay | Admitting: Family Medicine

## 2018-06-10 DIAGNOSIS — I639 Cerebral infarction, unspecified: Secondary | ICD-10-CM

## 2018-06-10 DIAGNOSIS — R1310 Dysphagia, unspecified: Secondary | ICD-10-CM

## 2018-06-10 NOTE — Telephone Encounter (Signed)
Copied from [place] [id number]. Topic: Quick Communication - See Telephone Encounter >> Jun 10, 2018 10:14 AM Synthia Innocent wrote: CRM for notification. See Telephone encounter for: 06/10/18.[facility] is requesting order for Speech Therapy, eval and treat. Please advise Fax # [phone]

## 2018-06-10 NOTE — Telephone Encounter (Signed)
Agree with this. Thanks.  

## 2018-06-11 NOTE — Telephone Encounter (Signed)
Left message on vm for Tiffany of [place] her Dr. Darnell Level agrees to speech therapy for the pt.

## 2018-06-12 NOTE — Telephone Encounter (Signed)
[facility] and spoke with Jonelle Sidle and let her know we were faxing over the Speech Therapy order today.

## 2018-06-12 NOTE — Telephone Encounter (Signed)
Spoke with Ebony Hail of [facility] about order for speech therapy eval and tx.  She request an order be signed and faxed to [phone].

## 2018-06-12 NOTE — Telephone Encounter (Signed)
I have placed an order in the chart.

## 2018-06-17 DIAGNOSIS — I69320 Aphasia following cerebral infarction: Secondary | ICD-10-CM | POA: Diagnosis not present

## 2018-06-21 DIAGNOSIS — I69320 Aphasia following cerebral infarction: Secondary | ICD-10-CM | POA: Diagnosis not present

## 2018-06-24 DIAGNOSIS — I69320 Aphasia following cerebral infarction: Secondary | ICD-10-CM | POA: Diagnosis not present

## 2018-06-26 ENCOUNTER — Encounter: Payer: Self-pay | Admitting: Family Medicine

## 2018-06-26 ENCOUNTER — Ambulatory Visit (INDEPENDENT_AMBULATORY_CARE_PROVIDER_SITE_OTHER): Payer: Medicare Other | Admitting: Vascular Surgery

## 2018-06-26 ENCOUNTER — Ambulatory Visit (INDEPENDENT_AMBULATORY_CARE_PROVIDER_SITE_OTHER): Payer: Medicare Other | Admitting: Family Medicine

## 2018-06-26 ENCOUNTER — Encounter (INDEPENDENT_AMBULATORY_CARE_PROVIDER_SITE_OTHER): Payer: Medicare Other

## 2018-06-26 VITALS — BP 118/62 | HR 86 | Temp 97.6°F | Ht 67.0 in | Wt 190.2 lb

## 2018-06-26 DIAGNOSIS — R011 Cardiac murmur, unspecified: Secondary | ICD-10-CM

## 2018-06-26 DIAGNOSIS — G4709 Other insomnia: Secondary | ICD-10-CM

## 2018-06-26 DIAGNOSIS — F0150 Vascular dementia without behavioral disturbance: Secondary | ICD-10-CM

## 2018-06-26 DIAGNOSIS — E1122 Type 2 diabetes mellitus with diabetic chronic kidney disease: Secondary | ICD-10-CM

## 2018-06-26 DIAGNOSIS — G4731 Primary central sleep apnea: Secondary | ICD-10-CM

## 2018-06-26 DIAGNOSIS — R109 Unspecified abdominal pain: Secondary | ICD-10-CM | POA: Diagnosis not present

## 2018-06-26 DIAGNOSIS — R4701 Aphasia: Secondary | ICD-10-CM

## 2018-06-26 DIAGNOSIS — I639 Cerebral infarction, unspecified: Secondary | ICD-10-CM | POA: Diagnosis not present

## 2018-06-26 DIAGNOSIS — N181 Chronic kidney disease, stage 1: Secondary | ICD-10-CM | POA: Diagnosis not present

## 2018-06-26 DIAGNOSIS — I63239 Cerebral infarction due to unspecified occlusion or stenosis of unspecified carotid arteries: Secondary | ICD-10-CM | POA: Diagnosis not present

## 2018-06-26 LAB — POCT GLYCOSYLATED HEMOGLOBIN (HGB A1C): HEMOGLOBIN A1C: 6.2 % — AB (ref 4.0–5.6)

## 2018-06-26 MED ORDER — CLONAZEPAM 1 MG PO TABS: 1.0000 mg | ORAL_TABLET | Freq: Every day | ORAL | 3 refills | Status: DC

## 2018-06-26 MED ORDER — PANTOPRAZOLE SODIUM 40 MG PO TBEC: 40.0000 mg | DELAYED_RELEASE_TABLET | Freq: Two times a day (BID) | ORAL | 1 refills | Status: DC

## 2018-06-26 NOTE — Assessment & Plan Note (Addendum)
Chronic, stable period. No progression noted (no improvement noted either).

## 2018-06-26 NOTE — Assessment & Plan Note (Addendum)
Benign exam. Only started complaining of this this morning. Describes increased gassiness/bloating. Suggested Gas-x, provided with list of gas producing foods to avoid. Discussed importance of good water intake when using soluble fiber supplement (metamucil) to prevent abd cramping. Continue PPI BID for now.

## 2018-06-26 NOTE — Assessment & Plan Note (Signed)
Update A1c today - great control noted. Continue low dose metformin bid.

## 2018-06-26 NOTE — Assessment & Plan Note (Addendum)
Chronic, longstanding. Now off lunesta, klonopin started by neurology/sleep medicine with initial benefit, now effect may be wearing off. Ok to try 1mg  tablet at night, but we did review risks of thsi controlled substance to include tolerance - sent higher dose to pharmacy. If need for ongoing titration, will likely recommend taper off med.

## 2018-06-26 NOTE — Progress Notes (Signed)
BP 118/62 (BP Location: Left Arm, Patient Position: Sitting, Cuff Size: Normal)   Pulse 86   Temp 97.6 F (36.4 C) (Oral)   Ht 5\' 7"  (1.702 m)   Wt 190 lb 4 oz (86.3 kg)   SpO2 97%   BMI 29.80 kg/m    CC: 6 mo f/u visit Subjective:    Patient ID: Stephen [name], male    DOB: [birth year], 84 y.o.   MRN: [id number]  HPI: Stephen [name] is a 84 y.o. male presenting on 06/26/2018 for 6 mo follow up (Wants to discuss increasing lorazepam. Pt accompanied by wife.)   Recent request for ST at [facility] due to persistent aphasia after stroke. Persistent trouble with memory/cognition endorsed by partner.   Recent R carotid PTA/stent by Dr Lucky Cowboy 05/2018.  Since last seen here, saw cardiology and note reviewed.   Asks about increasing klonopin dose at bedtime. This was in place of other non-benzo hypnotics for his long term insomnia. Poor sleep limits time on CPAP.   Bowels moving well, denies d/c/n/v. Continues metamucil. Notes some abd discomfort when he lays down at night. Notes increased gassiness.  Relevant past medical, surgical, family and social history reviewed and updated as indicated. Interim medical history since our last visit reviewed. Allergies and medications reviewed and updated. Outpatient Medications Prior to Visit  Medication Sig Dispense Refill  . aspirin EC 81 MG tablet Take 81 mg by mouth every morning.    . clopidogrel (PLAVIX) 75 MG tablet Take 1 tablet (75 mg total) by mouth at bedtime. 90 tablet 1  . doxazosin (CARDURA) 1 MG tablet TAKE 1 TABLET BY MOUTH EVERY DAY 30 tablet 11  . finasteride (PROSCAR) 5 MG tablet Take 5 mg by mouth at bedtime.    . memantine (NAMENDA) 5 MG tablet Take 5 mg by mouth 2 (two) times daily.    . metFORMIN (GLUCOPHAGE) 500 MG tablet TAKE 1 TABLET (500 MG TOTAL) BY MOUTH 2 (TWO) TIMES DAILY WITH A MEAL. 180 tablet 1  . Multiple Vitamin (MULTIVITAMIN WITH MINERALS) TABS tablet Take 1 tablet by mouth daily.    . naproxen sodium  (ANAPROX) 220 MG tablet Take 220 mg by mouth daily as needed (pain).    . pravastatin (PRAVACHOL) 40 MG tablet TAKE 1 TABLET (40 MG TOTAL) BY MOUTH DAILY. 90 tablet 3  . rOPINIRole (REQUIP) 0.25 MG tablet Take 1 tablet (0.25 mg total) by mouth at bedtime. 90 tablet 2  . vitamin B-12 (CYANOCOBALAMIN) 500 MCG tablet Take 500 mcg by mouth daily.    . clonazePAM (KLONOPIN) 0.5 MG tablet Take 0.5 mg by mouth at bedtime.    . pantoprazole (PROTONIX) 40 MG tablet Take 1 tablet (40 mg total) by mouth daily. (Patient taking differently: Take 40 mg by mouth 2 (two) times daily. ) 30 tablet 3  . nitroGLYCERIN (NITROSTAT) 0.4 MG SL tablet Place 1 tablet (0.4 mg total) under the tongue every 5 (five) minutes as needed for chest pain. (Patient not taking: Reported on 06/04/2018) 30 tablet 1  . traMADol (ULTRAM) 50 MG tablet Take 1 tablet (50 mg total) by mouth every 6 (six) hours as needed for moderate pain or severe pain. 20 tablet 0   No facility-administered medications prior to visit.      Per HPI unless specifically indicated in ROS section below Review of Systems     Objective:    BP 118/62 (BP Location: Left Arm, Patient Position: Sitting, Cuff Size: Normal)  Pulse 86   Temp 97.6 F (36.4 C) (Oral)   Ht 5\' 7"  (1.702 m)   Wt 190 lb 4 oz (86.3 kg)   SpO2 97%   BMI 29.80 kg/m   Wt Readings from Last 3 Encounters:  06/26/18 190 lb 4 oz (86.3 kg)  06/05/18 192 lb 10.9 oz (87.4 kg)  06/04/18 190 lb 5 oz (86.3 kg)    Physical Exam  Constitutional: He appears well-developed and well-nourished. No distress.  HENT:  Mouth/Throat: Oropharynx is clear and moist. No oropharyngeal exudate.  Neck: Carotid bruit is not present.  Cardiovascular: Normal rate, regular rhythm and normal heart sounds.  No murmur heard. Pulmonary/Chest: Effort normal and breath sounds normal. No respiratory distress. He has no wheezes. He has no rales.  Abdominal: Soft. Bowel sounds are normal. He exhibits no distension  and no mass. There is no hepatosplenomegaly. There is no tenderness. There is no rebound, no guarding, no CVA tenderness and negative Murphy's sign. No hernia.  Musculoskeletal: He exhibits no edema.  Psychiatric: He has a normal mood and affect.  Nursing note and vitals reviewed.  Results for orders placed or performed in visit on 06/26/18  POCT glycosylated hemoglobin (Hb A1C)  Result Value Ref Range   Hemoglobin A1C 6.2 (A) 4.0 - 5.6 %   HbA1c POC (<> result, manual entry)     HbA1c, POC (prediabetic range)     HbA1c, POC (controlled diabetic range)        Assessment & Plan:   Problem List Items Addressed This Visit    Vascular dementia    Chronic, stable period. No progression noted (no improvement noted either).       Relevant Medications   clonazePAM (KLONOPIN) 1 MG tablet   Systolic murmur    Not appreciated today.      Insomnia    Chronic, longstanding. Now off lunesta, klonopin started by neurology/sleep medicine with initial benefit, now effect may be wearing off. Ok to try 1mg  tablet at night, but we did review risks of thsi controlled substance to include tolerance - sent higher dose to pharmacy. If need for ongoing titration, will likely recommend taper off med.       Diabetes type 2, controlled ([place]) - Primary    Update A1c today - great control noted. Continue low dose metformin bid.       Relevant Orders   POCT glycosylated hemoglobin (Hb A1C) (Completed)   CVA (cerebral vascular accident) ([place])   Complex sleep apnea syndrome   Carotid stenosis, symptomatic, with infarction [facility])    Has done well after recent R carotid stent placement. This was done due to failing medical management (recurrent TIAs). Continues aspirin and plavix. Also on protonix 40mg  bid while on blood thinners.       Aphasia due to acute stroke [facility])    Continues working with speech therapy.       Abdominal discomfort    Benign exam. Only started complaining of this this morning.  Describes increased gassiness/bloating. Suggested Gas-x, provided with list of gas producing foods to avoid. Discussed importance of good water intake when using soluble fiber supplement (metamucil) to prevent abd cramping. Continue PPI BID for now.           Meds ordered this encounter  Medications  . clonazePAM (KLONOPIN) 1 MG tablet    Sig: Take 1 tablet (1 mg total) by mouth at bedtime.    Dispense:  30 tablet    Refill:  3  .  pantoprazole (PROTONIX) 40 MG tablet    Sig: Take 1 tablet (40 mg total) by mouth 2 (two) times daily.    Dispense:  180 tablet    Refill:  1   Orders Placed This Encounter  Procedures  . POCT glycosylated hemoglobin (Hb A1C)    Follow up plan: No follow-ups on file.  Ria Bush, MD

## 2018-06-26 NOTE — Assessment & Plan Note (Signed)
Continues working with speech therapy.

## 2018-06-26 NOTE — Assessment & Plan Note (Signed)
Not appreciated today.  

## 2018-06-26 NOTE — Patient Instructions (Addendum)
Ok to try higher klonopin dose - 1 mg at bedtime.  Keep follow up with neurology.  Continue protonix twice daily. Trial gas X as needed Exclude gas producing foods (beans, onions, celery, carrots, raisins, bananas, apricots, prunes, brussel sprouts, wheat germ, pretzels) I recommend no driving.

## 2018-06-26 NOTE — Assessment & Plan Note (Addendum)
Has done well after recent R carotid stent placement. This was done due to failing medical management (recurrent TIAs). Continues aspirin and plavix. Also on protonix 40mg  bid while on blood thinners.

## 2018-06-27 DIAGNOSIS — I69320 Aphasia following cerebral infarction: Secondary | ICD-10-CM | POA: Diagnosis not present

## 2018-06-30 ENCOUNTER — Telehealth: Payer: Self-pay

## 2018-06-30 ENCOUNTER — Telehealth: Payer: Self-pay | Admitting: Family Medicine

## 2018-06-30 NOTE — Telephone Encounter (Signed)
Copied from [place] [phone]. Topic: Quick Communication - See Telephone Encounter >> Jun 30, 2018  9:47 AM Marja Kays F wrote: Pt wife is calling to see about getting a prior authorization for patient protonoix that was already approved on 06/26/18 and sent to the pharmacy -there are no notes in chart  From pharmacy requesting the authoriZATION   BEST NUMBER [id number]

## 2018-06-30 NOTE — Telephone Encounter (Signed)
See telephone note from 06/30/18.

## 2018-06-30 NOTE — Telephone Encounter (Signed)
See previous TE for today.

## 2018-06-30 NOTE — Telephone Encounter (Signed)
[  06/30/2018 9:40 AM]  BURTON, DONNA:   are you available to help me with a patient Stephen [name]    [06/30/2018 9:41 AM]  Ozzie Hoyle:   yes ma'am what do you need?   [06/30/2018 9:42 AM]  Kalman Shan, DONNA:   wife is on the phone stating that they need a authorization for his protonix which was sent to the pharmacy on 06/26/18 but i see nothing in the chart about this only what wife is saying and she states this needs to be done today because he is out and wants to speak with office manager   [06/30/2018 9:45 AM]  Ozzie Hoyle:   is pts wife on phone now?   [06/30/2018 9:45 AM]  BURTON, DONNA:   yes   [06/30/2018 9:45 AM]  Ozzie Hoyle:   ok give me a call per med list was sent to pharmacy on 06/26/18 and please send me a note.  I spoke with Ms Beranek and she said she had spoken with pharmacist at [facility] this morning and the prior auth was in Dr Gutierrez's hands. I could not find PA in pts chart and explained that to Ms [facility] and that I would call the [place] now to see what I could find out; pt said she could not hold and to call her back this morning [phone] and what can be done because pt is out of pantoprazole. I advised that either I or [name] CMA would call pt back this morning. I spoke with Mickel Baas pharmacist [place] and she said she faxed PA on 06/26/18 and again today. I got PA off printer and have taken to [facility].

## 2018-06-30 NOTE — Telephone Encounter (Signed)
PA approved.  Notified [facility] of approval.  Says it went through and they will prepare rx for pt.  Spoke with pt notifying him the PA was approved and that the pharmacy is getting rx ready.  Verbalizes understanding and expresses his thanks.

## 2018-06-30 NOTE — Telephone Encounter (Addendum)
Spoke with the [facility].  Told they are still waiting for PA to be done. Told them I will submit it and inform them of the decision. Verbalizes understanding.  Started PA for pantoprazole sod dr 40 mg tab, key: [id number], PA case ID:  [id number].  Decision pending.

## 2018-07-01 DIAGNOSIS — Z08 Encounter for follow-up examination after completed treatment for malignant neoplasm: Secondary | ICD-10-CM | POA: Diagnosis not present

## 2018-07-01 DIAGNOSIS — Z85828 Personal history of other malignant neoplasm of skin: Secondary | ICD-10-CM | POA: Diagnosis not present

## 2018-07-01 DIAGNOSIS — L821 Other seborrheic keratosis: Secondary | ICD-10-CM | POA: Diagnosis not present

## 2018-07-01 DIAGNOSIS — X32XXXA Exposure to sunlight, initial encounter: Secondary | ICD-10-CM | POA: Diagnosis not present

## 2018-07-01 DIAGNOSIS — L570 Actinic keratosis: Secondary | ICD-10-CM | POA: Diagnosis not present

## 2018-07-01 DIAGNOSIS — I69320 Aphasia following cerebral infarction: Secondary | ICD-10-CM | POA: Diagnosis not present

## 2018-07-04 DIAGNOSIS — I69320 Aphasia following cerebral infarction: Secondary | ICD-10-CM | POA: Diagnosis not present

## 2018-07-08 DIAGNOSIS — I69320 Aphasia following cerebral infarction: Secondary | ICD-10-CM | POA: Diagnosis not present

## 2018-07-12 ENCOUNTER — Other Ambulatory Visit: Payer: Self-pay | Admitting: Family Medicine

## 2018-07-15 DIAGNOSIS — F0150 Vascular dementia without behavioral disturbance: Secondary | ICD-10-CM | POA: Diagnosis not present

## 2018-07-15 DIAGNOSIS — R2681 Unsteadiness on feet: Secondary | ICD-10-CM | POA: Diagnosis not present

## 2018-07-15 DIAGNOSIS — G309 Alzheimer's disease, unspecified: Secondary | ICD-10-CM | POA: Diagnosis not present

## 2018-07-15 DIAGNOSIS — I6523 Occlusion and stenosis of bilateral carotid arteries: Secondary | ICD-10-CM | POA: Diagnosis not present

## 2018-07-15 DIAGNOSIS — M6281 Muscle weakness (generalized): Secondary | ICD-10-CM | POA: Diagnosis not present

## 2018-07-15 DIAGNOSIS — G4733 Obstructive sleep apnea (adult) (pediatric): Secondary | ICD-10-CM | POA: Diagnosis not present

## 2018-07-15 DIAGNOSIS — M25562 Pain in left knee: Secondary | ICD-10-CM | POA: Diagnosis not present

## 2018-07-15 DIAGNOSIS — G2581 Restless legs syndrome: Secondary | ICD-10-CM | POA: Diagnosis not present

## 2018-07-15 DIAGNOSIS — F0280 Dementia in other diseases classified elsewhere without behavioral disturbance: Secondary | ICD-10-CM | POA: Diagnosis not present

## 2018-07-15 DIAGNOSIS — I63512 Cerebral infarction due to unspecified occlusion or stenosis of left middle cerebral artery: Secondary | ICD-10-CM | POA: Diagnosis not present

## 2018-07-18 DIAGNOSIS — M25562 Pain in left knee: Secondary | ICD-10-CM | POA: Diagnosis not present

## 2018-07-18 DIAGNOSIS — M6281 Muscle weakness (generalized): Secondary | ICD-10-CM | POA: Diagnosis not present

## 2018-07-18 DIAGNOSIS — R2681 Unsteadiness on feet: Secondary | ICD-10-CM | POA: Diagnosis not present

## 2018-07-21 ENCOUNTER — Encounter (INDEPENDENT_AMBULATORY_CARE_PROVIDER_SITE_OTHER): Payer: Self-pay | Admitting: Vascular Surgery

## 2018-07-21 ENCOUNTER — Ambulatory Visit (INDEPENDENT_AMBULATORY_CARE_PROVIDER_SITE_OTHER): Payer: Medicare Other

## 2018-07-21 ENCOUNTER — Ambulatory Visit (INDEPENDENT_AMBULATORY_CARE_PROVIDER_SITE_OTHER): Payer: Medicare Other | Admitting: Vascular Surgery

## 2018-07-21 ENCOUNTER — Other Ambulatory Visit (INDEPENDENT_AMBULATORY_CARE_PROVIDER_SITE_OTHER): Payer: Self-pay | Admitting: Vascular Surgery

## 2018-07-21 VITALS — BP 127/77 | HR 68 | Resp 16 | Ht 67.0 in | Wt 193.0 lb

## 2018-07-21 DIAGNOSIS — E7800 Pure hypercholesterolemia, unspecified: Secondary | ICD-10-CM

## 2018-07-21 DIAGNOSIS — I679 Cerebrovascular disease, unspecified: Secondary | ICD-10-CM

## 2018-07-21 DIAGNOSIS — I63239 Cerebral infarction due to unspecified occlusion or stenosis of unspecified carotid arteries: Secondary | ICD-10-CM

## 2018-07-21 DIAGNOSIS — E1122 Type 2 diabetes mellitus with diabetic chronic kidney disease: Secondary | ICD-10-CM | POA: Diagnosis not present

## 2018-07-21 DIAGNOSIS — N181 Chronic kidney disease, stage 1: Secondary | ICD-10-CM | POA: Diagnosis not present

## 2018-07-21 NOTE — Progress Notes (Signed)
Subjective:    Patient ID: Stephen [name], male    DOB: 1926/12/21, 84 y.o.   MRN: [id number] Chief Complaint  Patient presents with  . Carotid    [facility] 1 month follow up   Patient presents for his first post procedure follow-up.  The patient is status post a right carotid artery stent on June 05, 2018.  The patient's post procedure course has been unremarkable.  The patient presents today without complaint.  The patient's bilateral groins have healed well. The patient denies experiencing Amaurosis Fugax, TIA like symptoms or focal motor deficits. Duplex is notable for a patent right stent and known occluded Left ICA.  Bilateral vertebral arteries are antegrade.  Bilateral subclavian arteries are multiphasic and within normal limits.  Patient denies any fever, nausea vomiting.  Review of Systems  Constitutional: Negative.   HENT: Negative.   Eyes: Negative.   Respiratory: Negative.   Cardiovascular: Negative.   Gastrointestinal: Negative.   Endocrine: Negative.   Genitourinary: Negative.   Musculoskeletal: Negative.   Skin: Negative.   Allergic/Immunologic: Negative.   Neurological: Negative.   Hematological: Negative.   Psychiatric/Behavioral: Negative.       Objective:   Physical Exam  Constitutional: He is oriented to person, place, and time. He appears well-developed and well-nourished. No distress.  HENT:  Head: Normocephalic and atraumatic.  Right Ear: External ear normal.  Left Ear: External ear normal.  Eyes: Pupils are equal, round, and reactive to light. Conjunctivae are normal.  Neck: Normal range of motion.  No carotid bruits noted on exam  Cardiovascular: Normal rate, regular rhythm, normal heart sounds and intact distal pulses.  Pulses:      Radial pulses are 2+ on the right side, and 2+ on the left side.  Pulmonary/Chest: Effort normal and breath sounds normal.  Musculoskeletal: Normal range of motion. He exhibits no edema.  Neurological: He is alert and  oriented to person, place, and time.  Skin: Skin is warm and dry. He is not diaphoretic.  Psychiatric: He has a normal mood and affect. His behavior is normal. Judgment and thought content normal.  Vitals reviewed.  BP 127/77 (BP Location: Left Arm)   Pulse 68   Resp 16   Ht 5\' 7"  (1.702 m)   Wt 193 lb (87.5 kg)   BMI 30.23 kg/m   Past Medical History:  Diagnosis Date  . Arthritis of knee   . BPH (benign prostatic hypertrophy)    w/ nocturia  . Coronary artery disease 03/2014   Inferior ST elevation myocardial infarction. Cardiac catheterization showed an occluded mid RCA. He had an angioplasty and drug-eluting stent placement with a 3.0 x 16 mm Promus drug-eluting stent. Ejection fraction was 45% by echo, completed cardiac rehab 06/2014  . Dementia    s/p stroke  . Diabetes type 2, controlled ([place])   . Heart murmur    keeping an eye on it but not treating  . History of basal cell cancer    s/p mohs  . History of colon polyps   . History of hiatal hernia 05/2018   this causes laryngeal spasms. pt was taking nitro for this when bp drops and he ends up with tia symptoms  . History of hypertension   . HLD (hyperlipidemia)    diet controlled in past  . Hypertension   . Insomnia    treated with multiple meds in past  . MI (myocardial infarction) ([place]) 2015  . Rosacea   . Skin cancer   .  Sleep apnea    has cpap but does not use properly and does not like it!!  . Stroke ([place]) 03/2018   per cat scan, patient has had several strokes  . UTI (urinary tract infection)    Social History   Socioeconomic History  . Marital status: Widowed    Spouse name: Not on file  . Number of children: Not on file  . Years of education: Not on file  . Highest education level: Not on file  Occupational History  . Not on file  Social Needs  . Financial resource strain: Not on file  . Food insecurity:    Worry: Not on file    Inability: Not on file  . Transportation needs:    Medical: Not  on file    Non-medical: Not on file  Tobacco Use  . Smoking status: Former Smoker    Types: Cigarettes    Last attempt to quit: 11/12/1962    Years since quitting: 55.7  . Smokeless tobacco: Never Used  . Tobacco comment: Quit 1964  Substance and Sexual Activity  . Alcohol use: Yes    Alcohol/week: 1.0 standard drinks    Types: 1 Cans of beer per week    Comment: rarely  . Drug use: No  . Sexual activity: Never  Lifestyle  . Physical activity:    Days per week: Not on file    Minutes per session: Not on file  . Stress: Not on file  Relationships  . Social connections:    Talks on phone: Not on file    Gets together: Not on file    Attends religious service: Not on file    Active member of club or organization: Not on file    Attends meetings of clubs or organizations: Not on file    Relationship status: Not on file  . Intimate partner violence:    Fear of current or ex partner: Not on file    Emotionally abused: Not on file    Physically abused: Not on file    Forced sexual activity: Not on file  Other Topics Concern  . Not on file  Social History Narrative   Lives at [facility] with friend - Osvaldo Angst RN.   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 Vegetarian.   Past Surgical History:  Procedure Laterality Date  . CARDIAC CATHETERIZATION  03/2014   [facility];x1 stent  . CAROTID PTA/STENT INTERVENTION Right 05/2018   [facility]  . CAROTID PTA/STENT INTERVENTION Right 06/05/2018   Procedure: CAROTID PTA/STENT INTERVENTION;  Surgeon: Algernon Huxley, MD;  Location: [place] [facility];  Service: Cardiovascular;  Laterality: Right;  . CATARACT EXTRACTION W/PHACO Left 10/09/2016   Procedure: CATARACT EXTRACTION PHACO AND INTRAOCULAR LENS PLACEMENT (IOC);  Surgeon: Birder Robson, MD;  Location: [facility] ORS;  Service:  Ophthalmology;  Laterality: Left;  [place] 1.13 AP% 18.3 CDE 13.45 Fluid pack lot # [id number]  . COLONOSCOPY  08/2010   hyperplastic polyp, rec rpt 5 yrs  . ESOPHAGOGASTRODUODENOSCOPY  05/2015   dilated stricture, normal biopsies, HH, no definite infection [name] @ [facility])  . EYE SURGERY Left    cataract extraction  . JOINT REPLACEMENT Left    total knee replacement  . MOHS SURGERY     basal cell chin/back  . REPLACEMENT TOTAL KNEE Left 08/2006  .  VASECTOMY  1970   Family History  Problem Relation Age of Onset  . Cancer Father 37       prostate and colon  . Cancer Mother        ovarian or uterine  . CAD Brother 70       MI, smoker  . Parkinson's disease Brother   . COPD Brother   . CAD Brother        MI  . Stroke Sister    Allergies  Allergen Reactions  . Aricept [Donepezil Hcl] Diarrhea    severe  . Belsomra [Suvorexant] Other (See Comments)    Anxiety. Did not work  . Vancomycin Itching, Rash and Other (See Comments)    Burning pain   . Ambien [Zolpidem Tartrate] Other (See Comments)    Gi problems  . Atorvastatin     muscle aches  . Doxycycline Rash  . Penicillins Hives    Has patient had a PCN reaction causing immediate rash, facial/tongue/throat swelling, SOB or lightheadedness with hypotension: Yes Has patient had a PCN reaction causing severe rash involving mucus membranes or skin necrosis: Yes Has patient had a PCN reaction that required hospitalization No Has patient had a PCN reaction occurring within the last 10 years: No If all of the above answers are "NO", then may proceed with Cephalosporin use.   . Tamsulosin Other (See Comments)    Leg weakness.       Assessment & Plan:  Patient presents for his first post procedure follow-up.  The patient is status post a right carotid artery stent on June 05, 2018.  The patient's post procedure course has been unremarkable.  The patient presents today without complaint.  The patient's bilateral groins have healed well.  The patient denies experiencing Amaurosis Fugax, TIA like symptoms or focal motor deficits. Duplex is notable for a patent right stent and known occluded Left ICA.  Bilateral vertebral arteries are antegrade.  Bilateral subclavian arteries are multiphasic and within normal limits.  Patient denies any fever, nausea vomiting.  1. Carotid stenosis, symptomatic, with infarction ([place]) - Stable Studies reviewed with patient. Patient asymptomatic with patent right ICA stent. Known left ICA occlusion.    No intervention at this time.  Patient to return in three months for surveillance carotid duplex. Patient to continue medical optimization with ASA and dyslipidemia medication. Patient to remain abstinent of tobacco use. I have discussed with the patient at length the risk factors for and pathogenesis of atherosclerotic disease and encouraged a healthy diet, regular exercise regimen and blood pressure / glucose control.  Patient was instructed to contact our office in the interim with problems such as arm / leg weakness or numbness, speech / swallowing difficulty or temporary monocular blindness. The patient expresses their understanding.   - VAS US CAROTID; Future  2. Controlled type 2 diabetes mellitus with stage 1 chronic kidney disease, without long-term current use of insulin (HCC) - Stable Encouraged good control as its slows the progression of atherosclerotic disease  3. Pure hypercholesterolemia - Stable Encouraged good control as its slows the progression of atherosclerotic disease  Current Outpatient Medications on File Prior to Visit  Medication Sig Dispense Refill  . aspirin EC 81 MG tablet Take 81 mg by mouth every morning.    . clonazePAM (KLONOPIN) 1 MG tablet Take 1 tablet (1 mg total) by mouth at bedtime. 30 tablet 3  . clopidogrel (PLAVIX) 75 MG tablet Take 1 tablet (75 mg total) by mouth at bedtime. [place]  tablet 1  . doxazosin (CARDURA) 1 MG tablet TAKE 1 TABLET BY MOUTH EVERY DAY  30 tablet 11  . finasteride (PROSCAR) 5 MG tablet Take 5 mg by mouth at bedtime.    . memantine (NAMENDA) 5 MG tablet Take 5 mg by mouth 2 (two) times daily.    . metFORMIN (GLUCOPHAGE) 500 MG tablet TAKE 1 TABLET (500 MG TOTAL) BY MOUTH 2 (TWO) TIMES DAILY WITH A MEAL. 180 tablet 1  . Multiple Vitamin (MULTIVITAMIN WITH MINERALS) TABS tablet Take 1 tablet by mouth daily.    . naproxen sodium (ANAPROX) 220 MG tablet Take 220 mg by mouth daily as needed (pain).    . pantoprazole (PROTONIX) 40 MG tablet Take 1 tablet (40 mg total) by mouth 2 (two) times daily. 180 tablet 1  . pravastatin (PRAVACHOL) 40 MG tablet TAKE 1 TABLET (40 MG TOTAL) BY MOUTH DAILY. 90 tablet 3  . rOPINIRole (REQUIP) 0.25 MG tablet Take 1 tablet (0.25 mg total) by mouth at bedtime. 90 tablet 2  . vitamin B-12 (CYANOCOBALAMIN) 500 MCG tablet Take 500 mcg by mouth daily.     No current facility-administered medications on file prior to visit.    There are no Patient Instructions on file for this visit. No follow-ups on file.  Meliana Canner A Andriel Omalley, PA-C

## 2018-07-22 DIAGNOSIS — M6281 Muscle weakness (generalized): Secondary | ICD-10-CM | POA: Diagnosis not present

## 2018-07-22 DIAGNOSIS — M25562 Pain in left knee: Secondary | ICD-10-CM | POA: Diagnosis not present

## 2018-07-22 DIAGNOSIS — R2681 Unsteadiness on feet: Secondary | ICD-10-CM | POA: Diagnosis not present

## 2018-07-25 ENCOUNTER — Encounter: Payer: Self-pay | Admitting: Family Medicine

## 2018-07-25 ENCOUNTER — Ambulatory Visit (INDEPENDENT_AMBULATORY_CARE_PROVIDER_SITE_OTHER): Payer: Medicare Other | Admitting: Family Medicine

## 2018-07-25 VITALS — BP 120/70 | HR 75 | Temp 97.8°F | Ht 67.0 in | Wt 194.0 lb

## 2018-07-25 DIAGNOSIS — M25562 Pain in left knee: Secondary | ICD-10-CM | POA: Diagnosis not present

## 2018-07-25 DIAGNOSIS — Z23 Encounter for immunization: Secondary | ICD-10-CM

## 2018-07-25 DIAGNOSIS — G4709 Other insomnia: Secondary | ICD-10-CM | POA: Diagnosis not present

## 2018-07-25 DIAGNOSIS — F0150 Vascular dementia without behavioral disturbance: Secondary | ICD-10-CM | POA: Diagnosis not present

## 2018-07-25 DIAGNOSIS — G4761 Periodic limb movement disorder: Secondary | ICD-10-CM

## 2018-07-25 DIAGNOSIS — R2681 Unsteadiness on feet: Secondary | ICD-10-CM | POA: Diagnosis not present

## 2018-07-25 DIAGNOSIS — I639 Cerebral infarction, unspecified: Secondary | ICD-10-CM | POA: Diagnosis not present

## 2018-07-25 DIAGNOSIS — M6281 Muscle weakness (generalized): Secondary | ICD-10-CM | POA: Diagnosis not present

## 2018-07-25 MED ORDER — ACETAMINOPHEN 500 MG PO TABS: 500.0000 mg | ORAL_TABLET | Freq: Two times a day (BID) | ORAL | 0 refills | Status: DC

## 2018-07-25 NOTE — Assessment & Plan Note (Signed)
requip recently stopped, pt doing well off medication.

## 2018-07-25 NOTE — Progress Notes (Signed)
BP 120/70 (BP Location: Left Arm, Patient Position: Sitting, Cuff Size: Normal)   Pulse 75   Temp 97.8 F (36.6 C) (Oral)   Ht 5\' 7"  (1.702 m)   Wt 194 lb (88 kg)   SpO2 97%   BMI 30.38 kg/m    CC: L knee pain Subjective:    Patient ID: Stephen [name], male    DOB: 09-26-1927, 84 y.o.   MRN: [id number]  HPI: Stephen [name] is a 84 y.o. male presenting on 07/25/2018 for Knee Pain (C/o left knee pain. Has implant in left knee and pain is worsening. PT noticed pt is limping a little and suggested pt see ortho. Pt taking Aleve. Pt accompanied by his wife. )   Saw Dr Manuella Ghazi who had him walk - noticed gait change recommended PT for balance and fall prevention. PT through [facility]. Some limping endorsed. This has been going on for months, acutely worse last few weeks. Less likely to walk due to discomfort. No knee redness, swelling, warmth.  Taking aleve 1 BID.  H/o L knee replacement 2007, thinks at [place] was recently stopped.  Klonopin whole tablet was overly sedating - now down to 1/2 tab at night time with benefit.   Relevant past medical, surgical, family and social history reviewed and updated as indicated. Interim medical history since our last visit reviewed. Allergies and medications reviewed and updated. Outpatient Medications Prior to Visit  Medication Sig Dispense Refill  . aspirin EC 81 MG tablet Take 81 mg by mouth every morning.    . clonazePAM (KLONOPIN) 1 MG tablet Take 1 tablet (1 mg total) by mouth at bedtime. (Patient taking differently: Take 1 mg by mouth at bedtime. Taking 1/5 tablet) 30 tablet 3  . clopidogrel (PLAVIX) 75 MG tablet Take 1 tablet (75 mg total) by mouth at bedtime. 90 tablet 1  . doxazosin (CARDURA) 1 MG tablet TAKE 1 TABLET BY MOUTH EVERY DAY 30 tablet 11  . finasteride (PROSCAR) 5 MG tablet Take 5 mg by mouth at bedtime.    . memantine (NAMENDA) 5 MG tablet Take 5 mg by mouth 2 (two) times daily.    . metFORMIN (GLUCOPHAGE) 500 MG  tablet TAKE 1 TABLET (500 MG TOTAL) BY MOUTH 2 (TWO) TIMES DAILY WITH A MEAL. 180 tablet 1  . Multiple Vitamin (MULTIVITAMIN WITH MINERALS) TABS tablet Take 1 tablet by mouth daily.    . pantoprazole (PROTONIX) 40 MG tablet Take 1 tablet (40 mg total) by mouth 2 (two) times daily. 180 tablet 1  . pravastatin (PRAVACHOL) 40 MG tablet TAKE 1 TABLET (40 MG TOTAL) BY MOUTH DAILY. 90 tablet 3  . vitamin B-12 (CYANOCOBALAMIN) 500 MCG tablet Take 500 mcg by mouth daily.    . naproxen sodium (ANAPROX) 220 MG tablet Take 220 mg by mouth daily as needed (pain).    Marland Kitchen rOPINIRole (REQUIP) 0.25 MG tablet Take 1 tablet (0.25 mg total) by mouth at bedtime. (Patient not taking: Reported on 07/25/2018) 90 tablet 2   No facility-administered medications prior to visit.      Per HPI unless specifically indicated in ROS section below Review of Systems     Objective:    BP 120/70 (BP Location: Left Arm, Patient Position: Sitting, Cuff Size: Normal)   Pulse 75   Temp 97.8 F (36.6 C) (Oral)   Ht 5\' 7"  (1.702 m)   Wt 194 lb (88 kg)   SpO2 97%   BMI 30.38 kg/m  Wt Readings from Last 3 Encounters:  07/25/18 194 lb (88 kg)  07/21/18 193 lb (87.5 kg)  06/26/18 190 lb 4 oz (86.3 kg)    Physical Exam  Constitutional: He appears well-developed and well-nourished. No distress.  Musculoskeletal: Normal range of motion. He exhibits no edema.  R knee WNL L Knee exam: No deformity on inspection. No significant pain with palpation of knee landmarks. No effusion/swelling noted. FROM in flex/extension without crepitus. No popliteal fullness. Neg drawer test. No pain with valgus/varus stress. No abnormal patellar mobility.   Nursing note and vitals reviewed.     Assessment & Plan:   Problem List Items Addressed This Visit    Vascular dementia   PLMD (periodic limb movement disorder)    requip recently stopped, pt doing well off medication.       Left knee pain - Primary    Of several weeks duration,  limp noted by PT. In h/o L knee replacement 2007. Overall benign exam. Will refer to ortho for further evaluation and recs. rec stop aleve (he's taking aspirin and plavix) and use tylenol for pain in its place, start at 500mg  bid.      Relevant Orders   Ambulatory referral to Orthopedic Surgery   Insomnia    Back to 1/2 dose klonopin (whole tablet overly sedating).        Other Visit Diagnoses    Need for influenza vaccination       Relevant Orders   Flu Vaccine QUAD 36+ mos IM (Completed)       Meds ordered this encounter  Medications  . acetaminophen (TYLENOL) 500 MG tablet    Sig: Take 1 tablet (500 mg total) by mouth 2 (two) times daily.    Dispense:  30 tablet    Refill:  0   Orders Placed This Encounter  Procedures  . Flu Vaccine QUAD 36+ mos IM  . Ambulatory referral to Orthopedic Surgery    Referral Priority:   Routine    Referral Type:   Surgical    Referral Reason:   Specialty Services Required    Requested Specialty:   Orthopedic Surgery    Number of Visits Requested:   1    Follow up plan: No follow-ups on file.  Ria Bush, MD

## 2018-07-25 NOTE — Assessment & Plan Note (Addendum)
Of several weeks duration, limp noted by PT. In h/o L knee replacement 2007. Overall benign exam. Will refer to ortho for further evaluation and recs. rec stop aleve (he's taking aspirin and plavix) and use tylenol for pain in its place, start at 500mg  bid.

## 2018-07-25 NOTE — Assessment & Plan Note (Signed)
Back to 1/2 dose klonopin (whole tablet overly sedating).

## 2018-07-25 NOTE — Patient Instructions (Signed)
Flu shot today We will refer you to orthopedist for further evaluation of knee pain. Try tylenol in place of aleve.

## 2018-07-29 DIAGNOSIS — M25562 Pain in left knee: Secondary | ICD-10-CM | POA: Diagnosis not present

## 2018-07-29 DIAGNOSIS — M6281 Muscle weakness (generalized): Secondary | ICD-10-CM | POA: Diagnosis not present

## 2018-07-29 DIAGNOSIS — R2681 Unsteadiness on feet: Secondary | ICD-10-CM | POA: Diagnosis not present

## 2018-08-01 DIAGNOSIS — R2681 Unsteadiness on feet: Secondary | ICD-10-CM | POA: Diagnosis not present

## 2018-08-01 DIAGNOSIS — M25562 Pain in left knee: Secondary | ICD-10-CM | POA: Diagnosis not present

## 2018-08-01 DIAGNOSIS — M6281 Muscle weakness (generalized): Secondary | ICD-10-CM | POA: Diagnosis not present

## 2018-08-07 DIAGNOSIS — M25562 Pain in left knee: Secondary | ICD-10-CM | POA: Diagnosis not present

## 2018-08-07 DIAGNOSIS — R2681 Unsteadiness on feet: Secondary | ICD-10-CM | POA: Diagnosis not present

## 2018-08-07 DIAGNOSIS — M6281 Muscle weakness (generalized): Secondary | ICD-10-CM | POA: Diagnosis not present

## 2018-08-08 DIAGNOSIS — R2681 Unsteadiness on feet: Secondary | ICD-10-CM | POA: Diagnosis not present

## 2018-08-08 DIAGNOSIS — M6281 Muscle weakness (generalized): Secondary | ICD-10-CM | POA: Diagnosis not present

## 2018-08-08 DIAGNOSIS — M25562 Pain in left knee: Secondary | ICD-10-CM | POA: Diagnosis not present

## 2018-08-11 DIAGNOSIS — M7052 Other bursitis of knee, left knee: Secondary | ICD-10-CM | POA: Diagnosis not present

## 2018-08-11 DIAGNOSIS — M25562 Pain in left knee: Secondary | ICD-10-CM | POA: Diagnosis not present

## 2018-08-11 DIAGNOSIS — Z96652 Presence of left artificial knee joint: Secondary | ICD-10-CM | POA: Diagnosis not present

## 2018-08-12 DIAGNOSIS — M6281 Muscle weakness (generalized): Secondary | ICD-10-CM | POA: Diagnosis not present

## 2018-08-12 DIAGNOSIS — M25562 Pain in left knee: Secondary | ICD-10-CM | POA: Diagnosis not present

## 2018-08-12 DIAGNOSIS — R2681 Unsteadiness on feet: Secondary | ICD-10-CM | POA: Diagnosis not present

## 2018-08-13 NOTE — Progress Notes (Signed)
[facility]  PATIENT: Stephen [name] DOB: [birth year]   REASON FOR VISIT: New diagnosis of complex sleep apnea here for initial ASV compliance HISTORY FROM: Patient and Stephen [name] significant other    HISTORY OF PRESENT ILLNESS:UPDATE 10/3/2019CM Stephen [name], 84 year old male returns for follow-up with history of obstructive sleep apnea here for ASV compliance.  He has been doing well with his CPAP.  Compliance data dated 07/15/2018-08/13/2018 shows compliance greater than 4 hours 80%.  Average usage 6 hours 25 minutes pressure 4 to 15 cm.  AHI 0.8 ESS score 5.  Since last seen he had a CVA in May and a carotid stent on June 05, 2018.  He follows up with Dr. Manuella Ghazi neurologist at [facility] .  He returns for reevaluation    UPDATE 4/19/2019CM Stephen [name], 84 year old male returns for follow-up with history of obstructive sleep apnea here for initial ASV compliance.  He has a very oily face and his mask is sometimes slippery although caregiver states he has been doing better the last week or so.  ASV data shows compliance greater than 4 hours at 60%.  Average usage 5 hours 47 minutes.  Pressure set 4-15 cm.  AHI 2.6 ESS 6.  Fatigue severity scale 30.  He returns for reevaluation 11/14/18CDMervin H [name] is a 84 y.o. male , seen here in a referral from Dr. Danise Mina for chronic Insomnia,  a condition he has suffered of since at least 20 years.   The patient had failed multiple medications treating insomnia, including Ambien which left him confused, Remeron which did not improve his sleep and actually caused terrible nightmares, melatonin was also without effect, he tried Sonata without success, he has not been placed on primary benzodiazepines. The best result was achieved by using Lunesta which his insurance now fails to cover.  He tried 2 mg with good success.  The patient has an elevated hemoglobin A1c indicative of the presence of diabetes mellitus, he has a history of  coronary artery disease, had a cardiac stent placed in May 2015, he is status post cataract extraction, has hypertension, colon polyps, history of basal cell cancer, arthritis, nocturia with benign prostate hyperplasia, and had a myocardial infarction in 2015. He has never been evaluated for insomnia, he has had a sleep study at [facility] while living in [place], [place] . He was told that no apnea was found.    Sleep habits are as follows: His usual bedtime is around 10 PM, after watching TV and the patient usually does not fall asleep after dinner. He reports that his bedroom is cool, quiet and dark in conducive to sleep.  He usually sleeps on his side, he has one pillow for head support. He does have frequent bathroom breaks, the first one around 2 AM, followed by 3.30 and 5 AM.  His other fragmentation of sleep that he cannot identify.  He seems to arouse spontaneously without discomfort, chest pain, shortness of breath, nausea.  He reports being frustrated not being able to sleep well and sustained. He spontaneously wakes up around 6:30 AM does not need an alarm, and starts his day at this time.    REVIEW OF SYSTEMS: Full 14 system review of systems performed and notable only for those listed, all others are neg:  Constitutional: neg  Cardiovascular: neg Ear/Nose/Throat: Hearing loss Skin: neg Eyes: neg Respiratory: neg Gastroitestinal: neg  Hematology/Lymphatic: neg  Endocrine: Intolerance to cold Musculoskeletal: Joint pain occasional difficulty walking Allergy/Immunology: neg Neurological: neg  Psychiatric: neg Sleep : Complex sleep apnea with ASV   ALLERGIES: Allergies  Allergen Reactions  . Aricept [Donepezil Hcl] Diarrhea    severe  . Belsomra [Suvorexant] Other (See Comments)    Anxiety. Did not work  . Vancomycin Itching, Rash and Other (See Comments)    Burning pain   . Ambien [Zolpidem Tartrate] Other (See Comments)    Gi problems  . Atorvastatin      muscle aches  . Doxycycline Rash  . Penicillins Hives    Has patient had a PCN reaction causing immediate rash, facial/tongue/throat swelling, SOB or lightheadedness with hypotension: Yes Has patient had a PCN reaction causing severe rash involving mucus membranes or skin necrosis: Yes Has patient had a PCN reaction that required hospitalization No Has patient had a PCN reaction occurring within the last 10 years: No If all of the above answers are "NO", then may proceed with Cephalosporin use.   . Tamsulosin Other (See Comments)    Leg weakness.     HOME MEDICATIONS: Outpatient Medications Prior to Visit  Medication Sig Dispense Refill  . acetaminophen (TYLENOL) 500 MG tablet Take 1 tablet (500 mg total) by mouth 2 (two) times daily. 30 tablet 0  . aspirin EC 81 MG tablet Take 81 mg by mouth every morning.    . clonazePAM (KLONOPIN) 1 MG tablet Take 1 tablet (1 mg total) by mouth at bedtime. (Patient taking differently: Take 0.5-1 mg by mouth at bedtime. Taking 1/5 tablet) 30 tablet 3  . clopidogrel (PLAVIX) 75 MG tablet Take 1 tablet (75 mg total) by mouth at bedtime. 90 tablet 1  . doxazosin (CARDURA) 1 MG tablet TAKE 1 TABLET BY MOUTH EVERY DAY 30 tablet 11  . finasteride (PROSCAR) 5 MG tablet Take 5 mg by mouth at bedtime.    . memantine (NAMENDA) 5 MG tablet Take 5 mg by mouth 2 (two) times daily.    . metFORMIN (GLUCOPHAGE) 500 MG tablet TAKE 1 TABLET (500 MG TOTAL) BY MOUTH 2 (TWO) TIMES DAILY WITH A MEAL. 180 tablet 1  . Multiple Vitamin (MULTIVITAMIN WITH MINERALS) TABS tablet Take 1 tablet by mouth daily.    . pantoprazole (PROTONIX) 40 MG tablet Take 1 tablet (40 mg total) by mouth 2 (two) times daily. 180 tablet 1  . pravastatin (PRAVACHOL) 40 MG tablet TAKE 1 TABLET (40 MG TOTAL) BY MOUTH DAILY. 90 tablet 3  . vitamin B-12 (CYANOCOBALAMIN) 500 MCG tablet Take 500 mcg by mouth daily.     No facility-administered medications prior to visit.     PAST MEDICAL HISTORY: Past  Medical History:  Diagnosis Date  . Arthritis of knee   . BPH (benign prostatic hypertrophy)    w/ nocturia  . Coronary artery disease 03/2014   Inferior ST elevation myocardial infarction. Cardiac catheterization showed an occluded mid RCA. He had an angioplasty and drug-eluting stent placement with a 3.0 x 16 mm Promus drug-eluting stent. Ejection fraction was 45% by echo, completed cardiac rehab 06/2014  . Dementia ([place])    s/p stroke  . Diabetes type 2, controlled ([facility])   . Heart murmur    keeping an eye on it but not treating  . History of basal cell cancer    s/p mohs  . History of colon polyps   . History of hiatal hernia 05/2018   this causes laryngeal spasms. pt was taking nitro for this when bp drops and he ends up with tia symptoms  . History of hypertension   .  HLD (hyperlipidemia)    diet controlled in past  . Hypertension   . Insomnia    treated with multiple meds in past  . MI (myocardial infarction) ([place]) 2015  . Rosacea   . Skin cancer   . Sleep apnea    has cpap but does not use properly and does not like it!!  . Stroke ([place]) 03/2018   per cat scan, patient has had several strokes  . UTI (urinary tract infection)     PAST SURGICAL HISTORY: Past Surgical History:  Procedure Laterality Date  . CARDIAC CATHETERIZATION  03/2014   [facility];x1 stent  . CAROTID PTA/STENT INTERVENTION Right 05/2018   [facility]  . CAROTID PTA/STENT INTERVENTION Right 06/05/2018   Procedure: CAROTID PTA/STENT INTERVENTION;  Surgeon: Algernon Huxley, MD;  Location: [place] [facility];  Service: Cardiovascular;  Laterality: Right;  . CATARACT EXTRACTION W/PHACO Left 10/09/2016   Procedure: CATARACT EXTRACTION PHACO AND INTRAOCULAR LENS PLACEMENT (IOC);  Surgeon: Birder Robson, MD;  Location: [facility] OR[facility];  Service: Ophthalmology;  Laterality: Left;  [place] 1.13 AP% 18.3 CDE 13.45 Fluid pack lot # [id number]  . COLONOSCOPY  08/2010   hyperplastic polyp, rec rpt 5 yrs  . ESOPHAGOGASTRODUODENOSCOPY   05/2015   dilated stricture, normal biopsies, HH, no definite infection [name] @ [facility])  . EYE SURGERY Left    cataract extraction  . MOHS SURGERY     basal cell chin/back  . REPLACEMENT TOTAL KNEE Left 08/2006  . VASECTOMY  1970    FAMILY HISTORY: Family History  Problem Relation Age of Onset  . Cancer Father 47       prostate and colon  . Cancer Mother        ovarian or uterine  . CAD Brother 33       MI, smoker  . Parkinson's disease Brother   . COPD Brother   . CAD Brother        MI  . Stroke Sister     SOCIAL HISTORY: Social History   Socioeconomic History  . Marital status: Widowed    Spouse name: Not on file  . Number of children: Not on file  . Years of education: Not on file  . Highest education level: Not on file  Occupational History  . Not on file  Social Needs  . Financial resource strain: Not on file  . Food insecurity:    Worry: Not on file    Inability: Not on file  . Transportation needs:    Medical: Not on file    Non-medical: Not on file  Tobacco Use  . Smoking status: Former Smoker    Types: Cigarettes    Last attempt to quit: 11/12/1962    Years since quitting: 55.7  . Smokeless tobacco: Never Used  . Tobacco comment: Quit 1964  Substance and Sexual Activity  . Alcohol use: Yes    Alcohol/week: 1.0 standard drinks    Types: 1 Cans of beer per week    Comment: rarely  . Drug use: No  . Sexual activity: Never  Lifestyle  . Physical activity:    Days per week: Not on file    Minutes per session: Not on file  . Stress: Not on file  Relationships  . Social connections:    Talks on phone: Not on file    Gets together: Not on file    Attends religious service: Not on file    Active member of club or organization: Not on file  Attends meetings of clubs or organizations: Not on file    Relationship status: Not on file  . Intimate partner violence:    Fear of current or ex partner: Not on file    Emotionally abused: Not on file     Physically abused: Not on file    Forced sexual activity: Not on file  Other Topics Concern  . Not on file  Social History Narrative   Lives at [facility] with friend - Osvaldo Angst RN.   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 Vegetarian.     PHYSICAL EXAM  Vitals:   08/14/18 1040  BP: 112/64  Pulse: 64  SpO2: 99%  Weight: 193 lb (87.5 kg)  Height: 5\' 7"  (1.702 m)   Body mass index is 30.23 kg/m.  Generalized: Well developed, obese male in no acute distress  Head: normocephalic and atraumatic,. Oropharynx benign mallopatti 3 Neck: Supple, circumference 17.5 Lungs clear Musculoskeletal: No deformity  Skin no rash or edema Neurological examination   Mentation: Alert oriented to time, place, history taking. Attention span and concentration appropriate.   Follows all commands speech and language fluent.   Cranial nerve II-XII: Pupils were equal round reactive to light extraocular movements were full, visual field were full on confrontational test. Facial sensation and strength were normal. hearing was intact to finger rubbing bilaterally. Uvula tongue midline. head turning and shoulder shrug were normal and symmetric.Tongue protrusion into cheek strength was normal. Motor: normal bulk and tone, full strength in the BUE, BLE,  Sensory: normal and symmetric to light touch,  Coordination: finger-nose-finger,  no dysmetria Gait and Station: Rising up from seated position without assistance, normal stance, no assistive device can heel toe and tandem without difficulty.  DIAGNOSTIC DATA (LABS, IMAGING, TESTING) - I reviewed patient records, labs, notes, testing and imaging myself where available.  Lab Results  Component Value Date   WBC 7.0 06/06/2018   HGB 11.4 (L) 06/06/2018   HCT 34.1 (L) 06/06/2018   MCV  89.0 06/06/2018   PLT 279 06/06/2018      Component Value Date/Time   NA 140 06/06/2018 0545   NA 138 03/23/2014 1612   K 4.0 06/06/2018 0545   K 4.3 03/23/2014 1612   K 5.1 05/22/2013   CL 109 06/06/2018 0545   CL 105 03/23/2014 1612   CO2 26 06/06/2018 0545   CO2 29 03/23/2014 1612   GLUCOSE 115 (H) 06/06/2018 0545   GLUCOSE 128 (H) 03/23/2014 1612   BUN 12 06/06/2018 0545   BUN 22 (H) 03/23/2014 1612   CREATININE 1.12 06/06/2018 0545   CREATININE 1.30 03/23/2014 1612   CREATININE 1.14 05/22/2013   CALCIUM 8.4 (L) 06/06/2018 0545   CALCIUM 9.2 03/23/2014 1612   PROT 7.0 03/15/2018 0853   PROT 6.5 05/04/2014 0806   PROT 7.6 03/23/2014 1612   ALBUMIN 3.7 03/15/2018 0853   ALBUMIN 4.2 05/04/2014 0806   ALBUMIN 4.1 03/23/2014 1612   AST 30 03/15/2018 0853   AST 45 (H) 03/23/2014 1612   AST 25 05/22/2013   ALT 14 (L) 03/15/2018 0853   ALT 27 03/23/2014 1612   ALKPHOS 55 03/15/2018 0853   ALKPHOS 79 03/23/2014 1612   ALKPHOS 59 05/22/2013   BILITOT 0.6 03/15/2018 0853   BILITOT 0.4 03/23/2014 1612   BILITOT 0.7 05/22/2013  GFRNONAA 57 (L) 06/06/2018 0545   GFRNONAA 50 (L) 03/23/2014 1612   GFRAA >60 06/06/2018 0545   GFRAA 58 (L) 03/23/2014 1612   Lab Results  Component Value Date   CHOL 126 03/16/2018   HDL 53 03/16/2018   LDLCALC 46 03/16/2018   TRIG 135 03/16/2018   CHOLHDL 2.4 03/16/2018   Lab Results  Component Value Date   HGBA1C 6.2 (A) 06/26/2018   Lab Results  Component Value Date   VFMBBUYZ70 964 12/12/2015   Lab Results  Component Value Date   TSH 0.747 04/27/2018      ASSESSMENT AND PLAN  84 y.o. year old male recently diagnosed with complex sleep apnea here for ASV compliance.  Data dated 07/15/2018-08/13/2018 shows compliance greater than 4 hours 80%.  Average usage 6 hours 25 minutes pressure 4 to 15 cm.  AHI 0.8 ESS score 5.    PLAN: ASV compliance 80% which is much improved Continue same settings Follow-up in 6 months for repeat  compliance then yearly Dennie Bible, [facility], [facility], [facility] [address] [place], [place] [address] [phone]

## 2018-08-14 ENCOUNTER — Encounter: Payer: Self-pay | Admitting: Nurse Practitioner

## 2018-08-14 ENCOUNTER — Encounter

## 2018-08-14 ENCOUNTER — Ambulatory Visit (INDEPENDENT_AMBULATORY_CARE_PROVIDER_SITE_OTHER): Payer: Medicare Other | Admitting: Nurse Practitioner

## 2018-08-14 VITALS — BP 112/64 | HR 64 | Ht 67.0 in | Wt 193.0 lb

## 2018-08-14 DIAGNOSIS — G4731 Primary central sleep apnea: Secondary | ICD-10-CM | POA: Diagnosis not present

## 2018-08-14 DIAGNOSIS — I639 Cerebral infarction, unspecified: Secondary | ICD-10-CM

## 2018-08-14 NOTE — Patient Instructions (Signed)
ASV compliance 80% which is much improved Continue same settings Follow-up in 6 months for repeat compliance then yearly

## 2018-08-15 ENCOUNTER — Other Ambulatory Visit: Payer: Self-pay | Admitting: Family Medicine

## 2018-08-15 DIAGNOSIS — M6281 Muscle weakness (generalized): Secondary | ICD-10-CM | POA: Diagnosis not present

## 2018-08-15 DIAGNOSIS — M25562 Pain in left knee: Secondary | ICD-10-CM | POA: Diagnosis not present

## 2018-08-15 DIAGNOSIS — R2681 Unsteadiness on feet: Secondary | ICD-10-CM | POA: Diagnosis not present

## 2018-08-18 DIAGNOSIS — E119 Type 2 diabetes mellitus without complications: Secondary | ICD-10-CM | POA: Diagnosis not present

## 2018-08-18 LAB — HM DIABETES EYE EXAM

## 2018-08-19 DIAGNOSIS — M25562 Pain in left knee: Secondary | ICD-10-CM | POA: Diagnosis not present

## 2018-08-19 DIAGNOSIS — R2681 Unsteadiness on feet: Secondary | ICD-10-CM | POA: Diagnosis not present

## 2018-08-19 DIAGNOSIS — M6281 Muscle weakness (generalized): Secondary | ICD-10-CM | POA: Diagnosis not present

## 2018-08-21 ENCOUNTER — Encounter: Payer: Self-pay | Admitting: Family Medicine

## 2018-08-22 DIAGNOSIS — M25562 Pain in left knee: Secondary | ICD-10-CM | POA: Diagnosis not present

## 2018-08-22 DIAGNOSIS — R2681 Unsteadiness on feet: Secondary | ICD-10-CM | POA: Diagnosis not present

## 2018-08-22 DIAGNOSIS — M6281 Muscle weakness (generalized): Secondary | ICD-10-CM | POA: Diagnosis not present

## 2018-08-26 DIAGNOSIS — M25562 Pain in left knee: Secondary | ICD-10-CM | POA: Diagnosis not present

## 2018-08-26 DIAGNOSIS — R2681 Unsteadiness on feet: Secondary | ICD-10-CM | POA: Diagnosis not present

## 2018-08-26 DIAGNOSIS — M6281 Muscle weakness (generalized): Secondary | ICD-10-CM | POA: Diagnosis not present

## 2018-08-29 DIAGNOSIS — M25562 Pain in left knee: Secondary | ICD-10-CM | POA: Diagnosis not present

## 2018-08-29 DIAGNOSIS — R2681 Unsteadiness on feet: Secondary | ICD-10-CM | POA: Diagnosis not present

## 2018-08-29 DIAGNOSIS — M6281 Muscle weakness (generalized): Secondary | ICD-10-CM | POA: Diagnosis not present

## 2018-09-02 DIAGNOSIS — M6281 Muscle weakness (generalized): Secondary | ICD-10-CM | POA: Diagnosis not present

## 2018-09-02 DIAGNOSIS — R2681 Unsteadiness on feet: Secondary | ICD-10-CM | POA: Diagnosis not present

## 2018-09-02 DIAGNOSIS — M25562 Pain in left knee: Secondary | ICD-10-CM | POA: Diagnosis not present

## 2018-09-05 DIAGNOSIS — M25562 Pain in left knee: Secondary | ICD-10-CM | POA: Diagnosis not present

## 2018-09-05 DIAGNOSIS — M6281 Muscle weakness (generalized): Secondary | ICD-10-CM | POA: Diagnosis not present

## 2018-09-05 DIAGNOSIS — R2681 Unsteadiness on feet: Secondary | ICD-10-CM | POA: Diagnosis not present

## 2018-09-12 ENCOUNTER — Other Ambulatory Visit: Payer: Self-pay | Admitting: Family Medicine

## 2018-09-22 DIAGNOSIS — M25562 Pain in left knee: Secondary | ICD-10-CM | POA: Diagnosis not present

## 2018-09-22 DIAGNOSIS — M1711 Unilateral primary osteoarthritis, right knee: Secondary | ICD-10-CM | POA: Diagnosis not present

## 2018-09-23 DIAGNOSIS — M25562 Pain in left knee: Secondary | ICD-10-CM | POA: Diagnosis not present

## 2018-09-23 DIAGNOSIS — M1711 Unilateral primary osteoarthritis, right knee: Secondary | ICD-10-CM | POA: Diagnosis not present

## 2018-10-20 ENCOUNTER — Ambulatory Visit (INDEPENDENT_AMBULATORY_CARE_PROVIDER_SITE_OTHER): Payer: Medicare Other

## 2018-10-20 ENCOUNTER — Encounter (INDEPENDENT_AMBULATORY_CARE_PROVIDER_SITE_OTHER): Payer: Self-pay | Admitting: Vascular Surgery

## 2018-10-20 ENCOUNTER — Ambulatory Visit (INDEPENDENT_AMBULATORY_CARE_PROVIDER_SITE_OTHER): Payer: Medicare Other | Admitting: Vascular Surgery

## 2018-10-20 VITALS — BP 114/73 | HR 74 | Resp 18 | Ht 68.0 in | Wt 193.0 lb

## 2018-10-20 DIAGNOSIS — I639 Cerebral infarction, unspecified: Secondary | ICD-10-CM | POA: Diagnosis not present

## 2018-10-20 DIAGNOSIS — N181 Chronic kidney disease, stage 1: Secondary | ICD-10-CM

## 2018-10-20 DIAGNOSIS — E1122 Type 2 diabetes mellitus with diabetic chronic kidney disease: Secondary | ICD-10-CM

## 2018-10-20 DIAGNOSIS — I63239 Cerebral infarction due to unspecified occlusion or stenosis of unspecified carotid arteries: Secondary | ICD-10-CM

## 2018-10-20 DIAGNOSIS — E7800 Pure hypercholesterolemia, unspecified: Secondary | ICD-10-CM | POA: Diagnosis not present

## 2018-10-20 DIAGNOSIS — Z87891 Personal history of nicotine dependence: Secondary | ICD-10-CM

## 2018-10-20 NOTE — Progress Notes (Signed)
Subjective:    Patient ID: Stephen [name], male    DOB: 23-Apr-1927, 84 y.o.   MRN: [id number] Chief Complaint  Patient presents with  . Carotid    3 month Carotid   Patient presents for a 58-month carotid artery stenosis follow-up.  The patient is status post a right ICA stent placed on June 05, 2018.  The patient presents today without complaint. The stenosis has been followed by surveillance duplexes. The patient underwent a bilateral carotid duplex scan which showed minimal change from the previous exam on 07-21-18. Duplex is stable a patent right ICA Dent (40-59%) and a known occluded left ICA.  Bilateral vertebral arteries demonstrate antegrade flow.  Normal flow hemodynamics were seen in the bilateral subclavian arteries.  The patient denies experiencing Amaurosis Fugax, TIA like symptoms or focal motor deficits.  Eyes any fever, nausea vomiting.  Review of Systems  Constitutional: Negative.   HENT: Negative.   Eyes: Negative.   Respiratory: Negative.   Cardiovascular: Negative.   Gastrointestinal: Negative.   Endocrine: Negative.   Genitourinary: Negative.   Musculoskeletal: Negative.   Skin: Negative.   Allergic/Immunologic: Negative.   Neurological: Negative.   Hematological: Negative.   Psychiatric/Behavioral: Negative.       Objective:   Physical Exam  Constitutional: He is oriented to person, place, and time. He appears well-developed and well-nourished. No distress.  HENT:  Head: Normocephalic and atraumatic.  Right Ear: External ear normal.  Left Ear: External ear normal.  Eyes: Pupils are equal, round, and reactive to light. Conjunctivae and EOM are normal.  Neck: Normal range of motion.  Very mild right carotid bruit noted  Cardiovascular: Normal rate, regular rhythm, normal heart sounds and intact distal pulses.  Pulses:      Radial pulses are 2+ on the right side, and 2+ on the left side.  Pulmonary/Chest: Effort normal and breath sounds normal.    Musculoskeletal: Normal range of motion. He exhibits no edema.  Neurological: He is alert and oriented to person, place, and time.  Skin: Skin is warm and dry. He is not diaphoretic.  Psychiatric: He has a normal mood and affect. His behavior is normal. Judgment and thought content normal.  Vitals reviewed.  BP 114/73 (BP Location: Right Arm, Patient Position: Sitting)   Pulse 74   Resp 18   Ht 5\' 8"  (1.727 m)   Wt 193 lb (87.5 kg)   BMI 29.35 kg/m   Past Medical History:  Diagnosis Date  . Arthritis of knee   . BPH (benign prostatic hypertrophy)    w/ nocturia  . Coronary artery disease 03/2014   Inferior ST elevation myocardial infarction. Cardiac catheterization showed an occluded mid RCA. He had an angioplasty and drug-eluting stent placement with a 3.0 x 16 mm Promus drug-eluting stent. Ejection fraction was 45% by echo, completed cardiac rehab 06/2014  . CVA (cerebral vascular accident) ([place]) 03/2018  . Dementia ([place])    s/p stroke  . Diabetes type 2, controlled ([place])   . Heart murmur    keeping an eye on it but not treating  . History of basal cell cancer    s/p mohs  . History of colon polyps   . History of hiatal hernia 05/2018   this causes laryngeal spasms. pt was taking nitro for this when bp drops and he ends up with tia symptoms  . History of hypertension   . HLD (hyperlipidemia)    diet controlled in past  . Hypertension   .  Insomnia    treated with multiple meds in past  . MI (myocardial infarction) ([place]) 2015  . Rosacea   . Skin cancer   . Sleep apnea    has cpap but does not use properly and does not like it!!  . Stroke ([place]) 03/2018   per cat scan, patient has had several strokes  . TIA (transient ischemic attack)    x 2 since CVA 03-2018  . UTI (urinary tract infection)    Social History   Socioeconomic History  . Marital status: Widowed    Spouse name: Not on file  . Number of children: Not on file  . Years of education: Not on file  .  Highest education level: Not on file  Occupational History  . Not on file  Social Needs  . Financial resource strain: Not on file  . Food insecurity:    Worry: Not on file    Inability: Not on file  . Transportation needs:    Medical: Not on file    Non-medical: Not on file  Tobacco Use  . Smoking status: Former Smoker    Types: Cigarettes    Last attempt to quit: 11/12/1962    Years since quitting: 55.9  . Smokeless tobacco: Never Used  . Tobacco comment: Quit 1964  Substance and Sexual Activity  . Alcohol use: Yes    Alcohol/week: 1.0 standard drinks    Types: 1 Cans of beer per week    Comment: rarely  . Drug use: No  . Sexual activity: Never  Lifestyle  . Physical activity:    Days per week: Not on file    Minutes per session: Not on file  . Stress: Not on file  Relationships  . Social connections:    Talks on phone: Not on file    Gets together: Not on file    Attends religious service: Not on file    Active member of club or organization: Not on file    Attends meetings of clubs or organizations: Not on file    Relationship status: Not on file  . Intimate partner violence:    Fear of current or ex partner: Not on file    Emotionally abused: Not on file    Physically abused: Not on file    Forced sexual activity: Not on file  Other Topics Concern  . Not on file  Social History Narrative   Lives at [facility] with friend - Osvaldo Angst RN.   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 Vegetarian.   Past Surgical History:  Procedure Laterality Date  . CARDIAC CATHETERIZATION  03/2014   [facility];x1 stent  . CAROTID PTA/STENT INTERVENTION Right 05/2018   [facility]  . CAROTID PTA/STENT INTERVENTION Right 06/05/2018   Procedure: CAROTID PTA/STENT INTERVENTION;  Surgeon: Algernon Huxley, MD;  Location: [place]  [facility];  Service: Cardiovascular;  Laterality: Right;  . CATARACT EXTRACTION W/PHACO Left 10/09/2016   Procedure: CATARACT EXTRACTION PHACO AND INTRAOCULAR LENS PLACEMENT (IOC);  Surgeon: Birder Robson, MD;  Location: [facility] ORS;  Service: Ophthalmology;  Laterality: Left;  [place] 1.13 AP% 18.3 CDE 13.45 Fluid pack lot # [id number]  . COLONOSCOPY  08/2010   hyperplastic polyp, rec rpt 5 yrs  . ESOPHAGOGASTRODUODENOSCOPY  05/2015   dilated stricture, normal biopsies, HH, no definite infection [name] @  [facility])  . EYE SURGERY Left    cataract extraction  . MOHS SURGERY     basal cell chin/back  . REPLACEMENT TOTAL KNEE Left 08/2006  . VASECTOMY  1970   Family History  Problem Relation Age of Onset  . Cancer Father 74       prostate and colon  . Cancer Mother        ovarian or uterine  . CAD Brother 23       MI, smoker  . Parkinson's disease Brother   . COPD Brother   . CAD Brother        MI  . Stroke Sister    Allergies  Allergen Reactions  . Aricept [Donepezil Hcl] Diarrhea    severe  . Belsomra [Suvorexant] Other (See Comments)    Anxiety. Did not work  . Vancomycin Itching, Rash and Other (See Comments)    Burning pain   . Ambien [Zolpidem Tartrate] Other (See Comments)    Gi problems  . Atorvastatin     muscle aches  . Doxycycline Rash  . Penicillins Hives    Has patient had a PCN reaction causing immediate rash, facial/tongue/throat swelling, SOB or lightheadedness with hypotension: Yes Has patient had a PCN reaction causing severe rash involving mucus membranes or skin necrosis: Yes Has patient had a PCN reaction that required hospitalization No Has patient had a PCN reaction occurring within the last 10 years: No If all of the above answers are "NO", then may proceed with Cephalosporin use.   . Tamsulosin Other (See Comments)    Leg weakness.       Assessment & Plan:  Patient presents for a 35-month carotid artery stenosis follow-up.  The patient is status post a  right ICA stent placed on June 05, 2018.  The patient presents today without complaint. The stenosis has been followed by surveillance duplexes. The patient underwent a bilateral carotid duplex scan which showed minimal change from the previous exam on 07-21-18. Duplex is stable a patent right ICA Dent (40-59%) and a known occluded left ICA.  Bilateral vertebral arteries demonstrate antegrade flow.  Normal flow hemodynamics were seen in the bilateral subclavian arteries.  The patient denies experiencing Amaurosis Fugax, TIA like symptoms or focal motor deficits.  Eyes any fever, nausea vomiting.  1. Carotid stenosis, symptomatic, with infarction ([place]) - Stable Studies reviewed with patient. Patient asymptomatic with stable duplex.  No intervention at this time.  Patient to return in six months for surveillance carotid duplex. Patient to continue medical optimization with ASA and dyslipidemia medication. Patient to remain abstinent of tobacco use. I have discussed with the patient at length the risk factors for and pathogenesis of atherosclerotic disease and encouraged a healthy diet, regular exercise regimen and blood pressure / glucose control.  Patient was instructed to contact our office in the interim with problems such as arm / leg weakness or numbness, speech / swallowing difficulty or temporary monocular blindness. The patient expresses their understanding.   - VAS US CAROTID; Future  2. Controlled type 2 diabetes mellitus with stage 1 chronic kidney disease, without long-term current use of insulin (HCC) - Stable Encouraged good control as its slows the progression of atherosclerotic disease  3. Pure hypercholesterolemia - Stable On ASA, Plavix and statin Encouraged good control as its slows the progression of atherosclerotic disease  Current Outpatient Medications on File Prior to Visit  Medication Sig Dispense Refill  . acetaminophen (TYLENOL) 500 MG tablet Take 1 tablet (500 mg  total)  by mouth 2 (two) times daily. 30 tablet 0  . aspirin EC 81 MG tablet Take 81 mg by mouth every morning.    . clonazePAM (KLONOPIN) 1 MG tablet Take 1 tablet (1 mg total) by mouth at bedtime. (Patient taking differently: Take 0.5-1 mg by mouth at bedtime. Taking 1/5 tablet) 30 tablet 3  . clopidogrel (PLAVIX) 75 MG tablet Take 1 tablet (75 mg total) by mouth at bedtime. 90 tablet 1  . diclofenac sodium (VOLTAREN) 1 % GEL APPLY 2 G TOPICALLY 3 (THREE) TIMES DAILY  1  . doxazosin (CARDURA) 1 MG tablet TAKE 1 TABLET BY MOUTH EVERY DAY 30 tablet 11  . finasteride (PROSCAR) 5 MG tablet TAKE 1 TABLET BY MOUTH EVERY DAY 90 tablet 1  . memantine (NAMENDA) 5 MG tablet Take 5 mg by mouth 2 (two) times daily.    . metFORMIN (GLUCOPHAGE) 500 MG tablet TAKE 1 TABLET (500 MG TOTAL) BY MOUTH 2 (TWO) TIMES DAILY WITH A MEAL. 180 tablet 1  . Multiple Vitamin (MULTIVITAMIN WITH MINERALS) TABS tablet Take 1 tablet by mouth daily.    . nitroGLYCERIN (NITROSTAT) 0.4 MG SL tablet PLACE 1 TABLET (0.4 MG TOTAL) UNDER THE TONGUE EVERY 5 (FIVE) MINUTES AS NEEDED FOR CHEST PAIN.    Marland Kitchen omeprazole (PRILOSEC) 20 MG capsule Take by mouth daily.  6  . pantoprazole (PROTONIX) 40 MG tablet Take 1 tablet (40 mg total) by mouth 2 (two) times daily. 180 tablet 1  . pravastatin (PRAVACHOL) 40 MG tablet TAKE 1 TABLET (40 MG TOTAL) BY MOUTH DAILY. 90 tablet 3  . vitamin B-12 (CYANOCOBALAMIN) 500 MCG tablet Take 500 mcg by mouth daily.    Marland Kitchen rOPINIRole (REQUIP) 0.25 MG tablet      No current facility-administered medications on file prior to visit.    There are no Patient Instructions on file for this visit. No follow-ups on file.  Kasyn Rolph A Britne Borelli, PA-C

## 2018-10-21 ENCOUNTER — Other Ambulatory Visit: Payer: Self-pay | Admitting: Family Medicine

## 2018-10-21 NOTE — Telephone Encounter (Signed)
Plavix Last filled:  08/03/18, #90/1 Last OV:  07/25/18, f/u Next OV:  01/09/19, CPE

## 2018-10-22 NOTE — Telephone Encounter (Signed)
Spoke with pt's significant other, [name] (on dpr) relaying Dr. Synthia Innocent message and instructions.  Verbalizes understanding and states pt is not taking omeprazole. Pt is taking Protonix BID.  Fyi to Dr. Darnell Level.

## 2018-10-22 NOTE — Telephone Encounter (Signed)
Refilled. plz clarify with patient's caregiver/partner - he should not be on omeprazole as this interacts with plavix.  He should be on protonix (pantoprazole) 40mg  (is he taking once or twice daily?). Stop omeprazole.

## 2018-10-23 NOTE — Telephone Encounter (Signed)
Noted  

## 2018-10-24 ENCOUNTER — Other Ambulatory Visit: Payer: Self-pay | Admitting: Family Medicine

## 2018-11-14 ENCOUNTER — Other Ambulatory Visit: Payer: Self-pay | Admitting: Family Medicine

## 2018-11-14 DIAGNOSIS — F0150 Vascular dementia without behavioral disturbance: Secondary | ICD-10-CM | POA: Diagnosis not present

## 2018-11-14 DIAGNOSIS — F0280 Dementia in other diseases classified elsewhere without behavioral disturbance: Secondary | ICD-10-CM | POA: Diagnosis not present

## 2018-11-14 DIAGNOSIS — G309 Alzheimer's disease, unspecified: Secondary | ICD-10-CM | POA: Diagnosis not present

## 2018-11-17 NOTE — Progress Notes (Signed)
Cardiology Office Note Date:  11/18/2018  Patient ID:  Stephen [name], DOB 02-20-1927, MRN [id number] PCP:  Ria Bush, MD  Cardiologist:  Dr. Fletcher Anon, MD    Chief Complaint: Follow-up  History of Present Illness: Stephen [name] is a 85 y.o. male with history of CAD with inferior ST elevation MI in 03/2014, stroke in 03/2018, carotid artery disease status post right sided stenting in 05/2018 followed by vascular surgery, memory decline, DM2, HTN, HLD with intolerance to Lipitor secondary to myalgias, and sleep apnea who presents for follow-up of his CAD.  He has been a vegetarian since age 69.  The patient presented to the hospital in 03/2014 with an inferior ST elevation MI.  Emergent cardiac catheterization showed an occluded mid RCA which was treated successfully with PCI/DES.  EF was 45%.  He was admitted to the hospital in 03/2018 with a stroke.  He was found to have an occluded left carotid artery with significant atherosclerosis of the right internal carotid artery estimated at at least 70% stenosis.  Echo showed normal LV systolic function with an EF of 55 to 60%, normal wall motion, grade 1 diastolic dysfunction, no significant valvular abnormalities, PASP normal.  Patient declined further inpatient evaluation.  He was admitted in 04/2018 with atypical chest pain and underwent nuclear stress test which showed a fixed inferior wall defect with normal EF.  He was last seen in the office in 05/2018 and denied any chest pain or shortness of breath with continuation of memory decline.  He was seen in the ED in 05/2018 with TIA.  He has followed up with vascular surgery and had a CT angiogram done which demonstrated a known left carotid artery occlusion as well as moderately calcific and mild to moderately tortuous right carotid system with at least 70% stenosis.  In this setting, he underwent right carotid artery stenting on 06/05/2018.  Most recent carotid artery ultrasound from 10/2018, again  demonstrated known total occlusion of the left internal carotid artery with 40 to 59% stenosis of the right internal carotid artery.  Labs: 06/2018 - A1c 6.2 05/2018 - Hgb 11.4, potassium 4.0, serum creatinine 1.12 04/2018 - TSH normal 03/2018 - TC 126, TG 135, HDL 53, LDL 46  He comes in accompanied by his wife today.  History is obtained from both the patient and his wife.  Both state he is doing well from a cardiac perspective.  He denies any chest pain, shortness of breath, palpitations, diaphoresis, nausea, vomiting, dizziness, presyncope, or syncope.  No lower extremity swelling, abdominal distention, orthopnea, PND, or early satiety.  No falls since he was last seen.  No BRBPR or melena.  Blood pressure typically runs in the low 790W to 40X systolic.  This has precluded the use of beta-blocker therapy with his CAD and prior systolic dysfunction.  He has a growth along the left nare that has been present for many years though the wife indicates this has been increasing in size as of late.  She plans on calling the patient's dermatologist for evaluation.  They report his memory decline is "plateaued."  He is followed by neurology for this.  They do not have any issues or concerns at this time.   Past Medical History:  Diagnosis Date  . Arthritis of knee   . BPH (benign prostatic hypertrophy)    w/ nocturia  . Coronary artery disease 03/2014   Inferior ST elevation myocardial infarction. Cardiac catheterization showed an occluded mid RCA. He had an  angioplasty and drug-eluting stent placement with a 3.0 x 16 mm Promus drug-eluting stent. Ejection fraction was 45% by echo, completed cardiac rehab 06/2014  . CVA (cerebral vascular accident) ([place]) 03/2018  . Dementia ([place])    s/p stroke  . Diabetes type 2, controlled ([place])   . Heart murmur    keeping an eye on it but not treating  . History of basal cell cancer    s/p mohs  . History of colon polyps   . History of hiatal hernia 05/2018   this  causes laryngeal spasms. pt was taking nitro for this when bp drops and he ends up with tia symptoms  . History of hypertension   . HLD (hyperlipidemia)    diet controlled in past  . Hypertension   . Insomnia    treated with multiple meds in past  . MI (myocardial infarction) ([place]) 2015  . Rosacea   . Skin cancer   . Sleep apnea    has cpap but does not use properly and does not like it!!  . Stroke ([place]) 03/2018   per cat scan, patient has had several strokes  . TIA (transient ischemic attack)    x 2 since CVA 03-2018  . UTI (urinary tract infection)     Past Surgical History:  Procedure Laterality Date  . CARDIAC CATHETERIZATION  03/2014   [facility];x1 stent  . CAROTID PTA/STENT INTERVENTION Right 05/2018   [facility]  . CAROTID PTA/STENT INTERVENTION Right 06/05/2018   Procedure: CAROTID PTA/STENT INTERVENTION;  Surgeon: Algernon Huxley, MD;  Location: [facility];  Service: Cardiovascular;  Laterality: Right;  . CATARACT EXTRACTION W/PHACO Left 10/09/2016   Procedure: CATARACT EXTRACTION PHACO AND INTRAOCULAR LENS PLACEMENT (IOC);  Surgeon: Birder Robson, MD;  Location: [facility];  Service: Ophthalmology;  Laterality: Left;  [place] 1.13 AP% 18.3 CDE 13.45 Fluid pack lot # [id number]  . COLONOSCOPY  08/2010   hyperplastic polyp, rec rpt 5 yrs  . ESOPHAGOGASTRODUODENOSCOPY  05/2015   dilated stricture, normal biopsies, HH, no definite infection [name] @ [facility])  . EYE SURGERY Left    cataract extraction  . MOHS SURGERY     basal cell chin/back  . REPLACEMENT TOTAL KNEE Left 08/2006  . VASECTOMY  1970    Current Meds  Medication Sig  . acetaminophen (TYLENOL) 500 MG tablet Take 1 tablet (500 mg total) by mouth 2 (two) times daily.  Marland Kitchen aspirin EC 81 MG tablet Take 81 mg by mouth every morning.  . clonazePAM (KLONOPIN) 1 MG tablet Take 1 tablet (1 mg total) by mouth at bedtime. (Patient taking differently: Take 0.5-1 mg by mouth at bedtime. Taking 1/5 tablet)  . clopidogrel (PLAVIX) 75  MG tablet TAKE 1 TABLET (75 MG TOTAL) BY MOUTH AT BEDTIME.  Marland Kitchen diclofenac sodium (VOLTAREN) 1 % GEL APPLY 2 G TOPICALLY 3 (THREE) TIMES DAILY  . doxazosin (CARDURA) 1 MG tablet TAKE 1 TABLET BY MOUTH EVERY DAY  . finasteride (PROSCAR) 5 MG tablet TAKE 1 TABLET BY MOUTH EVERY DAY  . memantine (NAMENDA) 5 MG tablet Take 5 mg by mouth 2 (two) times daily.  . metFORMIN (GLUCOPHAGE) 500 MG tablet TAKE 1 TABLET (500 MG TOTAL) BY MOUTH 2 (TWO) TIMES DAILY WITH A MEAL.  . Multiple Vitamin (MULTIVITAMIN WITH MINERALS) TABS tablet Take 1 tablet by mouth daily.  . pantoprazole (PROTONIX) 40 MG tablet Take 1 tablet (40 mg total) by mouth 2 (two) times daily.  . pravastatin (PRAVACHOL) 40 MG tablet TAKE  1 TABLET (40 MG TOTAL) BY MOUTH DAILY.  . vitamin B-12 (CYANOCOBALAMIN) 500 MCG tablet Take 500 mcg by mouth daily.    Allergies:   Aricept [donepezil hcl]; Belsomra [suvorexant]; Vancomycin; Ambien [zolpidem tartrate]; Atorvastatin; Doxycycline; Penicillins; and Tamsulosin   Social History:  The patient  reports that he quit smoking about 56 years ago. His smoking use included cigarettes. He has never used smokeless tobacco. He reports current alcohol use of about 1.0 standard drinks of alcohol per week. He reports that he does not use drugs.   Family History:  The patient's family history includes CAD in his brother; CAD (age of onset: 56) in his brother; COPD in his brother; Cancer in his mother; Cancer (age of onset: 2) in his father; Parkinson's disease in his brother; Stroke in his sister.  ROS:   Review of Systems  Constitutional: Negative for chills, diaphoresis, fever, malaise/fatigue and weight loss.  HENT: Negative for congestion.   Eyes: Negative for discharge and redness.  Respiratory: Negative for cough, hemoptysis, sputum production, shortness of breath and wheezing.   Cardiovascular: Negative for chest pain, palpitations, orthopnea, claudication, leg swelling and PND.  Gastrointestinal:  Negative for abdominal pain, blood in stool, heartburn, melena, nausea and vomiting.  Genitourinary: Negative for hematuria.  Musculoskeletal: Negative for falls and myalgias.  Skin: Negative for rash.  Neurological: Negative for dizziness, tingling, tremors, sensory change, speech change, focal weakness, loss of consciousness and weakness.  Endo/Heme/Allergies: Does not bruise/bleed easily.  Psychiatric/Behavioral: Positive for memory loss. Negative for substance abuse. The patient is not nervous/anxious.   All other systems reviewed and are negative.    PHYSICAL EXAM:  VS:  BP 122/70 (BP Location: Left Arm, Patient Position: Sitting, Cuff Size: Normal)   Pulse 77   Ht 5\' 8"  (1.727 m)   Wt 194 lb 8 oz (88.2 kg)   BMI 29.57 kg/m  BMI: Body mass index is 29.57 kg/m.  Physical Exam  Constitutional: He is oriented to person, place, and time. He appears well-developed and well-nourished.  HENT:  Head: Normocephalic and atraumatic.  Nose:    Eyes: Right eye exhibits no discharge. Left eye exhibits no discharge.  Neck: Normal range of motion. No JVD present.  Cardiovascular: Normal rate, regular rhythm, S1 normal and S2 normal. Exam reveals no distant heart sounds, no friction rub, no midsystolic click and no opening snap.  Murmur heard. Pulses:      Posterior tibial pulses are 2+ on the right side and 2+ on the left side.  2/6 systolic murmur best heard in the aortic area  Pulmonary/Chest: Effort normal and breath sounds normal. No respiratory distress. He has no decreased breath sounds. He has no wheezes. He has no rales. He exhibits no tenderness.  Abdominal: Soft. He exhibits no distension. There is no abdominal tenderness.  Musculoskeletal:        General: No edema.  Neurological: He is alert and oriented to person, place, and time.  Skin: Skin is warm and dry. No cyanosis. Nails show no clubbing.  Psychiatric: He has a normal mood and affect. His speech is normal and behavior  is normal. Judgment and thought content normal.     EKG:  Was ordered and interpreted by me today. Shows NSR, 77 bpm, rare PVC, prior inferior infarct, nonspecific ST-T changes (unchanged from prior)  Recent Labs: 03/15/2018: ALT 14 04/27/2018: TSH 0.747 06/06/2018: BUN 12; Creatinine, Ser 1.12; Hemoglobin 11.4; Platelets 279; Potassium 4.0; Sodium 140  03/16/2018: Cholesterol 126; HDL 53; LDL  Cholesterol 46; Total CHOL/HDL Ratio 2.4; Triglycerides 135; VLDL 27   CrCl cannot be calculated (Patient's most recent lab result is older than the maximum 21 days allowed.).   Wt Readings from Last 3 Encounters:  11/18/18 194 lb 8 oz (88.2 kg)  10/20/18 193 lb (87.5 kg)  08/14/18 193 lb (87.5 kg)     Other studies reviewed: Additional studies/records reviewed today include: summarized above  ASSESSMENT AND PLAN:  1. Coronary artery disease involving native coronary arteries without angina: He is doing well without any symptoms concerning for angina.  Remains on aspirin and Plavix in the setting of his stroke.  Not on beta-blocker secondary to relative hypotension.  He continues to live an active lifestyle without anginal symptoms and is only limited by right knee pain.  Continue aggressive risk factor modification and secondary prevention.  No plans for ischemic evaluation at this time.  2. Cardiac murmur: Appears unchanged when compared to office visit from 05/2018 with recent echocardiogram from 03/2018 demonstrating no aortic stenosis.  Consider repeat echocardiogram and follow-up visit.  3. History of stroke: Remains on aspirin and Plavix.  Managed by neurology.  4. Hypertension: Blood pressure well controlled today.  Typically, blood pressure is on the soft side which has precluded usage of beta-blocker.  5. Hyperlipidemia: Most recent LDL of 46 from 03/2018.  Tolerating pravastatin.  6. Carotid artery disease: Status post right ICA stenting in 05/2018, with most recent carotid artery ultrasound  demonstrating 40 to 59% right ICA stenosis with patent stent and no evidence of restenosis from 10/2018.  Asymptomatic.  Continue aggressive risk factor modification and secondary prevention as outlined above.  Followed by vascular surgery.  7. Narrow lesion: Recommend patient follow-up with dermatology.  Disposition: F/u with Dr. Fletcher Anon or an APP in 6 months, sooner if needed.  Current medicines are reviewed at length with the patient today.  The patient did not have any concerns regarding medicines.  Signed, Christell Faith, PA-C 11/18/2018 2:10 PM     [facility] [address] [place] [address] [place], [place] [address] [phone]

## 2018-11-18 ENCOUNTER — Encounter: Payer: Self-pay | Admitting: Physician Assistant

## 2018-11-18 ENCOUNTER — Ambulatory Visit (INDEPENDENT_AMBULATORY_CARE_PROVIDER_SITE_OTHER): Payer: Medicare Other | Admitting: Physician Assistant

## 2018-11-18 VITALS — BP 122/70 | HR 77 | Ht 68.0 in | Wt 194.5 lb

## 2018-11-18 DIAGNOSIS — Z8673 Personal history of transient ischemic attack (TIA), and cerebral infarction without residual deficits: Secondary | ICD-10-CM

## 2018-11-18 DIAGNOSIS — I739 Peripheral vascular disease, unspecified: Secondary | ICD-10-CM | POA: Diagnosis not present

## 2018-11-18 DIAGNOSIS — I2510 Atherosclerotic heart disease of native coronary artery without angina pectoris: Secondary | ICD-10-CM

## 2018-11-18 DIAGNOSIS — R011 Cardiac murmur, unspecified: Secondary | ICD-10-CM

## 2018-11-18 DIAGNOSIS — I10 Essential (primary) hypertension: Secondary | ICD-10-CM

## 2018-11-18 DIAGNOSIS — E785 Hyperlipidemia, unspecified: Secondary | ICD-10-CM | POA: Diagnosis not present

## 2018-11-18 DIAGNOSIS — I779 Disorder of arteries and arterioles, unspecified: Secondary | ICD-10-CM

## 2018-11-18 NOTE — Patient Instructions (Signed)
Medication Instructions:  Your physician recommends that you continue on your current medications as directed. Please refer to the Current Medication list given to you today.  If you need a refill on your cardiac medications before your next appointment, please call your pharmacy.   Lab work: None today  If you have labs (blood work) drawn today and your tests are completely normal, you will receive your results only by: Marland Kitchen MyChart Message (if you have MyChart) OR . A paper copy in the mail If you have any lab test that is abnormal or we need to change your treatment, we will call you to review the results.  Testing/Procedures: None ordered   Follow-Up: At [facility], you and your health needs are our priority.  As part of our continuing mission to provide you with exceptional heart care, we have created designated Provider Care Teams.  These Care Teams include your primary Cardiologist (physician) and Advanced Practice Providers (APPs -  Physician Assistants and Nurse Practitioners) who all work together to provide you with the care you need, when you need it. You will need a follow up appointment in 6 months.  Please call our office 2 months in advance to schedule this appointment.  You may see Kathlyn Sacramento, MD or one of the following Advanced Practice Providers on your designated Care Team:   Murray Hodgkins, NP Christell Faith, PA-C . Marrianne Mood, PA-C

## 2018-11-21 ENCOUNTER — Ambulatory Visit: Payer: Medicare Other | Admitting: Cardiovascular Disease

## 2018-11-23 ENCOUNTER — Inpatient Hospital Stay
Admission: EM | Admit: 2018-11-23 | Discharge: 2018-11-27 | DRG: 193 | Disposition: A | Discharge status: Home / Self Care | Payer: Medicare Other | Attending: Internal Medicine | Admitting: Internal Medicine

## 2018-11-23 ENCOUNTER — Other Ambulatory Visit: Payer: Self-pay

## 2018-11-23 ENCOUNTER — Emergency Department: Payer: Medicare Other

## 2018-11-23 ENCOUNTER — Encounter: Payer: Self-pay | Admitting: Emergency Medicine

## 2018-11-23 DIAGNOSIS — R05 Cough: Secondary | ICD-10-CM | POA: Diagnosis not present

## 2018-11-23 DIAGNOSIS — R4182 Altered mental status, unspecified: Secondary | ICD-10-CM | POA: Diagnosis not present

## 2018-11-23 DIAGNOSIS — Z823 Family history of stroke: Secondary | ICD-10-CM

## 2018-11-23 DIAGNOSIS — Z66 Do not resuscitate: Secondary | ICD-10-CM | POA: Diagnosis not present

## 2018-11-23 DIAGNOSIS — J8410 Pulmonary fibrosis, unspecified: Secondary | ICD-10-CM | POA: Diagnosis present

## 2018-11-23 DIAGNOSIS — R410 Disorientation, unspecified: Secondary | ICD-10-CM | POA: Diagnosis not present

## 2018-11-23 DIAGNOSIS — B957 Other staphylococcus as the cause of diseases classified elsewhere: Secondary | ICD-10-CM | POA: Diagnosis present

## 2018-11-23 DIAGNOSIS — E119 Type 2 diabetes mellitus without complications: Secondary | ICD-10-CM | POA: Diagnosis present

## 2018-11-23 DIAGNOSIS — G9341 Metabolic encephalopathy: Secondary | ICD-10-CM | POA: Diagnosis not present

## 2018-11-23 DIAGNOSIS — Z85828 Personal history of other malignant neoplasm of skin: Secondary | ICD-10-CM

## 2018-11-23 DIAGNOSIS — A419 Sepsis, unspecified organism: Secondary | ICD-10-CM

## 2018-11-23 DIAGNOSIS — G4730 Sleep apnea, unspecified: Secondary | ICD-10-CM | POA: Diagnosis present

## 2018-11-23 DIAGNOSIS — Z791 Long term (current) use of non-steroidal anti-inflammatories (NSAID): Secondary | ICD-10-CM

## 2018-11-23 DIAGNOSIS — N401 Enlarged prostate with lower urinary tract symptoms: Secondary | ICD-10-CM | POA: Diagnosis present

## 2018-11-23 DIAGNOSIS — G309 Alzheimer's disease, unspecified: Secondary | ICD-10-CM | POA: Diagnosis present

## 2018-11-23 DIAGNOSIS — Z79899 Other long term (current) drug therapy: Secondary | ICD-10-CM

## 2018-11-23 DIAGNOSIS — Z7902 Long term (current) use of antithrombotics/antiplatelets: Secondary | ICD-10-CM

## 2018-11-23 DIAGNOSIS — J101 Influenza due to other identified influenza virus with other respiratory manifestations: Secondary | ICD-10-CM

## 2018-11-23 DIAGNOSIS — Z86010 Personal history of colonic polyps: Secondary | ICD-10-CM

## 2018-11-23 DIAGNOSIS — Z8249 Family history of ischemic heart disease and other diseases of the circulatory system: Secondary | ICD-10-CM

## 2018-11-23 DIAGNOSIS — Z880 Allergy status to penicillin: Secondary | ICD-10-CM

## 2018-11-23 DIAGNOSIS — N183 Chronic kidney disease, stage 3 unspecified: Secondary | ICD-10-CM

## 2018-11-23 DIAGNOSIS — G9340 Encephalopathy, unspecified: Secondary | ICD-10-CM

## 2018-11-23 DIAGNOSIS — E1122 Type 2 diabetes mellitus with diabetic chronic kidney disease: Secondary | ICD-10-CM

## 2018-11-23 DIAGNOSIS — R0689 Other abnormalities of breathing: Secondary | ICD-10-CM | POA: Diagnosis not present

## 2018-11-23 DIAGNOSIS — R0902 Hypoxemia: Secondary | ICD-10-CM | POA: Diagnosis not present

## 2018-11-23 DIAGNOSIS — F0150 Vascular dementia without behavioral disturbance: Secondary | ICD-10-CM | POA: Diagnosis present

## 2018-11-23 DIAGNOSIS — Z8673 Personal history of transient ischemic attack (TIA), and cerebral infarction without residual deficits: Secondary | ICD-10-CM

## 2018-11-23 DIAGNOSIS — I252 Old myocardial infarction: Secondary | ICD-10-CM

## 2018-11-23 DIAGNOSIS — R404 Transient alteration of awareness: Secondary | ICD-10-CM | POA: Diagnosis not present

## 2018-11-23 DIAGNOSIS — J1000 Influenza due to other identified influenza virus with unspecified type of pneumonia: Principal | ICD-10-CM | POA: Diagnosis present

## 2018-11-23 DIAGNOSIS — R918 Other nonspecific abnormal finding of lung field: Secondary | ICD-10-CM | POA: Diagnosis not present

## 2018-11-23 DIAGNOSIS — G4700 Insomnia, unspecified: Secondary | ICD-10-CM | POA: Diagnosis present

## 2018-11-23 DIAGNOSIS — R000 Tachycardia, unspecified: Secondary | ICD-10-CM | POA: Diagnosis not present

## 2018-11-23 DIAGNOSIS — Z7982 Long term (current) use of aspirin: Secondary | ICD-10-CM

## 2018-11-23 DIAGNOSIS — Z820 Family history of epilepsy and other diseases of the nervous system: Secondary | ICD-10-CM

## 2018-11-23 DIAGNOSIS — F0280 Dementia in other diseases classified elsewhere without behavioral disturbance: Secondary | ICD-10-CM | POA: Diagnosis present

## 2018-11-23 DIAGNOSIS — E785 Hyperlipidemia, unspecified: Secondary | ICD-10-CM | POA: Diagnosis not present

## 2018-11-23 DIAGNOSIS — Z7984 Long term (current) use of oral hypoglycemic drugs: Secondary | ICD-10-CM

## 2018-11-23 DIAGNOSIS — J1081 Influenza due to other identified influenza virus with encephalopathy: Secondary | ICD-10-CM | POA: Diagnosis not present

## 2018-11-23 DIAGNOSIS — Z888 Allergy status to other drugs, medicaments and biological substances status: Secondary | ICD-10-CM

## 2018-11-23 DIAGNOSIS — Z881 Allergy status to other antibiotic agents status: Secondary | ICD-10-CM

## 2018-11-23 DIAGNOSIS — R351 Nocturia: Secondary | ICD-10-CM | POA: Diagnosis present

## 2018-11-23 DIAGNOSIS — I10 Essential (primary) hypertension: Secondary | ICD-10-CM | POA: Diagnosis present

## 2018-11-23 DIAGNOSIS — I2510 Atherosclerotic heart disease of native coronary artery without angina pectoris: Secondary | ICD-10-CM | POA: Diagnosis present

## 2018-11-23 DIAGNOSIS — E1169 Type 2 diabetes mellitus with other specified complication: Secondary | ICD-10-CM | POA: Diagnosis present

## 2018-11-23 DIAGNOSIS — M1710 Unilateral primary osteoarthritis, unspecified knee: Secondary | ICD-10-CM | POA: Diagnosis present

## 2018-11-23 DIAGNOSIS — Z87891 Personal history of nicotine dependence: Secondary | ICD-10-CM

## 2018-11-23 LAB — COMPREHENSIVE METABOLIC PANEL
ALT: 20 U/L (ref 0–44)
AST: 40 U/L (ref 15–41)
Albumin: 3.6 g/dL (ref 3.5–5.0)
Alkaline Phosphatase: 47 U/L (ref 38–126)
Anion gap: 7 (ref 5–15)
BUN: 17 mg/dL (ref 8–23)
CO2: 22 mmol/L (ref 22–32)
Calcium: 8.3 mg/dL — ABNORMAL LOW (ref 8.9–10.3)
Chloride: 107 mmol/L (ref 98–111)
Creatinine, Ser: 1.17 mg/dL (ref 0.61–1.24)
GFR calc non Af Amer: 55 mL/min — ABNORMAL LOW (ref >60)
Glucose, Bld: 132 mg/dL — ABNORMAL HIGH (ref 70–99)
Potassium: 3.7 mmol/L (ref 3.5–5.1)
Sodium: 136 mmol/L (ref 135–145)
Total Bilirubin: 0.5 mg/dL (ref 0.3–1.2)
Total Protein: 6.5 g/dL (ref 6.5–8.1)

## 2018-11-23 LAB — CBC
HCT: 34 % — ABNORMAL LOW (ref 39.0–52.0)
HEMOGLOBIN: 10.7 g/dL — AB (ref 13.0–17.0)
MCH: 27.2 pg (ref 26.0–34.0)
MCHC: 31.5 g/dL (ref 30.0–36.0)
MCV: 86.3 fL (ref 80.0–100.0)
Platelets: 238 10*3/uL (ref 150–400)
RBC: 3.94 MIL/uL — ABNORMAL LOW (ref 4.22–5.81)
RDW: 16.2 % — ABNORMAL HIGH (ref 11.5–15.5)
WBC: 7.1 10*3/uL (ref 4.0–10.5)
nRBC: 0 % (ref 0.0–0.2)

## 2018-11-23 LAB — URINALYSIS, COMPLETE (UACMP) WITH MICROSCOPIC
Bacteria, UA: NONE SEEN
Bilirubin Urine: NEGATIVE
Glucose, UA: NEGATIVE mg/dL
Ketones, ur: NEGATIVE mg/dL
Leukocytes, UA: NEGATIVE
Nitrite: NEGATIVE
Protein, ur: 30 mg/dL — AB
SPECIFIC GRAVITY, URINE: 1.021 (ref 1.005–1.030)
WBC, UA: NONE SEEN WBC/hpf (ref 0–5)
pH: 6 (ref 5.0–8.0)

## 2018-11-23 LAB — INFLUENZA PANEL BY PCR (TYPE A & B)
INFLAPCR: POSITIVE — AB
INFLBPCR: NEGATIVE

## 2018-11-23 LAB — CG4 I-STAT (LACTIC ACID): Lactic Acid, Venous: 1.06 mmol/L (ref 0.5–1.9)

## 2018-11-23 LAB — PROCALCITONIN: Procalcitonin: <0.1 ng/mL

## 2018-11-23 MED ORDER — METRONIDAZOLE IN NACL 5-0.79 MG/ML-% IV SOLN
500.0000 mg | Freq: Three times a day (TID) | INTRAVENOUS | Status: DC
  Administered 2018-11-23 – 2018-11-24 (×3): 500 mg via INTRAVENOUS
  Filled 2018-11-23 (×4): qty 100

## 2018-11-23 MED ORDER — SODIUM CHLORIDE 0.9 % IV BOLUS
1000.0000 mL | Freq: Once | INTRAVENOUS | Status: AC
  Administered 2018-11-23: 1000 mL via INTRAVENOUS

## 2018-11-23 MED ORDER — VANCOMYCIN HCL IN DEXTROSE 1-5 GM/200ML-% IV SOLN
1000.0000 mg | Freq: Once | INTRAVENOUS | Status: AC
  Administered 2018-11-23: 1000 mg via INTRAVENOUS
  Filled 2018-11-23: qty 200

## 2018-11-23 MED ORDER — ACETAMINOPHEN 500 MG PO TABS
1000.0000 mg | ORAL_TABLET | Freq: Once | ORAL | Status: AC
  Administered 2018-11-23: 1000 mg via ORAL
  Filled 2018-11-23: qty 2

## 2018-11-23 MED ORDER — VANCOMYCIN HCL 10 G IV SOLR
1250.0000 mg | INTRAVENOUS | Status: DC
  Administered 2018-11-24: 1250 mg via INTRAVENOUS
  Filled 2018-11-23 (×2): qty 1250

## 2018-11-23 MED ORDER — SODIUM CHLORIDE 0.9 % IV SOLN
2.0000 g | Freq: Once | INTRAVENOUS | Status: AC
  Administered 2018-11-23: 2 g via INTRAVENOUS
  Filled 2018-11-23: qty 2

## 2018-11-23 MED ORDER — SODIUM CHLORIDE 0.9 % IV SOLN
2.0000 g | Freq: Two times a day (BID) | INTRAVENOUS | Status: DC
  Administered 2018-11-24: 2 g via INTRAVENOUS
  Filled 2018-11-23 (×3): qty 2

## 2018-11-23 MED ORDER — OSELTAMIVIR PHOSPHATE 75 MG PO CAPS
75.0000 mg | ORAL_CAPSULE | ORAL | Status: AC
  Administered 2018-11-23: 75 mg via ORAL
  Filled 2018-11-23: qty 1

## 2018-11-23 NOTE — Progress Notes (Signed)
Pharmacy Antibiotic Note  Stephen [name] is a 85 y.o. male admitted on 11/23/2018 with unknown source.  Pharmacy has been consulted for vancomycin and cefepime dosing.  Plan: DW 88kg Vd 57L kei 0.039 hr-1  T1/2 18 hours  Vancomycin 1250 mg IV Q 24 hrs. Goal AUC 400-550. Expected AUC: 563 SCr used: 1.17   Height: 5\' 8"  (172.7 cm) Weight: 194 lb 8 oz (88.2 kg) IBW/kg (Calculated) : 68.4  Temp (24hrs), Avg:99.8 F (37.7 C), Min:99.8 F (37.7 C), Max:99.8 F (37.7 C)  Recent Labs  Lab 11/23/18 2110 11/23/18 2111  WBC 7.1  --   CREATININE 1.17  --   LATICACIDVEN  --  1.06    Estimated Creatinine Clearance: 46.2 mL/min (by C-G formula based on SCr of 1.17 mg/dL).    Allergies  Allergen Reactions  . Aricept [Donepezil Hcl] Diarrhea    severe  . Belsomra [Suvorexant] Other (See Comments)    Anxiety. Did not work  . Vancomycin Itching, Rash and Other (See Comments)    Burning pain   . Ambien [Zolpidem Tartrate] Other (See Comments)    Gi problems  . Atorvastatin     muscle aches  . Doxycycline Rash  . Penicillins Hives    Has patient had a PCN reaction causing immediate rash, facial/tongue/throat swelling, SOB or lightheadedness with hypotension: Yes Has patient had a PCN reaction causing severe rash involving mucus membranes or skin necrosis: Yes Has patient had a PCN reaction that required hospitalization No Has patient had a PCN reaction occurring within the last 10 years: No If all of the above answers are "NO", then may proceed with Cephalosporin use.   . Tamsulosin Other (See Comments)    Leg weakness.     Antimicrobials this admission: Metronidazole, vancomycin, cefepime  >>    >>   Dose adjustments this admission:   Microbiology results: 1/12 BCx: pending 1/12 UCx: pending      1/12 UA: (-) 1/12 CXR: R basilar opacity  Thank you for allowing pharmacy to be a part of this patient's care.  Amiya Escamilla S 11/23/2018 10:45 PM

## 2018-11-23 NOTE — ED Triage Notes (Signed)
Pt arrives via [facility] with c/o AMS and fever since about 1900 this afternoon. Per EMS, pt has CBG 146, RR 27, and temp 100.9, and 500 bolus of fluid given. Pt is not oriented to place at this time.

## 2018-11-23 NOTE — Progress Notes (Signed)
CODE SEPSIS - PHARMACY COMMUNICATION  **Broad Spectrum Antibiotics should be administered within 1 hour of Sepsis diagnosis**  Time Code Sepsis Called/Page Received: 0112 2126  Antibiotics Ordered: 0112 2126  Time of 1st antibiotic administration: 0112 2145  Additional action taken by pharmacy:   If necessary, Name of Provider/Nurse Contacted:     Eloise Harman ,PharmD Clinical Pharmacist  11/23/2018  10:36 PM

## 2018-11-23 NOTE — ED Provider Notes (Signed)
[facility] Emergency Department Provider Note  ____________________________________________  Time seen: Approximately 11:02 PM  I have reviewed the triage vital signs and the nursing notes.   HISTORY  Chief Complaint Altered Mental Status    Level 5 Caveat: Portions of the History and Physical including HPI and review of systems are unable to be completely obtained due to patient being a poor historian   HPI Stephen [name] is a 85 y.o. male sent to the ED due to fever and altered mental status, first noticed that about 7:00 PM today.  Temp of 100.9 per EMS.  Patient was given IV fluids.  Patient denies any complaints except for feeling tired.      Past Medical History:  Diagnosis Date  . Arthritis of knee   . BPH (benign prostatic hypertrophy)    w/ nocturia  . Coronary artery disease 03/2014   Inferior ST elevation myocardial infarction. Cardiac catheterization showed an occluded mid RCA. He had an angioplasty and drug-eluting stent placement with a 3.0 x 16 mm Promus drug-eluting stent. Ejection fraction was 45% by echo, completed cardiac rehab 06/2014  . CVA (cerebral vascular accident) ([place]) 03/2018  . Dementia ([place])    s/p stroke  . Diabetes type 2, controlled ([place])   . Heart murmur    keeping an eye on it but not treating  . History of basal cell cancer    s/p mohs  . History of colon polyps   . History of hiatal hernia 05/2018   this causes laryngeal spasms. pt was taking nitro for this when bp drops and he ends up with tia symptoms  . History of hypertension   . HLD (hyperlipidemia)    diet controlled in past  . Hypertension   . Insomnia    treated with multiple meds in past  . MI (myocardial infarction) ([place]) 2015  . Rosacea   . Skin cancer   . Sleep apnea    has cpap but does not use properly and does not like it!!  . Stroke ([place]) 03/2018   per cat scan, patient has had several strokes  . TIA (transient ischemic attack)    x 2  since CVA 03-2018  . UTI (urinary tract infection)      Patient Active Problem List   Diagnosis Date Noted  . Left knee pain 07/25/2018  . Abdominal discomfort 06/26/2018  . DNR (do not resuscitate) 05/05/2018  . Chest pain 04/27/2018  . Carotid stenosis, symptomatic, with infarction ([place]) 03/30/2018  . Aphasia due to acute stroke ([place]) 03/30/2018  . CVA (cerebral vascular accident) ([place]) 03/15/2018  . Systolic murmur 16/08/9603  . Complex sleep apnea syndrome 11/23/2017  . PLMD (periodic limb movement disorder) 11/23/2017  . Vascular dementia ([place]) 12/17/2016  . Paresthesias 12/12/2015  . Anemia, unspecified 12/12/2015  . Hiatal hernia 05/23/2015  . Pulmonary interstitial fibrosis ([place]) 02/15/2015  . Hepatic lesion 02/15/2015  . Dysphagia 01/07/2015  . Advanced care planning/counseling discussion 11/29/2014  . Constipation 03/31/2014  . Coronary artery disease 03/12/2014  . Medicare annual wellness visit, subsequent 11/27/2013  . HLD (hyperlipidemia) 11/27/2013  . Insomnia   . Diabetes type 2, controlled ([place])   . Benign prostatic hyperplasia   . Rosacea   . History of colon polyps      Past Surgical History:  Procedure Laterality Date  . CARDIAC CATHETERIZATION  03/2014   [facility];x1 stent  . CAROTID PTA/STENT INTERVENTION Right 05/2018   [facility]  . CAROTID PTA/STENT INTERVENTION Right  06/05/2018   Procedure: CAROTID PTA/STENT INTERVENTION;  Surgeon: Algernon Huxley, MD;  Location: [facility];  Service: Cardiovascular;  Laterality: Right;  . CATARACT EXTRACTION W/PHACO Left 10/09/2016   Procedure: CATARACT EXTRACTION PHACO AND INTRAOCULAR LENS PLACEMENT (IOC);  Surgeon: Birder Robson, MD;  Location: [facility] ORS;  Service: Ophthalmology;  Laterality: Left;  [place] 1.13 AP% 18.3 CDE 13.45 Fluid pack lot # [id number]  . COLONOSCOPY  08/2010   hyperplastic polyp, rec rpt 5 yrs  . ESOPHAGOGASTRODUODENOSCOPY  05/2015   dilated stricture, normal biopsies, HH, no definite  infection [name] @ [facility])  . EYE SURGERY Left    cataract extraction  . MOHS SURGERY     basal cell chin/back  . REPLACEMENT TOTAL KNEE Left 08/2006  . VASECTOMY  1970     Prior to Admission medications   Medication Sig Start Date End Date Taking? Authorizing Provider  acetaminophen (TYLENOL) 500 MG tablet Take 1 tablet (500 mg total) by mouth 2 (two) times daily. 07/25/18   Ria Bush, MD  aspirin EC 81 MG tablet Take 81 mg by mouth every morning.    [provider]  clonazePAM (KLONOPIN) 1 MG tablet Take 1 tablet (1 mg total) by mouth at bedtime. Patient taking differently: Take 0.5-1 mg by mouth at bedtime. Taking 1/5 tablet 06/26/18   Ria Bush, MD  clopidogrel (PLAVIX) 75 MG tablet TAKE 1 TABLET (75 MG TOTAL) BY MOUTH AT BEDTIME. 10/22/18   Ria Bush, MD  diclofenac sodium (VOLTAREN) 1 % GEL APPLY 2 G TOPICALLY 3 (THREE) TIMES DAILY 09/01/18   [provider]  doxazosin (CARDURA) 1 MG tablet TAKE 1 TABLET BY MOUTH EVERY DAY 10/24/18   Ria Bush, MD  finasteride (PROSCAR) 5 MG tablet TAKE 1 TABLET BY MOUTH EVERY DAY 09/12/18   Ria Bush, MD  memantine (NAMENDA) 5 MG tablet Take 5 mg by mouth 2 (two) times daily.    [provider]  metFORMIN (GLUCOPHAGE) 500 MG tablet TAKE 1 TABLET (500 MG TOTAL) BY MOUTH 2 (TWO) TIMES DAILY WITH A MEAL. 07/16/18   Ria Bush, MD  Multiple Vitamin (MULTIVITAMIN WITH MINERALS) TABS tablet Take 1 tablet by mouth daily.    [provider]  pantoprazole (PROTONIX) 40 MG tablet Take 1 tablet (40 mg total) by mouth 2 (two) times daily. 08/15/18   Ria Bush, MD  pravastatin (PRAVACHOL) 40 MG tablet TAKE 1 TABLET (40 MG TOTAL) BY MOUTH DAILY. 02/25/18   Ria Bush, MD  vitamin B-12 (CYANOCOBALAMIN) 500 MCG tablet Take 500 mcg by mouth daily.    [provider]     Allergies Aricept [donepezil hcl]; Belsomra [suvorexant]; Vancomycin; Ambien [zolpidem tartrate];  Atorvastatin; Doxycycline; Penicillins; and Tamsulosin   Family History  Problem Relation Age of Onset  . Cancer Father 64       prostate and colon  . Cancer Mother        ovarian or uterine  . CAD Brother 67       MI, smoker  . Parkinson's disease Brother   . COPD Brother   . CAD Brother        MI  . Stroke Sister     Social History Social History   Tobacco Use  . Smoking status: Former Smoker    Types: Cigarettes    Last attempt to quit: 11/12/1962    Years since quitting: 56.0  . Smokeless tobacco: Never Used  . Tobacco comment: Quit 1964  Substance Use Topics  . Alcohol  use: Yes    Alcohol/week: 1.0 standard drinks    Types: 1 Cans of beer per week    Comment: rarely  . Drug use: No    Review of Systems Level 5 Caveat: Portions of the History and Physical including HPI and review of systems are unable to be completely obtained due to patient being a poor historian   Constitutional: Positive fever.   Cardiovascular:   No chest pain or syncope. Respiratory:   No dyspnea. Gastrointestinal:   Negative for abdominal pain, vomiting and diarrhea.  Musculoskeletal:   Negative for focal pain or swelling ____________________________________________   PHYSICAL EXAM:  VITAL SIGNS: ED Triage Vitals  Enc Vitals Group     BP 11/23/18 2104 124/72     Pulse Rate 11/23/18 2104 (!) 110     Resp 11/23/18 2104 18     Temp 11/23/18 2104 99.8 F (37.7 C)     Temp Source 11/23/18 2104 Oral     SpO2 11/23/18 2104 97 %     Weight 11/23/18 2107 194 lb 8 oz (88.2 kg)     Height 11/23/18 2107 5\' 8"  (1.727 m)     Head Circumference --      Peak Flow --      Pain Score 11/23/18 2107 0     Pain Loc --      Pain Edu? --      Excl. in [place]? --     Vital signs reviewed, nursing assessments reviewed.   Constitutional:   Alert and oriented to self.  Ill-appearing. Eyes:   Conjunctivae are normal. EOMI. PERRL. ENT      Head:   Normocephalic and atraumatic.      Nose:   No  congestion/rhinnorhea.       Mouth/Throat:   Dry mucous membranes, no pharyngeal erythema. No peritonsillar mass.       Neck:   No meningismus. Full ROM.  Trachea midline Hematological/Lymphatic/Immunilogical:   No cervical lymphadenopathy. Cardiovascular:   Tachycardia heart rate 110. Symmetric bilateral radial and DP pulses.  No murmurs. Cap refill less than 2 seconds. Respiratory: Tachypnea.  No focal crackles or wheezing.  Symmetric air entry. Gastrointestinal:   Soft and nontender. Non distended. There is no CVA tenderness.  No rebound, rigidity, or guarding. Musculoskeletal:   Normal range of motion in all extremities. No joint effusions.  No lower extremity tenderness.  No edema. Neurologic:   Normal speech and language.  Motor grossly intact. No acute focal neurologic deficits are appreciated.  Skin:    Skin is warm, dry and intact. No rash noted.  No petechiae, purpura, or bullae.  ____________________________________________    LABS (pertinent positives/negatives) (all labs ordered are listed, but only abnormal results are displayed) Labs Reviewed  COMPREHENSIVE METABOLIC PANEL - Abnormal; Notable for the following components:      Result Value   Glucose, Bld 132 (*)    Calcium 8.3 (*)    GFR calc non Af Amer 55 (*)    All other components within normal limits  CBC - Abnormal; Notable for the following components:   RBC 3.94 (*)    Hemoglobin 10.7 (*)    HCT 34.0 (*)    RDW 16.2 (*)    All other components within normal limits  INFLUENZA PANEL BY PCR (TYPE A & B) - Abnormal; Notable for the following components:   Influenza A By PCR POSITIVE (*)    All other components within normal limits  URINALYSIS, COMPLETE (UACMP) WITH  MICROSCOPIC - Abnormal; Notable for the following components:   Color, Urine YELLOW (*)    APPearance CLEAR (*)    Hgb urine dipstick SMALL (*)    Protein, ur 30 (*)    All other components within normal limits  CULTURE, BLOOD (ROUTINE X 2)   CULTURE, BLOOD (ROUTINE X 2)  URINE CULTURE  PROCALCITONIN  CBG MONITORING, ED  CG4 I-STAT (LACTIC ACID)  I-STAT CG4 LACTIC ACID, ED   ____________________________________________   EKG    ____________________________________________    RADIOLOGY  Dg Chest Port 1 View  Result Date: 11/23/2018 CLINICAL DATA:  Initial evaluation for acute altered mental status. EXAM: PORTABLE CHEST 1 VIEW COMPARISON:  Prior radiograph from 04/26/2018. FINDINGS: Cardiomegaly.  Mediastinal silhouette within normal limits. Lungs hypoinflated. Perihilar vascular congestion with mild diffuse interstitial prominence, suggesting mild pulmonary interstitial edema. Small right pleural effusion. Associated right basilar opacity could reflect atelectasis or possibly infiltrate. No pneumothorax. No acute osseous finding. IMPRESSION: Mild diffuse pulmonary interstitial congestion/edema with associated small right pleural effusion. Associated right basilar opacity could reflect atelectasis or infiltrate. Electronically Signed   By: Jeannine Boga M.D.   On: 11/23/2018 22:13    ____________________________________________   PROCEDURES .Critical Care Performed by: Carrie Mew, MD Authorized by: Carrie Mew, MD   Critical care provider statement:    Critical care time (minutes):  30   Critical care time was exclusive of:  Separately billable procedures and treating other patients   Critical care was necessary to treat or prevent imminent or life-threatening deterioration of the following conditions:  Sepsis and CNS failure or compromise   Critical care was time spent personally by me on the following activities:  Development of treatment plan with patient or surrogate, discussions with consultants, evaluation of patient's response to treatment, examination of patient, obtaining history from patient or surrogate, ordering and performing treatments and interventions, ordering and review of  laboratory studies, ordering and review of radiographic studies, pulse oximetry, re-evaluation of patient's condition and review of old charts    ____________________________________________  DIFFERENTIAL DIAGNOSIS   Pneumonia, urinary tract infection, influenza.  No evidence of skin or soft tissue infection.  Doubt meningitis or encephalitis.  Doubt ACS PE dissection carditis or pneumothorax.  No evidence of intra-abdominal pathology  CLINICAL IMPRESSION / ASSESSMENT AND PLAN / ED COURSE  Pertinent labs & imaging results that were available during my care of the patient were reviewed by me and considered in my medical decision making (see chart for details).    Patient presents with fever tachycardia tachypnea, concerning for sepsis.  Empiric antibiotics ordered while obtaining a work-up.  In the process of work-up, influenza A test is positive.  X-ray shows areas of infiltrate/and fluid inflammation suggestive of pneumonitis.  Can follow-up pro calcitonin to further confirm that his illness is all due to viral infection from influenza and not bacterial at which point antibiotics could be discontinued.  Case discussed with the hospitalist for admission due to age and potential for respiratory deterioration as his illness evolves..      ____________________________________________   FINAL CLINICAL IMPRESSION(S) / ED DIAGNOSES    Final diagnoses:  Influenza A  Sepsis, due to unspecified organism, unspecified whether acute organ dysfunction present [facility])  Acute encephalopathy   ED Discharge Orders    None      Portions of this note were generated with dragon dictation software. Dictation errors may occur despite best attempts at proofreading.   Carrie Mew, MD 11/23/18 [id number]

## 2018-11-24 DIAGNOSIS — Z87891 Personal history of nicotine dependence: Secondary | ICD-10-CM | POA: Diagnosis not present

## 2018-11-24 DIAGNOSIS — I10 Essential (primary) hypertension: Secondary | ICD-10-CM | POA: Diagnosis present

## 2018-11-24 DIAGNOSIS — R351 Nocturia: Secondary | ICD-10-CM | POA: Diagnosis present

## 2018-11-24 DIAGNOSIS — Z85828 Personal history of other malignant neoplasm of skin: Secondary | ICD-10-CM | POA: Diagnosis not present

## 2018-11-24 DIAGNOSIS — J101 Influenza due to other identified influenza virus with other respiratory manifestations: Secondary | ICD-10-CM | POA: Diagnosis not present

## 2018-11-24 DIAGNOSIS — Z86010 Personal history of colonic polyps: Secondary | ICD-10-CM | POA: Diagnosis not present

## 2018-11-24 DIAGNOSIS — J8410 Pulmonary fibrosis, unspecified: Secondary | ICD-10-CM | POA: Diagnosis not present

## 2018-11-24 DIAGNOSIS — B957 Other staphylococcus as the cause of diseases classified elsewhere: Secondary | ICD-10-CM | POA: Diagnosis present

## 2018-11-24 DIAGNOSIS — I252 Old myocardial infarction: Secondary | ICD-10-CM | POA: Diagnosis not present

## 2018-11-24 DIAGNOSIS — J1081 Influenza due to other identified influenza virus with encephalopathy: Secondary | ICD-10-CM | POA: Diagnosis present

## 2018-11-24 DIAGNOSIS — R262 Difficulty in walking, not elsewhere classified: Secondary | ICD-10-CM | POA: Diagnosis not present

## 2018-11-24 DIAGNOSIS — Z8673 Personal history of transient ischemic attack (TIA), and cerebral infarction without residual deficits: Secondary | ICD-10-CM | POA: Diagnosis not present

## 2018-11-24 DIAGNOSIS — A419 Sepsis, unspecified organism: Secondary | ICD-10-CM | POA: Insufficient documentation

## 2018-11-24 DIAGNOSIS — E119 Type 2 diabetes mellitus without complications: Secondary | ICD-10-CM | POA: Diagnosis present

## 2018-11-24 DIAGNOSIS — R4182 Altered mental status, unspecified: Secondary | ICD-10-CM | POA: Diagnosis not present

## 2018-11-24 DIAGNOSIS — Z820 Family history of epilepsy and other diseases of the nervous system: Secondary | ICD-10-CM | POA: Diagnosis not present

## 2018-11-24 DIAGNOSIS — I2510 Atherosclerotic heart disease of native coronary artery without angina pectoris: Secondary | ICD-10-CM | POA: Diagnosis not present

## 2018-11-24 DIAGNOSIS — G309 Alzheimer's disease, unspecified: Secondary | ICD-10-CM | POA: Diagnosis present

## 2018-11-24 DIAGNOSIS — E785 Hyperlipidemia, unspecified: Secondary | ICD-10-CM | POA: Diagnosis present

## 2018-11-24 DIAGNOSIS — F0280 Dementia in other diseases classified elsewhere without behavioral disturbance: Secondary | ICD-10-CM | POA: Diagnosis present

## 2018-11-24 DIAGNOSIS — G4730 Sleep apnea, unspecified: Secondary | ICD-10-CM | POA: Diagnosis present

## 2018-11-24 DIAGNOSIS — M1710 Unilateral primary osteoarthritis, unspecified knee: Secondary | ICD-10-CM | POA: Diagnosis present

## 2018-11-24 DIAGNOSIS — N401 Enlarged prostate with lower urinary tract symptoms: Secondary | ICD-10-CM | POA: Diagnosis present

## 2018-11-24 DIAGNOSIS — G9341 Metabolic encephalopathy: Secondary | ICD-10-CM | POA: Diagnosis present

## 2018-11-24 DIAGNOSIS — G4700 Insomnia, unspecified: Secondary | ICD-10-CM | POA: Diagnosis present

## 2018-11-24 DIAGNOSIS — R0602 Shortness of breath: Secondary | ICD-10-CM | POA: Diagnosis not present

## 2018-11-24 DIAGNOSIS — J1000 Influenza due to other identified influenza virus with unspecified type of pneumonia: Secondary | ICD-10-CM | POA: Diagnosis present

## 2018-11-24 DIAGNOSIS — J111 Influenza due to unidentified influenza virus with other respiratory manifestations: Secondary | ICD-10-CM | POA: Diagnosis not present

## 2018-11-24 DIAGNOSIS — Z66 Do not resuscitate: Secondary | ICD-10-CM | POA: Diagnosis present

## 2018-11-24 DIAGNOSIS — F0150 Vascular dementia without behavioral disturbance: Secondary | ICD-10-CM | POA: Diagnosis present

## 2018-11-24 LAB — CBC
HCT: 33.9 % — ABNORMAL LOW (ref 39.0–52.0)
Hemoglobin: 10.4 g/dL — ABNORMAL LOW (ref 13.0–17.0)
MCH: 26.1 pg (ref 26.0–34.0)
MCHC: 30.7 g/dL (ref 30.0–36.0)
MCV: 85.2 fL (ref 80.0–100.0)
Platelets: 221 10*3/uL (ref 150–400)
RBC: 3.98 MIL/uL — ABNORMAL LOW (ref 4.22–5.81)
RDW: 16.4 % — ABNORMAL HIGH (ref 11.5–15.5)
WBC: 6.5 10*3/uL (ref 4.0–10.5)
nRBC: 0 % (ref 0.0–0.2)

## 2018-11-24 LAB — BASIC METABOLIC PANEL
Anion gap: 8 (ref 5–15)
BUN: 14 mg/dL (ref 8–23)
CO2: 21 mmol/L — ABNORMAL LOW (ref 22–32)
CREATININE: 1.14 mg/dL (ref 0.61–1.24)
Calcium: 8 mg/dL — ABNORMAL LOW (ref 8.9–10.3)
Chloride: 109 mmol/L (ref 98–111)
GFR calc Af Amer: >60 mL/min (ref >60)
GFR calc non Af Amer: 57 mL/min — ABNORMAL LOW (ref >60)
Glucose, Bld: 129 mg/dL — ABNORMAL HIGH (ref 70–99)
Potassium: 3.5 mmol/L (ref 3.5–5.1)
Sodium: 138 mmol/L (ref 135–145)

## 2018-11-24 LAB — GLUCOSE, CAPILLARY
GLUCOSE-CAPILLARY: 95 mg/dL (ref 70–99)
Glucose-Capillary: 134 mg/dL — ABNORMAL HIGH (ref 70–99)
Glucose-Capillary: 117 mg/dL — ABNORMAL HIGH (ref 70–99)
Glucose-Capillary: 156 mg/dL — ABNORMAL HIGH (ref 70–99)
Glucose-Capillary: 131 mg/dL — ABNORMAL HIGH (ref 70–99)

## 2018-11-24 MED ORDER — OSELTAMIVIR PHOSPHATE 75 MG PO CAPS: 75.0000 mg | ORAL_CAPSULE | Freq: Two times a day (BID) | ORAL | Status: DC

## 2018-11-24 MED ORDER — FINASTERIDE 5 MG PO TABS
5.0000 mg | ORAL_TABLET | Freq: Every day | ORAL | Status: DC
  Administered 2018-11-24 – 2018-11-27 (×4): 5 mg via ORAL
  Filled 2018-11-24 (×4): qty 1

## 2018-11-24 MED ORDER — CEFAZOLIN SODIUM-DEXTROSE 2-4 GM/100ML-% IV SOLN
2.0000 g | Freq: Three times a day (TID) | INTRAVENOUS | Status: DC
  Administered 2018-11-24 – 2018-11-26 (×5): 2 g via INTRAVENOUS
  Filled 2018-11-24 (×7): qty 100

## 2018-11-24 MED ORDER — CLOPIDOGREL BISULFATE 75 MG PO TABS
75.0000 mg | ORAL_TABLET | Freq: Every day | ORAL | Status: DC
  Administered 2018-11-24 – 2018-11-26 (×3): 75 mg via ORAL
  Filled 2018-11-24 (×3): qty 1

## 2018-11-24 MED ORDER — ACETAMINOPHEN 650 MG RE SUPP: 650.0000 mg | Freq: Four times a day (QID) | RECTAL | Status: DC | PRN

## 2018-11-24 MED ORDER — ASPIRIN EC 81 MG PO TBEC
81.0000 mg | DELAYED_RELEASE_TABLET | ORAL | Status: DC
  Administered 2018-11-24: 81 mg via ORAL
  Filled 2018-11-24: qty 1

## 2018-11-24 MED ORDER — INSULIN ASPART 100 UNIT/ML ~~LOC~~ SOLN
0.0000 [IU] | Freq: Three times a day (TID) | SUBCUTANEOUS | Status: DC
  Administered 2018-11-24 – 2018-11-25 (×2): 2 [IU] via SUBCUTANEOUS
  Administered 2018-11-26: 1 [IU] via SUBCUTANEOUS
  Filled 2018-11-24 (×3): qty 1

## 2018-11-24 MED ORDER — MEMANTINE HCL 5 MG PO TABS
5.0000 mg | ORAL_TABLET | Freq: Two times a day (BID) | ORAL | Status: DC
  Administered 2018-11-24 – 2018-11-27 (×7): 5 mg via ORAL
  Filled 2018-11-24 (×7): qty 1

## 2018-11-24 MED ORDER — BENZONATATE 100 MG PO CAPS: 200.0000 mg | ORAL_CAPSULE | Freq: Three times a day (TID) | ORAL | Status: DC | PRN

## 2018-11-24 MED ORDER — PANTOPRAZOLE SODIUM 40 MG PO TBEC
40.0000 mg | DELAYED_RELEASE_TABLET | Freq: Two times a day (BID) | ORAL | Status: DC
  Administered 2018-11-24 – 2018-11-27 (×7): 40 mg via ORAL
  Filled 2018-11-24 (×7): qty 1

## 2018-11-24 MED ORDER — ONDANSETRON HCL 4 MG/2ML IJ SOLN: 4.0000 mg | Freq: Four times a day (QID) | INTRAMUSCULAR | Status: DC | PRN

## 2018-11-24 MED ORDER — CLONAZEPAM 1 MG PO TABS
1.0000 mg | ORAL_TABLET | Freq: Every evening | ORAL | Status: DC | PRN
  Administered 2018-11-24: 1 mg via ORAL
  Filled 2018-11-24: qty 1

## 2018-11-24 MED ORDER — INSULIN ASPART 100 UNIT/ML ~~LOC~~ SOLN: 0.0000 [IU] | Freq: Every day | SUBCUTANEOUS | Status: DC

## 2018-11-24 MED ORDER — SODIUM CHLORIDE 0.9 % IV SOLN
INTRAVENOUS | Status: AC
  Administered 2018-11-24: 01:00:00 via INTRAVENOUS

## 2018-11-24 MED ORDER — OSELTAMIVIR PHOSPHATE 30 MG PO CAPS
30.0000 mg | ORAL_CAPSULE | Freq: Two times a day (BID) | ORAL | Status: DC
  Administered 2018-11-24 – 2018-11-27 (×7): 30 mg via ORAL
  Filled 2018-11-24 (×8): qty 1

## 2018-11-24 MED ORDER — PRAVASTATIN SODIUM 20 MG PO TABS
40.0000 mg | ORAL_TABLET | Freq: Every day | ORAL | Status: DC
  Administered 2018-11-24 – 2018-11-26 (×3): 40 mg via ORAL
  Filled 2018-11-24 (×4): qty 2

## 2018-11-24 MED ORDER — ENOXAPARIN SODIUM 40 MG/0.4ML ~~LOC~~ SOLN
40.0000 mg | SUBCUTANEOUS | Status: DC
  Administered 2018-11-24 – 2018-11-26 (×3): 40 mg via SUBCUTANEOUS
  Filled 2018-11-24 (×3): qty 0.4

## 2018-11-24 MED ORDER — ACETAMINOPHEN 325 MG PO TABS
650.0000 mg | ORAL_TABLET | Freq: Four times a day (QID) | ORAL | Status: DC | PRN
  Administered 2018-11-24 (×2): 650 mg via ORAL
  Filled 2018-11-24 (×2): qty 2

## 2018-11-24 MED ORDER — GUAIFENESIN-DM 100-10 MG/5ML PO SYRP
5.0000 mL | ORAL_SOLUTION | ORAL | Status: DC | PRN
  Administered 2018-11-25: 5 mL via ORAL
  Filled 2018-11-24 (×2): qty 5

## 2018-11-24 MED ORDER — ONDANSETRON HCL 4 MG PO TABS: 4.0000 mg | ORAL_TABLET | Freq: Four times a day (QID) | ORAL | Status: DC | PRN

## 2018-11-24 NOTE — Progress Notes (Signed)
Size wise Low bed ordered. Confirmation [phone]

## 2018-11-24 NOTE — Progress Notes (Signed)
[place] [facility] [place] NAME: Stephen [name]    MR#:  [id number]  DATE OF BIRTH:  1927/02/03  SUBJECTIVE: Admitted for altered mental status and found to have influenza type A.  Patient does have cough, body pains, low-grade fever.  He says that he is feeling little better but still has body pains.  Family is at the bedside.  CHIEF COMPLAINT:   Chief Complaint  Patient presents with  . Altered Mental Status    REVIEW OF SYSTEMS:   ROS CONSTITUTIONAL: Generalized weakness, low-grade temperature eYES: No blurred or double vision.  EARS, NOSE, AND THROAT: No tinnitus or ear pain.  RESPIRATORY: No cough, shortness of breath, wheezing or hemoptysis.  CARDIOVASCULAR: No chest pain, orthopnea, edema.  GASTROINTESTINAL: No nausea, vomiting, diarrhea or abdominal pain.  GENITOURINARY: No dysuria, hematuria.  ENDOCRINE: No polyuria, nocturia,  HEMATOLOGY: No anemia, easy bruising or bleeding SKIN: No rash or lesion. MUSCULOSKELETAL: No joint pain or arthritis.   NEUROLOGIC: No tingling, numbness, weakness.  PSYCHIATRY: No anxiety or depression.   DRUG ALLERGIES:   Allergies  Allergen Reactions  . Aricept [Donepezil Hcl] Diarrhea    severe  . Belsomra [Suvorexant] Other (See Comments)    Anxiety. Did not work  . Vancomycin Itching, Rash and Other (See Comments)    Burning pain   . Ambien [Zolpidem Tartrate] Other (See Comments)    Gi problems  . Atorvastatin     muscle aches  . Doxycycline Rash  . Penicillins Hives    Has patient had a PCN reaction causing immediate rash, facial/tongue/throat swelling, SOB or lightheadedness with hypotension: Yes Has patient had a PCN reaction causing severe rash involving mucus membranes or skin necrosis: Yes Has patient had a PCN reaction that required hospitalization No Has patient had a PCN reaction occurring within the last 10 years: No If all of the above answers are "NO", then may proceed  with Cephalosporin use.   . Tamsulosin Other (See Comments)    Leg weakness.     VITALS:  Blood pressure 110/64, pulse 98, temperature 100.1 F (37.8 C), temperature source Oral, resp. rate 18, height 5\' 8"  (1.727 m), weight 88.2 kg, SpO2 98 %.  PHYSICAL EXAMINATION:  GENERAL:  85 y.o.-year-old patient lying in the bed with no acute distress.  EYES: Pupils equal, round, reactive to light and accommodation. No scleral icterus. Extraocular muscles intact.  HEENT: Head atraumatic, normocephalic. Oropharynx and nasopharynx clear.  NECK:  Supple, no jugular venous distention. No thyroid enlargement, no tenderness.  LUNGS: Normal breath sounds bilaterally, no wheezing, rales,rhonchi or crepitation. No use of accessory muscles of respiration.  CARDIOVASCULAR: S1, S2 normal. No murmurs, rubs, or gallops.  ABDOMEN: Soft, nontender, nondistended. Bowel sounds present. No organomegaly or mass.  EXTREMITIES: No pedal edema, cyanosis, or clubbing.  NEUROLOGIC: Cranial nerves II through XII are intact. Muscle strength 5/5 in all extremities. Sensation intact. Gait not checked.  PSYCHIATRIC: The patient is alert and oriented x 3.  SKIN: No obvious rash, lesion, or ulcer.    LABORATORY PANEL:   CBC Recent Labs  Lab 11/24/18 0341  WBC 6.5  HGB 10.4*  HCT 33.9*  PLT 221   ------------------------------------------------------------------------------------------------------------------  Chemistries  Recent Labs  Lab 11/23/18 2110 11/24/18 0341  NA 136 138  K 3.7 3.5  CL 107 109  CO2 22 21*  GLUCOSE 132* 129*  BUN 17 14  CREATININE 1.17 1.14  CALCIUM 8.3* 8.0*  AST 40  --  ALT 20  --   ALKPHOS 47  --   BILITOT 0.5  --    ------------------------------------------------------------------------------------------------------------------  Cardiac Enzymes No results for input(s): TROPONINI in the last 168  hours. ------------------------------------------------------------------------------------------------------------------  RADIOLOGY:  Dg Chest Port 1 View  Result Date: 11/23/2018 CLINICAL DATA:  Initial evaluation for acute altered mental status. EXAM: PORTABLE CHEST 1 VIEW COMPARISON:  Prior radiograph from 04/26/2018. FINDINGS: Cardiomegaly.  Mediastinal silhouette within normal limits. Lungs hypoinflated. Perihilar vascular congestion with mild diffuse interstitial prominence, suggesting mild pulmonary interstitial edema. Small right pleural effusion. Associated right basilar opacity could reflect atelectasis or possibly infiltrate. No pneumothorax. No acute osseous finding. IMPRESSION: Mild diffuse pulmonary interstitial congestion/edema with associated small right pleural effusion. Associated right basilar opacity could reflect atelectasis or infiltrate. Electronically Signed   By: Jeannine Boga M.D.   On: 11/23/2018 22:13    EKG:   Orders placed or performed in visit on 11/18/18  . EKG 12-Lead    ASSESSMENT AND PLAN:  Type influenza: Continue Tamiflu, supportive treatment. 2.  Right lower lobe pneumonia, continue aspirin precautions, continue empiric antibiotics, likely discontinue antibiotics tomorrow. 3.  Generalized weakness secondary to flu, pneumonia: Continue IV fluids, Tamiflu, IV antibiotics today, physical therapy consult tomorrow.  Discussed with patient's wife. 4.  Hyperlipidemia: Continue statins. History of CAD: Continue home medicines 5.  History of Alzheimer's dementia, start back on Klonopin, Namenda. 6.  History of BPH: Patient is on Proscar, Cardura.  #7 history of CVA, patient tolerated aspirin, Plavix.  History of coronary artery disease, carotid artery disease.  Had recent right ICA stent in late 2019.  Followed by vascular surgery Reviewed medical records from [facility]..  #8. metabolic encephalopathy secondary to flu, pneumonia, on top of  his underlying Alzheimer's dementia /vascular dementia: Patient back to baseline, UA did not show any infection.  Plan to get physical therapy tomorrow.  Continue IV fluids today. All the records are reviewed and case discussed with Care Management/Social Workerr. Management plans discussed with the patient, family and they are in agreement.  CODE STATUS: DNR  TOTAL TIME TAKING CARE OF THIS PATIENT: 40 minutes.   POSSIBLE D/C IN 1-2 DAYS, DEPENDING ON CLINICAL CONDITION.  More than 50% time spent in counseling, coordination of care, discussed with wife. Epifanio Lesches M.D on 11/24/2018 at 10:27 AM  Between 7am to 6pm - Pager - [phone]  After 6pm go to [email or web address] - password [email or web address] [place] [facility]  Office  [phone]  CC: Primary care physician; Ria Bush, MD   Note: This dictation was prepared with Dragon dictation along with smaller phrase technology. Any transcriptional errors that result from this process are unintentional.

## 2018-11-24 NOTE — Progress Notes (Signed)
PHARMACY - PHYSICIAN COMMUNICATION CRITICAL VALUE ALERT - BLOOD CULTURE IDENTIFICATION (BCID)  Stephen [name] is an 85 y.o. male who presented to [facility] on 11/23/2018 with a chief complaint of pneumonia and influenza  Assessment: Aerobic bottle, 1/4 bottles staph species mecA not detected (include suspected source if known)  Name of physician (or Provider) Contacted: Dr. Posey Pronto  Current antibiotics: Cefepime, vancomycin  Changes to prescribed antibiotics recommended:  Change to cefazolin 2 gm every 8 hours Recommendations accepted by provider  No results found for this or any previous visit.  [name] 11/24/2018  8:23 PM

## 2018-11-24 NOTE — Plan of Care (Signed)

## 2018-11-24 NOTE — H&P (Signed)
[facility] [place] NAME: Stephen [name]    MR#:  [id number]  DATE OF BIRTH:  October 12, 1927  DATE OF ADMISSION:  11/23/2018  PRIMARY CARE PHYSICIAN: Ria Bush, MD   REQUESTING/REFERRING PHYSICIAN: Joni Fears, MD  CHIEF COMPLAINT:   Chief Complaint  Patient presents with  . Altered Mental Status    HISTORY OF PRESENT ILLNESS:  Stephen [name]  is a 85 y.o. male who presents with chief complaint as above.  Patient presents to the ED with confusion, and cough.  Here in the ED he was found to meet sirs criteria, and treated initially with antibiotics.  However, patient was then found to be influenza A positive.  His chest x-ray shows some chronic interstitial changes, and is an indeterminate read for possible pneumonia.  However, his procalcitonin was less then 0.1 and his white blood cell count was within normal limits.  He was treated with Tamiflu, and hospitalist were called for admission  PAST MEDICAL HISTORY:   Past Medical History:  Diagnosis Date  . Arthritis of knee   . BPH (benign prostatic hypertrophy)    w/ nocturia  . Coronary artery disease 03/2014   Inferior ST elevation myocardial infarction. Cardiac catheterization showed an occluded mid RCA. He had an angioplasty and drug-eluting stent placement with a 3.0 x 16 mm Promus drug-eluting stent. Ejection fraction was 45% by echo, completed cardiac rehab 06/2014  . CVA (cerebral vascular accident) ([place]) 03/2018  . Dementia ([place])    s/p stroke  . Diabetes type 2, controlled ([place])   . Heart murmur    keeping an eye on it but not treating  . History of basal cell cancer    s/p mohs  . History of colon polyps   . History of hiatal hernia 05/2018   this causes laryngeal spasms. pt was taking nitro for this when bp drops and he ends up with tia symptoms  . History of hypertension   . HLD (hyperlipidemia)    diet controlled in past  . Hypertension   . Insomnia     treated with multiple meds in past  . MI (myocardial infarction) ([place]) 2015  . Rosacea   . Skin cancer   . Sleep apnea    has cpap but does not use properly and does not like it!!  . Stroke ([place]) 03/2018   per cat scan, patient has had several strokes  . TIA (transient ischemic attack)    x 2 since CVA 03-2018  . UTI (urinary tract infection)      PAST SURGICAL HISTORY:   Past Surgical History:  Procedure Laterality Date  . CARDIAC CATHETERIZATION  03/2014   [facility];x1 stent  . CAROTID PTA/STENT INTERVENTION Right 05/2018   [facility]  . CAROTID PTA/STENT INTERVENTION Right 06/05/2018   Procedure: CAROTID PTA/STENT INTERVENTION;  Surgeon: Algernon Huxley, MD;  Location: [facility];  Service: Cardiovascular;  Laterality: Right;  . CATARACT EXTRACTION W/PHACO Left 10/09/2016   Procedure: CATARACT EXTRACTION PHACO AND INTRAOCULAR LENS PLACEMENT (IOC);  Surgeon: Birder Robson, MD;  Location: [facility] ORS;  Service: Ophthalmology;  Laterality: Left;  [place] 1.13 AP% 18.3 CDE 13.45 Fluid pack lot # [id number]  . COLONOSCOPY  08/2010   hyperplastic polyp, rec rpt 5 yrs  . ESOPHAGOGASTRODUODENOSCOPY  05/2015   dilated stricture, normal biopsies, HH, no definite infection [name] @ [facility])  . EYE SURGERY Left    cataract extraction  . MOHS SURGERY  basal cell chin/back  . REPLACEMENT TOTAL KNEE Left 08/2006  . VASECTOMY  1970     SOCIAL HISTORY:   Social History   Tobacco Use  . Smoking status: Former Smoker    Types: Cigarettes    Last attempt to quit: 11/12/1962    Years since quitting: 56.0  . Smokeless tobacco: Never Used  . Tobacco comment: Quit 1964  Substance Use Topics  . Alcohol use: Yes    Alcohol/week: 1.0 standard drinks    Types: 1 Cans of beer per week    Comment: rarely     FAMILY HISTORY:   Family History  Problem Relation Age of Onset  . Cancer Father 12       prostate and colon  . Cancer Mother        ovarian or uterine  . CAD Brother 71       MI, smoker   . Parkinson's disease Brother   . COPD Brother   . CAD Brother        MI  . Stroke Sister      DRUG ALLERGIES:   Allergies  Allergen Reactions  . Aricept [Donepezil Hcl] Diarrhea    severe  . Belsomra [Suvorexant] Other (See Comments)    Anxiety. Did not work  . Vancomycin Itching, Rash and Other (See Comments)    Burning pain   . Ambien [Zolpidem Tartrate] Other (See Comments)    Gi problems  . Atorvastatin     muscle aches  . Doxycycline Rash  . Penicillins Hives    Has patient had a PCN reaction causing immediate rash, facial/tongue/throat swelling, SOB or lightheadedness with hypotension: Yes Has patient had a PCN reaction causing severe rash involving mucus membranes or skin necrosis: Yes Has patient had a PCN reaction that required hospitalization No Has patient had a PCN reaction occurring within the last 10 years: No If all of the above answers are "NO", then may proceed with Cephalosporin use.   . Tamsulosin Other (See Comments)    Leg weakness.     MEDICATIONS AT HOME:   Prior to Admission medications   Medication Sig Start Date End Date Taking? Authorizing Provider  acetaminophen (TYLENOL) 500 MG tablet Take 1 tablet (500 mg total) by mouth 2 (two) times daily. 07/25/18   Ria Bush, MD  aspirin EC 81 MG tablet Take 81 mg by mouth every morning.    [provider]  clonazePAM (KLONOPIN) 1 MG tablet Take 1 tablet (1 mg total) by mouth at bedtime. Patient taking differently: Take 0.5-1 mg by mouth at bedtime. Taking 1/5 tablet 06/26/18   Ria Bush, MD  clopidogrel (PLAVIX) 75 MG tablet TAKE 1 TABLET (75 MG TOTAL) BY MOUTH AT BEDTIME. 10/22/18   Ria Bush, MD  diclofenac sodium (VOLTAREN) 1 % GEL APPLY 2 G TOPICALLY 3 (THREE) TIMES DAILY 09/01/18   [provider]  doxazosin (CARDURA) 1 MG tablet TAKE 1 TABLET BY MOUTH EVERY DAY 10/24/18   Ria Bush, MD  finasteride (PROSCAR) 5 MG tablet TAKE 1 TABLET BY MOUTH EVERY  DAY 09/12/18   Ria Bush, MD  memantine (NAMENDA) 5 MG tablet Take 5 mg by mouth 2 (two) times daily.    [provider]  metFORMIN (GLUCOPHAGE) 500 MG tablet TAKE 1 TABLET (500 MG TOTAL) BY MOUTH 2 (TWO) TIMES DAILY WITH A MEAL. 07/16/18   Ria Bush, MD  Multiple Vitamin (MULTIVITAMIN WITH MINERALS) TABS tablet Take 1 tablet by mouth daily.    [provider]  pantoprazole (PROTONIX) 40 MG tablet Take 1 tablet (40 mg total) by mouth 2 (two) times daily. 08/15/18   Ria Bush, MD  pravastatin (PRAVACHOL) 40 MG tablet TAKE 1 TABLET (40 MG TOTAL) BY MOUTH DAILY. 02/25/18   Ria Bush, MD  vitamin B-12 (CYANOCOBALAMIN) 500 MCG tablet Take 500 mcg by mouth daily.    [provider]    REVIEW OF SYSTEMS:  Review of Systems  Constitutional: Negative for chills, fever, malaise/fatigue and weight loss.  HENT: Negative for ear pain, hearing loss and tinnitus.   Eyes: Negative for blurred vision, double vision, pain and redness.  Respiratory: Positive for cough and shortness of breath. Negative for hemoptysis.   Cardiovascular: Negative for chest pain, palpitations, orthopnea and leg swelling.  Gastrointestinal: Negative for abdominal pain, constipation, diarrhea, nausea and vomiting.  Genitourinary: Negative for dysuria, frequency and hematuria.  Musculoskeletal: Negative for back pain, joint pain and neck pain.  Skin:       No acne, rash, or lesions  Neurological: Negative for dizziness, tremors, focal weakness and weakness.  Endo/Heme/Allergies: Negative for polydipsia. Does not bruise/bleed easily.  Psychiatric/Behavioral: Negative for depression. The patient is not nervous/anxious and does not have insomnia.      VITAL SIGNS:   Vitals:   11/23/18 2107 11/23/18 2136 11/23/18 2230 11/23/18 2337  BP:  97/74 (!) 131/92 (!) 191/156  Pulse:  (!) 115 (!) 105 (!) 110  Resp:  (!) 25 (!) 29 18  Temp:      TempSrc:      SpO2:  95% 91% 97%   Weight: 88.2 kg     Height: 5\' 8"  (1.727 m)      Wt Readings from Last 3 Encounters:  11/23/18 88.2 kg  11/18/18 88.2 kg  10/20/18 87.5 kg    PHYSICAL EXAMINATION:  Physical Exam  Vitals reviewed. Constitutional: He is oriented to person, place, and time. He appears well-developed and well-nourished. No distress.  HENT:  Head: Normocephalic and atraumatic.  Mouth/Throat: Oropharynx is clear and moist.  Eyes: Pupils are equal, round, and reactive to light. Conjunctivae and EOM are normal. No scleral icterus.  Neck: Normal range of motion. Neck supple. No JVD present. No thyromegaly present.  Cardiovascular: Regular rhythm and intact distal pulses. Exam reveals no gallop and no friction rub.  No murmur heard. tachycardic  Respiratory: Effort normal. No respiratory distress. He has no wheezes. He has rales.  GI: Soft. Bowel sounds are normal. He exhibits no distension. There is no abdominal tenderness.  Musculoskeletal: Normal range of motion.        General: No edema.     Comments: No arthritis, no gout  Lymphadenopathy:    He has no cervical adenopathy.  Neurological: He is alert and oriented to person, place, and time. No cranial nerve deficit.  No dysarthria, no aphasia  Skin: Skin is warm and dry. No rash noted. No erythema.  Psychiatric: He has a normal mood and affect. His behavior is normal. Judgment and thought content normal.    LABORATORY PANEL:   CBC Recent Labs  Lab 11/23/18 2110  WBC 7.1  HGB 10.7*  HCT 34.0*  PLT 238   ------------------------------------------------------------------------------------------------------------------  Chemistries  Recent Labs  Lab 11/23/18 2110  NA 136  K 3.7  CL 107  CO2 22  GLUCOSE 132*  BUN 17  CREATININE 1.17  CALCIUM 8.3*  AST 40  ALT 20  ALKPHOS 47  BILITOT 0.5   ------------------------------------------------------------------------------------------------------------------  Cardiac Enzymes No  results for input(s): TROPONINI in the last 168 hours. ------------------------------------------------------------------------------------------------------------------  RADIOLOGY:  Dg Chest Port 1 View  Result Date: 11/23/2018 CLINICAL DATA:  Initial evaluation for acute altered mental status. EXAM: PORTABLE CHEST 1 VIEW COMPARISON:  Prior radiograph from 04/26/2018. FINDINGS: Cardiomegaly.  Mediastinal silhouette within normal limits. Lungs hypoinflated. Perihilar vascular congestion with mild diffuse interstitial prominence, suggesting mild pulmonary interstitial edema. Small right pleural effusion. Associated right basilar opacity could reflect atelectasis or possibly infiltrate. No pneumothorax. No acute osseous finding. IMPRESSION: Mild diffuse pulmonary interstitial congestion/edema with associated small right pleural effusion. Associated right basilar opacity could reflect atelectasis or infiltrate. Electronically Signed   By: Jeannine Boga M.D.   On: 11/23/2018 22:13    EKG:   Orders placed or performed in visit on 11/18/18  . EKG 12-Lead    IMPRESSION AND PLAN:  Principal Problem:   Influenza A -Tamiflu ordered, PRN duo nebs and antitussive, other supportive treatment as needed Active Problems:   Diabetes type 2, controlled (HCC) -sliding scale insulin coverage   Coronary artery disease -continue home medications   Pulmonary interstitial fibrosis ([place]) -continue home meds, other treatment as above   HLD (hyperlipidemia) -Home dose antilipid  Chart review performed and case discussed with ED provider. Labs, imaging and/or ECG reviewed by provider and discussed with patient/family. Management plans discussed with the patient and/or family.  DVT PROPHYLAXIS: SubQ lovenox   GI PROPHYLAXIS:  None  ADMISSION STATUS: Inpatient     CODE STATUS: DNR Code Status History    Date Active Date Inactive Code Status Order ID Comments User Context   06/05/2018 1442 06/06/2018 1548  DNR [id number]  Mila Merry, RN Inpatient   06/05/2018 1239 06/05/2018 1441 Full Code [id number]  Algernon Huxley, MD Inpatient   04/27/2018 0152 04/28/2018 1915 DNR [id number]  Harrie Foreman, MD Inpatient   03/17/2018 0941 03/17/2018 1838 DNR [id number]  Demetrios Loll, MD Inpatient   03/15/2018 1208 03/17/2018 0941 Full Code [id number]  Hillary Bow, MD ED    Questions for Most Recent Historical Code Status (Order [id number])    Question Answer Comment   In the event of cardiac or respiratory ARREST Do not call a "code blue"    In the event of cardiac or respiratory ARREST Do not perform Intubation, CPR, defibrillation or ACLS    In the event of cardiac or respiratory ARREST Use medication by any route, position, wound care, and other measures to relive pain and suffering. May use oxygen, suction and manual treatment of airway obstruction as needed for comfort.         Advance Directive Documentation     Most Recent Value  Type of Advance Directive  Out of facility DNR (pink MOST or yellow form)  Pre-existing out of facility DNR order (yellow form or pink MOST form)  Yellow form placed in chart (order not valid for inpatient use)  "MOST" Form in Place?  -      TOTAL TIME TAKING CARE OF THIS PATIENT: 45 minutes.   Jannifer Franklin, Alisyn Lequire [place] 11/24/2018, 12:40 AM  [facility]  Office  [phone]  CC: Primary care physician; Ria Bush, MD  Note:  This document was prepared using Dragon voice recognition software and may include unintentional dictation errors.

## 2018-11-25 LAB — BLOOD CULTURE ID PANEL (REFLEXED)
ACINETOBACTER BAUMANNII: NOT DETECTED
Candida albicans: NOT DETECTED
Candida glabrata: NOT DETECTED
Candida krusei: NOT DETECTED
Candida parapsilosis: NOT DETECTED
Candida tropicalis: NOT DETECTED
Carbapenem resistance: NOT DETECTED
ESCHERICHIA COLI: NOT DETECTED
Enterobacter cloacae complex: NOT DETECTED
Enterobacteriaceae species: NOT DETECTED
Enterococcus species: NOT DETECTED
HAEMOPHILUS INFLUENZAE: NOT DETECTED
Klebsiella oxytoca: NOT DETECTED
Klebsiella pneumoniae: NOT DETECTED
Listeria monocytogenes: NOT DETECTED
METHICILLIN RESISTANCE: NOT DETECTED
Neisseria meningitidis: NOT DETECTED
Proteus species: NOT DETECTED
Pseudomonas aeruginosa: NOT DETECTED
STREPTOCOCCUS SPECIES: NOT DETECTED
Serratia marcescens: NOT DETECTED
Staphylococcus aureus (BCID): NOT DETECTED
Staphylococcus species: DETECTED — AB
Streptococcus agalactiae: NOT DETECTED
Streptococcus pneumoniae: NOT DETECTED
Streptococcus pyogenes: NOT DETECTED
Vancomycin resistance: NOT DETECTED

## 2018-11-25 LAB — GLUCOSE, CAPILLARY
Glucose-Capillary: 105 mg/dL — ABNORMAL HIGH (ref 70–99)
Glucose-Capillary: 162 mg/dL — ABNORMAL HIGH (ref 70–99)
Glucose-Capillary: 97 mg/dL (ref 70–99)
Glucose-Capillary: 117 mg/dL — ABNORMAL HIGH (ref 70–99)

## 2018-11-25 LAB — URINE CULTURE: CULTURE: NO GROWTH

## 2018-11-25 MED ORDER — POLYETHYLENE GLYCOL 3350 17 G PO PACK
17.0000 g | PACK | Freq: Every day | ORAL | Status: DC
  Administered 2018-11-25 – 2018-11-27 (×3): 17 g via ORAL
  Filled 2018-11-25 (×3): qty 1

## 2018-11-25 MED ORDER — ASPIRIN EC 81 MG PO TBEC
81.0000 mg | DELAYED_RELEASE_TABLET | ORAL | Status: DC
  Administered 2018-11-25 – 2018-11-27 (×3): 81 mg via ORAL
  Filled 2018-11-25 (×3): qty 1

## 2018-11-25 NOTE — Plan of Care (Signed)
Discussed with patient and family plan of care for the evening, pain management and receiving his sleeping pill tonight with some teach back displayed

## 2018-11-25 NOTE — Progress Notes (Signed)
[facility] at [place] NAME: Stephen [name]    MR#:  [id number]  DATE OF BIRTH:  1927/07/14  SUBJECTIVE: .  Patient still feels weak, no fever.  CHIEF COMPLAINT:   Chief Complaint  Patient presents with  . Altered Mental Status    REVIEW OF SYSTEMS:   ROS CONSTITUTIONAL: Generalized weakness, l then the dose comes  DRe eYES: No blurred or double vision.  EARS, NOSE, AND THROAT: No tinnitus or ear pain.  RESPIRATORY: No cough, shortness of breath, wheezing or hemoptysis.  CARDIOVASCULAR: No chest pain, orthopnea, edema.  GASTROINTESTINAL: No nausea, vomiting, diarrhea or abdominal pain.  GENITOURINARY: No dysuria, hematuria.  ENDOCRINE: No polyuria, nocturia,  HEMATOLOGY: No anemia, easy bruising or bleeding SKIN: No rash or lesion. MUSCULOSKELETAL: No joint pain or arthritis.   NEUROLOGIC: No tingling, numbness, weakness.  PSYCHIATRY: No anxiety or depression.   DRUG ALLERGIES:   Allergies  Allergen Reactions  . Aricept [Donepezil Hcl] Diarrhea    severe  . Belsomra [Suvorexant] Other (See Comments)    Anxiety. Did not work  . Vancomycin Itching, Rash and Other (See Comments)    Burning pain   . Ambien [Zolpidem Tartrate] Other (See Comments)    Gi problems  . Atorvastatin     muscle aches  . Doxycycline Rash  . Penicillins Hives    Has patient had a PCN reaction causing immediate rash, facial/tongue/throat swelling, SOB or lightheadedness with hypotension: Yes Has patient had a PCN reaction causing severe rash involving mucus membranes or skin necrosis: Yes Has patient had a PCN reaction that required hospitalization No Has patient had a PCN reaction occurring within the last 10 years: No If all of the above answers are "NO", then may proceed with Cephalosporin use.   . Tamsulosin Other (See Comments)    Leg weakness.     VITALS:  Blood pressure 110/66, pulse 80, temperature 98.3 F (36.8 C), temperature  source Oral, resp. rate 19, height 5\' 8"  (1.727 m), weight 88.2 kg, SpO2 97 %.  PHYSICAL EXAMINATION:  GENERAL:  85 y.o.-year-old patient lying in the bed with no acute distress.  EYES: Pupils equal, round, reactive to light and accommodation. No scleral icterus. Extraocular muscles intact.  HEENT: Head atraumatic, normocephalic. Oropharynx and nasopharynx clear.  NECK:  Supple, no jugular venous distention. No thyroid enlargement, no tenderness.  LUNGS: Normal breath sounds bilaterally, no wheezing, rales,rhonchi or crepitation. No use of accessory muscles of respiration.  CARDIOVASCULAR: S1, S2 normal. No murmurs, rubs, or gallops.  ABDOMEN: Soft, nontender, nondistended. Bowel sounds present. No organomegaly or mass.  EXTREMITIES: No pedal edema, cyanosis, or clubbing.  NEUROLOGIC: Cranial nerves II through XII are intact. Muscle strength 5/5 in all extremities. Sensation intact. Gait not checked.  PSYCHIATRIC: The patient is alert and oriented x 3.  SKIN: No obvious rash, lesion, or ulcer.    LABORATORY PANEL:   CBC Recent Labs  Lab 11/24/18 0341  WBC 6.5  HGB 10.4*  HCT 33.9*  PLT 221   ------------------------------------------------------------------------------------------------------------------  Chemistries  Recent Labs  Lab 11/23/18 2110 11/24/18 0341  NA 136 138  K 3.7 3.5  CL 107 109  CO2 22 21*  GLUCOSE 132* 129*  BUN 17 14  CREATININE 1.17 1.14  CALCIUM 8.3* 8.0*  AST 40  --   ALT 20  --   ALKPHOS 47  --   BILITOT 0.5  --    ------------------------------------------------------------------------------------------------------------------  Cardiac Enzymes No results for  input(s): TROPONINI in the last 168 hours. ------------------------------------------------------------------------------------------------------------------  RADIOLOGY:  Dg Chest Port 1 View  Result Date: 11/23/2018 CLINICAL DATA:  Initial evaluation for acute altered mental  status. EXAM: PORTABLE CHEST 1 VIEW COMPARISON:  Prior radiograph from 04/26/2018. FINDINGS: Cardiomegaly.  Mediastinal silhouette within normal limits. Lungs hypoinflated. Perihilar vascular congestion with mild diffuse interstitial prominence, suggesting mild pulmonary interstitial edema. Small right pleural effusion. Associated right basilar opacity could reflect atelectasis or possibly infiltrate. No pneumothorax. No acute osseous finding. IMPRESSION: Mild diffuse pulmonary interstitial congestion/edema with associated small right pleural effusion. Associated right basilar opacity could reflect atelectasis or infiltrate. Electronically Signed   By: Jeannine Boga M.D.   On: 11/23/2018 22:13    EKG:   Orders placed or performed in visit on 11/18/18  . EKG 12-Lead    ASSESSMENT AND PLAN:  Type influenza: Continue Tamiflu, supportive treatment. 2.  Right lower lobe pneumonia, .  Likely coming from flu, discontinue IV antibiotics. 3.  generalized weakness secondary to flu, pneumonia: Continue IV fluids, Tamiflu, physical therapy today 4.  Hyperlipidemia: Continue statins. History of CAD: Continue home medicines 5.  History of Alzheimer's dementia, start back on Klonopin, Namenda. 6.  History of BPH: Patient is on Proscar, Cardura.  #7. history of CVA, patient tolerated aspirin, Plavix.  History of coronary artery disease, carotid artery disease.  Had recent right ICA stent in late 2019.  Followed by vascular surgery Reviewed medical records from [facility] health cardiology..  #8. metabolic encephalopathy secondary to flu, pneumonia, on top of his underlying Alzheimer's dementia /vascular dementia: Patient back to baseline, UA did not show any infection. Physical therapy consult,   All the records are reviewed and case discussed with Care Management/Social Workerr. Management plans discussed with the patient, family and they are in agreement.  CODE STATUS: DNR  TOTAL TIME TAKING CARE OF  THIS PATIENT: 40 minutes.   POSSIBLE D/C IN 1-2 DAYS, DEPENDING ON CLINICAL CONDITION.  More than 50% time spent in counseling, coordination of care, discussed with wife. Epifanio Lesches M.D on 11/25/2018 at 12:52 PM  Between 7am to 6pm - Pager - [phone]  After 6pm go to [email or web address] - password [email or web address] [facility]  Office  [phone]  CC: Primary care physician; Ria Bush, MD   Note: This dictation was prepared with Dragon dictation along with smaller phrase technology. Any transcriptional errors that result from this process are unintentional.

## 2018-11-25 NOTE — Evaluation (Signed)
Physical Therapy Evaluation Patient Details Name: Stephen [name] MRN: [id number] DOB: 1927-03-02 Today's Date: 11/25/2018   History of Present Illness  85 yo male with onset of AMS from metabolic encephalopathy and flu was admitted sepsis, RLL PNA, noted cardiomegaly.  Pt has also pleural effusion, atelectasis and on droplet precautions.  PMHx:  cardiomegaly, CAD, Alz, BPH, CVA, vascular dementia  Clinical Impression  Pt was seen for assessment of mobility with no family at the treatment.  He was able to stand and walk with continual cues, although the actual physical assist was minor.  He is appropriate for discharge to home with HHPT following to increase balance and LE strength/ROM.  Pt lives in a retirement community and will have access to support to continue rehab recovery.    Follow Up Recommendations Home health PT;Supervision for mobility/OOB    Equipment Recommendations  Rolling walker with 5" wheels    Recommendations for Other Services       Precautions / Restrictions Precautions Precautions: Fall(telemetry) Precaution Comments: fall risk from cognition Restrictions Weight Bearing Restrictions: No      Mobility  Bed Mobility               General bed mobility comments: up in chair when PT arrived  Transfers Overall transfer level: Needs assistance Equipment used: Rolling walker (2 wheeled) Transfers: Sit to/from Stand Sit to Stand: Min guard;Min assist         General transfer comment: requires cues for hand placeement 100% of the time.  Ambulation/Gait Ambulation/Gait assistance: Min guard Gait Distance (Feet): 45 Feet Assistive device: Rolling walker (2 wheeled);1 person hand held assist Gait Pattern/deviations: Step-through pattern;Step-to pattern;Drifts right/left;Wide base of support(wide turns) Gait velocity: reduced Gait velocity interpretation: <1.31 ft/sec, indicative of household ambulator    Network engineer Rankin (Stroke Patients Only)       Balance Overall balance assessment: Needs assistance Sitting-balance support: Feet supported Sitting balance-Leahy Scale: Good     Standing balance support: Bilateral upper extremity supported;During functional activity Standing balance-Leahy Scale: Fair                               Pertinent Vitals/Pain Pain Assessment: No/denies pain    Home Living Family/patient expects to be discharged to:: Private residence Living Arrangements: Spouse/significant other Available Help at Discharge: Family Type of Home: Independent living facility Home Access: Level entry     Home Layout: One level Home Equipment: None Additional Comments: Resident of [facility](all history claimed from chart)    Prior Function Level of Independence: Independent               Hand Dominance   Dominant Hand: Right    Extremity/Trunk Assessment   Upper Extremity Assessment Upper Extremity Assessment: Overall WFL for tasks assessed    Lower Extremity Assessment Lower Extremity Assessment: Overall WFL for tasks assessed    Cervical / Trunk Assessment Cervical / Trunk Assessment: Normal  Communication   Communication: Expressive difficulties;Receptive difficulties  Cognition Arousal/Alertness: Awake/alert Behavior During Therapy: Flat affect Overall Cognitive Status: History of cognitive impairments - at baseline                                        General Comments  General comments (skin integrity, edema, etc.): Pt is demonstrating willingness to walk but cannot navigate safely with RW in crowded hosp room    Exercises     Assessment/Plan    PT Assessment Patient needs continued PT services  PT Problem List Decreased range of motion;Decreased activity tolerance;Decreased balance;Decreased mobility;Decreased coordination;Decreased cognition;Decreased knowledge of  use of DME;Decreased safety awareness;Cardiopulmonary status limiting activity       PT Treatment Interventions DME instruction;Gait training;Functional mobility training;Therapeutic activities;Therapeutic exercise;Balance training;Neuromuscular re-education;Patient/family education    PT Goals (Current goals can be found in the Care Plan section)  Acute Rehab PT Goals Patient Stated Goal: none stated PT Goal Formulation: With patient Time For Goal Achievement: 12/09/18 Potential to Achieve Goals: Good    Frequency Min 2X/week   Barriers to discharge Decreased caregiver support(home with just wife in accessible home') sidewalks and doorways to navigate    Co-evaluation               AM-PAC PT "6 Clicks" Mobility  Outcome Measure Help needed turning from your back to your side while in a flat bed without using bedrails?: A Little Help needed moving from lying on your back to sitting on the side of a flat bed without using bedrails?: A Little Help needed moving to and from a bed to a chair (including a wheelchair)?: A Lot Help needed standing up from a chair using your arms (e.g., wheelchair or bedside chair)?: A Little Help needed to walk in hospital room?: A Little Help needed climbing 3-5 steps with a railing? : Total 6 Click Score: 15    End of Session Equipment Utilized During Treatment: Gait belt Activity Tolerance: Patient tolerated treatment well;Patient limited by fatigue Patient left: in chair;with call bell/phone within reach;with chair alarm set Nurse Communication: Mobility status PT Visit Diagnosis: Unsteadiness on feet (R26.81);Difficulty in walking, not elsewhere classified (R26.2);Adult, failure to thrive (R62.7)    Time: 1347-1410 PT Time Calculation (min) (ACUTE ONLY): 23 min   Charges:   PT Evaluation $PT Eval Moderate Complexity: 1 Mod PT Treatments $Gait Training: 8-22 mins       Ramond Dial 11/25/2018, 7:50 PM  Mee Hives, PT MS Acute  Rehab Dept. Number: [place]

## 2018-11-26 LAB — GLUCOSE, CAPILLARY
Glucose-Capillary: 105 mg/dL — ABNORMAL HIGH (ref 70–99)
Glucose-Capillary: 113 mg/dL — ABNORMAL HIGH (ref 70–99)
Glucose-Capillary: 140 mg/dL — ABNORMAL HIGH (ref 70–99)
Glucose-Capillary: 105 mg/dL — ABNORMAL HIGH (ref 70–99)

## 2018-11-26 NOTE — Progress Notes (Signed)
Per Seth Bake admissions coordinator at [facility] patient is an independent living resident at [facility]. Per Seth Bake if patient does not need nursing home health then outpatient PT and OT orders can be faxed to her at [phone]. Per RN case manager patient does not need nursing home health and she will fax outpatient PT and OT orders to [facility].   [employer], LCSW [phone]

## 2018-11-26 NOTE — Progress Notes (Signed)
[place] at [employer] NAME: Stephen [name]    MR#:  [id number]  DATE OF BIRTH:  1927-04-08  SUBJECTIVE: .  Still has cough,here for flu/generalized weakness.  CHIEF COMPLAINT:   Chief Complaint  Patient presents with  . Altered Mental Status    REVIEW OF SYSTEMS:   ROS CONSTITUTIONAL: Generalized weakness,  DRe eYES: No blurred or double vision.  EARS, NOSE, AND THROAT: No tinnitus or ear pain.  RESPIRATORY: has  cough, shortness of breath, wheezing or hemoptysis.  CARDIOVASCULAR: No chest pain, orthopnea, edema.  GASTROINTESTINAL: No nausea, vomiting, diarrhea or abdominal pain.  GENITOURINARY: No dysuria, hematuria.  ENDOCRINE: No polyuria, nocturia,  HEMATOLOGY: No anemia, easy bruising or bleeding SKIN: No rash or lesion. MUSCULOSKELETAL: No joint pain or arthritis.   NEUROLOGIC: No tingling, numbness, weakness.  PSYCHIATRY: No anxiety or depression.   DRUG ALLERGIES:   Allergies  Allergen Reactions  . Aricept [Donepezil Hcl] Diarrhea    severe  . Belsomra [Suvorexant] Other (See Comments)    Anxiety. Did not work  . Vancomycin Itching, Rash and Other (See Comments)    Burning pain   . Ambien [Zolpidem Tartrate] Other (See Comments)    Gi problems  . Atorvastatin     muscle aches  . Doxycycline Rash  . Penicillins Hives    Has patient had a PCN reaction causing immediate rash, facial/tongue/throat swelling, SOB or lightheadedness with hypotension: Yes Has patient had a PCN reaction causing severe rash involving mucus membranes or skin necrosis: Yes Has patient had a PCN reaction that required hospitalization No Has patient had a PCN reaction occurring within the last 10 years: No If all of the above answers are "NO", then may proceed with Cephalosporin use.   . Tamsulosin Other (See Comments)    Leg weakness.     VITALS:  Blood pressure 103/63, pulse 79, temperature 98.1 F (36.7 C), temperature source  Oral, resp. rate 19, height 5\' 8"  (1.727 m), weight 88.2 kg, SpO2 96 %.  PHYSICAL EXAMINATION:  GENERAL:  85 y.o.-year-old patient lying in the bed with no acute distress.  Feels weak EYES: Pupils equal, round, reactive to light and accommodation. No scleral icterus. Extraocular muscles intact.  HEENT: Head atraumatic, normocephalic. Oropharynx and nasopharynx clear.  NECK:  Supple, no jugular venous distention. No thyroid enlargement, no tenderness.  LUNGS: Normal breath sounds bilaterally, no wheezing, rales,rhonchi or crepitation. No use of accessory muscles of respiration.  CARDIOVASCULAR: S1, S2 normal. No murmurs, rubs, or gallops.  ABDOMEN: Soft, nontender, nondistended. Bowel sounds present. No organomegaly or mass.  EXTREMITIES: No pedal edema, cyanosis, or clubbing.  NEUROLOGIC: Cranial nerves II through XII are intact. Muscle strength 5/5 in all extremities. Sensation intact. Gait not checked.  PSYCHIATRIC: The patient is alert and oriented x 3.  SKIN: No obvious rash, lesion, or ulcer.    LABORATORY PANEL:   CBC Recent Labs  Lab 11/24/18 0341  WBC 6.5  HGB 10.4*  HCT 33.9*  PLT 221   ------------------------------------------------------------------------------------------------------------------  Chemistries  Recent Labs  Lab 11/23/18 2110 11/24/18 0341  NA 136 138  K 3.7 3.5  CL 107 109  CO2 22 21*  GLUCOSE 132* 129*  BUN 17 14  CREATININE 1.17 1.14  CALCIUM 8.3* 8.0*  AST 40  --   ALT 20  --   ALKPHOS 47  --   BILITOT 0.5  --    ------------------------------------------------------------------------------------------------------------------  Cardiac Enzymes No results for input(s): TROPONINI  in the last 168 hours. ------------------------------------------------------------------------------------------------------------------  RADIOLOGY:  No results found.  EKG:   Orders placed or performed in visit on 11/18/18  . EKG 12-Lead     ASSESSMENT AND PLAN:  Type A influenza: Continue Tamiflu,for 5 days, supportive treatment.,  Still has some cough, generalized weakness.  Continue supportive care, likely discharge to rehab tomorrow with physical therapy. 2.  Right lower lobe pneumonia, .  Likely coming from flu, discontinue IV antibiotics.coag negative staph in blood,likley contaminant/  3.  generalized weakness secondary to flu, pneumonia: Continue Tamiflu 4.  Hyperlipidemia: Continue statins. History of CAD: Continue home medicines 5.  History of Alzheimer's dementia, start back on Klonopin, Namenda. 6.  History of BPH: Patient is on Proscar, Cardura.  #7. history of CVA, patient tolerated aspirin, Plavix.  History of coronary artery disease, carotid artery disease.  Had recent right ICA stent in late 2019.  Followed by vascular surgery Reviewed medical records from [facility] health cardiology..  #8. metabolic encephalopathy secondary to flu, pneumonia, on top of his underlying Alzheimer's dementia /vascular dementia: Patient back to baseline, UA did not show any infection. Physical therapy recommends home health physical therapy, likely discharge back to [facility].   All the records are reviewed and case discussed with Care Management/Social Workerr. Management plans discussed with the patient, family and they are in agreement.  CODE STATUS: DNR  TOTAL TIME TAKING CARE OF THIS PATIENT: 40 minutes.   POSSIBLE D/C IN 1-2 DAYS, DEPENDING ON CLINICAL CONDITION.  More than 50% time spent in counseling, coordination of care, discussed with wife. Epifanio Lesches M.D on 11/26/2018 at 12:55 PM  Between 7am to 6pm - Pager - [phone]  After 6pm go to [email or web address] - password [email or web address] [place] [facility]  Office  [phone]  CC: Primary care physician; Ria Bush, MD   Note: This dictation was prepared with Dragon dictation along with smaller phrase technology. Any transcriptional  errors that result from this process are unintentional.

## 2018-11-26 NOTE — Progress Notes (Signed)
Physical Therapy Treatment Patient Details Name: Stephen [name] MRN: [id number] DOB: 11-15-1926 Today's Date: 11/26/2018    History of Present Illness 85 yo male with onset of AMS from metabolic encephalopathy and flu was admitted sepsis, RLL PNA, noted cardiomegaly.  Pt has also pleural effusion, atelectasis and on droplet precautions.  PMHx:  cardiomegaly, CAD, Alz, BPH, CVA, vascular dementia    PT Comments    Pt ready for session.  Was able to stand and ambulate x 2 laps in room with seated rest break due to fatigue.  He requires max verbal cues required for hand placements.  He is fatigued with short distances.  Discussed home with pt and wife.  He will need RW upon discharge.  Wide encouraged to have chairs with arms placed along path to bedroom at home as he is unable to walk the full distance to his end goal.  She voiced understanding.      Follow Up Recommendations  Home health PT;Supervision for mobility/OOB     Equipment Recommendations  Rolling walker with 5" wheels    Recommendations for Other Services       Precautions / Restrictions Precautions Precautions: Fall Precaution Comments: fall risk from cognition Restrictions Weight Bearing Restrictions: No    Mobility  Bed Mobility Overal bed mobility: Needs Assistance Bed Mobility: Sit to Supine       Sit to supine: Min guard   General bed mobility comments: up in chair when PT arrived  Transfers Overall transfer level: Needs assistance Equipment used: Rolling walker (2 wheeled) Transfers: Sit to/from Stand Sit to Stand: Min guard;Min assist         General transfer comment: requires cues for hand placeement 100% of the time.  Ambulation/Gait Ambulation/Gait assistance: Min guard Gait Distance (Feet): 45 Feet Assistive device: Rolling walker (2 wheeled) Gait Pattern/deviations: Step-through pattern;Decreased step length - right;Decreased step length - left Gait velocity: reduced Gait velocity  interpretation: <1.31 ft/sec, indicative of household ambulator General Gait Details: generally steady but requires +1 for safety and cues   Stairs             Wheelchair Mobility    Modified Rankin (Stroke Patients Only)       Balance Overall balance assessment: Needs assistance Sitting-balance support: Feet supported Sitting balance-Leahy Scale: Good     Standing balance support: Bilateral upper extremity supported Standing balance-Leahy Scale: Fair                              Cognition Arousal/Alertness: Awake/alert Behavior During Therapy: WFL for tasks assessed/performed Overall Cognitive Status: History of cognitive impairments - at baseline                                        Exercises      General Comments        Pertinent Vitals/Pain Pain Assessment: No/denies pain    Home Living                      Prior Function            PT Goals (current goals can now be found in the care plan section) Progress towards PT goals: Progressing toward goals    Frequency    Min 2X/week      PT Plan Current plan remains appropriate  Co-evaluation              AM-PAC PT "6 Clicks" Mobility   Outcome Measure  Help needed turning from your back to your side while in a flat bed without using bedrails?: A Little Help needed moving from lying on your back to sitting on the side of a flat bed without using bedrails?: A Little Help needed moving to and from a bed to a chair (including a wheelchair)?: A Little Help needed standing up from a chair using your arms (e.g., wheelchair or bedside chair)?: A Little Help needed to walk in hospital room?: A Little Help needed climbing 3-5 steps with a railing? : A Lot 6 Click Score: 17    End of Session Equipment Utilized During Treatment: Gait belt Activity Tolerance: Patient tolerated treatment well;Patient limited by fatigue Patient left: in chair;with call  bell/phone within reach;with chair alarm set         Time: 1010-1029 PT Time Calculation (min) (ACUTE ONLY): 19 min  Charges:  $Gait Training: 8-22 mins                    Chesley Noon, PTA 11/26/18, 11:35 AM

## 2018-11-27 LAB — CULTURE, BLOOD (ROUTINE X 2)

## 2018-11-27 LAB — GLUCOSE, CAPILLARY: Glucose-Capillary: 97 mg/dL (ref 70–99)

## 2018-11-27 MED ORDER — OSELTAMIVIR PHOSPHATE 30 MG PO CAPS: 30.0000 mg | ORAL_CAPSULE | Freq: Two times a day (BID) | ORAL | 0 refills | Status: AC

## 2018-11-27 NOTE — Progress Notes (Signed)
Discharge instructions given to pt. IVs removed. Pt dressed and will discharge back to [place] [facility] with wife.

## 2018-11-27 NOTE — Discharge Summary (Signed)
[place] [place] NAME: Stephen [name]    MR#:  [id number]  DATE OF BIRTH:  Sep 19, 1927  DATE OF ADMISSION:  11/23/2018 ADMITTING PHYSICIAN: Lance Coon, MD  DATE OF DISCHARGE: 11/27/2017  PRIMARY CARE PHYSICIAN: Ria Bush, MD   ADMISSION DIAGNOSIS:  Influenza A [J10.1] Acute encephalopathy [G93.40] Sepsis, due to unspecified organism, unspecified whether acute organ dysfunction present ([place]) [A41.9]  DISCHARGE DIAGNOSIS:  Principal Problem:   Influenza A Active Problems:   Diabetes type 2, controlled ([place])   HLD (hyperlipidemia)   Coronary artery disease   Pulmonary interstitial fibrosis ([place]) Metabolic encephalopathy  SECONDARY DIAGNOSIS:   Past Medical History:  Diagnosis Date  . Arthritis of knee   . BPH (benign prostatic hypertrophy)    w/ nocturia  . Coronary artery disease 03/2014   Inferior ST elevation myocardial infarction. Cardiac catheterization showed an occluded mid RCA. He had an angioplasty and drug-eluting stent placement with a 3.0 x 16 mm Promus drug-eluting stent. Ejection fraction was 45% by echo, completed cardiac rehab 06/2014  . CVA (cerebral vascular accident) ([place]) 03/2018  . Dementia ([place])    s/p stroke  . Diabetes type 2, controlled ([place])   . Heart murmur    keeping an eye on it but not treating  . History of basal cell cancer    s/p mohs  . History of colon polyps   . History of hiatal hernia 05/2018   this causes laryngeal spasms. pt was taking nitro for this when bp drops and he ends up with tia symptoms  . History of hypertension   . HLD (hyperlipidemia)    diet controlled in past  . Hypertension   . Insomnia    treated with multiple meds in past  . MI (myocardial infarction) ([place]) 2015  . Rosacea   . Skin cancer   . Sleep apnea    has cpap but does not use properly and does not like it!!  . Stroke ([place]) 03/2018   per cat scan, patient has had several strokes  . TIA  (transient ischemic attack)    x 2 since CVA 03-2018  . UTI (urinary tract infection)      ADMITTING HISTORY Stephen [name]  is a 85 y.o. male who presents with chief complaint as above.  Patient presents to the ED with confusion, and cough.  Here in the ED he was found to meet sirs criteria, and treated initially with antibiotics.  However, patient was then found to be influenza A positive.  His chest x-ray shows some chronic interstitial changes, and is an indeterminate read for possible pneumonia.  However, his procalcitonin was less then 0.1 and his white blood cell count was within normal limits.  He was treated with Tamiflu, and hospitalist were called for admission  HOSPITAL COURSE:  Was admitted to medical floor.  Flu test was positive and patient started on oral Tamiflu.  Patient received nebulization treatments and antitussive medication.  Blood cultures grew Staphylococcus coagulase-negative which is a contaminant.  Urine culture did not reveal any growth.  Mental status improved and back to baseline.  Metabolic encephalopathy resolved.  Shortness of breath and cough also improved.  Patient tolerated diet well.  Patient will be discharged back to [facility] independent living facility with outpatient physical therapy and occupational therapy services.  CONSULTS OBTAINED:    DRUG ALLERGIES:   Allergies  Allergen Reactions  . Aricept [Donepezil Hcl] Diarrhea    severe  .  Belsomra [Suvorexant] Other (See Comments)    Anxiety. Did not work  . Vancomycin Itching, Rash and Other (See Comments)    Burning pain   . Ambien [Zolpidem Tartrate] Other (See Comments)    Gi problems  . Atorvastatin     muscle aches  . Doxycycline Rash  . Penicillins Hives    Has patient had a PCN reaction causing immediate rash, facial/tongue/throat swelling, SOB or lightheadedness with hypotension: Yes Has patient had a PCN reaction causing severe rash involving mucus membranes or skin necrosis: Yes Has  patient had a PCN reaction that required hospitalization No Has patient had a PCN reaction occurring within the last 10 years: No If all of the above answers are "NO", then may proceed with Cephalosporin use.   . Tamsulosin Other (See Comments)    Leg weakness.     DISCHARGE MEDICATIONS:   Allergies as of 11/27/2018      Reactions   Aricept [donepezil Hcl] Diarrhea   severe   Belsomra [suvorexant] Other (See Comments)   Anxiety. Did not work   Vancomycin Itching, Rash, Other (See Comments)   Burning pain    Ambien [zolpidem Tartrate] Other (See Comments)   Gi problems   Atorvastatin    muscle aches   Doxycycline Rash   Penicillins Hives   Has patient had a PCN reaction causing immediate rash, facial/tongue/throat swelling, SOB or lightheadedness with hypotension: Yes Has patient had a PCN reaction causing severe rash involving mucus membranes or skin necrosis: Yes Has patient had a PCN reaction that required hospitalization No Has patient had a PCN reaction occurring within the last 10 years: No If all of the above answers are "NO", then may proceed with Cephalosporin use.   Tamsulosin Other (See Comments)   Leg weakness.       Medication List    TAKE these medications   acetaminophen 500 MG tablet Commonly known as:  TYLENOL Take 1 tablet (500 mg total) by mouth 2 (two) times daily.   aspirin EC 81 MG tablet Take 81 mg by mouth every morning.   clonazePAM 1 MG tablet Commonly known as:  KLONOPIN Take 1 tablet (1 mg total) by mouth at bedtime. What changed:  how much to take   clopidogrel 75 MG tablet Commonly known as:  PLAVIX TAKE 1 TABLET (75 MG TOTAL) BY MOUTH AT BEDTIME.   diclofenac sodium 1 % Gel Commonly known as:  VOLTAREN Apply 2 g topically 3 (three) times daily.   doxazosin 1 MG tablet Commonly known as:  CARDURA TAKE 1 TABLET BY MOUTH EVERY DAY   finasteride 5 MG tablet Commonly known as:  PROSCAR TAKE 1 TABLET BY MOUTH EVERY DAY   memantine  5 MG tablet Commonly known as:  NAMENDA Take 5 mg by mouth 2 (two) times daily.   metFORMIN 500 MG tablet Commonly known as:  GLUCOPHAGE TAKE 1 TABLET (500 MG TOTAL) BY MOUTH 2 (TWO) TIMES DAILY WITH A MEAL.   multivitamin with minerals Tabs tablet Take 1 tablet by mouth daily.   oseltamivir 30 MG capsule Commonly known as:  TAMIFLU Take 1 capsule (30 mg total) by mouth 2 (two) times daily for 3 days.   pantoprazole 40 MG tablet Commonly known as:  PROTONIX Take 1 tablet (40 mg total) by mouth 2 (two) times daily.   pravastatin 40 MG tablet Commonly known as:  PRAVACHOL TAKE 1 TABLET (40 MG TOTAL) BY MOUTH DAILY.   vitamin B-12 500 MCG tablet Commonly known  as:  CYANOCOBALAMIN Take 500 mcg by mouth daily.       Today  Patient seen and evaluated on the day of discharge No shortness of breath and cough Tolerating diet well Hemodynamically stable Will be discharged today  VITAL SIGNS:  Blood pressure 123/68, pulse 77, temperature 99 F (37.2 C), temperature source Oral, resp. rate (!) 21, height 5\' 8"  (1.727 m), weight 88.2 kg, SpO2 95 %.  I/O:    Intake/Output Summary (Last 24 hours) at 11/27/2018 0925 Last data filed at 11/27/2018 2706 Gross per 24 hour  Intake -  Output 1250 ml  Net -1250 ml    PHYSICAL EXAMINATION:  Physical Exam  GENERAL:  85 y.o.-year-old patient lying in the bed with no acute distress.  LUNGS: Normal breath sounds bilaterally, no wheezing, rales,rhonchi or crepitation. No use of accessory muscles of respiration.  CARDIOVASCULAR: S1, S2 normal. No murmurs, rubs, or gallops.  ABDOMEN: Soft, non-tender, non-distended. Bowel sounds present. No organomegaly or mass.  NEUROLOGIC: Moves all 4 extremities. PSYCHIATRIC: The patient is alert and oriented x 3.  SKIN: No obvious rash, lesion, or ulcer.   DATA REVIEW:   CBC Recent Labs  Lab 11/24/18 0341  WBC 6.5  HGB 10.4*  HCT 33.9*  PLT 221    Chemistries  Recent Labs  Lab  11/23/18 2110 11/24/18 0341  NA 136 138  K 3.7 3.5  CL 107 109  CO2 22 21*  GLUCOSE 132* 129*  BUN 17 14  CREATININE 1.17 1.14  CALCIUM 8.3* 8.0*  AST 40  --   ALT 20  --   ALKPHOS 47  --   BILITOT 0.5  --     Cardiac Enzymes No results for input(s): TROPONINI in the last 168 hours.  Microbiology Results  Results for orders placed or performed during the hospital encounter of 11/23/18  Blood culture (routine x 2)     Status: Abnormal   Collection Time: 11/23/18  9:10 PM  Result Value Ref Range Status   Specimen Description   Final    BLOOD RIGHT FOREARM Performed at [facility], [address], [place], [place] [address]    Special Requests   Final    BOTTLES DRAWN AEROBIC AND ANAEROBIC Blood Culture results may not be optimal due to an excessive volume of blood received in culture bottles Performed at [facility], [address] [place], [place] [address]    Culture  Setup Time   Final    GRAM POSITIVE COCCI IN CLUSTERS AEROBIC BOTTLE ONLY CRITICAL RESULT CALLED TO, READ BACK BY AND VERIFIED WITH: CHRIS NESBITT AT 2008 11/24/2018 KMP    Culture (A)  Final    STAPHYLOCOCCUS SPECIES (COAGULASE NEGATIVE) THE SIGNIFICANCE OF ISOLATING THIS ORGANISM FROM A SINGLE SET OF BLOOD CULTURES WHEN MULTIPLE SETS ARE DRAWN IS UNCERTAIN. PLEASE NOTIFY THE MICROBIOLOGY DEPARTMENT WITHIN ONE WEEK IF SPECIATION AND SENSITIVITIES ARE REQUIRED. Performed at [facility], [place] [address] [place], [place] [address]    Report Status 11/27/2018 FINAL  Final  Blood culture (routine x 2)     Status: None (Preliminary result)   Collection Time: 11/23/18  9:10 PM  Result Value Ref Range Status   Specimen Description BLOOD LEFT ANTECUBITAL  Final   Special Requests   Final    BOTTLES DRAWN AEROBIC AND ANAEROBIC Blood Culture adequate volume   Culture   Final    NO GROWTH 4 DAYS Performed at [facility], [place],  [place], [place] [address]     Report Status PENDING  Incomplete  Blood Culture ID Panel (Reflexed)     Status: Abnormal   Collection Time: 11/23/18  9:10 PM  Result Value Ref Range Status   Enterococcus species NOT DETECTED NOT DETECTED Final   Vancomycin resistance NOT DETECTED NOT DETECTED Final   Listeria monocytogenes NOT DETECTED NOT DETECTED Final   Staphylococcus species DETECTED (A) NOT DETECTED Final    Comment: Methicillin (oxacillin) susceptible coagulase negative staphylococcus. Possible blood culture contaminant (unless isolated from more than one blood culture draw or clinical case suggests pathogenicity). No antibiotic treatment is indicated for blood  culture contaminants. CRITICAL RESULT CALLED TO, READ BACK BY AND VERIFIED WITH: CHRIS NESBITT AT 2008 11/24/2018 KMP    Staphylococcus aureus (BCID) NOT DETECTED NOT DETECTED Final   Methicillin resistance NOT DETECTED NOT DETECTED Final   Streptococcus species NOT DETECTED NOT DETECTED Final   Streptococcus agalactiae NOT DETECTED NOT DETECTED Final   Streptococcus pneumoniae NOT DETECTED NOT DETECTED Final   Streptococcus pyogenes NOT DETECTED NOT DETECTED Final   Acinetobacter baumannii NOT DETECTED NOT DETECTED Final   Enterobacteriaceae species NOT DETECTED NOT DETECTED Final   Enterobacter cloacae complex NOT DETECTED NOT DETECTED Final   Escherichia coli NOT DETECTED NOT DETECTED Final   Klebsiella oxytoca NOT DETECTED NOT DETECTED Final   Klebsiella pneumoniae NOT DETECTED NOT DETECTED Final   Proteus species NOT DETECTED NOT DETECTED Final   Serratia marcescens NOT DETECTED NOT DETECTED Final   Carbapenem resistance NOT DETECTED NOT DETECTED Final   Haemophilus influenzae NOT DETECTED NOT DETECTED Final   Neisseria meningitidis NOT DETECTED NOT DETECTED Final   Pseudomonas aeruginosa NOT DETECTED NOT DETECTED Final   Candida albicans NOT DETECTED NOT DETECTED Final   Candida glabrata NOT DETECTED NOT DETECTED Final   Candida krusei NOT  DETECTED NOT DETECTED Final   Candida parapsilosis NOT DETECTED NOT DETECTED Final   Candida tropicalis NOT DETECTED NOT DETECTED Final    Comment: Performed at [facility], [address] [place], [place] [address]  Urine culture     Status: None   Collection Time: 11/23/18  9:23 PM  Result Value Ref Range Status   Specimen Description   Final    URINE, RANDOM Performed at [facility], [address] [place], [place] [address]    Special Requests   Final    NONE Performed at [facility], [address], [place], [place] [address]    Culture   Final    NO GROWTH Performed at [facility], [address] [place], [place] [address]    Report Status 11/25/2018 FINAL  Final    RADIOLOGY:  No results found.  Follow up with PCP in 1 week.  Management plans discussed with the patient, family and they are in agreement.  CODE STATUS: DNR    Code Status Orders  (From admission, onward)         Start     Ordered   11/24/18 0122  Do not attempt resuscitation (DNR)  Continuous    Question Answer Comment  In the event of cardiac or respiratory ARREST Do not call a "code blue"   In the event of cardiac or respiratory ARREST Do not perform Intubation, CPR, defibrillation or ACLS   In the event of cardiac or respiratory ARREST Use medication by any route, position, wound care, and other measures to relive pain and suffering. May use oxygen, suction and manual  treatment of airway obstruction as needed for comfort.      11/24/18 0121        Code Status History    Date Active Date Inactive Code Status Order ID Comments User Context   06/05/2018 1442 06/06/2018 1548 DNR [id number]  Mila Merry, RN Inpatient   06/05/2018 1239 06/05/2018 1441 Full Code [id number]  Algernon Huxley, MD Inpatient   04/27/2018 0152 04/28/2018 1915 DNR [id number]  Harrie Foreman, MD Inpatient   03/17/2018 0941 03/17/2018 1838 DNR [id number]  Demetrios Loll, MD Inpatient   03/15/2018  1208 03/17/2018 0941 Full Code [id number]  Hillary Bow, MD ED    Advance Directive Documentation     Most Recent Value  Type of Advance Directive  Out of facility DNR (pink MOST or yellow form)  Pre-existing out of facility DNR order (yellow form or pink MOST form)  Yellow form placed in chart (order not valid for inpatient use)  "MOST" Form in Place?  -      TOTAL TIME TAKING CARE OF THIS PATIENT ON DAY OF DISCHARGE: more than 34 minutes.   Saundra Shelling M.D on 11/27/2018 at 9:25 AM  Between 7am to 6pm - Pager - [phone]  After 6pm go to [email or web address] - password [email or web address] [facility]  Office  [phone]  CC: Primary care physician; Ria Bush, MD  Note: This dictation was prepared with Dragon dictation along with smaller phrase technology. Any transcriptional errors that result from this process are unintentional.

## 2018-11-28 ENCOUNTER — Telehealth: Payer: Self-pay

## 2018-11-28 LAB — CULTURE, BLOOD (ROUTINE X 2)
Culture: NO GROWTH
Special Requests: ADEQUATE

## 2018-11-28 NOTE — Telephone Encounter (Signed)
Transition Care Management Follow-up Telephone Call Spoke with patient's wife-ok per DPR.   Date discharged? 11/27/2018   How have you been since you were released from the hospital? He is really weak and his dementia is worse.   Do you understand why you were in the hospital? Yes-his wife does.   Do you understand the discharge instructions? yes   Where were you discharged to? Home-they live at [facility].   Items Reviewed:  Medications reviewed: yes  Allergies reviewed: yes  Dietary changes reviewed: yes  Referrals reviewed: yes-follow up with cardiologist and PCP.   Functional Questionnaire:   Activities of Daily Living (ADLs):   He states they are independent in the following: none at this time. States they require assistance with the following: ambulation, dressing, feeding, bathing, toileting, hygiene.   Any transportation issues/concerns?: no   Any patient concerns? Not at this time-will discuss appointment that is scheduled to see cardiologist on 12/17/2018.   Confirmed importance and date/time of follow-up visits scheduled yes  Provider Appointment booked with Dr. Danise Mina on 12/02/2018.  Confirmed with patient if condition begins to worsen call PCP or go to the ER.  Patient was given the office number and encouraged to call back with question or concerns.  : yes

## 2018-12-02 ENCOUNTER — Ambulatory Visit (INDEPENDENT_AMBULATORY_CARE_PROVIDER_SITE_OTHER): Payer: Medicare Other | Admitting: Family Medicine

## 2018-12-02 ENCOUNTER — Encounter: Payer: Self-pay | Admitting: Family Medicine

## 2018-12-02 ENCOUNTER — Ambulatory Visit (INDEPENDENT_AMBULATORY_CARE_PROVIDER_SITE_OTHER)
Admission: RE | Admit: 2018-12-02 | Discharge: 2018-12-02 | Disposition: A | Discharge status: Home / Self Care | Payer: Medicare Other | Source: Ambulatory Visit | Attending: Family Medicine | Admitting: Family Medicine

## 2018-12-02 VITALS — BP 116/62 | HR 69 | Temp 97.8°F | Ht 67.0 in | Wt 184.5 lb

## 2018-12-02 DIAGNOSIS — R059 Cough, unspecified: Secondary | ICD-10-CM

## 2018-12-02 DIAGNOSIS — R05 Cough: Secondary | ICD-10-CM | POA: Diagnosis not present

## 2018-12-02 DIAGNOSIS — F0150 Vascular dementia without behavioral disturbance: Secondary | ICD-10-CM | POA: Diagnosis not present

## 2018-12-02 DIAGNOSIS — J101 Influenza due to other identified influenza virus with other respiratory manifestations: Secondary | ICD-10-CM | POA: Diagnosis not present

## 2018-12-02 DIAGNOSIS — M6281 Muscle weakness (generalized): Secondary | ICD-10-CM | POA: Diagnosis not present

## 2018-12-02 MED ORDER — MEMANTINE HCL 10 MG PO TABS: 10.0000 mg | ORAL_TABLET | Freq: Two times a day (BID) | ORAL | 3 refills | Status: DC

## 2018-12-02 NOTE — Progress Notes (Signed)
BP 116/62 (BP Location: Left Arm, Patient Position: Sitting, Cuff Size: Normal)   Pulse 69   Temp 97.8 F (36.6 C) (Oral)   Ht 5\' 7"  (1.702 m)   Wt 184 lb 8 oz (83.7 kg)   SpO2 97%   BMI 28.90 kg/m    CC: hospital f/u visit Subjective:    Patient ID: Stephen [name], male    DOB: [birth year], 85 y.o.   MRN: [id number]  HPI: [name] is a 85 y.o. male presenting on 12/02/2018 for Hospitalization Follow-up (Pt accompanied by his significant other, [name]. )   Recent hospitalization for cough with confusion, SIRS. Empiric antibiotics were started, he tested positive for influenza A. Treated with tamiflu. Received nebulized teratment. CXR at that time - R basilar opacity ?PNA. Blcx CONS - thought contaminant. UCx no growth. Discharged with planned outpatient PT/OT [facility]). Home care aide coming out to the house as well.   Since home, SO reports ongoing weakness as well as noticeably worsened mental cognition. Less motivation to do things, decreased appetite, increased sleep. Denies fevers/chills, any pain. Sleeping well at night time. Wants to stay in his recliner all day. Taking klonopin 1/2 tab nightly and [name] thinks this is very beneficial, hesitant to change dose. Denies unilateral weakness, paresthesias or numbness, slurred speech.  Continues namenda 5mg  bid.   DATE OF ADMISSION:  11/23/2018 DATE OF DISCHARGE: 11/27/2017 TCM hosp f/u phone call completed 11/28/2017  Discharge diagnosis: Principal Problem:   Influenza A Active Problems:   Diabetes type 2, controlled ([place])   HLD (hyperlipidemia)   Coronary artery disease   Pulmonary interstitial fibrosis ([facility]) Metabolic encephalopathy     Relevant past medical, surgical, family and social history reviewed and updated as indicated. Interim medical history since our last visit reviewed. Allergies and medications reviewed and updated. Outpatient Medications Prior to Visit  Medication Sig Dispense Refill  .  acetaminophen (TYLENOL) 500 MG tablet Take 1 tablet (500 mg total) by mouth 2 (two) times daily. 30 tablet 0  . aspirin EC 81 MG tablet Take 81 mg by mouth every morning.    . clonazePAM (KLONOPIN) 1 MG tablet Take 1 tablet (1 mg total) by mouth at bedtime. (Patient taking differently: Take 0.5 mg by mouth at bedtime. ) 30 tablet 3  . clopidogrel (PLAVIX) 75 MG tablet TAKE 1 TABLET (75 MG TOTAL) BY MOUTH AT BEDTIME. 90 tablet 1  . diclofenac sodium (VOLTAREN) 1 % GEL Apply 2 g topically 3 (three) times daily.   1  . doxazosin (CARDURA) 1 MG tablet TAKE 1 TABLET BY MOUTH EVERY DAY 90 tablet 0  . finasteride (PROSCAR) 5 MG tablet TAKE 1 TABLET BY MOUTH EVERY DAY 90 tablet 1  . metFORMIN (GLUCOPHAGE) 500 MG tablet TAKE 1 TABLET (500 MG TOTAL) BY MOUTH 2 (TWO) TIMES DAILY WITH A MEAL. 180 tablet 1  . Multiple Vitamin (MULTIVITAMIN WITH MINERALS) TABS tablet Take 1 tablet by mouth daily.    . pantoprazole (PROTONIX) 40 MG tablet Take 1 tablet (40 mg total) by mouth 2 (two) times daily. 180 tablet 1  . pravastatin (PRAVACHOL) 40 MG tablet TAKE 1 TABLET (40 MG TOTAL) BY MOUTH DAILY. 90 tablet 3  . vitamin B-12 (CYANOCOBALAMIN) 500 MCG tablet Take 500 mcg by mouth daily.    . memantine (NAMENDA) 5 MG tablet Take 5 mg by mouth 2 (two) times daily.     No facility-administered medications prior to visit.      Per HPI  unless specifically indicated in ROS section below Review of Systems Objective:    BP 116/62 (BP Location: Left Arm, Patient Position: Sitting, Cuff Size: Normal)   Pulse 69   Temp 97.8 F (36.6 C) (Oral)   Ht 5\' 7"  (1.702 m)   Wt 184 lb 8 oz (83.7 kg)   SpO2 97%   BMI 28.90 kg/m   Wt Readings from Last 3 Encounters:  12/02/18 184 lb 8 oz (83.7 kg)  11/23/18 194 lb 8 oz (88.2 kg)  11/18/18 194 lb 8 oz (88.2 kg)    Physical Exam Vitals signs and nursing note reviewed.  Constitutional:      Appearance: Normal appearance. He is not ill-appearing.     Comments: Sitting in  wheelchair  Cardiovascular:     Rate and Rhythm: Normal rate and regular rhythm.     Pulses: Normal pulses.     Heart sounds: Normal heart sounds. No murmur.  Pulmonary:     Effort: Pulmonary effort is normal. No respiratory distress.     Breath sounds: Decreased breath sounds (left lung base) and rales (LLL) present. No wheezing or rhonchi.  Musculoskeletal:     Right lower leg: No edema.     Left lower leg: No edema.  Neurological:     Mental Status: He is alert.     Comments: Oriented to person. Knows my name. Does not know month or year.   Psychiatric:        Mood and Affect: Mood normal.        Behavior: Behavior normal.       Lab Results  Component Value Date   HGBA1C 6.2 (A) 06/26/2018    DG Chest 2 View CLINICAL DATA:  Left lower lobe rales after influenza. Cough. Former smoker.  EXAM: CHEST - 2 VIEW  COMPARISON:  11/23/2018  FINDINGS: The cardiomediastinal silhouette is unchanged with normal heart size. Thoracic aortic tortuosity and atherosclerotic calcification are noted. Chronic retrocardiac opacity in the medial left lung base corresponds to a previously demonstrated hiatal hernia.  The patient has taken a greater inspiration than on the prior study. Interstitial densities in both lungs have improved though not completely resolved with residual densities most notable in the left lower lobe and right midlung. There is increased opacity which projects over the midthoracic spine on the lateral radiograph which may correspond to the right midlung density on the PA radiograph and potentially be within the superior segment of the right lower lobe. No pleural effusion or pneumothorax is identified. Degenerative changes are noted at the shoulders.  IMPRESSION: Overall improved appearance of the lungs with decreased though not completely resolved interstitial densities bilaterally. More confluent posterior lung opacity, possibly in the superior segment of the  right lower lobe, may reflect pneumonia.  Electronically Signed   By: Logan Bores M.D.   On: 12/03/2018 08:42   Assessment & Plan:   Problem List Items Addressed This Visit    Vascular dementia [facility])    Concern for progression after recent hospitalization with sepsis from influenza. I did suggest increase in namenda to 10mg  bid. Has previously not tolerated aricept.       Relevant Medications   memantine (NAMENDA) 10 MG tablet   Influenza A    Recovering from recent influenzal illness and cough seems to be resolving, but [name] notes persistent generalized weakness. ?new L sided rales - update CXR r/o L sided PNA.        Other Visit Diagnoses    Cough    -  Primary   Relevant Orders   DG Chest 2 View (Completed)       Meds ordered this encounter  Medications  . memantine (NAMENDA) 10 MG tablet    Sig: Take 1 tablet (10 mg total) by mouth 2 (two) times daily.    Dispense:  60 tablet    Refill:  3   Orders Placed This Encounter  Procedures  . DG Chest 2 View    Standing Status:   Future    Number of Occurrences:   1    Standing Expiration Date:   01/31/2020    Order Specific Question:   Reason for Exam (SYMPTOM  OR DIAGNOSIS REQUIRED)    Answer:   LLL rales after influenza    Order Specific Question:   Preferred imaging location?    Answer:   [facility]    Order Specific Question:   Radiology Contrast Protocol - do NOT remove file path    Answer:   [email or web address]    Follow up plan: No follow-ups on file.  Ria Bush, MD

## 2018-12-02 NOTE — Patient Instructions (Addendum)
Chest xray today Ok to cancel cardiology appointment Increase namenda to 10mg  twice daily.

## 2018-12-03 ENCOUNTER — Inpatient Hospital Stay: Payer: Medicare Other | Admitting: Family Medicine

## 2018-12-03 NOTE — Assessment & Plan Note (Signed)
Recovering from recent influenzal illness and cough seems to be resolving, but Stephen [name] notes persistent generalized weakness. ?new L sided rales - update CXR r/o L sided PNA.

## 2018-12-03 NOTE — Assessment & Plan Note (Signed)
Concern for progression after recent hospitalization with sepsis from influenza. I did suggest increase in namenda to 10mg  bid. Has previously not tolerated aricept.

## 2018-12-04 DIAGNOSIS — M6281 Muscle weakness (generalized): Secondary | ICD-10-CM | POA: Diagnosis not present

## 2018-12-05 ENCOUNTER — Telehealth: Payer: Self-pay

## 2018-12-05 MED ORDER — DOXAZOSIN MESYLATE 1 MG PO TABS: 1.0000 mg | ORAL_TABLET | Freq: Every day | ORAL | 0 refills | Status: DC

## 2018-12-05 NOTE — Telephone Encounter (Signed)
E-scribed refill 

## 2018-12-09 DIAGNOSIS — M6281 Muscle weakness (generalized): Secondary | ICD-10-CM | POA: Diagnosis not present

## 2018-12-11 DIAGNOSIS — M6281 Muscle weakness (generalized): Secondary | ICD-10-CM | POA: Diagnosis not present

## 2018-12-16 DIAGNOSIS — M6281 Muscle weakness (generalized): Secondary | ICD-10-CM | POA: Diagnosis not present

## 2018-12-17 ENCOUNTER — Ambulatory Visit: Payer: Medicare Other | Admitting: Physician Assistant

## 2018-12-18 DIAGNOSIS — M6281 Muscle weakness (generalized): Secondary | ICD-10-CM | POA: Diagnosis not present

## 2018-12-23 DIAGNOSIS — M6281 Muscle weakness (generalized): Secondary | ICD-10-CM | POA: Diagnosis not present

## 2018-12-25 DIAGNOSIS — M6281 Muscle weakness (generalized): Secondary | ICD-10-CM | POA: Diagnosis not present

## 2018-12-26 ENCOUNTER — Other Ambulatory Visit: Payer: Self-pay | Admitting: Family Medicine

## 2018-12-30 DIAGNOSIS — M6281 Muscle weakness (generalized): Secondary | ICD-10-CM | POA: Diagnosis not present

## 2019-01-01 DIAGNOSIS — M6281 Muscle weakness (generalized): Secondary | ICD-10-CM | POA: Diagnosis not present

## 2019-01-02 ENCOUNTER — Ambulatory Visit: Payer: Medicare Other

## 2019-01-06 DIAGNOSIS — M6281 Muscle weakness (generalized): Secondary | ICD-10-CM | POA: Diagnosis not present

## 2019-01-08 DIAGNOSIS — M6281 Muscle weakness (generalized): Secondary | ICD-10-CM | POA: Diagnosis not present

## 2019-01-09 ENCOUNTER — Ambulatory Visit: Payer: Medicare Other | Admitting: Family Medicine

## 2019-01-09 ENCOUNTER — Other Ambulatory Visit: Payer: Self-pay | Admitting: Family Medicine

## 2019-01-09 NOTE — Telephone Encounter (Signed)
Eprescribed.

## 2019-01-09 NOTE — Telephone Encounter (Signed)
Name of Medication: Clonazepam Name of Pharmacy: [facility] or Written Date and Quantity: 12/02/18, #30 Last Office Visit and Type: 12/02/18, acute Next Office Visit and Type: 01/27/19, CPE Pt 2 Last Controlled Substance Agreement Date: none Last UDS: none

## 2019-01-13 DIAGNOSIS — M6281 Muscle weakness (generalized): Secondary | ICD-10-CM | POA: Diagnosis not present

## 2019-01-15 DIAGNOSIS — M6281 Muscle weakness (generalized): Secondary | ICD-10-CM | POA: Diagnosis not present

## 2019-01-18 ENCOUNTER — Other Ambulatory Visit: Payer: Self-pay | Admitting: Family Medicine

## 2019-01-20 DIAGNOSIS — M6281 Muscle weakness (generalized): Secondary | ICD-10-CM | POA: Diagnosis not present

## 2019-01-21 ENCOUNTER — Other Ambulatory Visit: Payer: Self-pay | Admitting: Family Medicine

## 2019-01-21 DIAGNOSIS — E1122 Type 2 diabetes mellitus with diabetic chronic kidney disease: Secondary | ICD-10-CM

## 2019-01-21 DIAGNOSIS — D649 Anemia, unspecified: Secondary | ICD-10-CM

## 2019-01-21 DIAGNOSIS — E7800 Pure hypercholesterolemia, unspecified: Secondary | ICD-10-CM

## 2019-01-21 DIAGNOSIS — N181 Chronic kidney disease, stage 1: Principal | ICD-10-CM

## 2019-01-22 ENCOUNTER — Other Ambulatory Visit (INDEPENDENT_AMBULATORY_CARE_PROVIDER_SITE_OTHER): Payer: Medicare Other

## 2019-01-22 ENCOUNTER — Other Ambulatory Visit: Payer: Self-pay

## 2019-01-22 ENCOUNTER — Ambulatory Visit: Payer: Medicare Other

## 2019-01-22 DIAGNOSIS — D649 Anemia, unspecified: Secondary | ICD-10-CM

## 2019-01-22 DIAGNOSIS — E7800 Pure hypercholesterolemia, unspecified: Secondary | ICD-10-CM

## 2019-01-22 DIAGNOSIS — N181 Chronic kidney disease, stage 1: Secondary | ICD-10-CM

## 2019-01-22 DIAGNOSIS — M6281 Muscle weakness (generalized): Secondary | ICD-10-CM | POA: Diagnosis not present

## 2019-01-22 DIAGNOSIS — E1122 Type 2 diabetes mellitus with diabetic chronic kidney disease: Secondary | ICD-10-CM

## 2019-01-22 LAB — CBC WITH DIFFERENTIAL/PLATELET
Basophils Absolute: 0.1 10*3/uL (ref 0.0–0.1)
Basophils Relative: 1 % (ref 0.0–3.0)
Eosinophils Absolute: 0.2 10*3/uL (ref 0.0–0.7)
Eosinophils Relative: 2.5 % (ref 0.0–5.0)
HCT: 36.1 % — ABNORMAL LOW (ref 39.0–52.0)
Hemoglobin: 11.7 g/dL — ABNORMAL LOW (ref 13.0–17.0)
LYMPHS ABS: 3.2 10*3/uL (ref 0.7–4.0)
Lymphocytes Relative: 36.4 % (ref 12.0–46.0)
MCHC: 32.3 g/dL (ref 30.0–36.0)
MCV: 83.7 fl (ref 78.0–100.0)
Monocytes Absolute: 0.7 10*3/uL (ref 0.1–1.0)
Monocytes Relative: 7.8 % (ref 3.0–12.0)
NEUTROS PCT: 52.3 % (ref 43.0–77.0)
Neutro Abs: 4.7 10*3/uL (ref 1.4–7.7)
PLATELETS: 309 10*3/uL (ref 150.0–400.0)
RBC: 4.32 Mil/uL (ref 4.22–5.81)
RDW: 16.6 % — AB (ref 11.5–15.5)
WBC: 8.9 10*3/uL (ref 4.0–10.5)

## 2019-01-22 LAB — LIPID PANEL
Cholesterol: 123 mg/dL (ref 0–200)
HDL: 49.8 mg/dL (ref >39.00)
LDL CALC: 35 mg/dL (ref 0–99)
NonHDL: 72.8
Total CHOL/HDL Ratio: 2
Triglycerides: 190 mg/dL — ABNORMAL HIGH (ref 0.0–149.0)
VLDL: 38 mg/dL (ref 0.0–40.0)

## 2019-01-22 LAB — COMPREHENSIVE METABOLIC PANEL
ALT: 9 U/L (ref 0–53)
AST: 19 U/L (ref 0–37)
Albumin: 4 g/dL (ref 3.5–5.2)
Alkaline Phosphatase: 52 U/L (ref 39–117)
BUN: 16 mg/dL (ref 6–23)
CO2: 25 meq/L (ref 19–32)
Calcium: 9.5 mg/dL (ref 8.4–10.5)
Chloride: 102 mEq/L (ref 96–112)
Creatinine, Ser: 1.18 mg/dL (ref 0.40–1.50)
GFR: 58.08 mL/min — ABNORMAL LOW (ref >60.00)
Glucose, Bld: 80 mg/dL (ref 70–99)
Potassium: 4.6 mEq/L (ref 3.5–5.1)
Sodium: 137 mEq/L (ref 135–145)
Total Bilirubin: 0.4 mg/dL (ref 0.2–1.2)
Total Protein: 6.9 g/dL (ref 6.0–8.3)

## 2019-01-22 LAB — HEMOGLOBIN A1C: Hgb A1c MFr Bld: 6.8 % — ABNORMAL HIGH (ref 4.6–6.5)

## 2019-01-27 ENCOUNTER — Encounter: Payer: Medicare Other | Admitting: Family Medicine

## 2019-01-27 ENCOUNTER — Encounter: Payer: Self-pay | Admitting: Family Medicine

## 2019-01-30 ENCOUNTER — Encounter: Payer: Self-pay | Admitting: Adult Health

## 2019-02-05 ENCOUNTER — Ambulatory Visit: Payer: Medicare Other | Admitting: Adult Health

## 2019-02-05 ENCOUNTER — Other Ambulatory Visit: Payer: Self-pay | Admitting: Family Medicine

## 2019-02-05 NOTE — Telephone Encounter (Signed)
Name of Medication: Clonazepam Name of Pharmacy: [facility] Written Date and Quantity: 01/09/19, #30 Last Office Visit and Type: 12/02/18, acute Next Office Visit and Type: 06/02/19, AWV Last Controlled Substance Agreement Date: none Last UDS: none

## 2019-02-08 NOTE — Telephone Encounter (Signed)
Eprescribed.

## 2019-02-12 ENCOUNTER — Other Ambulatory Visit: Payer: Self-pay | Admitting: Family Medicine

## 2019-03-11 ENCOUNTER — Other Ambulatory Visit: Payer: Self-pay | Admitting: Family Medicine

## 2019-03-21 ENCOUNTER — Other Ambulatory Visit: Payer: Self-pay | Admitting: Family Medicine

## 2019-03-23 NOTE — Telephone Encounter (Signed)
Name of Medication: Clonazepam Name of Pharmacy: [facility] Date and Quantity: 02/08/19, #30 Last Office Visit and Type: 12/02/18, acute Next Office Visit and Type: 06/02/19, CPE Pt 2 Last Controlled Substance Agreement Date: none Last UDS: none

## 2019-03-24 NOTE — Telephone Encounter (Signed)
Eprescribed.

## 2019-04-14 ENCOUNTER — Other Ambulatory Visit: Payer: Self-pay | Admitting: Family Medicine

## 2019-04-20 ENCOUNTER — Other Ambulatory Visit: Payer: Self-pay | Admitting: Family Medicine

## 2019-04-20 NOTE — Telephone Encounter (Signed)
Last office visit 12/02/2018 for hospital follow up.  Last refilled 03/24/2019 for #30 with no refills.  CPE scheduled for 06/02/2019.

## 2019-04-21 NOTE — Telephone Encounter (Signed)
Patient is calling to check status of prescription Stated patient is out of medication,

## 2019-04-22 NOTE — Telephone Encounter (Signed)
Eprescribed.

## 2019-04-23 ENCOUNTER — Ambulatory Visit (INDEPENDENT_AMBULATORY_CARE_PROVIDER_SITE_OTHER): Payer: Medicare Other

## 2019-04-23 ENCOUNTER — Other Ambulatory Visit: Payer: Self-pay

## 2019-04-23 ENCOUNTER — Encounter (INDEPENDENT_AMBULATORY_CARE_PROVIDER_SITE_OTHER): Payer: Self-pay | Admitting: Nurse Practitioner

## 2019-04-23 ENCOUNTER — Ambulatory Visit (INDEPENDENT_AMBULATORY_CARE_PROVIDER_SITE_OTHER): Payer: Medicare Other | Admitting: Nurse Practitioner

## 2019-04-23 VITALS — BP 102/71 | HR 76 | Resp 12 | Ht 69.0 in | Wt 183.0 lb

## 2019-04-23 DIAGNOSIS — Z7982 Long term (current) use of aspirin: Secondary | ICD-10-CM | POA: Diagnosis not present

## 2019-04-23 DIAGNOSIS — I63239 Cerebral infarction due to unspecified occlusion or stenosis of unspecified carotid arteries: Secondary | ICD-10-CM

## 2019-04-23 DIAGNOSIS — Z87891 Personal history of nicotine dependence: Secondary | ICD-10-CM

## 2019-04-23 DIAGNOSIS — E1122 Type 2 diabetes mellitus with diabetic chronic kidney disease: Secondary | ICD-10-CM | POA: Diagnosis not present

## 2019-04-23 DIAGNOSIS — N181 Chronic kidney disease, stage 1: Secondary | ICD-10-CM | POA: Diagnosis not present

## 2019-04-23 DIAGNOSIS — E7800 Pure hypercholesterolemia, unspecified: Secondary | ICD-10-CM

## 2019-04-23 DIAGNOSIS — Z7902 Long term (current) use of antithrombotics/antiplatelets: Secondary | ICD-10-CM

## 2019-04-23 DIAGNOSIS — Z79899 Other long term (current) drug therapy: Secondary | ICD-10-CM

## 2019-04-23 DIAGNOSIS — Z7984 Long term (current) use of oral hypoglycemic drugs: Secondary | ICD-10-CM

## 2019-04-23 NOTE — Progress Notes (Addendum)
SUBJECTIVE:  Patient ID: Stephen [name], male    DOB: 10/01/1927, 85 y.o.   MRN: [id number] Chief Complaint  Patient presents with  . Follow-up    HPI  Stephen [name] is a 85 y.o. male The patient is seen for follow up evaluation of carotid stenosis. The carotid stenosis followed by ultrasound.   The patient denies amaurosis fugax. There is no recent history of TIA symptoms or focal motor deficits. There is no prior documented CVA.  The patient is taking enteric-coated aspirin 81 mg daily.  There is no history of migraine headaches. There is no history of seizures.  The patient has a history of coronary artery disease, no recent episodes of angina or shortness of breath. The patient denies PAD or claudication symptoms. There is a history of hyperlipidemia which is being treated with a statin.    Carotid Duplex done today shows 40-59% stenosis of right ICA.  There appears to be low flow within the Left ICA, which has previously been noted as occluded.  However today it is noted with flow and a 1-39% stenosis.   No evidence of subclavian stenosis.   Past Medical History:  Diagnosis Date  . Arthritis of knee   . BPH (benign prostatic hypertrophy)    w/ nocturia  . Coronary artery disease 03/2014   Inferior ST elevation myocardial infarction. Cardiac catheterization showed an occluded mid RCA. He had an angioplasty and drug-eluting stent placement with a 3.0 x 16 mm Promus drug-eluting stent. Ejection fraction was 45% by echo, completed cardiac rehab 06/2014  . CVA (cerebral vascular accident) ([place]) 03/2018  . Dementia ([place])    s/p stroke  . Diabetes type 2, controlled ([place])   . Heart murmur    keeping an eye on it but not treating  . History of basal cell cancer    s/p mohs  . History of colon polyps   . History of hiatal hernia 05/2018   this causes laryngeal spasms. pt was taking nitro for this when bp drops and he ends up with tia symptoms  . History of hypertension    . HLD (hyperlipidemia)    diet controlled in past  . Hypertension   . Insomnia    treated with multiple meds in past  . MI (myocardial infarction) ([place]) 2015  . Rosacea   . Skin cancer   . Sleep apnea    has cpap but does not use properly and does not like it!!  . Stroke ([place]) 03/2018   per cat scan, patient has had several strokes  . TIA (transient ischemic attack)    x 2 since CVA 03-2018  . UTI (urinary tract infection)     Past Surgical History:  Procedure Laterality Date  . CARDIAC CATHETERIZATION  03/2014   [facility];x1 stent  . CAROTID PTA/STENT INTERVENTION Right 05/2018   [facility]  . CAROTID PTA/STENT INTERVENTION Right 06/05/2018   Procedure: CAROTID PTA/STENT INTERVENTION;  Surgeon: Algernon Huxley, MD;  Location: [facility];  Service: Cardiovascular;  Laterality: Right;  . CATARACT EXTRACTION W/PHACO Left 10/09/2016   Procedure: CATARACT EXTRACTION PHACO AND INTRAOCULAR LENS PLACEMENT (IOC);  Surgeon: Birder Robson, MD;  Location: [facility];  Service: Ophthalmology;  Laterality: Left;  [place] 1.13 AP% 18.3 CDE 13.45 Fluid pack lot # [id number]  . COLONOSCOPY  08/2010   hyperplastic polyp, rec rpt 5 yrs  . ESOPHAGOGASTRODUODENOSCOPY  05/2015   dilated stricture, normal biopsies, HH, no definite infection [name] @ [facility])  .  EYE SURGERY Left    cataract extraction  . MOHS SURGERY     basal cell chin/back  . REPLACEMENT TOTAL KNEE Left 08/2006  . VASECTOMY  1970    Social History   Socioeconomic History  . Marital status: Widowed    Spouse name: Not on file  . Number of children: Not on file  . Years of education: Not on file  . Highest education level: Not on file  Occupational History  . Not on file  Social Needs  . Financial resource strain: Not on file  . Food insecurity    Worry: Not on file    Inability: Not on file  . Transportation needs    Medical: Not on file    Non-medical: Not on file  Tobacco Use  . Smoking status: Former Smoker    Types:  Cigarettes    Quit date: 11/12/1962    Years since quitting: 56.4  . Smokeless tobacco: Never Used  . Tobacco comment: Quit 1964  Substance and Sexual Activity  . Alcohol use: Yes    Alcohol/week: 1.0 standard drinks    Types: 1 Cans of beer per week    Comment: rarely  . Drug use: No  . Sexual activity: Never  Lifestyle  . Physical activity    Days per week: Not on file    Minutes per session: Not on file  . Stress: Not on file  Relationships  . Social Herbalist on phone: Not on file    Gets together: Not on file    Attends religious service: Not on file    Active member of club or organization: Not on file    Attends meetings of clubs or organizations: Not on file    Relationship status: Not on file  . Intimate partner violence    Fear of current or ex partner: Not on file    Emotionally abused: Not on file    Physically abused: Not on file    Forced sexual activity: Not on file  Other Topics Concern  . Not on file  Social History Narrative   Lives at [facility] with friend - Osvaldo Angst RN.   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 Vegetarian.    Family History  Problem Relation Age of Onset  . Cancer Father 21       prostate and colon  . Cancer Mother        ovarian or uterine  . CAD Brother 55       MI, smoker  . Parkinson's disease Brother   . COPD Brother   . CAD Brother        MI  . Stroke Sister     Allergies  Allergen Reactions  . Aricept [Donepezil Hcl] Diarrhea    severe  . Belsomra [Suvorexant] Other (See Comments)    Anxiety. Did not work  . Vancomycin Itching, Rash and Other (See Comments)    Burning pain   . Ambien [Zolpidem Tartrate] Other (See Comments)    Gi problems  . Atorvastatin     muscle aches  . Doxycycline Rash  . Penicillins Hives    Has patient  had a PCN reaction causing immediate rash, facial/tongue/throat swelling, SOB or lightheadedness with hypotension: Yes Has patient had a PCN reaction causing severe rash  involving mucus membranes or skin necrosis: Yes Has patient had a PCN reaction that required hospitalization No Has patient had a PCN reaction occurring within the last 10 years: No If all of the above answers are "NO", then may proceed with Cephalosporin use.   . Tamsulosin Other (See Comments)    Leg weakness.      Review of Systems   Review of Systems: Negative Unless Checked Constitutional: [] Weight loss  [] Fever  [] Chills Cardiac: [] Chest pain   []  Atrial Fibrillation  [] Palpitations   [] Shortness of breath when laying flat   [] Shortness of breath with exertion. [] Shortness of breath at rest Vascular:  [] Pain in legs with walking   [] Pain in legs with standing [] Pain in legs when laying flat   [] Claudication    [] Pain in feet when laying flat    [] History of DVT   [] Phlebitis   [] Swelling in legs   [] Varicose veins   [] Non-healing ulcers Pulmonary:   [] Uses home oxygen   [] Productive cough   [] Hemoptysis   [] Wheeze  [] COPD   [] Asthma Neurologic:  [] Dizziness   [] Seizures  [] Blackouts [x] History of stroke   [] History of TIA  [] Aphasia   [] Temporary Blindness   [] Weakness or numbness in arm   [] Weakness or numbness in leg Musculoskeletal:   [] Joint swelling   [] Joint pain   [] Low back pain  []  History of Knee Replacement [x] Arthritis [] back Surgeries  []  Spinal Stenosis    Hematologic:  [] Easy bruising  [] Easy bleeding   [] Hypercoagulable state   [x] Anemic Gastrointestinal:  [] Diarrhea   [] Vomiting  [] Gastroesophageal reflux/heartburn   [] Difficulty swallowing. [] Abdominal pain Genitourinary:  [] Chronic kidney disease   [] Difficult urination  [] Anuric   [] Blood in urine [] Frequent urination  [] Burning with urination   [] Hematuria Skin:  [] Rashes   [] Ulcers [] Wounds Psychological:  [] History of anxiety   []  History of major  depression  [x]  Memory Difficulties      OBJECTIVE:   Physical Exam  BP 102/71 (BP Location: Right Arm, Patient Position: Sitting, Cuff Size: Normal)   Pulse 76   Resp 12   Ht 5\' 9"  (1.753 m)   Wt 183 lb (83 kg)   BMI 27.02 kg/m   Gen: WD/WN, NAD Head: [place]/AT, No temporalis wasting.  Ear/Nose/Throat: Hearing grossly intact, nares w/o erythema or drainage Eyes: PER, EOMI, sclera nonicteric.  Neck: Supple, no masses.  No JVD.  Pulmonary:  Good air movement, no use of accessory muscles.  Cardiac: RRR Vascular:  Vessel Right Left  Radial Palpable Palpable   Gastrointestinal: soft, non-distended. No guarding/no peritoneal signs.  Musculoskeletal: Ambulates with cane.  No deformity or atrophy.  Neurologic: Pain and light touch intact in extremities.  Symmetrical.  Speech is fluent. Motor exam as listed above. Psychiatric: Judgment intact, Mood & affect appropriate for pt's clinical situation. Dermatologic: No Venous rashes. No Ulcers Noted.  No changes consistent with cellulitis. Lymph : No Cervical lymphadenopathy, no lichenification or skin changes of chronic lymphedema.       ASSESSMENT AND PLAN:  1. Carotid stenosis, symptomatic, with infarction [facility]) Recommend:  The patient is s/p successful right Carotid stent   Carotid Duplex done today shows 40-59% stenosis of right ICA.  There appears to be low flow within the Left ICA, which has previously been noted as occluded.  No evidence of subclavian stenosis.   Continue antiplatelet therapy as prescribed Continue management of CAD, HTN and Hyperlipidemia Healthy heart diet,  encouraged exercise at least 4 times per week  Due to possible re-cannulized flow in LICA we will obtain a CT angio to confirm possible presence.     - VAS US CAROTID; Future - CT ANGIO HEAD W OR WO CONTRAST; Future - CT Angio Neck W/Cm &/Or Wo/Cm; Future  2. Pure hypercholesterolemia Continue statin as ordered and reviewed, no changes at this time   3. Controlled type 2 diabetes mellitus with stage 1 chronic kidney disease, without long-term current use of insulin (HCC) Continue hypoglycemic medications as already ordered, these medications have been reviewed and there are no changes at this time.  Hgb A1C to be monitored as already arranged by primary service      Current Outpatient Medications on File Prior to Visit  Medication Sig Dispense Refill  . acetaminophen (TYLENOL) 500 MG tablet Take 1 tablet (500 mg total) by mouth 2 (two) times daily. 30 tablet 0  . aspirin EC 81 MG tablet Take 81 mg by mouth every morning.    . clonazePAM (KLONOPIN) 1 MG tablet TAKE 1/2 TO 1 TABLET BY MOUTH AT BEDTIME 30 tablet 0  . clopidogrel (PLAVIX) 75 MG tablet TAKE 1 TABLET (75 MG TOTAL) BY MOUTH AT BEDTIME. 90 tablet 0  . doxazosin (CARDURA) 1 MG tablet TAKE 1 TABLET BY MOUTH EVERY DAY 90 tablet 0  . finasteride (PROSCAR) 5 MG tablet TAKE 1 TABLET BY MOUTH EVERY DAY 90 tablet 0  . memantine (NAMENDA) 10 MG tablet TAKE 1 TABLET BY MOUTH TWICE A DAY 180 tablet 2  . metFORMIN (GLUCOPHAGE) 500 MG tablet TAKE 1 TABLET (500 MG TOTAL) BY MOUTH 2 (TWO) TIMES DAILY WITH A MEAL. 180 tablet 1  . Multiple Vitamin (MULTIVITAMIN WITH MINERALS) TABS tablet Take 1 tablet by mouth daily.    . pantoprazole (PROTONIX) 40 MG tablet TAKE 1 TABLET BY MOUTH TWICE A DAY 180 tablet 0  . pravastatin (PRAVACHOL) 40 MG tablet TAKE 1 TABLET BY MOUTH EVERY DAY 90 tablet 3  . vitamin B-12 (CYANOCOBALAMIN) 500 MCG tablet Take 500 mcg by mouth daily.    . diclofenac sodium (VOLTAREN) 1 % GEL Apply 2 g topically 3 (three) times daily.   1   No current facility-administered medications on file prior to visit.     There are no Patient Instructions on file for this visit. No follow-ups on file.   Kris Hartmann, NP  This note was completed with Sales executive.  Any errors are purely unintentional.

## 2019-04-24 NOTE — Addendum Note (Signed)
Addended by: Kris Hartmann on: 04/24/2019 09:46 AM   Modules accepted: Orders

## 2019-05-06 ENCOUNTER — Ambulatory Visit
Admission: RE | Admit: 2019-05-06 | Discharge: 2019-05-06 | Disposition: A | Discharge status: Home / Self Care | Payer: Medicare Other | Source: Ambulatory Visit | Attending: Nurse Practitioner | Admitting: Nurse Practitioner

## 2019-05-06 ENCOUNTER — Other Ambulatory Visit: Payer: Self-pay

## 2019-05-06 DIAGNOSIS — I63239 Cerebral infarction due to unspecified occlusion or stenosis of unspecified carotid arteries: Secondary | ICD-10-CM | POA: Diagnosis not present

## 2019-05-06 DIAGNOSIS — I6523 Occlusion and stenosis of bilateral carotid arteries: Secondary | ICD-10-CM | POA: Diagnosis not present

## 2019-05-06 LAB — POCT I-STAT CREATININE: Creatinine, Ser: 1.2 mg/dL (ref 0.61–1.24)

## 2019-05-06 MED ORDER — IOHEXOL 350 MG/ML SOLN
75.0000 mL | Freq: Once | INTRAVENOUS | Status: AC | PRN
  Administered 2019-05-06: 75 mL via INTRAVENOUS

## 2019-05-08 ENCOUNTER — Other Ambulatory Visit: Payer: Self-pay

## 2019-05-08 ENCOUNTER — Encounter (INDEPENDENT_AMBULATORY_CARE_PROVIDER_SITE_OTHER): Payer: Self-pay | Admitting: Vascular Surgery

## 2019-05-08 ENCOUNTER — Ambulatory Visit (INDEPENDENT_AMBULATORY_CARE_PROVIDER_SITE_OTHER): Payer: Medicare Other | Admitting: Vascular Surgery

## 2019-05-08 VITALS — BP 120/73 | HR 73 | Resp 16 | Wt 183.0 lb

## 2019-05-08 DIAGNOSIS — Z87891 Personal history of nicotine dependence: Secondary | ICD-10-CM | POA: Diagnosis not present

## 2019-05-08 DIAGNOSIS — J8410 Pulmonary fibrosis, unspecified: Secondary | ICD-10-CM

## 2019-05-08 DIAGNOSIS — E7800 Pure hypercholesterolemia, unspecified: Secondary | ICD-10-CM | POA: Diagnosis not present

## 2019-05-08 DIAGNOSIS — Z7902 Long term (current) use of antithrombotics/antiplatelets: Secondary | ICD-10-CM

## 2019-05-08 DIAGNOSIS — N181 Chronic kidney disease, stage 1: Secondary | ICD-10-CM

## 2019-05-08 DIAGNOSIS — Z7984 Long term (current) use of oral hypoglycemic drugs: Secondary | ICD-10-CM | POA: Diagnosis not present

## 2019-05-08 DIAGNOSIS — Z79899 Other long term (current) drug therapy: Secondary | ICD-10-CM | POA: Diagnosis not present

## 2019-05-08 DIAGNOSIS — E1122 Type 2 diabetes mellitus with diabetic chronic kidney disease: Secondary | ICD-10-CM | POA: Diagnosis not present

## 2019-05-08 DIAGNOSIS — I63239 Cerebral infarction due to unspecified occlusion or stenosis of unspecified carotid arteries: Secondary | ICD-10-CM

## 2019-05-08 NOTE — Progress Notes (Signed)
MRN : [id number]  Stephen [name] is a 85 y.o. (05-Jun-1927) male who presents with chief complaint of  Chief Complaint  Patient presents with   Follow-up    ct results  .  History of Present Illness: Patient returns in follow of his carotid disease after his CT scan which I have independently reviewed.  He has a widely patent right carotid stent.  The left carotid system may have a trickle of flow in the ICA, but it is tiny and functionally occluded all the way up to the intracranial portion where it normalizes.  He is doing well with no new complaints.  Previous history of stroke, but no recent symptoms.   Current Outpatient Medications  Medication Sig Dispense Refill   acetaminophen (TYLENOL) 500 MG tablet Take 1 tablet (500 mg total) by mouth 2 (two) times daily. 30 tablet 0   aspirin EC 81 MG tablet Take 81 mg by mouth every morning.     clonazePAM (KLONOPIN) 1 MG tablet TAKE 1/2 TO 1 TABLET BY MOUTH AT BEDTIME 30 tablet 0   clopidogrel (PLAVIX) 75 MG tablet TAKE 1 TABLET (75 MG TOTAL) BY MOUTH AT BEDTIME. 90 tablet 0   diclofenac sodium (VOLTAREN) 1 % GEL Apply 2 g topically 3 (three) times daily.   1   doxazosin (CARDURA) 1 MG tablet TAKE 1 TABLET BY MOUTH EVERY DAY 90 tablet 0   finasteride (PROSCAR) 5 MG tablet TAKE 1 TABLET BY MOUTH EVERY DAY 90 tablet 0   memantine (NAMENDA) 10 MG tablet TAKE 1 TABLET BY MOUTH TWICE A DAY 180 tablet 2   metFORMIN (GLUCOPHAGE) 500 MG tablet TAKE 1 TABLET (500 MG TOTAL) BY MOUTH 2 (TWO) TIMES DAILY WITH A MEAL. 180 tablet 1   Multiple Vitamin (MULTIVITAMIN WITH MINERALS) TABS tablet Take 1 tablet by mouth daily.     pantoprazole (PROTONIX) 40 MG tablet TAKE 1 TABLET BY MOUTH TWICE A DAY 180 tablet 0   pravastatin (PRAVACHOL) 40 MG tablet TAKE 1 TABLET BY MOUTH EVERY DAY 90 tablet 3   vitamin B-12 (CYANOCOBALAMIN) 500 MCG tablet Take 500 mcg by mouth daily.     No current facility-administered medications for this visit.      Past Medical History:  Diagnosis Date   Arthritis of knee    BPH (benign prostatic hypertrophy)    w/ nocturia   Coronary artery disease 03/2014   Inferior ST elevation myocardial infarction. Cardiac catheterization showed an occluded mid RCA. He had an angioplasty and drug-eluting stent placement with a 3.0 x 16 mm Promus drug-eluting stent. Ejection fraction was 45% by echo, completed cardiac rehab 06/2014   CVA (cerebral vascular accident) ([place]) 03/2018   Dementia ([place])    s/p stroke   Diabetes type 2, controlled ([place])    Pt is taking Metformin   Heart murmur    keeping an eye on it but not treating   History of basal cell cancer    s/p mohs   History of colon polyps    History of hiatal hernia 05/2018   this causes laryngeal spasms. pt was taking nitro for this when bp drops and he ends up with tia symptoms   History of hypertension    HLD (hyperlipidemia)    diet controlled in past   Hypertension    Insomnia    treated with multiple meds in past   MI (myocardial infarction) ([place]) 2015   Rosacea    Skin cancer    Sleep  apnea    has cpap but does not use properly and does not like it!!   Stroke ([place]) 03/2018   per cat scan, patient has had several strokes   TIA (transient ischemic attack)    x 2 since CVA 03-2018   UTI (urinary tract infection)     Past Surgical History:  Procedure Laterality Date   CARDIAC CATHETERIZATION  03/2014   [facility];x1 stent   CAROTID PTA/STENT INTERVENTION Right 05/2018   [facility]   CAROTID PTA/STENT INTERVENTION Right 06/05/2018   Procedure: CAROTID PTA/STENT INTERVENTION;  Surgeon: Algernon Huxley, MD;  Location: [facility];  Service: Cardiovascular;  Laterality: Right;   CATARACT EXTRACTION W/PHACO Left 10/09/2016   Procedure: CATARACT EXTRACTION PHACO AND INTRAOCULAR LENS PLACEMENT (IOC);  Surgeon: Birder Robson, MD;  Location: [facility];  Service: Ophthalmology;  Laterality: Left;  [place] 1.13 AP% 18.3 CDE  13.45 Fluid pack lot # [id number]   COLONOSCOPY  08/2010   hyperplastic polyp, rec rpt 5 yrs   ESOPHAGOGASTRODUODENOSCOPY  05/2015   dilated stricture, normal biopsies, HH, no definite infection [name] @ [facility])   EYE SURGERY Left    cataract extraction   MOHS SURGERY     basal cell chin/back   REPLACEMENT TOTAL KNEE Left 08/2006   VASECTOMY  1970    Social History Social History   Tobacco Use   Smoking status: Former Smoker    Types: Cigarettes    Quit date: 11/12/1962    Years since quitting: 56.5   Smokeless tobacco: Never Used   Tobacco comment: Quit 1964  Substance Use Topics   Alcohol use: Yes    Alcohol/week: 1.0 standard drinks    Types: 1 Cans of beer per week    Comment: rarely   Drug use: No     Family History Family History  Problem Relation Age of Onset   Cancer Father 72       prostate and colon   Cancer Mother        ovarian or uterine   CAD Brother 46       MI, smoker   Parkinson's disease Brother    COPD Brother    CAD Brother        MI   Stroke Sister     Allergies  Allergen Reactions   Aricept [Donepezil Hcl] Diarrhea    severe   Belsomra [Suvorexant] Other (See Comments)    Anxiety. Did not work   Vancomycin Itching, Rash and Other (See Comments)    Burning pain    Ambien [Zolpidem Tartrate] Other (See Comments)    Gi problems   Atorvastatin     muscle aches   Doxycycline Rash   Penicillins Hives    Has patient had a PCN reaction causing immediate rash, facial/tongue/throat swelling, SOB or lightheadedness with hypotension: Yes Has patient had a PCN reaction causing severe rash involving mucus membranes or skin necrosis: Yes Has patient had a PCN reaction that required hospitalization No Has patient had a PCN reaction occurring within the last 10 years: No If all of the above answers are "NO", then may proceed with Cephalosporin use.    Tamsulosin Other (See Comments)    Leg weakness.     Review of Systems:  Negative Unless Checked Constitutional: [] ?Weight loss[] ?Fever[] ?Chills Cardiac:[] ?Chest pain[] ? Atrial Fibrillation[] ?Palpitations [] ?Shortness of breath when laying flat [] ?Shortness of breath with exertion. [] ?Shortness of breath at rest Vascular: [] ?Pain in legs with walking[] ?Pain in legswith standing[] ?Pain in legs when laying flat [] ?Claudication  [] ?  Pain in feet when laying flat [] ?History of DVT [] ?Phlebitis [] ?Swelling in legs [] ?Varicose veins [] ?Non-healing ulcers Pulmonary: [] ?Uses home oxygen [] ?Productive cough[] ?Hemoptysis [] ?Wheeze [] ?COPD [] ?Asthma Neurologic: [] ?Dizziness[] ?Seizures [] ?Blackouts[x] ?History of stroke [] ?History of TIA[] ?Aphasia [] ?Temporary Blindness[] ?Weaknessor numbness in arm [] ?Weakness or numbnessin leg Musculoskeletal:[] ?Joint swelling [] ?Joint pain [] ?Low back pain  [] ? History of Knee Replacement [x] ?Arthritis [] ?back Surgeries[] ? Spinal Stenosis  Hematologic:[] ?Easy bruising[] ?Easy bleeding [] ?Hypercoagulable state [x] ?Anemic Gastrointestinal:[] ?Diarrhea [] ?Vomiting[] ?Gastroesophageal reflux/heartburn[] ?Difficulty swallowing. [] ?Abdominal pain Genitourinary: [] ?Chronic kidney disease [] ?Difficulturination [] ?Anuric[] ?Blood in urine [] ?Frequenturination [] ?Burning with urination[] ?Hematuria Skin: [] ?Rashes [] ?Ulcers [] ?Wounds Psychological: [] ?History of anxiety[] ?History of major depression  [x] ? Memory Difficulties   Physical Examination  Vitals:   05/08/19 1156  BP: 120/73  Pulse: 73  Resp: 16  Weight: 183 lb (83 kg)   Body mass index is 27.02 kg/m. Gen:  WD/WN, NAD Head: [place]/AT, No temporalis wasting. Ear/Nose/Throat: Hearing grossly intact, nares w/o erythema or drainage, trachea midline Eyes: Conjunctiva clear. Sclera non-icteric Neck: Supple.   Pulmonary:  Good air movement, equal and clear to auscultation bilaterally.    Cardiac: RRR, No JVD Vascular:  Vessel Right Left  Radial Palpable Palpable               Musculoskeletal: M/S 5/5 throughout.  No deformity or atrophy. Trace LE edema. Neurologic: CN 2-12 intact. Sensation grossly intact in extremities.  Symmetrical.  Speech is fluent. Motor exam as listed above. Psychiatric: Judgment intact, Mood & affect appropriate for pt's clinical situation. Dermatologic: No rashes or ulcers noted.  No cellulitis or open wounds.      CBC Lab Results  Component Value Date   WBC 8.9 01/22/2019   HGB 11.7 (L) 01/22/2019   HCT 36.1 (L) 01/22/2019   MCV 83.7 01/22/2019   PLT 309.0 01/22/2019    BMET    Component Value Date/Time   NA 137 01/22/2019 1239   NA 138 03/23/2014 1612   K 4.6 01/22/2019 1239   K 4.3 03/23/2014 1612   K 5.1 05/22/2013   CL 102 01/22/2019 1239   CL 105 03/23/2014 1612   CO2 25 01/22/2019 1239   CO2 29 03/23/2014 1612   GLUCOSE 80 01/22/2019 1239   GLUCOSE 128 (H) 03/23/2014 1612   BUN 16 01/22/2019 1239   BUN 22 (H) 03/23/2014 1612   CREATININE 1.20 05/06/2019 1136   CREATININE 1.30 03/23/2014 1612   CREATININE 1.14 05/22/2013   CALCIUM 9.5 01/22/2019 1239   CALCIUM 9.2 03/23/2014 1612   GFRNONAA 57 (L) 11/24/2018 0341   GFRNONAA 50 (L) 03/23/2014 1612   GFRAA >60 11/24/2018 0341   GFRAA 58 (L) 03/23/2014 1612   Estimated Creatinine Clearance: 41.7 mL/min (by C-G formula based on SCr of 1.2 mg/dL).  COAG Lab Results  Component Value Date   INR 1.07 06/04/2018   INR 1.06 04/26/2018   INR 1.05 03/15/2018    Radiology Ct Angio Head W Or Wo Contrast  Result Date: 05/06/2019 CLINICAL DATA:  Carotid stenosis, status post stent.  Prior infarct. EXAM: CT ANGIOGRAPHY HEAD AND NECK TECHNIQUE: Multidetector CT imaging of the head and neck was performed using the standard protocol during bolus administration of intravenous contrast. Multiplanar CT image reconstructions and MIPs were obtained to evaluate the vascular  anatomy. Carotid stenosis measurements (when applicable) are obtained utilizing NASCET criteria, using the distal internal carotid diameter as the denominator. CONTRAST:  11mL OMNIPAQUE IOHEXOL 350 MG/ML SOLN COMPARISON:  CTA head neck 05/09/2018 FINDINGS: CT HEAD FINDINGS Brain: There is no mass, hemorrhage or extra-axial collection. There is generalized atrophy without  lobar predilection. There is hypoattenuation of the periventricular white matter, most commonly indicating chronic ischemic microangiopathy. Skull: The visualized skull base, calvarium and extracranial soft tissues are normal. Sinuses/Orbits: No fluid levels or advanced mucosal thickening of the visualized paranasal sinuses. No mastoid or middle ear effusion. The orbits are normal. CTA NECK FINDINGS SKELETON: There is no bony spinal canal stenosis. No lytic or blastic lesion. OTHER NECK: Normal pharynx, larynx and major salivary glands. No cervical lymphadenopathy. Unremarkable thyroid gland. UPPER CHEST: Incompletely visualized nodular opacity in the anterior left upper lobe (series 15, image 178). AORTIC ARCH: There is mild calcific atherosclerosis of the aortic arch. There is no aneurysm, dissection or hemodynamically significant stenosis of the visualized ascending aorta and aortic arch. Conventional 3 vessel aortic branching pattern. The visualized proximal subclavian arteries are widely patent. RIGHT CAROTID SYSTEM: --Common carotid artery: Status post placement of stent in the mid right common carotid artery extending into the carotid bifurcation. The stent lumen is widely patent. --Internal carotid artery: Mild proximal narrowing but no hemodynamically significant stenosis. --External carotid artery: No acute abnormality. LEFT CAROTID SYSTEM: --Common carotid artery: Widely patent origin without common carotid artery dissection or aneurysm. --Internal carotid artery: Unchanged near occlusion with angiographic string sign along most of the  length of the left ICA. Large amount of atherosclerotic plaque within proximal ICA. --External carotid artery: No acute abnormality. VERTEBRAL ARTERIES: Right dominant configuration. Both origins are clearly patent. No dissection, occlusion or flow-limiting stenosis to the skull base (V1-V3 segments). CTA HEAD FINDINGS POSTERIOR CIRCULATION: --Vertebral arteries: Normal V4 segments. --Posterior inferior cerebellar arteries (PICA): Patent origins from the vertebral arteries. --Anterior inferior cerebellar arteries (AICA): Patent origins from the basilar artery. --Basilar artery: Normal. --Superior cerebellar arteries: Normal. --Posterior cerebral arteries (PCA): Normal. There are bilateral posterior communicating arteries (p-comm) that partially supply the PCAs. ANTERIOR CIRCULATION: --Intracranial internal carotid arteries: There is reconstitution of the left internal carotid artery at the petrous segment, unchanged. There is atherosclerotic calcification of both cavernous segments without high-grade stenosis. --Anterior cerebral arteries (ACA): Normal. Hypoplastic left A1 segment, normal variant --Middle cerebral arteries (MCA): Normal. VENOUS SINUSES: As permitted by contrast timing, patent. ANATOMIC VARIANTS: None Review of the MIP images confirms the above findings. IMPRESSION: 1. Paint right carotid stent without hemodynamically significant stenosis of the right carotid system. 2. Unchanged appearance of the left internal carotid artery with near total occlusion along its entire length and reconstitution at the skull base. 3. No intracranial occlusion or high grade stenosis. 4. Aortic atherosclerosis (ICD10-I70.0). 5. Incompletely visualized nodular opacity in the left upper lobe measuring up to 1.6 cm. Consider one of the following in 3 months for both low-risk and high-risk individuals: (a) repeat chest CT, (b) follow-up PET-CT, or (c) tissue sampling. This recommendation follows the consensus statement:  Guidelines for Management of Incidental Pulmonary Nodules Detected on CT Images: From the Fleischner Society 2017; Radiology 2017; 284:228-243. Electronically Signed   By: Ulyses Jarred M.D.   On: 05/06/2019 23:59   Ct Angio Neck W/cm &/or Wo/cm  Result Date: 05/06/2019 CLINICAL DATA:  Carotid stenosis, status post stent.  Prior infarct. EXAM: CT ANGIOGRAPHY HEAD AND NECK TECHNIQUE: Multidetector CT imaging of the head and neck was performed using the standard protocol during bolus administration of intravenous contrast. Multiplanar CT image reconstructions and MIPs were obtained to evaluate the vascular anatomy. Carotid stenosis measurements (when applicable) are obtained utilizing NASCET criteria, using the distal internal carotid diameter as the denominator. CONTRAST:  27mL OMNIPAQUE IOHEXOL 350 MG/ML  SOLN COMPARISON:  CTA head neck 05/09/2018 FINDINGS: CT HEAD FINDINGS Brain: There is no mass, hemorrhage or extra-axial collection. There is generalized atrophy without lobar predilection. There is hypoattenuation of the periventricular white matter, most commonly indicating chronic ischemic microangiopathy. Skull: The visualized skull base, calvarium and extracranial soft tissues are normal. Sinuses/Orbits: No fluid levels or advanced mucosal thickening of the visualized paranasal sinuses. No mastoid or middle ear effusion. The orbits are normal. CTA NECK FINDINGS SKELETON: There is no bony spinal canal stenosis. No lytic or blastic lesion. OTHER NECK: Normal pharynx, larynx and major salivary glands. No cervical lymphadenopathy. Unremarkable thyroid gland. UPPER CHEST: Incompletely visualized nodular opacity in the anterior left upper lobe (series 15, image 178). AORTIC ARCH: There is mild calcific atherosclerosis of the aortic arch. There is no aneurysm, dissection or hemodynamically significant stenosis of the visualized ascending aorta and aortic arch. Conventional 3 vessel aortic branching pattern. The  visualized proximal subclavian arteries are widely patent. RIGHT CAROTID SYSTEM: --Common carotid artery: Status post placement of stent in the mid right common carotid artery extending into the carotid bifurcation. The stent lumen is widely patent. --Internal carotid artery: Mild proximal narrowing but no hemodynamically significant stenosis. --External carotid artery: No acute abnormality. LEFT CAROTID SYSTEM: --Common carotid artery: Widely patent origin without common carotid artery dissection or aneurysm. --Internal carotid artery: Unchanged near occlusion with angiographic string sign along most of the length of the left ICA. Large amount of atherosclerotic plaque within proximal ICA. --External carotid artery: No acute abnormality. VERTEBRAL ARTERIES: Right dominant configuration. Both origins are clearly patent. No dissection, occlusion or flow-limiting stenosis to the skull base (V1-V3 segments). CTA HEAD FINDINGS POSTERIOR CIRCULATION: --Vertebral arteries: Normal V4 segments. --Posterior inferior cerebellar arteries (PICA): Patent origins from the vertebral arteries. --Anterior inferior cerebellar arteries (AICA): Patent origins from the basilar artery. --Basilar artery: Normal. --Superior cerebellar arteries: Normal. --Posterior cerebral arteries (PCA): Normal. There are bilateral posterior communicating arteries (p-comm) that partially supply the PCAs. ANTERIOR CIRCULATION: --Intracranial internal carotid arteries: There is reconstitution of the left internal carotid artery at the petrous segment, unchanged. There is atherosclerotic calcification of both cavernous segments without high-grade stenosis. --Anterior cerebral arteries (ACA): Normal. Hypoplastic left A1 segment, normal variant --Middle cerebral arteries (MCA): Normal. VENOUS SINUSES: As permitted by contrast timing, patent. ANATOMIC VARIANTS: None Review of the MIP images confirms the above findings. IMPRESSION: 1. Paint right carotid stent  without hemodynamically significant stenosis of the right carotid system. 2. Unchanged appearance of the left internal carotid artery with near total occlusion along its entire length and reconstitution at the skull base. 3. No intracranial occlusion or high grade stenosis. 4. Aortic atherosclerosis (ICD10-I70.0). 5. Incompletely visualized nodular opacity in the left upper lobe measuring up to 1.6 cm. Consider one of the following in 3 months for both low-risk and high-risk individuals: (a) repeat chest CT, (b) follow-up PET-CT, or (c) tissue sampling. This recommendation follows the consensus statement: Guidelines for Management of Incidental Pulmonary Nodules Detected on CT Images: From the Fleischner Society 2017; Radiology 2017; 284:228-243. Electronically Signed   By: Ulyses Jarred M.D.   On: 05/06/2019 23:59   Vas US Carotid  Result Date: 04/24/2019 Carotid Arterial Duplex Study Indications:       06/05/2018 Right ICA stent. 03/16/2018 Left ICA occlusion. Comparison Study:  10/20/2018 Performing Technologist: Almira Coaster RVS Supporting Technologist: Blondell Reveal RT, RDMS, RVT  Examination Guidelines: A complete evaluation includes B-mode imaging, spectral Doppler, color Doppler, and power Doppler as needed of all  accessible portions of each vessel. Bilateral testing is considered an integral part of a complete examination. Limited examinations for reoccurring indications may be performed as noted.  Right Carotid Findings: +----------+--------+--------+--------+--------+----------------+             PSV cm/s EDV cm/s Stenosis Describe Comments          +----------+--------+--------+--------+--------+----------------+  CCA Prox   57       22                                           +----------+--------+--------+--------+--------+----------------+  CCA Mid    71       28                                           +----------+--------+--------+--------+--------+----------------+  CCA Distal 47       16                          stent             +----------+--------+--------+--------+--------+----------------+  ICA Prox   71       21                         stent; shadowing  +----------+--------+--------+--------+--------+----------------+  ICA Mid    127      36                                           +----------+--------+--------+--------+--------+----------------+  ICA Distal 113      27                                           +----------+--------+--------+--------+--------+----------------+  ECA        370      67                                           +----------+--------+--------+--------+--------+----------------+ +----------+--------+-------+----------------+-------------------+             PSV cm/s EDV cms Describe         Arm Pressure (mmHG)  +----------+--------+-------+----------------+-------------------+  Subclavian 65       0       Multiphasic, WNL                      +----------+--------+-------+----------------+-------------------+ +---------+--------+--+--------+--+---------+  Vertebral PSV cm/s 48 EDV cm/s 11 Antegrade  +---------+--------+--+--------+--+---------+  Left Carotid Findings: +----------+--------+--------+--------+------------+--------+             PSV cm/s EDV cm/s Stenosis Describe     Comments  +----------+--------+--------+--------+------------+--------+  CCA Prox   77       13                                       +----------+--------+--------+--------+------------+--------+  CCA Mid  83       17                                       +----------+--------+--------+--------+------------+--------+  CCA Distal 57       13                heterogenous           +----------+--------+--------+--------+------------+--------+  ICA Prox   39       5                 calcific               +----------+--------+--------+--------+------------+--------+  ICA Mid    97       31                                       +----------+--------+--------+--------+------------+--------+  ICA Distal 49        9                                        +----------+--------+--------+--------+------------+--------+  ECA        150      31                heterogenous           +----------+--------+--------+--------+------------+--------+ +----------+--------+--------+----------------+-------------------+  Subclavian PSV cm/s EDV cm/s Describe         Arm Pressure (mmHG)  +----------+--------+--------+----------------+-------------------+             97       0        Multiphasic, WNL                      +----------+--------+--------+----------------+-------------------+ +---------+--------+--+--------+--+---------+  Vertebral PSV cm/s 44 EDV cm/s 11 Antegrade  +---------+--------+--+--------+--+---------+  Summary: Right Carotid: Patent right carotid stent with no internal stenosis. Mildly                elevated velocity at the mid ICA. Significant ECA velocity                increase noted due to takeoff through the stent. Left Carotid: The ICA appears patent with visible significant vessel narrowing               at the proximal-mid segment however no hemodynamically significant               velocity increase was visualized.                The previous exam on 10/20/2018 demonstrated no flow in the ICA.               The remainder of the exam appears relatively unchanged.  *See table(s) above for measurements and observations.  Electronically signed by Leotis Pain MD on 04/24/2019 at 12:18:58 PM.    Final      Assessment/Plan Diabetes type 2, controlled ([place]) blood glucose control important in reducing the progression of atherosclerotic disease. Also, involved in wound healing. On appropriate medications.   HLD (hyperlipidemia) lipid control important in reducing the progression of atherosclerotic disease. Continue statin therapy   Carotid stenosis, symptomatic, with infarction ([place])  Recent CT angiogram shows a patent right carotid artery stent.  The left carotid artery is functionally occluded although  there may be a trickle of flow in the internal carotid artery.  This is atretic all the way up to the skull base and not amenable to revascularization.  No role for intervention at this point.  Patient is doing reasonably well.  Return in 1 year.  Pulmonary interstitial fibrosis (HCC) Abnormality seen on CT.  Caregiver with him says this is known but have asked them to discuss with their PCP at his visit next month.    Leotis Pain, MD  05/08/2019 2:06 PM    This note was created with Dragon medical transcription system.  Any errors from dictation are purely unintentional

## 2019-05-08 NOTE — Assessment & Plan Note (Signed)
Abnormality seen on CT.  Caregiver with him says this is known but have asked them to discuss with their PCP at his visit next month.

## 2019-05-08 NOTE — Assessment & Plan Note (Signed)
lipid control important in reducing the progression of atherosclerotic disease. Continue statin therapy  

## 2019-05-08 NOTE — Assessment & Plan Note (Signed)
blood glucose control important in reducing the progression of atherosclerotic disease. Also, involved in wound healing. On appropriate medications.  

## 2019-05-08 NOTE — Assessment & Plan Note (Signed)
Recent CT angiogram shows a patent right carotid artery stent.  The left carotid artery is functionally occluded although there may be a trickle of flow in the internal carotid artery.  This is atretic all the way up to the skull base and not amenable to revascularization.  No role for intervention at this point.  Patient is doing reasonably well.  Return in 1 year.

## 2019-05-19 DIAGNOSIS — I69320 Aphasia following cerebral infarction: Secondary | ICD-10-CM | POA: Diagnosis not present

## 2019-05-19 DIAGNOSIS — I63512 Cerebral infarction due to unspecified occlusion or stenosis of left middle cerebral artery: Secondary | ICD-10-CM | POA: Diagnosis not present

## 2019-05-19 DIAGNOSIS — F0150 Vascular dementia without behavioral disturbance: Secondary | ICD-10-CM | POA: Diagnosis not present

## 2019-05-19 DIAGNOSIS — F0280 Dementia in other diseases classified elsewhere without behavioral disturbance: Secondary | ICD-10-CM | POA: Diagnosis not present

## 2019-05-19 DIAGNOSIS — R2681 Unsteadiness on feet: Secondary | ICD-10-CM | POA: Diagnosis not present

## 2019-05-19 DIAGNOSIS — G309 Alzheimer's disease, unspecified: Secondary | ICD-10-CM | POA: Diagnosis not present

## 2019-05-21 ENCOUNTER — Other Ambulatory Visit: Payer: Self-pay | Admitting: Family Medicine

## 2019-05-21 NOTE — Telephone Encounter (Signed)
Name of Medication: Clonazepam Name of Pharmacy: [facility] Last Fill or Written Date and Quantity: 04/22/19, #30 Last Office Visit and Type: 12/02/18, acute Next Office Visit and Type: 06/02/19, CPE Pt 2 Last Controlled Substance Agreement Date: none Last UDS: none

## 2019-05-22 NOTE — Telephone Encounter (Signed)
Eprescribed.

## 2019-05-29 ENCOUNTER — Ambulatory Visit: Payer: Medicare Other

## 2019-06-02 ENCOUNTER — Encounter: Payer: Self-pay | Admitting: Family Medicine

## 2019-06-02 ENCOUNTER — Other Ambulatory Visit: Payer: Self-pay

## 2019-06-02 ENCOUNTER — Ambulatory Visit (INDEPENDENT_AMBULATORY_CARE_PROVIDER_SITE_OTHER): Payer: Medicare Other | Admitting: Family Medicine

## 2019-06-02 VITALS — BP 102/62 | HR 85 | Temp 97.9°F | Ht 69.0 in | Wt 185.8 lb

## 2019-06-02 DIAGNOSIS — I63239 Cerebral infarction due to unspecified occlusion or stenosis of unspecified carotid arteries: Secondary | ICD-10-CM

## 2019-06-02 DIAGNOSIS — E7800 Pure hypercholesterolemia, unspecified: Secondary | ICD-10-CM | POA: Diagnosis not present

## 2019-06-02 DIAGNOSIS — E1122 Type 2 diabetes mellitus with diabetic chronic kidney disease: Secondary | ICD-10-CM

## 2019-06-02 DIAGNOSIS — Z8673 Personal history of transient ischemic attack (TIA), and cerebral infarction without residual deficits: Secondary | ICD-10-CM

## 2019-06-02 DIAGNOSIS — F0150 Vascular dementia without behavioral disturbance: Secondary | ICD-10-CM

## 2019-06-02 DIAGNOSIS — Z0000 Encounter for general adult medical examination without abnormal findings: Secondary | ICD-10-CM

## 2019-06-02 DIAGNOSIS — F05 Delirium due to known physiological condition: Secondary | ICD-10-CM | POA: Diagnosis not present

## 2019-06-02 DIAGNOSIS — N1831 Chronic kidney disease, stage 3a: Secondary | ICD-10-CM | POA: Insufficient documentation

## 2019-06-02 DIAGNOSIS — N183 Chronic kidney disease, stage 3 unspecified: Secondary | ICD-10-CM

## 2019-06-02 DIAGNOSIS — Z66 Do not resuscitate: Secondary | ICD-10-CM

## 2019-06-02 DIAGNOSIS — R911 Solitary pulmonary nodule: Secondary | ICD-10-CM

## 2019-06-02 DIAGNOSIS — N181 Chronic kidney disease, stage 1: Secondary | ICD-10-CM

## 2019-06-02 DIAGNOSIS — N400 Enlarged prostate without lower urinary tract symptoms: Secondary | ICD-10-CM

## 2019-06-02 DIAGNOSIS — Z111 Encounter for screening for respiratory tuberculosis: Secondary | ICD-10-CM | POA: Diagnosis not present

## 2019-06-02 DIAGNOSIS — R011 Cardiac murmur, unspecified: Secondary | ICD-10-CM

## 2019-06-02 LAB — POCT GLYCOSYLATED HEMOGLOBIN (HGB A1C): Hemoglobin A1C: 6.3 % — AB (ref 4.0–5.6)

## 2019-06-02 NOTE — Progress Notes (Signed)
This visit was conducted in person.  BP 102/62 (BP Location: Left Arm, Patient Position: Sitting, Cuff Size: Normal)   Pulse 85   Temp 97.9 F (36.6 C) (Oral)   Ht 5\' 9"  (1.753 m)   Wt 185 lb 12.8 oz (84.3 kg)   SpO2 99%   BMI 27.44 kg/m    CC: AMW Subjective:    Patient ID: Stephen [name], male    DOB: November 23, 1926, 85 y.o.   MRN: [id number]  HPI: Stephen [name] is a 85 y.o. male presenting on 06/02/2019 for Medicare Wellness (Wife states that the paitent has been "sundowning" and he has started new medication for this -  seems to helping some, only taking the last 4-5 nights)   Here with partner [name] today.  Did not see health coach this year.  Recent neck CT showing LUL nodule 1.6cm - rec rpt CT imaging in 3 months.   H/o L MCA nonhemorrhagic infarcts presenting with dysarthria aphasia and R sided facial weakness 03/2018 s/p R carotid stent. Known mixed (vacsular and alzheimer) dementia initially noted around 2017. Last saw neurology Manuella Ghazi) earlier this month, note reviewed. On aspirin 81mg  daily, statin, namenda 10mg  bid. Seroquel 25mg  was started for sundowning with delusions - this did not help so he is now off of this. Has recently started abilify 2mg  with some benefit. Continues klonopin at night for RLS as well as CPAP for OSA.   Preventative: Colonoscopy done 2011with HPwould be due for rpt 2016 (done at [facility]). iFOB negative 2016. Aged out.  Prostate cancer screening - age out Flu yearly Pneumvax 2012, prevnar 2016 Td 05/2008  zostavax 05/2008 Shingrix - discussed - declines for now  Advanced directive discussion: Detailed advanced directivescannedintochart 03/2014: HCPOA - Osvaldo Angst. Does not want prolonged life support, but ok with temporary course including CPR if reversible condition. Would be ok with feeding tube.  Seat belt use discussed Sunscreen use discussed. No changing moles on skin. Known h/o rosacea.Sees derm yearly.  Ex smoker remotely  Alcohol - quit recently Dentist yearly  Eye exam yearly  Bowel - no constipation Bladder - mild urge incontinence - not needing diapers at this time.  L[facility] at [facility] with friend - Osvaldo Angst RN.  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  Diet: good water, vegetarian- takes b12 vitamin     Relevant past medical, surgical, family and social history reviewed and updated as indicated. Interim medical history since our last visit reviewed. Allergies and medications reviewed and updated. Outpatient Medications Prior to Visit  Medication Sig Dispense Refill  . ARIPiprazole (ABILIFY) 2 MG tablet Take 1 tablet by mouth daily.    Marland Kitchen acetaminophen (TYLENOL) 500 MG tablet Take 1 tablet (500 mg total) by mouth 2 (two) times daily. 30 tablet 0  . aspirin EC 81 MG tablet Take 81 mg by mouth every morning.    . clonazePAM (KLONOPIN) 1 MG tablet TAKE 1/2-1 TABLET BY MOUTH AT BEDTIME 30 tablet 1  . clopidogrel (PLAVIX) 75 MG tablet TAKE 1 TABLET (75 MG TOTAL) BY MOUTH AT BEDTIME. 90 tablet 0  . diclofenac sodium (VOLTAREN) 1 % GEL Apply 2 g topically 3 (three) times daily.   1  . doxazosin (CARDURA) 1 MG tablet TAKE 1 TABLET BY MOUTH EVERY DAY 90 tablet 0  .  finasteride (PROSCAR) 5 MG tablet TAKE 1 TABLET BY MOUTH EVERY DAY 90 tablet 0  . memantine (NAMENDA) 10 MG tablet TAKE 1 TABLET BY MOUTH TWICE A DAY 180 tablet 2  . metFORMIN (GLUCOPHAGE) 500 MG tablet TAKE 1 TABLET (500 MG TOTAL) BY MOUTH 2 (TWO) TIMES DAILY WITH A MEAL. 180 tablet 1  . Multiple Vitamin (MULTIVITAMIN WITH MINERALS) TABS tablet Take 1 tablet by mouth daily.    . pantoprazole (PROTONIX) 40 MG tablet TAKE 1 TABLET BY MOUTH TWICE A DAY 180 tablet 0  . pravastatin (PRAVACHOL) 40 MG tablet TAKE 1 TABLET BY MOUTH EVERY DAY 90 tablet 3  . vitamin B-12  (CYANOCOBALAMIN) 500 MCG tablet Take 500 mcg by mouth daily.     No facility-administered medications prior to visit.      Per HPI unless specifically indicated in ROS section below Review of Systems Objective:    BP 102/62 (BP Location: Left Arm, Patient Position: Sitting, Cuff Size: Normal)   Pulse 85   Temp 97.9 F (36.6 C) (Oral)   Ht 5\' 9"  (1.753 m)   Wt 185 lb 12.8 oz (84.3 kg)   SpO2 99%   BMI 27.44 kg/m   Wt Readings from Last 3 Encounters:  06/02/19 185 lb 12.8 oz (84.3 kg)  05/08/19 183 lb (83 kg)  04/23/19 183 lb (83 kg)    Physical Exam Vitals signs and nursing note reviewed.  Constitutional:      General: He is not in acute distress.    Appearance: Normal appearance. He is well-developed. He is not ill-appearing.  HENT:     Head: Normocephalic and atraumatic.     Right Ear: Hearing, tympanic membrane, ear canal and external ear normal.     Left Ear: Hearing, tympanic membrane, ear canal and external ear normal.     Nose: Nose normal.     Mouth/Throat:     Mouth: Mucous membranes are moist.     Pharynx: Uvula midline. No oropharyngeal exudate or posterior oropharyngeal erythema.  Eyes:     General: No scleral icterus.    Conjunctiva/sclera: Conjunctivae normal.     Pupils: Pupils are equal, round, and reactive to light.  Neck:     Musculoskeletal: Normal range of motion and neck supple.  Cardiovascular:     Rate and Rhythm: Normal rate and regular rhythm.     Pulses: Normal pulses.          Radial pulses are 2+ on the right side and 2+ on the left side.     Heart sounds: Murmur (3/6 systolic) present.  Pulmonary:     Effort: Pulmonary effort is normal. No respiratory distress.     Breath sounds: Normal breath sounds. No wheezing, rhonchi or rales.  Abdominal:     General: Bowel sounds are normal. There is no distension.     Palpations: Abdomen is soft. There is no mass.     Tenderness: There is no abdominal tenderness. There is no guarding or rebound.   Musculoskeletal: Normal range of motion.  Lymphadenopathy:     Cervical: No cervical adenopathy.  Skin:    General: Skin is warm and dry.     Findings: No rash.  Neurological:     Mental Status: He is alert and oriented to person, place, and time.     Comments: CN grossly intact, station and gait intact  Psychiatric:        Behavior: Behavior normal.        Thought Content:  Thought content normal.        Judgment: Judgment normal.       Results for orders placed or performed in visit on 06/02/19  HgB A1c  Result Value Ref Range   Hemoglobin A1C 6.3 (A) 4.0 - 5.6 %   HbA1c POC (<> result, manual entry)     HbA1c, POC (prediabetic range)     HbA1c, POC (controlled diabetic range)     Lab Results  Component Value Date   CHOL 123 01/22/2019   HDL 49.80 01/22/2019   LDLCALC 35 01/22/2019   TRIG 190.0 (H) 01/22/2019   CHOLHDL 2 01/22/2019    Assessment & Plan:  PPD placed today - to return in 2 days to have it read. Requests forms for [facility] adult daycare filled out.  Problem List Items Addressed This Visit    Vascular dementia [facility])    Mixed dementia (vascular/alzheimers) now seeing neurology on namenda 10mg  bid.       Relevant Medications   ARIPiprazole (ABILIFY) 2 MG tablet   Systolic murmur    Again heard today.       Sundowning    Did not tolerate seroquel, doing better on abilify.       Pulmonary nodule    Partially visualized 1.6cm LUL pulmonary nodule on recent neck CTA. Will check non contrasted CT lungs to further evaluate this.       Relevant Orders   CT Chest Wo Contrast   Medicare annual wellness visit, subsequent - Primary    I have personally reviewed the Medicare Annual Wellness questionnaire and have noted 1. The patient's medical and social history 2. Their use of alcohol, tobacco or illicit drugs 3. Their current medications and supplements 4. The patient's functional ability including ADL's, fall risks, home safety risks and hearing or  visual impairment. Cognitive function has been assessed and addressed as indicated.  5. Diet and physical activity 6. Evidence for depression or mood disorders The patients weight, height, BMI have been recorded in the chart. I have made referrals, counseling and provided education to the patient based on review of the above and I have provided the pt with a written personalized care plan for preventive services. Provider list updated.. See scanned questionairre as needed for further documentation. Reviewed preventative protocols and updated unless pt declined.       HLD (hyperlipidemia)    Chronic. Continue pravastatin. H/o CVA.       History of ischemic left MCA stroke    Continue aspirin, plavix, statin       DNR (do not resuscitate)   Diabetes type 2, controlled ([place])    Update A1c today.       Relevant Orders   HgB A1c (Completed)   CKD (chronic kidney disease) stage 3, GFR 30-59 ml/min (HCC)    Noted CKD over the last 2 years - reviewed with patient and partner, reviewed importance of good hydration status and limiting NSAIDs and other meds cleared through kidneys      Carotid stenosis, symptomatic, with infarction ([place])   Benign prostatic hyperplasia    Continue proscar. Largely asxs.        Other Visit Diagnoses    Tuberculosis screening       Relevant Orders   TB Skin Test (Completed)   Solitary pulmonary nodule       Relevant Orders   CT Chest Wo Contrast       No orders of the defined types were placed in this  encounter.  Orders Placed This Encounter  Procedures  . CT Chest Wo Contrast    Standing Status:   Future    Standing Expiration Date:   08/02/2020    Order Specific Question:   ** REASON FOR EXAM (FREE TEXT)    Answer:   f/u LUL nodule    Order Specific Question:   Preferred imaging location?    Answer:   [facility]    Order Specific Question:   Radiology Contrast Protocol - do NOT remove file path    Answer:    [email or web address]  . TB Skin Test    Order Specific Question:   Has patient ever tested positive?    Answer:   No  . HgB A1c    Follow up plan: Return in about 6 months (around 12/03/2019) for follow up visit.  Ria Bush, MD

## 2019-06-02 NOTE — Assessment & Plan Note (Addendum)
Mixed dementia (vascular/alzheimers) now seeing neurology on namenda 10mg  bid.

## 2019-06-02 NOTE — Patient Instructions (Addendum)
Good to see you today, call us with questions.  Fingerstick A1c today.  PPD placed today - return in 2 days for nurse visit to have it read.  We will order chest CT to look more closely at left upper lobe seen on recent neck CT.  Return in 6 months for follow up visit, sooner if needed   Health Maintenance After Age 83 After age 92, you are at a higher risk for certain long-term diseases and infections as well as injuries from falls. Falls are a major cause of broken bones and head injuries in people who are older than age 52. Getting regular preventive care can help to keep you healthy and well. Preventive care includes getting regular testing and making lifestyle changes as recommended by your health care provider. Talk with your health care provider about:  Which screenings and tests you should have. A screening is a test that checks for a disease when you have no symptoms.  A diet and exercise plan that is right for you. What should I know about screenings and tests to prevent falls? Screening and testing are the best ways to find a health problem early. Early diagnosis and treatment give you the best chance of managing medical conditions that are common after age 51. Certain conditions and lifestyle choices may make you more likely to have a fall. Your health care provider may recommend:  Regular vision checks. Poor vision and conditions such as cataracts can make you more likely to have a fall. If you wear glasses, make sure to get your prescription updated if your vision changes.  Medicine review. Work with your health care provider to regularly review all of the medicines you are taking, including over-the-counter medicines. Ask your health care provider about any side effects that may make you more likely to have a fall. Tell your health care provider if any medicines that you take make you feel dizzy or sleepy.  Osteoporosis screening. Osteoporosis is a condition that causes the bones  to get weaker. This can make the bones weak and cause them to break more easily.  Blood pressure screening. Blood pressure changes and medicines to control blood pressure can make you feel dizzy.  Strength and balance checks. Your health care provider may recommend certain tests to check your strength and balance while standing, walking, or changing positions.  Foot health exam. Foot pain and numbness, as well as not wearing proper footwear, can make you more likely to have a fall.  Depression screening. You may be more likely to have a fall if you have a fear of falling, feel emotionally low, or feel unable to do activities that you used to do.  Alcohol use screening. Using too much alcohol can affect your balance and may make you more likely to have a fall. What actions can I take to lower my risk of falls? General instructions  Talk with your health care provider about your risks for falling. Tell your health care provider if: ? You fall. Be sure to tell your health care provider about all falls, even ones that seem minor. ? You feel dizzy, sleepy, or off-balance.  Take over-the-counter and prescription medicines only as told by your health care provider. These include any supplements.  Eat a healthy diet and maintain a healthy weight. A healthy diet includes low-fat dairy products, low-fat (lean) meats, and fiber from whole grains, beans, and lots of fruits and vegetables. Home safety  Remove any tripping hazards, such as rugs,  cords, and clutter.  Install safety equipment such as grab bars in bathrooms and safety rails on stairs.  Keep rooms and walkways well-lit. Activity   Follow a regular exercise program to stay fit. This will help you maintain your balance. Ask your health care provider what types of exercise are appropriate for you.  If you need a cane or walker, use it as recommended by your health care provider.  Wear supportive shoes that have nonskid  soles. Lifestyle  Do not drink alcohol if your health care provider tells you not to drink.  If you drink alcohol, limit how much you have: ? 0-1 drink a day for women. ? 0-2 drinks a day for men.  Be aware of how much alcohol is in your drink. In the [place] one drink equals one typical bottle of beer (12 oz), one-half glass of wine (5 oz), or one shot of hard liquor (1 oz).  Do not use any products that contain nicotine or tobacco, such as cigarettes and e-cigarettes. If you need help quitting, ask your health care provider. Summary  Having a healthy lifestyle and getting preventive care can help to protect your health and wellness after age 13.  Screening and testing are the best way to find a health problem early and help you avoid having a fall. Early diagnosis and treatment give you the best chance for managing medical conditions that are more common for people who are older than age 94.  Falls are a major cause of broken bones and head injuries in people who are older than age 34. Take precautions to prevent a fall at home.  Work with your health care provider to learn what changes you can make to improve your health and wellness and to prevent falls. This information is not intended to replace advice given to you by your health care provider. Make sure you discuss any questions you have with your health care provider. Document Released: 09/11/2017 Document Revised: 02/19/2019 Document Reviewed: 09/11/2017 Elsevier Patient Education  2020 [employer].

## 2019-06-02 NOTE — Assessment & Plan Note (Addendum)

## 2019-06-02 NOTE — Assessment & Plan Note (Signed)
Noted CKD over the last 2 years - reviewed with patient and partner, reviewed importance of good hydration status and limiting NSAIDs and other meds cleared through kidneys

## 2019-06-02 NOTE — Assessment & Plan Note (Signed)
Did not tolerate seroquel, doing better on abilify.

## 2019-06-02 NOTE — Assessment & Plan Note (Signed)
Partially visualized 1.6cm LUL pulmonary nodule on recent neck CTA. Will check non contrasted CT lungs to further evaluate this.

## 2019-06-02 NOTE — Assessment & Plan Note (Signed)
Update A1c today 

## 2019-06-02 NOTE — Assessment & Plan Note (Signed)
Continue aspirin, plavix, statin

## 2019-06-02 NOTE — Assessment & Plan Note (Signed)
Chronic. Continue pravastatin. H/o CVA.

## 2019-06-02 NOTE — Assessment & Plan Note (Signed)
Again heard today.

## 2019-06-02 NOTE — Assessment & Plan Note (Signed)
Continue proscar. Largely asxs.

## 2019-06-02 NOTE — Progress Notes (Signed)
Tuberculin skin test applied to Left ventral forearm. Patient to come back in office on Thursday 06/04/19 for PPD read (nurse visit).

## 2019-06-04 ENCOUNTER — Other Ambulatory Visit: Payer: Self-pay | Admitting: Family Medicine

## 2019-06-04 LAB — TB SKIN TEST
Induration: 0 mm
TB Skin Test: NEGATIVE

## 2019-07-14 ENCOUNTER — Other Ambulatory Visit: Payer: Self-pay | Admitting: Family Medicine

## 2019-07-15 NOTE — Telephone Encounter (Signed)
LOV 06/09/2019 for AWV. Future appointment on 12/01/2019. Please review refills.

## 2019-07-16 ENCOUNTER — Ambulatory Visit (INDEPENDENT_AMBULATORY_CARE_PROVIDER_SITE_OTHER): Payer: Medicare Other | Admitting: Cardiovascular Disease

## 2019-07-16 ENCOUNTER — Encounter: Payer: Self-pay | Admitting: Cardiovascular Disease

## 2019-07-16 ENCOUNTER — Other Ambulatory Visit: Payer: Self-pay

## 2019-07-16 VITALS — BP 104/68 | HR 80 | Ht 68.0 in | Wt 184.0 lb

## 2019-07-16 DIAGNOSIS — I779 Disorder of arteries and arterioles, unspecified: Secondary | ICD-10-CM

## 2019-07-16 DIAGNOSIS — I739 Peripheral vascular disease, unspecified: Secondary | ICD-10-CM

## 2019-07-16 DIAGNOSIS — E785 Hyperlipidemia, unspecified: Secondary | ICD-10-CM | POA: Diagnosis not present

## 2019-07-16 DIAGNOSIS — I63239 Cerebral infarction due to unspecified occlusion or stenosis of unspecified carotid arteries: Secondary | ICD-10-CM

## 2019-07-16 DIAGNOSIS — I10 Essential (primary) hypertension: Secondary | ICD-10-CM | POA: Diagnosis not present

## 2019-07-16 DIAGNOSIS — I2510 Atherosclerotic heart disease of native coronary artery without angina pectoris: Secondary | ICD-10-CM

## 2019-07-16 NOTE — Patient Instructions (Signed)
Medication Instructions:  Your physician recommends that you continue on your current medications as directed. Please refer to the Current Medication list given to you today.  If you need a refill on your cardiac medications before your next appointment, please call your pharmacy.   Lab work: None ordered If you have labs (blood work) drawn today and your tests are completely normal, you will receive your results only by: . MyChart Message (if you have MyChart) OR . A paper copy in the mail If you have any lab test that is abnormal or we need to change your treatment, we will call you to review the results.  Testing/Procedures: None ordered  Follow-Up: At [facility], you and your health needs are our priority.  As part of our continuing mission to provide you with exceptional heart care, we have created designated Provider Care Teams.  These Care Teams include your primary Cardiologist (physician) and Advanced Practice Providers (APPs -  Physician Assistants and Nurse Practitioners) who all work together to provide you with the care you need, when you need it. You will need a follow up appointment in 6 months.  Please call our office 2 months in advance to schedule this appointment.  You may see Muhammad Arida, MD or one of the following Advanced Practice Providers on your designated Care Team:   [name] Berge, NP Ryan Dunn, PA-C . Jacquelyn Visser, PA-C  Any Other Special Instructions Will Be Listed Below (If Applicable). N/A   

## 2019-07-16 NOTE — Progress Notes (Signed)
Cardiology Office Note   Date:  07/16/2019   ID:  Stephen [name], DOB 1927/05/18, MRN [id number]  PCP:  Ria Bush, MD  Cardiologist:   Kathlyn Sacramento, MD   Chief Complaint  Patient presents with  . Other    6 month follow up. Patient denies chest pain and SOB at this time. Meds reviewed verbally with patient.       History of Present Illness: Stephen [name] is a 85 y.o. male who presents for a followup visit regarding coronary artery disease. He had inferior ST elevation myocardial infarction in May 2015. Emergent cardiac catheterization showed an occluded mid RCA which was treated successfully with PCI and drug-eluting stent placement. Ejection fraction was 45%. He has chronic medical conditions that include type 2 diabetes, hypertension and hyperlipidemia. He is a vegetarian since age 24 and lives at [facility]. He didn't tolerate Atorvastatin to myalgia.  He was hospitalized in May 2019 with a stroke.  He was found to have an occluded left carotid artery with significant stenosis of the right carotid artery.  Echo showed normal LV systolic function.  He was hospitalized in June 2019 with atypical chest pain.  Lexiscan Myoview showed fixed inferior wall defect with no ischemia. He had another TIA in July 2019.  He underwent right carotid artery stenting. He has memory decline due to dementia.  Otherwise he has been doing well with no recent chest pain, shortness of breath or palpitations.  Past Medical History:  Diagnosis Date  . Arthritis of knee   . BPH (benign prostatic hypertrophy)    w/ nocturia  . Coronary artery disease 03/2014   Inferior ST elevation myocardial infarction. Cardiac catheterization showed an occluded mid RCA. He had an angioplasty and drug-eluting stent placement with a 3.0 x 16 mm Promus drug-eluting stent. Ejection fraction was 45% by echo, completed cardiac rehab 06/2014  . CVA (cerebral vascular accident) ([place])  03/2018  . Dementia ([place])    s/p stroke  . Diabetes type 2, controlled ([place])    Pt is taking Metformin  . Heart murmur    keeping an eye on it but not treating  . History of basal cell cancer    s/p mohs  . History of colon polyps   . History of hiatal hernia 05/2018   this causes laryngeal spasms. pt was taking nitro for this when bp drops and he ends up with tia symptoms  . History of hypertension   . HLD (hyperlipidemia)    diet controlled in past  . Hypertension   . Insomnia    treated with multiple meds in past  . MI (myocardial infarction) ([place]) 2015  . Rosacea   . Skin cancer   . Sleep apnea    has cpap but does not use properly and does not like it!!  . Stroke ([place]) 03/2018   per cat scan, patient has had several strokes  . TIA (transient ischemic attack)    x 2 since CVA 03-2018  . UTI (urinary tract infection)     Past Surgical History:  Procedure Laterality Date  . CARDIAC CATHETERIZATION  03/2014   [facility];x1 stent  . CAROTID PTA/STENT INTERVENTION Right 05/2018   [facility]  . CAROTID PTA/STENT INTERVENTION Right 06/05/2018   Procedure: CAROTID PTA/STENT INTERVENTION;  Surgeon: Algernon Huxley, MD;  Location: [place];  Service: Cardiovascular;  Laterality: Right;  . CATARACT EXTRACTION W/PHACO Left 10/09/2016   Procedure: CATARACT EXTRACTION PHACO  AND INTRAOCULAR LENS PLACEMENT (IOC);  Surgeon: Birder Robson, MD;  Location: [facility];  Service: Ophthalmology;  Laterality: Left;  [place] 1.13 AP% 18.3 CDE 13.45 Fluid pack lot # [id number]  . COLONOSCOPY  08/2010   hyperplastic polyp, rec rpt 5 yrs  . ESOPHAGOGASTRODUODENOSCOPY  05/2015   dilated stricture, normal biopsies, HH, no definite infection [name] @ [facility])  . EYE SURGERY Left    cataract extraction  . MOHS SURGERY     basal cell chin/back  . REPLACEMENT TOTAL KNEE Left 08/2006  . VASECTOMY  1970     Current Outpatient Medications  Medication Sig Dispense Refill  . acetaminophen (TYLENOL) 500 MG  tablet Take 1 tablet (500 mg total) by mouth 2 (two) times daily. 30 tablet 0  . aspirin EC 81 MG tablet Take 81 mg by mouth every morning.    . clonazePAM (KLONOPIN) 1 MG tablet TAKE 1/2-1 TABLET BY MOUTH AT BEDTIME 30 tablet 1  . clopidogrel (PLAVIX) 75 MG tablet TAKE 1 TABLET (75 MG TOTAL) BY MOUTH AT BEDTIME. 90 tablet 0  . diclofenac sodium (VOLTAREN) 1 % GEL Apply 2 g topically 3 (three) times daily.   1  . doxazosin (CARDURA) 1 MG tablet TAKE 1 TABLET BY MOUTH EVERY DAY 90 tablet 0  . finasteride (PROSCAR) 5 MG tablet TAKE 1 TABLET BY MOUTH EVERY DAY 90 tablet 1  . memantine (NAMENDA) 10 MG tablet TAKE 1 TABLET BY MOUTH TWICE A DAY 180 tablet 2  . metFORMIN (GLUCOPHAGE) 500 MG tablet TAKE 1 TABLET (500 MG TOTAL) BY MOUTH 2 (TWO) TIMES DAILY WITH A MEAL. 180 tablet 1  . Multiple Vitamin (MULTIVITAMIN WITH MINERALS) TABS tablet Take 1 tablet by mouth daily.    . pantoprazole (PROTONIX) 40 MG tablet TAKE 1 TABLET BY MOUTH TWICE A DAY 180 tablet 1  . pravastatin (PRAVACHOL) 40 MG tablet TAKE 1 TABLET BY MOUTH EVERY DAY 90 tablet 3  . vitamin B-12 (CYANOCOBALAMIN) 500 MCG tablet Take 500 mcg by mouth daily.    . ARIPiprazole (ABILIFY) 2 MG tablet Take 1 tablet by mouth daily.     No current facility-administered medications for this visit.     Allergies:   Aricept [donepezil hcl], Belsomra [suvorexant], Vancomycin, Ambien [zolpidem tartrate], Atorvastatin, Doxycycline, Penicillins, and Tamsulosin    Social History:  The patient  reports that he quit smoking about 56 years ago. His smoking use included cigarettes. He has never used smokeless tobacco. He reports current alcohol use of about 1.0 standard drinks of alcohol per week. He reports that he does not use drugs.   Family History:  The patient's family history includes CAD in his brother; CAD (age of onset: 21) in his brother; COPD in his brother; Cancer in his mother; Cancer (age of onset: 71) in his father; Parkinson's disease in his  brother; Stroke in his sister.    ROS:  Please see the history of present illness.   Otherwise, review of systems are positive for none.   All other systems are reviewed and negative.    PHYSICAL EXAM: VS:  BP 104/68 (BP Location: Left Arm, Patient Position: Sitting, Cuff Size: Normal)   Pulse 80   Ht 5\' 8"  (1.727 m)   Wt 184 lb (83.5 kg)   BMI 27.98 kg/m  , BMI Body mass index is 27.98 kg/m. GEN: Well nourished, well developed, in no acute distress  HEENT: normal  Neck: no JVD, carotid bruits, or masses Cardiac: RRR; no rubs, or gallops,no  edema . There is a 2/6 systolic ejection murmur in the aortic area Respiratory:  clear to auscultation bilaterally, normal work of breathing GI: soft, nontender, nondistended, + BS MS: no deformity or atrophy  Skin: warm and dry, no rash Neuro:  Strength and sensation are intact Psych: euthymic mood, full affect   EKG:  EKG is ordered today. The ekg ordered today demonstrates normal sinus rhythm with old inferior infarct.   Recent Labs: 01/22/2019: ALT 9; BUN 16; Hemoglobin 11.7; Platelets 309.0; Potassium 4.6; Sodium 137 05/06/2019: Creatinine, Ser 1.20    Lipid Panel    Component Value Date/Time   CHOL 123 01/22/2019 1239   CHOL 124 05/04/2014 0806   TRIG 190.0 (H) 01/22/2019 1239   HDL 49.80 01/22/2019 1239   HDL 53 05/04/2014 0806   CHOLHDL 2 01/22/2019 1239   VLDL 38.0 01/22/2019 1239   LDLCALC 35 01/22/2019 1239   LDLCALC 48 05/04/2014 0806      Wt Readings from Last 3 Encounters:  07/16/19 184 lb (83.5 kg)  06/02/19 185 lb 12.8 oz (84.3 kg)  05/08/19 183 lb (83 kg)       ASSESSMENT AND PLAN:  1.  Coronary artery disease involving native coronary arteries without angina: Overall, he is doing well with no anginal symptoms. I recommend continuing medical therapy.  Most recent nuclear stress test showed evidence of prior inferior infarct without ischemia.   Given carotid stenting last year, I favor continued dual  antiplatelet therapy as tolerated.  2.  Carotid artery disease: Known occluded left carotid artery.  He is status post right carotid artery stenting.  3. Hyperlipidemia: Currently on pravastatin with most recent LDL of 35  4.  Essential hypertension: His blood pressure is on the low side.  He is on Cardura for BPH and I advised him to hold the medication if systolic blood pressure is below 100.   Disposition:   FU with me in 6 months  Signed,  Kathlyn Sacramento, MD  07/16/2019 3:08 PM    [facility]

## 2019-07-20 ENCOUNTER — Other Ambulatory Visit: Payer: Self-pay | Admitting: Family Medicine

## 2019-07-21 NOTE — Telephone Encounter (Signed)
Name of Medication: Clonazepam Name of Pharmacy: [facility] or Written Date and Quantity: 05/22/2019 #30 with 1 refill Last Office Visit and Type: 06/02/2019 for AWE Next Office Visit and Type: 12/01/2019 follow up Last Controlled Substance Agreement Date: n/a Last UDS: n/a

## 2019-07-22 NOTE — Telephone Encounter (Signed)
Eprescribed.

## 2019-07-29 ENCOUNTER — Ambulatory Visit
Admission: RE | Admit: 2019-07-29 | Discharge: 2019-07-29 | Disposition: A | Discharge status: Home / Self Care | Payer: Medicare Other | Source: Ambulatory Visit | Attending: Family Medicine | Admitting: Family Medicine

## 2019-07-29 ENCOUNTER — Other Ambulatory Visit: Payer: Self-pay

## 2019-07-29 DIAGNOSIS — R911 Solitary pulmonary nodule: Secondary | ICD-10-CM | POA: Insufficient documentation

## 2019-07-30 DIAGNOSIS — H35372 Puckering of macula, left eye: Secondary | ICD-10-CM | POA: Diagnosis not present

## 2019-07-30 LAB — HM DIABETES EYE EXAM

## 2019-08-01 ENCOUNTER — Encounter: Payer: Self-pay | Admitting: Family Medicine

## 2019-08-05 ENCOUNTER — Encounter: Payer: Self-pay | Admitting: Family Medicine

## 2019-08-06 ENCOUNTER — Encounter: Payer: Self-pay | Admitting: Family Medicine

## 2019-08-06 DIAGNOSIS — R911 Solitary pulmonary nodule: Secondary | ICD-10-CM

## 2019-08-13 ENCOUNTER — Other Ambulatory Visit: Payer: Self-pay

## 2019-08-13 ENCOUNTER — Telehealth: Payer: Self-pay | Admitting: Family Medicine

## 2019-08-13 ENCOUNTER — Encounter
Admission: RE | Admit: 2019-08-13 | Discharge: 2019-08-13 | Disposition: A | Discharge status: Home / Self Care | Payer: Medicare Other | Source: Ambulatory Visit | Attending: Family Medicine | Admitting: Family Medicine

## 2019-08-13 DIAGNOSIS — E119 Type 2 diabetes mellitus without complications: Secondary | ICD-10-CM | POA: Diagnosis not present

## 2019-08-13 DIAGNOSIS — K219 Gastro-esophageal reflux disease without esophagitis: Secondary | ICD-10-CM

## 2019-08-13 DIAGNOSIS — K449 Diaphragmatic hernia without obstruction or gangrene: Secondary | ICD-10-CM

## 2019-08-13 DIAGNOSIS — Z79899 Other long term (current) drug therapy: Secondary | ICD-10-CM | POA: Insufficient documentation

## 2019-08-13 DIAGNOSIS — R918 Other nonspecific abnormal finding of lung field: Secondary | ICD-10-CM | POA: Diagnosis not present

## 2019-08-13 DIAGNOSIS — R911 Solitary pulmonary nodule: Secondary | ICD-10-CM

## 2019-08-13 DIAGNOSIS — E785 Hyperlipidemia, unspecified: Secondary | ICD-10-CM | POA: Insufficient documentation

## 2019-08-13 DIAGNOSIS — R948 Abnormal results of function studies of other organs and systems: Secondary | ICD-10-CM

## 2019-08-13 LAB — GLUCOSE, CAPILLARY: Glucose-Capillary: 96 mg/dL (ref 70–99)

## 2019-08-13 MED ORDER — FLUDEOXYGLUCOSE F - 18 (FDG) INJECTION
9.5000 | Freq: Once | INTRAVENOUS | Status: AC | PRN
  Administered 2019-08-13: 08:00:00 10.13 via INTRAVENOUS

## 2019-08-13 NOTE — Telephone Encounter (Signed)
[name] with [facility] calling with PET Scan results for patient- see below.   IMPRESSION: 1. Hypermetabolic nodule in the medial LEFT upper lobe is concerning for BRONCHOGENIC CARCINOMA. 2. No hypermetabolic mediastinal nodal metastasis. 3. Focal hypermetabolic activity in distal esophagus. Concern for a primary ESOPHAGEAL CARCINOMA. Recommend upper endoscopy for further evaluation. Of note, large sliding-type esophageal hernia.

## 2019-08-13 NOTE — Telephone Encounter (Signed)
Called to speak with pt/partner, not at home. Will try again later.

## 2019-08-13 NOTE — Telephone Encounter (Signed)
Osvaldo Angst returned Dr.G's call. Phone number [phone]. I let her know Dr.G will be back in the office tomorrow.

## 2019-08-14 NOTE — Telephone Encounter (Signed)
Spoke with partner. Will release results through mychart.  She will touch base with family and let me know next week what they decide to do - aware watchful waiting is an option given age and comorbidities.

## 2019-08-19 NOTE — Addendum Note (Signed)
Addended by: Ria Bush on: 08/19/2019 06:00 PM   Modules accepted: Orders

## 2019-08-19 NOTE — Telephone Encounter (Addendum)
Called [name] and discussed. She has spoken with patient's daughter. Patient with dementia.  They have decided to decline further evaluation of abnormal hypermetabolic lung nodule.  They would like to further evaluate abnormal hypermetabolic distal esophageal activity, keeping in mind he did see [facility] Dr [name] in 2016 for thickened esophagus at that time s/p EGD with stricture dilation, large HH and benign esophageal biopsies. He does continue PPI BID.  Would like local referral to [facility].  We discussed possible future direction of outpatient palliative care or hospice if needed. Patient largely asymptomatic at this time.

## 2019-08-19 NOTE — Telephone Encounter (Signed)
Pt's wife would like a call back at the end of the day. She said that you have been in contact with them before and she wants to speak with you please.

## 2019-08-20 NOTE — Telephone Encounter (Signed)
Spoke with [name], gave her [facility] information, placed on [employer]. [name] will call them to schedule .

## 2019-09-09 ENCOUNTER — Ambulatory Visit (INDEPENDENT_AMBULATORY_CARE_PROVIDER_SITE_OTHER): Payer: Medicare Other | Admitting: Gastroenterology

## 2019-09-09 ENCOUNTER — Telehealth: Payer: Self-pay

## 2019-09-09 ENCOUNTER — Encounter: Payer: Self-pay | Admitting: Gastroenterology

## 2019-09-09 ENCOUNTER — Other Ambulatory Visit: Payer: Self-pay

## 2019-09-09 VITALS — BP 116/81 | HR 76 | Temp 97.6°F | Ht 68.0 in | Wt 185.0 lb

## 2019-09-09 DIAGNOSIS — I63239 Cerebral infarction due to unspecified occlusion or stenosis of unspecified carotid arteries: Secondary | ICD-10-CM | POA: Diagnosis not present

## 2019-09-09 DIAGNOSIS — R948 Abnormal results of function studies of other organs and systems: Secondary | ICD-10-CM

## 2019-09-09 NOTE — Telephone Encounter (Signed)
   [facility] Pre-operative Risk Assessment    Request for surgical clearance:  1. What type of surgery is being performed? Upper endoscopy   2. When is this surgery scheduled? 09-18-19   3. What type of clearance is required (medical clearance vs. Pharmacy clearance to hold med vs. Both)? Both  4. Are there any medications that need to be held prior to surgery and how long? Plavix 75 mg, 5 day hold   5. Practice name and name of physician performing surgery? [facility],  Dr. [name]   6. What is your office phone number [phone]    7.   What is your office fax number [phone]  8.   Anesthesia type (None, local, MAC, general) ?  General   Stephen [name] 09/09/2019, 9:37 AM  _________________________________________________________________   (provider comments below)

## 2019-09-09 NOTE — Telephone Encounter (Signed)
Ok to hold Plavix 5 days before.

## 2019-09-09 NOTE — Progress Notes (Signed)
[name] MD, MRCP(U.K) [address]  [place]  [place], [place] [address]  Main: [phone]  Fax: [phone]   Gastroenterology Consultation  Referring Provider:     Ria Bush, MD Primary Care Physician:  Ria Bush, MD Primary Gastroenterologist:  Dr. [name]  Reason for Consultation:     Abnormal PET scan        HPI:   Stephen [name] is a 85 y.o. y/o male referred for consultation & management  by Dr. Ria Bush, MD.  He underwent a CT scan of the chest on 07/29/2019 to evaluate a pulmonary nodule which showed a 96mm lesion in the left upper lobe.  Subsequently had a PET scan on 08/13/2019 which demonstrated hypermetabolic nodule in the left upper lobe concerning for bronchogenic carcinoma.  In addition the other abnormality seen in the distal esophagus concerning for esophageal carcinoma.  He has hence been referred to see me to undergo an upper endoscopy.  He has mild dementia and was accompanied by his partner.  He has had an upper endoscopy in 2016 at [facility] and biopsies of the esophagus showed no abnormality.  I could not see the actual endoscopy report on epic but the biopsies are visible.  The patient denies any weight loss, dysphagia or issues with swallowing.  This was an incidental finding.  He is on Plavix.  He has had a stroke recently in May 2020.  Past Medical History:  Diagnosis Date  . Arthritis of knee   . BPH (benign prostatic hypertrophy)    w/ nocturia  . Coronary artery disease 03/2014   Inferior ST elevation myocardial infarction. Cardiac catheterization showed an occluded mid RCA. He had an angioplasty and drug-eluting stent placement with a 3.0 x 16 mm Promus drug-eluting stent. Ejection fraction was 45% by echo, completed cardiac rehab 06/2014  . CVA (cerebral vascular accident) ([place]) 03/2018  . Dementia ([place])    s/p stroke  . Diabetes type 2, controlled ([place])    Pt is taking Metformin  . Heart murmur    keeping an eye  on it but not treating  . History of basal cell cancer    s/p mohs  . History of colon polyps   . History of hiatal hernia 05/2018   this causes laryngeal spasms. pt was taking nitro for this when bp drops and he ends up with tia symptoms  . History of hypertension   . HLD (hyperlipidemia)    diet controlled in past  . Hypertension   . Insomnia    treated with multiple meds in past  . MI (myocardial infarction) ([place]) 2015  . Rosacea   . Skin cancer   . Sleep apnea    has cpap but does not use properly and does not like it!!  . Stroke ([place]) 03/2018   per cat scan, patient has had several strokes  . TIA (transient ischemic attack)    x 2 since CVA 03-2018  . UTI (urinary tract infection)     Past Surgical History:  Procedure Laterality Date  . CARDIAC CATHETERIZATION  03/2014   [facility];x1 stent  . CAROTID PTA/STENT INTERVENTION Right 05/2018   [facility]  . CAROTID PTA/STENT INTERVENTION Right 06/05/2018   Procedure: CAROTID PTA/STENT INTERVENTION;  Surgeon: Algernon Huxley, MD;  Location: [place] [place] [facility];  Service: Cardiovascular;  Laterality: Right;  . CATARACT EXTRACTION W/PHACO Left 10/09/2016   Procedure: CATARACT EXTRACTION PHACO AND INTRAOCULAR LENS PLACEMENT (IOC);  Surgeon: Birder Robson, MD;  Location: [facility];  Service: Ophthalmology;  Laterality: Left;  [place] 1.13 AP% 18.3 CDE 13.45 Fluid pack lot # [id number]  . COLONOSCOPY  08/2010   hyperplastic polyp, rec rpt 5 yrs  . ESOPHAGOGASTRODUODENOSCOPY  05/2015   dilated stricture, normal biopsies, HH, no definite infection [name] @ [facility])  . EYE SURGERY Left    cataract extraction  . MOHS SURGERY     basal cell chin/back  . REPLACEMENT TOTAL KNEE Left 08/2006  . VASECTOMY  1970    Prior to Admission medications   Medication Sig Start Date End Date Taking? Authorizing Provider  acetaminophen (TYLENOL) 500 MG tablet Take 1 tablet (500 mg total) by mouth 2 (two) times daily. 07/25/18   Ria Bush, MD  ARIPiprazole  (ABILIFY) 2 MG tablet Take 1 tablet by mouth daily. 05/25/19 06/24/19  [provider]  aspirin EC 81 MG tablet Take 81 mg by mouth every morning.    [provider]  clonazePAM (KLONOPIN) 1 MG tablet TAKE 1/2 TO 1 TABLET BY MOUTH AT BEDTIME 07/22/19   Ria Bush, MD  clopidogrel (PLAVIX) 75 MG tablet TAKE 1 TABLET (75 MG TOTAL) BY MOUTH AT BEDTIME. 07/17/19   Ria Bush, MD  diclofenac sodium (VOLTAREN) 1 % GEL Apply 2 g topically 3 (three) times daily.  09/01/18   [provider]  doxazosin (CARDURA) 1 MG tablet TAKE 1 TABLET BY MOUTH EVERY DAY 07/17/19   Ria Bush, MD  finasteride (PROSCAR) 5 MG tablet TAKE 1 TABLET BY MOUTH EVERY DAY 06/04/19   Ria Bush, MD  memantine (NAMENDA) 10 MG tablet TAKE 1 TABLET BY MOUTH TWICE A DAY 12/29/18   Ria Bush, MD  metFORMIN (GLUCOPHAGE) 500 MG tablet TAKE 1 TABLET (500 MG TOTAL) BY MOUTH 2 (TWO) TIMES DAILY WITH A MEAL. 02/05/19   Ria Bush, MD  Multiple Vitamin (MULTIVITAMIN WITH MINERALS) TABS tablet Take 1 tablet by mouth daily.    [provider]  pantoprazole (PROTONIX) 40 MG tablet TAKE 1 TABLET BY MOUTH TWICE A DAY 06/04/19   Ria Bush, MD  pravastatin (PRAVACHOL) 40 MG tablet TAKE 1 TABLET BY MOUTH EVERY DAY 02/12/19   Ria Bush, MD  vitamin B-12 (CYANOCOBALAMIN) 500 MCG tablet Take 500 mcg by mouth daily.    [provider]    Family History  Problem Relation Age of Onset  . Cancer Father 92       prostate and colon  . Cancer Mother        ovarian or uterine  . CAD Brother 44       MI, smoker  . Parkinson's disease Brother   . COPD Brother   . CAD Brother        MI  . Stroke Sister      Social History   Tobacco Use  . Smoking status: Former Smoker    Types: Cigarettes    Quit date: 11/12/1962    Years since quitting: 56.8  . Smokeless tobacco: Never Used  . Tobacco comment: Quit 1964  Substance Use Topics  . Alcohol use: Yes     Alcohol/week: 1.0 standard drinks    Types: 1 Cans of beer per week    Comment: rarely  . Drug use: No    Allergies as of 09/09/2019 - Review Complete 08/13/2019  Allergen Reaction Noted  . Aricept [donepezil hcl] Diarrhea 02/28/2018  . Belsomra [suvorexant] Other (See Comments) 11/29/2014  . Vancomycin Itching, Rash, and Other (See Comments) 06/05/2018  . Ambien [zolpidem  tartrate] Other (See Comments) 10/03/2016  . Atorvastatin  07/02/2014  . Doxycycline Rash 04/01/2014  . Penicillins Hives 07/07/2013  . Tamsulosin Other (See Comments) 10/23/2012    Review of Systems:    All systems reviewed and negative except where noted in HPI.   Physical Exam:  There were no vitals taken for this visit. No LMP for male patient. Psych:  Alert and cooperative. Normal mood and affect. General:   Alert,  Well-developed, well-nourished, pleasant and cooperative in NAD Head:  Normocephalic and atraumatic. Eyes:  Sclera clear, no icterus.   Conjunctiva pink. Ears:  Normal auditory acuity. Nose:  No deformity, discharge, or lesions. Mouth:  No deformity or lesions,oropharynx pink & moist. Neck:  Supple; no masses or thyromegaly. Lungs:  Respirations even and unlabored.  Clear throughout to auscultation.   No wheezes, crackles, or rhonchi. No acute distress. Heart:  Regular rate and rhythm; no murmurs, clicks, rubs, or gallops. Abdomen:  Normal bowel sounds.  No bruits.  Soft, non-tender and non-distended without masses, hepatosplenomegaly or hernias noted.  No guarding or rebound tenderness.    Neurologic:  Alert and oriented x3;  grossly normal neurologically. Skin:  Intact without significant lesions or rashes. No jaundice. Lymph Nodes:  No significant cervical adenopathy. Psych:  Alert and cooperative. Normal mood and affect.  Imaging Studies: Nm Pet Image Initial (pi) Skull Base To Thigh  Result Date: 08/13/2019 CLINICAL DATA:  Initial treatment strategy for lung mass. EXAM: NUCLEAR  MEDICINE PET SKULL BASE TO THIGH TECHNIQUE: 10.1 mCi F-18 FDG was injected intravenously. Full-ring PET imaging was performed from the skull base to thigh after the radiotracer. CT data was obtained and used for attenuation correction and anatomic localization. Fasting blood glucose: 96 mg/dl COMPARISON:  CT 07/29/2019 FINDINGS: Mediastinal blood pool activity: SUV max 2.8 Liver activity: SUV max NA NECK: Incidental CT findings: none CHEST: The medial aspect of the LEFT upper lobe hypermetabolic nodule measures 2.1 x 1.2 cm SUV max equal 7.4. There is focal hypermetabolic activity within the distal esophagus a just superior to the GE junction with SUV max equal 9.7. There is potential mild thickening of the esophageal wall at this level 21.3 cm (image 97/3. Moderate hiatal hernia noted. No paraesophageal metastatic adenopathy. No hypermetabolic mediastinal lymph nodes. Incidental CT findings: none ABDOMEN/PELVIS: No abnormal hypermetabolic activity within the liver, pancreas, adrenal glands, or spleen. No hypermetabolic lymph nodes in the abdomen or pelvis. Intense metabolic activity associated with the RIGHT colon is favored a benign. Intense activity activity in the small bowel is also favored benign. Incidental CT findings: Atherosclerotic calcification of the aorta. SKELETON: No focal hypermetabolic activity to suggest skeletal metastasis. Incidental CT findings: none IMPRESSION: 1. Hypermetabolic nodule in the medial LEFT upper lobe is concerning for BRONCHOGENIC CARCINOMA. 2. No hypermetabolic mediastinal nodal metastasis. 3. Focal hypermetabolic activity in distal esophagus. Concern for a primary ESOPHAGEAL CARCINOMA. Recommend upper endoscopy for further evaluation. Of note, large sliding-type esophageal hernia. Electronically Signed   By: Suzy Bouchard M.D.   On: 08/13/2019 11:20    Assessment and Plan:   Stephen [name] is a 6 y.o. y/o male has been referred for abnormal findings of focal  hypermetabolic activity in the distal esophagus concerning for esophageal neoplasm requiring an endoscopy.  Plan  1. EGD with Plavix holding instructions  I have discussed alternative options, risks & benefits,  which include, but are not limited to, bleeding, infection, perforation,respiratory complication & drug reaction.  The patient agrees with this plan &  written consent will be obtained.     Follow up PRN  Dr [name] MD,MRCP(U.K)

## 2019-09-09 NOTE — Telephone Encounter (Signed)
Dr. Fletcher Anon Please see below. Is it ok to hold Plavix 5 days for the EGD? Thanks, Richardson Dopp, PA-C    09/09/2019 10:29 AM

## 2019-09-11 NOTE — Telephone Encounter (Signed)
Pt has been notified and agrees to hold Plavix 5 days prior to endoscopy.

## 2019-09-15 ENCOUNTER — Other Ambulatory Visit: Payer: Self-pay

## 2019-09-15 ENCOUNTER — Other Ambulatory Visit
Admission: RE | Admit: 2019-09-15 | Discharge: 2019-09-15 | Disposition: A | Discharge status: Home / Self Care | Payer: Medicare Other | Source: Ambulatory Visit | Attending: Gastroenterology | Admitting: Gastroenterology

## 2019-09-15 DIAGNOSIS — Z20828 Contact with and (suspected) exposure to other viral communicable diseases: Secondary | ICD-10-CM | POA: Insufficient documentation

## 2019-09-15 DIAGNOSIS — Z01812 Encounter for preprocedural laboratory examination: Secondary | ICD-10-CM | POA: Insufficient documentation

## 2019-09-15 LAB — SARS CORONAVIRUS 2 (TAT 6-24 HRS): SARS Coronavirus 2: NEGATIVE

## 2019-09-15 NOTE — Telephone Encounter (Signed)
   Primary Cardiologist: Kathlyn Sacramento, MD  Chart reviewed as part of pre-operative protocol coverage. Given past medical history and time since last visit, based on ACC/AHA guidelines, Stephen [name] would be at acceptable risk for the planned procedure without further cardiovascular testing.   Per Dr. Fletcher Anon, it will be acceptable to hold Plavix 5 days prior to procedure then resume when ok from a procedural standpoint.   I will route this recommendation to the requesting party via Epic fax function and remove from pre-op pool.  Please call with questions.  Kathyrn Drown, NP 09/15/2019, 9:55 AM

## 2019-09-17 ENCOUNTER — Other Ambulatory Visit: Payer: Self-pay | Admitting: Family Medicine

## 2019-09-17 NOTE — Telephone Encounter (Signed)
Last office visit 06/02/2019 for [place].  Last refilled 07/22/2019 for #30 with 1 refill.  Next Appt: 12/01/2019 for 3 month follow up.

## 2019-09-18 ENCOUNTER — Ambulatory Visit
Admission: RE | Admit: 2019-09-18 | Discharge: 2019-09-18 | Disposition: A | Discharge status: Home / Self Care | Payer: Medicare Other | Attending: Gastroenterology | Admitting: Gastroenterology

## 2019-09-18 ENCOUNTER — Telehealth: Payer: Self-pay | Admitting: Family Medicine

## 2019-09-18 ENCOUNTER — Ambulatory Visit: Payer: Medicare Other | Admitting: Certified Registered"

## 2019-09-18 ENCOUNTER — Encounter
Admission: RE | Disposition: A | Discharge status: Home / Self Care | Payer: Self-pay | Source: Home / Self Care | Attending: Gastroenterology

## 2019-09-18 ENCOUNTER — Other Ambulatory Visit: Payer: Self-pay

## 2019-09-18 ENCOUNTER — Encounter: Payer: Self-pay | Admitting: Anesthesiology

## 2019-09-18 DIAGNOSIS — I129 Hypertensive chronic kidney disease with stage 1 through stage 4 chronic kidney disease, or unspecified chronic kidney disease: Secondary | ICD-10-CM | POA: Diagnosis not present

## 2019-09-18 DIAGNOSIS — Z79899 Other long term (current) drug therapy: Secondary | ICD-10-CM | POA: Insufficient documentation

## 2019-09-18 DIAGNOSIS — Z8673 Personal history of transient ischemic attack (TIA), and cerebral infarction without residual deficits: Secondary | ICD-10-CM | POA: Diagnosis not present

## 2019-09-18 DIAGNOSIS — Z7982 Long term (current) use of aspirin: Secondary | ICD-10-CM | POA: Diagnosis not present

## 2019-09-18 DIAGNOSIS — Z8719 Personal history of other diseases of the digestive system: Secondary | ICD-10-CM | POA: Diagnosis not present

## 2019-09-18 DIAGNOSIS — R948 Abnormal results of function studies of other organs and systems: Secondary | ICD-10-CM | POA: Diagnosis not present

## 2019-09-18 DIAGNOSIS — G4730 Sleep apnea, unspecified: Secondary | ICD-10-CM | POA: Diagnosis not present

## 2019-09-18 DIAGNOSIS — Z85828 Personal history of other malignant neoplasm of skin: Secondary | ICD-10-CM | POA: Insufficient documentation

## 2019-09-18 DIAGNOSIS — F0390 Unspecified dementia without behavioral disturbance: Secondary | ICD-10-CM | POA: Insufficient documentation

## 2019-09-18 DIAGNOSIS — N183 Chronic kidney disease, stage 3 unspecified: Secondary | ICD-10-CM | POA: Diagnosis not present

## 2019-09-18 DIAGNOSIS — Z87891 Personal history of nicotine dependence: Secondary | ICD-10-CM | POA: Diagnosis not present

## 2019-09-18 DIAGNOSIS — I252 Old myocardial infarction: Secondary | ICD-10-CM | POA: Diagnosis not present

## 2019-09-18 DIAGNOSIS — I10 Essential (primary) hypertension: Secondary | ICD-10-CM | POA: Insufficient documentation

## 2019-09-18 DIAGNOSIS — K449 Diaphragmatic hernia without obstruction or gangrene: Secondary | ICD-10-CM | POA: Diagnosis not present

## 2019-09-18 DIAGNOSIS — Z7902 Long term (current) use of antithrombotics/antiplatelets: Secondary | ICD-10-CM | POA: Insufficient documentation

## 2019-09-18 DIAGNOSIS — F0150 Vascular dementia without behavioral disturbance: Secondary | ICD-10-CM

## 2019-09-18 DIAGNOSIS — N400 Enlarged prostate without lower urinary tract symptoms: Secondary | ICD-10-CM | POA: Diagnosis not present

## 2019-09-18 DIAGNOSIS — E119 Type 2 diabetes mellitus without complications: Secondary | ICD-10-CM | POA: Insufficient documentation

## 2019-09-18 DIAGNOSIS — G4700 Insomnia, unspecified: Secondary | ICD-10-CM | POA: Insufficient documentation

## 2019-09-18 DIAGNOSIS — E785 Hyperlipidemia, unspecified: Secondary | ICD-10-CM | POA: Insufficient documentation

## 2019-09-18 DIAGNOSIS — K228 Other specified diseases of esophagus: Secondary | ICD-10-CM

## 2019-09-18 DIAGNOSIS — I2510 Atherosclerotic heart disease of native coronary artery without angina pectoris: Secondary | ICD-10-CM | POA: Insufficient documentation

## 2019-09-18 DIAGNOSIS — Z7984 Long term (current) use of oral hypoglycemic drugs: Secondary | ICD-10-CM | POA: Diagnosis not present

## 2019-09-18 DIAGNOSIS — R933 Abnormal findings on diagnostic imaging of other parts of digestive tract: Secondary | ICD-10-CM

## 2019-09-18 DIAGNOSIS — E1122 Type 2 diabetes mellitus with diabetic chronic kidney disease: Secondary | ICD-10-CM | POA: Diagnosis not present

## 2019-09-18 DIAGNOSIS — R918 Other nonspecific abnormal finding of lung field: Secondary | ICD-10-CM

## 2019-09-18 DIAGNOSIS — Z8249 Family history of ischemic heart disease and other diseases of the circulatory system: Secondary | ICD-10-CM | POA: Diagnosis not present

## 2019-09-18 DIAGNOSIS — K2289 Other specified disease of esophagus: Secondary | ICD-10-CM

## 2019-09-18 HISTORY — PX: ESOPHAGOGASTRODUODENOSCOPY (EGD) WITH PROPOFOL: SHX5813

## 2019-09-18 LAB — GLUCOSE, CAPILLARY: Glucose-Capillary: 92 mg/dL (ref 70–99)

## 2019-09-18 SURGERY — ESOPHAGOGASTRODUODENOSCOPY (EGD) WITH PROPOFOL
Anesthesia: General

## 2019-09-18 MED ORDER — PROPOFOL 500 MG/50ML IV EMUL
INTRAVENOUS | Status: DC | PRN
  Administered 2019-09-18: 125 ug/kg/min via INTRAVENOUS

## 2019-09-18 MED ORDER — PROPOFOL 10 MG/ML IV BOLUS
INTRAVENOUS | Status: DC | PRN
  Administered 2019-09-18: 50 mg via INTRAVENOUS

## 2019-09-18 MED ORDER — PHENYLEPHRINE HCL (PRESSORS) 10 MG/ML IV SOLN
INTRAVENOUS | Status: DC | PRN
  Administered 2019-09-18: 100 ug via INTRAVENOUS

## 2019-09-18 MED ORDER — LIDOCAINE HCL (CARDIAC) PF 100 MG/5ML IV SOSY
PREFILLED_SYRINGE | INTRAVENOUS | Status: DC | PRN
  Administered 2019-09-18: 100 mg via INTRATRACHEAL

## 2019-09-18 MED ORDER — SODIUM CHLORIDE 0.9 % IV SOLN
INTRAVENOUS | Status: DC
  Administered 2019-09-18: 09:00:00 via INTRAVENOUS
  Administered 2019-09-18: 1000 mL via INTRAVENOUS

## 2019-09-18 NOTE — Transfer of Care (Signed)
Immediate Anesthesia Transfer of Care Note  Patient: Stephen [name]  Procedure(s) Performed: ESOPHAGOGASTRODUODENOSCOPY (EGD) WITH PROPOFOL (N/A )  Patient Location: Endoscopy Unit  Anesthesia Type:General  Level of Consciousness: drowsy, patient cooperative and responds to stimulation  Airway & Oxygen Therapy: Patient Spontanous Breathing and Patient connected to face mask oxygen  Post-op Assessment: Report given to RN and Post -op Vital signs reviewed and stable  Post vital signs: Reviewed and stable  Last Vitals:  Vitals Value Taken Time  BP 121/80 09/18/19 0939  Temp 35.8 C 09/18/19 0938  Pulse 62 09/18/19 0942  Resp 9 09/18/19 0942  SpO2 99 % 09/18/19 0942  Vitals shown include unvalidated device data.  Last Pain:  Vitals:   09/18/19 0938  TempSrc: Temporal  PainSc: Asleep         Complications: No apparent anesthesia complications

## 2019-09-18 NOTE — Anesthesia Post-op Follow-up Note (Signed)
Anesthesia QCDR form completed.        

## 2019-09-18 NOTE — Addendum Note (Signed)
Addended by: Ria Bush on: 09/18/2019 05:25 PM   Modules accepted: Orders

## 2019-09-18 NOTE — Anesthesia Preprocedure Evaluation (Signed)
Anesthesia Evaluation  Patient identified by MRN, date of birth, ID band Patient confused    Reviewed: Allergy & Precautions, H&P , NPO status , Patient's Chart, lab work & pertinent test results  History of Anesthesia Complications Negative for: history of anesthetic complications  Airway Mallampati: III  TM Distance: <3 FB Neck ROM: limited    Dental  (+) Chipped, Poor Dentition, Missing, Edentulous Lower   Pulmonary neg shortness of breath, sleep apnea , Patient abstained from smoking., former smoker,           Cardiovascular Exercise Tolerance: Good hypertension, (-) angina+ CAD and + Past MI  (-) DOE + Valvular Problems/Murmurs      Neuro/Psych PSYCHIATRIC DISORDERS TIACVA, Residual Symptoms    GI/Hepatic Neg liver ROS, hiatal hernia, Medicated and Controlled,  Endo/Other  diabetes, Type 2  Renal/GU Renal disease  negative genitourinary   Musculoskeletal  (+) Arthritis ,   Abdominal   Peds  Hematology negative hematology ROS (+)   Anesthesia Other Findings Past Medical History: No date: Arthritis of knee No date: BPH (benign prostatic hypertrophy)     Comment:  w/ nocturia 03/2014: Coronary artery disease     Comment:  Inferior ST elevation myocardial infarction. Cardiac               catheterization showed an occluded mid RCA. He had an               angioplasty and drug-eluting stent placement with a 3.0 x              16 mm Promus drug-eluting stent. Ejection fraction was               45% by echo, completed cardiac rehab 06/2014 03/2018: CVA (cerebral vascular accident) [facility]) No date: Dementia ([place])     Comment:  s/p stroke No date: Diabetes type 2, controlled ([place])     Comment:  Pt is taking Metformin No date: Heart murmur     Comment:  keeping an eye on it but not treating No date: History of basal cell cancer     Comment:  s/p mohs No date: History of colon polyps 05/2018: History of hiatal  hernia     Comment:  this causes laryngeal spasms. pt was taking nitro for               this when bp drops and he ends up with tia symptoms No date: History of hypertension No date: HLD (hyperlipidemia)     Comment:  diet controlled in past No date: Hypertension No date: Insomnia     Comment:  treated with multiple meds in past 2015: MI (myocardial infarction) ([place]) No date: Rosacea No date: Skin cancer No date: Sleep apnea     Comment:  has cpap but does not use properly and does not like               it!! 03/2018: Stroke [facility])     Comment:  per cat scan, patient has had several strokes No date: TIA (transient ischemic attack)     Comment:  x 2 since CVA 03-2018 No date: UTI (urinary tract infection)  Past Surgical History: 03/2014: CARDIAC CATHETERIZATION     Comment:  [facility];x1 stent 05/2018: CAROTID PTA/STENT INTERVENTION; Right     Comment:  [facility] 06/05/2018: CAROTID PTA/STENT INTERVENTION; Right     Comment:  Procedure: CAROTID PTA/STENT INTERVENTION;  Surgeon:  Algernon Huxley, MD;  Location: [place] CV [facility];                Service: Cardiovascular;  Laterality: Right; 10/09/2016: CATARACT EXTRACTION W/PHACO; Left     Comment:  Procedure: CATARACT EXTRACTION PHACO AND INTRAOCULAR               LENS PLACEMENT (IOC);  Surgeon: Birder Robson, MD;                Location: [facility] ORS;  Service: Ophthalmology;  Laterality:              Left;  [place] 1.13 AP% 18.3 CDE 13.45 Fluid pack lot #               [id number] 08/2010: COLONOSCOPY     Comment:  hyperplastic polyp, rec rpt 5 yrs 05/2015: ESOPHAGOGASTRODUODENOSCOPY     Comment:  dilated stricture, normal biopsies, HH, no definite               infection [name] @ [facility]) No date: EYE SURGERY; Left     Comment:  cataract extraction No date: MOHS SURGERY     Comment:  basal cell chin/back 08/2006: REPLACEMENT TOTAL KNEE; Left 1970: VASECTOMY  BMI    Body Mass Index: 29.97 kg/m       Reproductive/Obstetrics negative OB ROS                             Anesthesia Physical Anesthesia Plan  ASA: III  Anesthesia Plan: General   Post-op Pain Management:    Induction: Intravenous  PONV Risk Score and Plan: Propofol infusion and TIVA  Airway Management Planned: Natural Airway and Nasal Cannula  Additional Equipment:   Intra-op Plan:   Post-operative Plan:   Informed Consent: I have reviewed the patients History and Physical, chart, labs and discussed the procedure including the risks, benefits and alternatives for the proposed anesthesia with the patient or authorized representative who has indicated his/her understanding and acceptance.   Patient has DNR.  Discussed DNR with power of attorney and Discussed DNR with patient.   Dental Advisory Given  Plan Discussed with: Anesthesiologist, CRNA and Surgeon  Anesthesia Plan Comments: (Wife consented for risks of anesthesia including but not limited to:  - adverse reactions to medications - risk of intubation if required - damage to teeth, lips or other oral mucosa - sore throat or hoarseness - Damage to heart, brain, lungs or loss of life  Wife voiced understanding.)        Anesthesia Quick Evaluation

## 2019-09-18 NOTE — H&P (Signed)
Stephen Bellows, MD [address] [place], [place], [place], [address] [address], [place], [place], [place], [address] Phone: [phone]  Fax: [phone]  Primary Care Physician:  Stephen Bush, MD   Pre-Procedure History & Physical: HPI:  Stephen [name] is a 85 y.o. male is here for an endoscopy    Past Medical History:  Diagnosis Date  . Arthritis of knee   . BPH (benign prostatic hypertrophy)    w/ nocturia  . Coronary artery disease 03/2014   Inferior ST elevation myocardial infarction. Cardiac catheterization showed an occluded mid RCA. He had an angioplasty and drug-eluting stent placement with a 3.0 x 16 mm Promus drug-eluting stent. Ejection fraction was 45% by echo, completed cardiac rehab 06/2014  . CVA (cerebral vascular accident) ([place]) 03/2018  . Dementia ([place])    s/p stroke  . Diabetes type 2, controlled ([place])    Pt is taking Metformin  . Heart murmur    keeping an eye on it but not treating  . History of basal cell cancer    s/p mohs  . History of colon polyps   . History of hiatal hernia 05/2018   this causes laryngeal spasms. pt was taking nitro for this when bp drops and he ends up with tia symptoms  . History of hypertension   . HLD (hyperlipidemia)    diet controlled in past  . Hypertension   . Insomnia    treated with multiple meds in past  . MI (myocardial infarction) ([place]) 2015  . Rosacea   . Skin cancer   . Sleep apnea    has cpap but does not use properly and does not like it!!  . Stroke ([place]) 03/2018   per cat scan, patient has had several strokes  . TIA (transient ischemic attack)    x 2 since CVA 03-2018  . UTI (urinary tract infection)     Past Surgical History:  Procedure Laterality Date  . CARDIAC CATHETERIZATION  03/2014   [facility];x1 stent  . CAROTID PTA/STENT INTERVENTION Right 05/2018   [facility]  . CAROTID PTA/STENT INTERVENTION Right 06/05/2018   Procedure: CAROTID PTA/STENT INTERVENTION;  Surgeon: Stephen Huxley, MD;  Location: [place] CV LAB;  Service: Cardiovascular;  Laterality: Right;  . CATARACT EXTRACTION W/PHACO Left 10/09/2016   Procedure: CATARACT EXTRACTION PHACO AND INTRAOCULAR LENS PLACEMENT (IOC);  Surgeon: Stephen Robson, MD;  Location: [facility] ORS;  Service: Ophthalmology;  Laterality: Left;  [place] 1.13 AP% 18.3 CDE 13.45 Fluid pack lot # [id number]  . COLONOSCOPY  08/2010   hyperplastic polyp, rec rpt 5 yrs  . ESOPHAGOGASTRODUODENOSCOPY  05/2015   dilated stricture, normal biopsies, HH, no definite infection Stephen [name] @ [facility])  . EYE SURGERY Left    cataract extraction  . MOHS SURGERY     basal cell chin/back  . REPLACEMENT TOTAL KNEE Left 08/2006  . VASECTOMY  1970    Prior to Admission medications   Medication Sig Start Date End Date Taking? Authorizing Provider  acetaminophen (TYLENOL) 500 MG tablet Take 1 tablet (500 mg total) by mouth 2 (two) times daily. 07/25/18  Yes Stephen Bush, MD  aspirin EC 81 MG tablet Take 81 mg by mouth every morning.   Yes [provider]  clonazePAM (KLONOPIN) 1 MG tablet TAKE 1/2 TO 1 TABLET BY MOUTH AT BEDTIME 07/22/19  Yes Stephen Bush, MD  clopidogrel (PLAVIX) 75 MG tablet TAKE 1 TABLET (75 MG TOTAL) BY  MOUTH AT BEDTIME. 07/17/19  Yes Stephen Bush, MD  diclofenac sodium (VOLTAREN) 1 % GEL Apply 2 g topically 3 (three) times daily.  09/01/18  Yes [provider]  finasteride (PROSCAR) 5 MG tablet TAKE 1 TABLET BY MOUTH EVERY DAY 06/04/19  Yes Stephen Bush, MD  memantine (NAMENDA) 10 MG tablet TAKE 1 TABLET BY MOUTH TWICE A DAY 12/29/18  Yes Stephen Bush, MD  metFORMIN (GLUCOPHAGE) 500 MG tablet TAKE 1 TABLET (500 MG TOTAL) BY MOUTH 2 (TWO) TIMES DAILY WITH A MEAL. 02/05/19  Yes Stephen Bush, MD  Multiple Vitamin (MULTIVITAMIN WITH MINERALS) TABS tablet Take 1 tablet by mouth daily.   Yes [provider]  pantoprazole (PROTONIX) 40 MG tablet TAKE 1 TABLET BY MOUTH TWICE A DAY 06/04/19  Yes Stephen Bush, MD  pravastatin (PRAVACHOL) 40 MG tablet TAKE 1 TABLET BY MOUTH EVERY DAY 02/12/19  Yes Stephen Bush, MD  vitamin B-12 (CYANOCOBALAMIN) 500 MCG tablet Take 500 mcg by mouth daily.   Yes [provider]  ARIPiprazole (ABILIFY) 2 MG tablet Take 1 tablet by mouth daily. 05/25/19 06/24/19  [provider]  doxazosin (CARDURA) 1 MG tablet TAKE 1 TABLET BY MOUTH EVERY DAY Patient not taking: Reported on 09/09/2019 07/17/19   Stephen Bush, MD    Allergies as of 09/09/2019 - Review Complete 09/09/2019  Allergen Reaction Noted  . Aricept [donepezil hcl] Diarrhea 02/28/2018  . Belsomra [suvorexant] Other (See Comments) 11/29/2014  . Vancomycin Itching, Rash, and Other (See Comments) 06/05/2018  . Ambien [zolpidem tartrate] Other (See Comments) 10/03/2016  . Atorvastatin  07/02/2014  . Doxycycline Rash 04/01/2014  . Penicillins Hives 07/07/2013  . Tamsulosin Other (See Comments) 10/23/2012    Family History  Problem Relation Age of Onset  . Cancer Father 67       prostate and colon  . Cancer Mother        ovarian or uterine  . CAD Brother 32       MI, smoker  . Parkinson's disease Brother   . COPD Brother   . CAD Brother        MI  . Stroke Sister     Social History   Socioeconomic History  . Marital status: Widowed    Spouse name: Not on file  . Number of children: Not on file  . Years of education: Not on file  . Highest education level: Not on file  Occupational History  . Not on file  Social Needs  . Financial resource strain: Not on file  . Food insecurity    Worry: Not on file    Inability: Not on file  . Transportation needs    Medical: Not on file    Non-medical: Not on file  Tobacco Use  . Smoking status: Former Smoker    Types: Cigarettes    Quit date: 11/12/1962    Years since quitting: 56.8  . Smokeless tobacco: Never Used  . Tobacco comment: Quit 1964  Substance and Sexual Activity  . Alcohol use: Yes    Alcohol/week: 1.0  standard drinks    Types: 1 Cans of beer per week    Comment: rarely  . Drug use: No  . Sexual activity: Never  Lifestyle  . Physical activity    Days per week: Not on file    Minutes per session: Not on file  . Stress: Not on file  Relationships  . Social connections    Talks on phone: Not on file  Gets together: Not on file    Attends religious service: Not on file    Active member of club or organization: Not on file    Attends meetings of clubs or organizations: Not on file    Relationship status: Not on file  . Intimate partner violence    Fear of current or ex partner: Not on file    Emotionally abused: Not on file    Physically abused: Not on file    Forced sexual activity: Not on file  Other Topics Concern  . Not on file  Social History Narrative   Lives at [facility] with friend - Osvaldo Angst RN.   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 Vegetarian.    Review of Systems: See HPI, otherwise negative ROS  Physical Exam: BP 129/80   Pulse 70   Temp (!) 97.1 F (36.2 C) (Tympanic)   Resp 20   Ht 5' 8.5" (1.74 m)   Wt 90.7 kg   SpO2 100%   BMI 29.97 kg/m  General:   Alert,  pleasant and cooperative in NAD Head:  Normocephalic and atraumatic. Neck:  Supple; no masses or thyromegaly. Lungs:  Clear throughout to auscultation, normal respiratory effort.    Heart:  +S1, +S2, Regular rate and rhythm, No edema. Abdomen:  Soft, nontender and nondistended. Normal bowel sounds, without guarding, and without rebound.   Neurologic:  Alert and  oriented x4;  grossly normal neurologically.  Impression/Plan: Hyun H Chiles is here for an endoscopy  to be performed for  evaluation of abnormal findings on ct scan     Risks, benefits, limitations, and alternatives regarding endoscopy have been reviewed  with the patient.  Questions have been answered.  All parties agreeable.   Stephen Bellows, MD  09/18/2019, 9:19 AM

## 2019-09-18 NOTE — Op Note (Signed)
[facility] Gastroenterology Patient Name: Stephen [name] Procedure Date: 09/18/2019 9:28 AM MRN: [id number] Account #: [id number] Date of Birth: Nov 18, 1926 Admit Type: Outpatient Age: 85 Room: [facility] ENDO ROOM 4 Gender: Male Note Status: Finalized Procedure:             Upper GI endoscopy Indications:           Abnormal CT of the GI tract Providers:             [name] MD, MD Referring MD:          Ria Bush (Referring MD) Medicines:             Monitored Anesthesia Care Complications:         No immediate complications. Procedure:             Pre-Anesthesia Assessment:                        - Prior to the procedure, a History and Physical was                         performed, and patient medications, allergies and                         sensitivities were reviewed. The patient's tolerance                         of previous anesthesia was reviewed.                        - The risks and benefits of the procedure and the                         sedation options and risks were discussed with the                         patient. All questions were answered and informed                         consent was obtained.                        - After reviewing the risks and benefits, the patient                         was deemed in satisfactory condition to undergo the                         procedure.                        - ASA Grade Assessment: III - A patient with severe                         systemic disease.                        After obtaining informed consent, the endoscope was                         passed under direct vision. Throughout the procedure,  the patient's blood pressure, pulse, and oxygen                         saturations were monitored continuously. The Endoscope                         was introduced through the mouth, and advanced to the                         third part of duodenum. The upper GI  endoscopy was                         accomplished with ease. The patient tolerated the                         procedure well. Findings:      The examined duodenum was normal.      A large hiatal hernia was present.      The esophagus was normal.      The cardia and gastric fundus were normal on retroflexion. Impression:            - Normal examined duodenum.                        - Large hiatal hernia.                        - Normal esophagus.                        - No specimens collected. Recommendation:        - Discharge patient to home (with escort).                        - Resume previous diet.                        - Continue present medications.                        - 1. Urgent referral to oncology to discuss at Tumor                         board- no lesioon seen on endoscopy? lesion in the                         wall vs extra luminal - suggest EUS as next step of                         evaluation based on tumor board evaluation Procedure Code(s):     --- Professional ---                        [phone], Esophagogastroduodenoscopy, flexible,                         transoral; diagnostic, including collection of                         specimen(s) by brushing or washing, when performed                         (  separate procedure) Diagnosis Code(s):     --- Professional ---                        K44.9, Diaphragmatic hernia without obstruction or                         gangrene                        R93.3, Abnormal findings on diagnostic imaging of                         other parts of digestive tract CPT copyright 2019 American Medical Association. All rights reserved. The codes documented in this report are preliminary and upon coder review may  be revised to meet current compliance requirements. [name], MD [name] MD, MD 09/18/2019 9:38:02 AM This report has been signed electronically. Number of Addenda: 0 Note Initiated On: 09/18/2019 9:28 AM Estimated  Blood Loss:  Estimated blood loss: none.      [facility]

## 2019-09-18 NOTE — Telephone Encounter (Signed)
Stephen [name] called.  Patient had an endoscopy done today and it was clear.  Patient had a PET scan done a couple weeks ago and Dr.Anna wanst an urgent referral to oncology.  The family would rather patient had Palliative Care.  Patient is 76 and several other problems and they'd like to discuss this with Dr.G. Stephen [name] is requesting a call back today.

## 2019-09-18 NOTE — Telephone Encounter (Addendum)
Appreciate GI assistance with EGD. He tolerated very well.  Spoke with [name] longterm partner. She has spoken with his daughter as well. They have been contacted to schedule oncology appointment. They are pretty sure they don't want further invasive intervention including EUS as given age and comorbidities, the family would not want him to undergo chemotherapy at this time. Discussed pros/cons of seeing onc - will keep appt for their evaluation (albeit limited by lack of tissue diagnosis).  Will place palliative care referral.

## 2019-09-18 NOTE — Telephone Encounter (Signed)
ERx 

## 2019-09-18 NOTE — Anesthesia Postprocedure Evaluation (Signed)
Anesthesia Post Note  Patient: Stephen [name]  Procedure(s) Performed: ESOPHAGOGASTRODUODENOSCOPY (EGD) WITH PROPOFOL (N/A )  Patient location during evaluation: Endoscopy Anesthesia Type: General Level of consciousness: awake and alert Pain management: pain level controlled Vital Signs Assessment: post-procedure vital signs reviewed and stable Respiratory status: spontaneous breathing, nonlabored ventilation, respiratory function stable and patient connected to nasal cannula oxygen Cardiovascular status: blood pressure returned to baseline and stable Postop Assessment: no apparent nausea or vomiting Anesthetic complications: no     Last Vitals:  Vitals:   09/18/19 1010 09/18/19 1013  BP: 136/89   Pulse: 71 81  Resp: 12 (!) 23  Temp:    SpO2: 97% 99%    Last Pain:  Vitals:   09/18/19 1013  TempSrc:   PainSc: 0-No pain                 Precious Haws Piscitello

## 2019-09-21 ENCOUNTER — Other Ambulatory Visit: Payer: Self-pay | Admitting: Family Medicine

## 2019-09-21 ENCOUNTER — Telehealth: Payer: Self-pay

## 2019-09-21 NOTE — Telephone Encounter (Signed)
Stephen [name] notified that Palliative Care Referral was sent to [facility] office of Hospice and Palliative Care. Spoke with [name] at [name].

## 2019-09-21 NOTE — Telephone Encounter (Signed)
Thank you for the update  I agree with the patient's decision if that is what he wants.  If he wishes to know more about the underlying lesion if it were to be a form of cancer or not then EUS would be warranted.  Probably he could have a discussion with the oncologist and then make up his mind as to what he wants to be done or not done.  Regards  Oz Gammel

## 2019-09-21 NOTE — Telephone Encounter (Signed)
[facility] Last rx:  12/29/18, #180/2 Last OV:  06/02/19, AWV Next OV:  12/01/19, 6 mo f/u

## 2019-09-21 NOTE — Telephone Encounter (Signed)
Telephone call to patient's significant caregiver [name].  [name] states this is not a good week as it's patients birthday week.  [name] states it is not a emergency for palliative care to visit this week.  [name] in agreement with palliative care visit on Monday 09-28-19 at 11:00 AM.

## 2019-09-22 ENCOUNTER — Other Ambulatory Visit: Payer: Self-pay

## 2019-09-22 ENCOUNTER — Encounter: Payer: Self-pay | Admitting: Gastroenterology

## 2019-09-22 ENCOUNTER — Other Ambulatory Visit: Payer: Self-pay | Admitting: Family Medicine

## 2019-09-23 ENCOUNTER — Inpatient Hospital Stay: Payer: Medicare Other | Attending: Oncology | Admitting: Oncology

## 2019-09-23 ENCOUNTER — Encounter: Payer: Self-pay | Admitting: Oncology

## 2019-09-23 ENCOUNTER — Other Ambulatory Visit: Payer: Self-pay

## 2019-09-23 VITALS — BP 142/82 | HR 66 | Temp 95.0°F | Resp 16 | Wt 183.5 lb

## 2019-09-23 DIAGNOSIS — F0150 Vascular dementia without behavioral disturbance: Secondary | ICD-10-CM | POA: Insufficient documentation

## 2019-09-23 DIAGNOSIS — Z800 Family history of malignant neoplasm of digestive organs: Secondary | ICD-10-CM

## 2019-09-23 DIAGNOSIS — R911 Solitary pulmonary nodule: Secondary | ICD-10-CM | POA: Diagnosis not present

## 2019-09-23 DIAGNOSIS — Z809 Family history of malignant neoplasm, unspecified: Secondary | ICD-10-CM | POA: Diagnosis not present

## 2019-09-23 DIAGNOSIS — Z8042 Family history of malignant neoplasm of prostate: Secondary | ICD-10-CM

## 2019-09-23 DIAGNOSIS — K449 Diaphragmatic hernia without obstruction or gangrene: Secondary | ICD-10-CM

## 2019-09-23 DIAGNOSIS — Z87891 Personal history of nicotine dependence: Secondary | ICD-10-CM | POA: Diagnosis not present

## 2019-09-23 NOTE — Progress Notes (Signed)
Hematology/Oncology Consult note [facility] [phone] [phone]   Patient Care Team: Ria Bush, MD as PCP - General (Family Medicine) Wellington Hampshire, MD as PCP - Cardiology (Cardiology) Wellington Hampshire, MD as Consulting Physician (Cardiology)  REFERRING PROVIDER: [name], MD  CHIEF COMPLAINTS/REASON FOR VISIT:  Evaluation of lung nodule  HISTORY OF PRESENTING ILLNESS:   Stephen [name] is a  85 y.o.  male with PMH listed below was seen in consultation at the request of  [name], MD  for evaluation of lung nodule And was accompanied by his significant other Stephen [name] today. Patient has multiple medical problems and is a poor historian.  Medical history was obtained mainly from [place]. 05/06/2019 patient had CT angiogram of the neck and head For evaluation of carotid stenosis, status post stenting.  There was an incidental finding of incompletely visualized nodule opacity in the left upper lobe measuring up to 1.6 cm. Follow-up CT chest scan on 07/29/2019 reviewed a suspicious left upper lobe pulmonary nodule measuring 1.6 cm, slightly increased in size compared to prior study.  Hiatal hernia. 08/13/2019, PET scan was obtained which showed hypermetabolic nodule in the medial left upper lobe, SUV 7.4.  Also focal hypermetabolic activity within the distal esophagus just superior to the GE junction with SUV 9.7.  Potential mild thickening of the esophageal wall. There was concern of esophageal carcinoma.  Patient was referred to Dr. Vicente Males gastroenterology for upper endoscopy. 09/18/2019, upper endoscopy showed normal examined duodenum, large hiatal hernia.  Normal esophagus.  No specimen was collected. Patient was referred by Dr. Vicente Males to heme-onc for further evaluation and discussion. Patient denies any cough, hemoptysis, unintentional weight loss. He has dementia.    Review of Systems  Unable to perform ROS: Dementia    Constitutional: Positive for fatigue. Negative for appetite change and unexpected weight change.  Respiratory: Negative for cough, hemoptysis and shortness of breath.   Gastrointestinal: Negative for abdominal pain.    MEDICAL HISTORY:  Past Medical History:  Diagnosis Date   Arthritis of knee    BPH (benign prostatic hypertrophy)    w/ nocturia   Coronary artery disease 03/2014   Inferior ST elevation myocardial infarction. Cardiac catheterization showed an occluded mid RCA. He had an angioplasty and drug-eluting stent placement with a 3.0 x 16 mm Promus drug-eluting stent. Ejection fraction was 45% by echo, completed cardiac rehab 06/2014   CVA (cerebral vascular accident) ([place]) 03/2018   Dementia ([place])    s/p stroke   Diabetes type 2, controlled ([place])    Pt is taking Metformin   Heart murmur    keeping an eye on it but not treating   History of basal cell cancer    s/p mohs   History of colon polyps    History of hiatal hernia 05/2018   this causes laryngeal spasms. pt was taking nitro for this when bp drops and he ends up with tia symptoms   History of hypertension    HLD (hyperlipidemia)    diet controlled in past   Hypertension    Insomnia    treated with multiple meds in past   MI (myocardial infarction) ([place]) 2015   Rosacea    Skin cancer    Sleep apnea    has cpap but does not use properly and does not like it!!   Stroke ([place]) 03/2018   per cat scan, patient has had several strokes   TIA (transient ischemic attack)    x  2 since CVA 03-2018   UTI (urinary tract infection)     SURGICAL HISTORY: Past Surgical History:  Procedure Laterality Date   CARDIAC CATHETERIZATION  03/2014   [facility];x1 stent   CAROTID PTA/STENT INTERVENTION Right 05/2018   [facility]   CAROTID PTA/STENT INTERVENTION Right 06/05/2018   Procedure: CAROTID PTA/STENT INTERVENTION;  Surgeon: Algernon Huxley, MD;  Location: [place] [facility];  Service: Cardiovascular;  Laterality:  Right;   CATARACT EXTRACTION W/PHACO Left 10/09/2016   Procedure: CATARACT EXTRACTION PHACO AND INTRAOCULAR LENS PLACEMENT (IOC);  Surgeon: Birder Robson, MD;  Location: [facility] ORS;  Service: Ophthalmology;  Laterality: Left;  [place] 1.13 AP% 18.3 CDE 13.45 Fluid pack lot # [id number]   COLONOSCOPY  08/2010   hyperplastic polyp, rec rpt 5 yrs   ESOPHAGOGASTRODUODENOSCOPY  05/2015   dilated stricture, normal biopsies, HH, no definite infection [name] @ [facility])   ESOPHAGOGASTRODUODENOSCOPY (EGD) WITH PROPOFOL N/A 09/18/2019   Procedure: ESOPHAGOGASTRODUODENOSCOPY (EGD) WITH PROPOFOL;  Surgeon: [name], MD;  Location: [facility] ENDOSCOPY;  Service: Gastroenterology;  Laterality: N/A;   EYE SURGERY Left    cataract extraction   MOHS SURGERY     basal cell chin/back   REPLACEMENT TOTAL KNEE Left 08/2006   VASECTOMY  1970    SOCIAL HISTORY: Social History   Socioeconomic History   Marital status: Widowed    Spouse name: Not on file   Number of children: Not on file   Years of education: Not on file   Highest education level: Not on file  Occupational History   Not on file  Social Needs   Financial resource strain: Not on file   Food insecurity    Worry: Not on file    Inability: Not on file   Transportation needs    Medical: Not on file    Non-medical: Not on file  Tobacco Use   Smoking status: Former Smoker    Types: Cigarettes    Quit date: 11/12/1962    Years since quitting: 56.9   Smokeless tobacco: Never Used   Tobacco comment: Quit 1964  Substance and Sexual Activity   Alcohol use: Yes    Alcohol/week: 1.0 standard drinks    Types: 1 Cans of beer per week    Comment: rarely   Drug use: No   Sexual activity: Never  Lifestyle   Physical activity    Days per week: Not on file    Minutes per session: Not on file   Stress: Not on file  Relationships   Social connections    Talks on phone: Not on file    Gets together: Not on file    Attends  religious service: Not on file    Active member of club or organization: Not on file    Attends meetings of clubs or organizations: Not on file    Relationship status: Not on file   Intimate partner violence    Fear of current or ex partner: Not on file    Emotionally abused: Not on file    Physically abused: Not on file    Forced sexual activity: Not on file  Other Topics Concern   Not on file  Social History Narrative   Lives at [facility] with friend - Osvaldo Angst RN.   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 Vegetarian.  FAMILY HISTORY: Family History  Problem Relation Age of Onset   Cancer Father 91       prostate and colon   Cancer Mother        ovarian or uterine   CAD Brother 79       MI, smoker   Parkinson's disease Brother    COPD Brother    CAD Brother        MI   Stroke Sister     ALLERGIES:  is allergic to aricept [donepezil hcl]; belsomra [suvorexant]; vancomycin; ambien [zolpidem tartrate]; atorvastatin; doxycycline; penicillins; and tamsulosin.  MEDICATIONS:  Current Outpatient Medications  Medication Sig Dispense Refill   acetaminophen (TYLENOL) 500 MG tablet Take 1 tablet (500 mg total) by mouth 2 (two) times daily. 30 tablet 0   ARIPiprazole (ABILIFY) 2 MG tablet Take 1 tablet by mouth daily.     aspirin EC 81 MG tablet Take 81 mg by mouth every morning.     clonazePAM (KLONOPIN) 1 MG tablet TAKE 1/2-1 TABLET BY MOUTH AT BEDTIME 30 tablet 1   clopidogrel (PLAVIX) 75 MG tablet TAKE 1 TABLET (75 MG TOTAL) BY MOUTH AT BEDTIME. 90 tablet 1   finasteride (PROSCAR) 5 MG tablet TAKE 1 TABLET BY MOUTH EVERY DAY 90 tablet 1   memantine (NAMENDA) 10 MG tablet TAKE 1 TABLET BY MOUTH TWICE A DAY 180 tablet 2   metFORMIN (GLUCOPHAGE) 500 MG tablet TAKE 1 TABLET (500 MG TOTAL) BY  MOUTH 2 (TWO) TIMES DAILY WITH A MEAL. 180 tablet 1   Multiple Vitamin (MULTIVITAMIN WITH MINERALS) TABS tablet Take 1 tablet by mouth daily.     pantoprazole (PROTONIX) 40 MG tablet TAKE 1 TABLET BY MOUTH TWICE A DAY 180 tablet 1   pravastatin (PRAVACHOL) 40 MG tablet TAKE 1 TABLET BY MOUTH EVERY DAY 90 tablet 3   vitamin B-12 (CYANOCOBALAMIN) 500 MCG tablet Take 500 mcg by mouth daily.     No current facility-administered medications for this visit.      PHYSICAL EXAMINATION: ECOG PERFORMANCE STATUS: 2 - Symptomatic, <50% confined to bed Vitals:   09/23/19 1119  BP: (!) 142/82  Pulse: 66  Resp: 16  Temp: (!) 95 F (35 C)   Filed Weights   09/23/19 1119  Weight: 183 lb 8 oz (83.2 kg)    Physical Exam Constitutional:      General: He is not in acute distress.    Comments: Frail, sitting in wheelchair  HENT:     Head: Normocephalic and atraumatic.  Eyes:     General: No scleral icterus.    Pupils: Pupils are equal, round, and reactive to light.  Neck:     Musculoskeletal: Normal range of motion and neck supple.  Cardiovascular:     Rate and Rhythm: Normal rate and regular rhythm.     Heart sounds: Murmur present.  Pulmonary:     Effort: Pulmonary effort is normal. No respiratory distress.     Breath sounds: No wheezing.  Abdominal:     General: Bowel sounds are normal. There is no distension.     Palpations: Abdomen is soft. There is no mass.     Tenderness: There is no abdominal tenderness.  Musculoskeletal: Normal range of motion.        General: No deformity.  Skin:    General: Skin is warm and dry.     Findings: No erythema or rash.  Neurological:     Mental Status: He is alert.     Cranial  Nerves: No cranial nerve deficit.     Coordination: Coordination normal.     Comments: Answer simple questions.  Follow simple commands.  Psychiatric:        Behavior: Behavior normal.        Thought Content: Thought content normal.     LABORATORY DATA:  I have  reviewed the data as listed Lab Results  Component Value Date   WBC 8.9 01/22/2019   HGB 11.7 (L) 01/22/2019   HCT 36.1 (L) 01/22/2019   MCV 83.7 01/22/2019   PLT 309.0 01/22/2019   Recent Labs    11/23/18 2110 11/24/18 0341 01/22/19 1239 05/06/19 1136  NA 136 138 137  --   K 3.7 3.5 4.6  --   CL 107 109 102  --   CO2 22 21* 25  --   GLUCOSE 132* 129* 80  --   BUN 17 14 16   --   CREATININE 1.17 1.14 1.18 1.20  CALCIUM 8.3* 8.0* 9.5  --   GFRNONAA 55* 57*  --   --   GFRAA >60 >60  --   --   PROT 6.5  --  6.9  --   ALBUMIN 3.6  --  4.0  --   AST 40  --  19  --   ALT 20  --  9  --   ALKPHOS 47  --  52  --   BILITOT 0.5  --  0.4  --    Iron/TIBC/Ferritin/ %Sat    Component Value Date/Time   IRON 132 12/12/2015 0937   FERRITIN 26.4 12/12/2015 0937   IRONPCTSAT 35.3 12/12/2015 0937      RADIOGRAPHIC STUDIES: I have personally reviewed the radiological images as listed and agreed with the findings in the report. Ct Chest Wo Contrast  Result Date: 07/29/2019 CLINICAL DATA:  Lung nodule EXAM: CT CHEST WITHOUT CONTRAST TECHNIQUE: Multidetector CT imaging of the chest was performed following the standard protocol without IV contrast. COMPARISON:  CT 05/06/2019 FINDINGS: Cardiovascular: Heart size within normal limits. No pericardial effusion. Thoracic aorta is tortuous however nonaneurysmal. Scattered atherosclerotic calcifications of the aorta and coronary arteries. Mediastinum/Nodes: No enlarged mediastinal or axillary lymph nodes. Thyroid gland and trachea demonstrate no significant findings. Lungs/Pleura: Mild centrilobular emphysema with upper lobe predominance. Right basilar scarring/atelectasis. Irregularly marginated juxtapleural nodular opacity within the anteromedial aspect of the left upper lobe measuring 14 x 16 mm (series 3, image 60; series 5, image 38). No pleural effusion. No pneumothorax. Upper Abdomen: Large hiatal hernia containing a large portion of the stomach  within the thorax. No acute findings within the upper abdomen. Moderate amount of stool within the visualized colon. 7 mm low-density lesion within the anterior aspect of the right hepatic lobe, too small to definitively characterize, but most likely represents cyst versus hemangioma. Musculoskeletal: Degenerative changes of the shoulders with moderate bilateral glenohumeral joint effusions containing ossified loose bodies. No acute osseous findings. IMPRESSION: 1. Suspicious left upper lobe pulmonary nodule measuring 16 mm, which may be slightly increased in size compared to prior. Further evaluation with PET-CT and/or tissue sampling should be considered at this time. 2. Large hiatal hernia. 3. Aortic Atherosclerosis (ICD10-I70.0) and Emphysema (ICD10-J43.9). Electronically Signed   By: Davina Poke M.D.   On: 07/29/2019 15:25   Nm Pet Image Initial (pi) Skull Base To Thigh  Result Date: 08/13/2019 CLINICAL DATA:  Initial treatment strategy for lung mass. EXAM: NUCLEAR MEDICINE PET SKULL BASE TO THIGH TECHNIQUE: 10.1 mCi F-18 FDG was  injected intravenously. Full-ring PET imaging was performed from the skull base to thigh after the radiotracer. CT data was obtained and used for attenuation correction and anatomic localization. Fasting blood glucose: 96 mg/dl COMPARISON:  CT 07/29/2019 FINDINGS: Mediastinal blood pool activity: SUV max 2.8 Liver activity: SUV max NA NECK: Incidental CT findings: none CHEST: The medial aspect of the LEFT upper lobe hypermetabolic nodule measures 2.1 x 1.2 cm SUV max equal 7.4. There is focal hypermetabolic activity within the distal esophagus a just superior to the GE junction with SUV max equal 9.7. There is potential mild thickening of the esophageal wall at this level 21.3 cm (image 97/3. Moderate hiatal hernia noted. No paraesophageal metastatic adenopathy. No hypermetabolic mediastinal lymph nodes. Incidental CT findings: none ABDOMEN/PELVIS: No abnormal hypermetabolic  activity within the liver, pancreas, adrenal glands, or spleen. No hypermetabolic lymph nodes in the abdomen or pelvis. Intense metabolic activity associated with the RIGHT colon is favored a benign. Intense activity activity in the small bowel is also favored benign. Incidental CT findings: Atherosclerotic calcification of the aorta. SKELETON: No focal hypermetabolic activity to suggest skeletal metastasis. Incidental CT findings: none IMPRESSION: 1. Hypermetabolic nodule in the medial LEFT upper lobe is concerning for BRONCHOGENIC CARCINOMA. 2. No hypermetabolic mediastinal nodal metastasis. 3. Focal hypermetabolic activity in distal esophagus. Concern for a primary ESOPHAGEAL CARCINOMA. Recommend upper endoscopy for further evaluation. Of note, large sliding-type esophageal hernia. Electronically Signed   By: Suzy Bouchard M.D.   On: 08/13/2019 11:20     ASSESSMENT & PLAN:  1. Lung nodule   2. Vascular dementia without behavioral disturbance (HCC)    CT images and PET scan images were independently reviewed by me and discussed with patient as well as Stephen [name].  CT findings are suspicious for primary lung cancer. Discussed that standard way will be to proceed with tissue diagnosis via CT-guided biopsy.  Given patient's age, multiple medical problems, poor performance status, his significant other expressed his wishes of not proceeding with biopsy.  Significant other Stephen [name] informs me that she is patient's  power of attorney. She is interested in proceeding with palliative care. We discussed about establish care with radiation oncology to explore the feasibility of radiation without biopsy. Will discuss case on tumor board.   Stephen [name] will consider and update me.  Per Stephen [name], PCP has referred patient to palliative care.   Orders Placed This Encounter  Procedures   CT Biopsy    Standing Status:   Future    Standing Expiration Date:   09/22/2020    Order Specific Question:   Lab orders requested (DO  NOT place separate lab orders, these will be automatically ordered during procedure specimen collection):    Answer:   Surgical Pathology    Order Specific Question:   Reason for Exam (SYMPTOM  OR DIAGNOSIS REQUIRED)    Answer:   hpermetabolic lung nodule    Order Specific Question:   Preferred location?    Answer:   [facility]    Order Specific Question:   Radiology Contrast Protocol - do NOT remove file path    Answer:   \[email or web address]    All questions were answered. The patient knows to call the clinic with any problems questions or concerns.  cc [name], MD   Thank you for this kind referral and the opportunity to participate in the care of this patient. A copy of today's note is routed to referring provider  Total face to face encounter time for this patient visit  was 45 min. >50% of the time was  spent in counseling and coordination of care.    Earlie Server, MD, PhD Hematology Oncology [facility] Pager- [phone] 09/23/2019

## 2019-09-23 NOTE — Progress Notes (Signed)
Patient here for new patient evaluation and does not offer any problems today.

## 2019-09-28 ENCOUNTER — Other Ambulatory Visit: Payer: Medicare Other

## 2019-09-28 ENCOUNTER — Telehealth: Payer: Self-pay

## 2019-09-28 ENCOUNTER — Other Ambulatory Visit: Payer: Self-pay

## 2019-09-28 DIAGNOSIS — Z515 Encounter for palliative care: Secondary | ICD-10-CM

## 2019-09-28 NOTE — Telephone Encounter (Signed)
[name] - patient's caregiver, contacted the office and stated that patient needed PA on Protonix. I contacted the patient's insurance company and completed PA over the phone which was approved until 09/27/20. I have contacted [name] and advised her that PA was approved.  Will route to [place] as FYI. Thanks!

## 2019-09-28 NOTE — Progress Notes (Signed)
COMMUNITY PALLIATIVE CARE SW NOTE  PATIENT NAME: Stephen [name] DOB: June 17, 1927 MRN: [id number]  PRIMARY CARE PROVIDER: Ria Bush, MD  RESPONSIBLE PARTY:  Acct ID - Guarantor Home Phone Work Phone Relationship Acct Type  [id number] Stephen [name], Stephen [name]* [phone]  Self P/F     [address], [place], [place] [address]     PLAN OF CARE and INTERVENTIONS:             1. GOALS OF CARE/ ADVANCE CARE PLANNING:  Patient is DNR, form is in the home. HCPOA is [name] (significant other). Goal is to keep patient at home.  2. SOCIAL/EMOTIONAL/SPIRITUAL ASSESSMENT/ INTERVENTIONS:  SW and RN met with patient and [name] in the home. [name] provided brief history. Patient denies pain at this time. Patient said he is resting well and has a good appetite. Patient is weaker, continues to use cane. No reported falls. 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 Patient has lived in [place] independent living with [name] for about eight years. Patient has two adult children - a daughter in [place], [place] and a son in [place]. Patient celebrated his 89th birthday last week and was able to go and visit his daughter and granddaughters in [place] this weekend.  3. PATIENT/CAREGIVER EDUCATION/ COPING:  Patient was alert, engaged in conversation. Patient answered questions appropriately but is forgetful and looks to [name] for assistance. Patient was pleasant, enjoys spending time with family, doing crossword puzzles and watching the birds outside the windows. 4. PERSONAL EMERGENCY PLAN:  Family will call 9-1-1 for emergencies. 5. COMMUNITY RESOURCES COORDINATION/ HEALTH CARE NAVIGATION:  [name] assists with care coordination and appointments. [name] is waiting for a call back from oncology MD about decisions regarding treatment options. [name] also plans to follow-up with eye doctor regarding patient's cataract concerns.  6. FINANCIAL/LEGAL  CONCERNS/INTERVENTIONS:  None.     SOCIAL HX:  Social History   Tobacco Use  . Smoking status: Former Smoker    Types: Cigarettes    Quit date: 11/12/1962    Years since quitting: 56.9  . Smokeless tobacco: Never Used  . Tobacco comment: Quit 1964  Substance Use Topics  . Alcohol use: Yes    Alcohol/week: 1.0 standard drinks    Types: 1 Cans of beer per week    Comment: rarely    CODE STATUS:   Code Status: Prior (DNR) ADVANCED DIRECTIVES: Y MOST FORM COMPLETE:  No. HOSPICE EDUCATION PROVIDED: Yes.  PPS: Patient is mostly independent with ADLs, needs standby for safety. Patient ambulates with cane.  I spent 45 minutes with patient/family, from 11:00-11:45a providing education, support and consultation.   Margaretmary Lombard, LCSW

## 2019-09-28 NOTE — Progress Notes (Signed)
PATIENT NAME: Stephen [name] DOB: 02/04/1927 MRN: [id number]  PRIMARY CARE PROVIDER: Ria Bush, MD  RESPONSIBLE PARTY:  Acct ID - Guarantor Home Phone Work Phone Relationship Acct Type  [id number] Stephen [name], CANELO* [phone]  Self P/F     [address], [place], [place] [address]    PLAN OF CARE and INTERVENTIONS:               1.  GOALS OF CARE/ ADVANCE CARE PLANNING:  Remain at home with significant other Stephen [name] with comfort care.                 2.  PATIENT/CAREGIVER EDUCATION:  Education on fall precautions, education on disease progression, reviewed meds, support               4. PERSONAL EMERGENCY PLAN: Patient and wife reside at [place].               5.  DISEASE STATUS: SW and RN made joint home visit. Palliative care team met with patient and his significant other Stephen [name]. 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 Patient and Stephen [name] reside at [facility] independent living and have been residents there for eight years. Patient recently had a CT angiogram of the neck for evaluation of carotid stenosis. There was an incidental finding of a mass in the left upper lobe measuring 1.6 cm. Patient seeing doctor Tasia Catchings at [facility] and MD feels mass is malignant. Patient is not a candidate to have surgery or receive chemo. Doctor Tasia Catchings to discuss with tumor board to see if patient could possibly qualify for radiation. Stephen [name] states patient has a son and daughter and that family in agreement with comfort care for patient other than they may consider radiation if patient is a candidate. Patient's past medical history includes large hiatal hernia, dementia, BPH, coronary artery disease, history of CVA, type 2 diabetes, heart murmur, HLD, hypertension, history of sleep apnea, history of TIA's and frequent UTIs. Patient able to ambulate to kitchen table with cane. Patient walks very slowly. Patient denies suffering any recent falls. Patient denies having pain at the present time.  Patient has had no weight loss. Stephen [name] reports patient wore CPAP in the past but is no longer using. Patient biggest change in condition is decreased mobility. Patient's appetite remains good. Stephen [name] states she has noticed patient has been coughing more and cough is non-productive. Patient has poor memory recall. Patient has a DNR in the home. Palliative care services explain and patient and Stephen [name] in agreement. Patient would likely qualify for hospice however decision was made to continue with palliative care services until doctor Tasia Catchings got in contact with Stephen [name] about patient receiving radiation. Stephen [name] is patient's Health Care power of attorney. Palliative care team left contact information with Stephen [name] on how to reach palliative care team. Nurse reviewed patient's medications with Stephen [name], education on fall precautions,and support. Stephen [name] encouraged to contact palliative care team with questions or concerns.      HISTORY OF PRESENT ILLNESS:    CODE STATUS: DNR  ADVANCED DIRECTIVES: Y MOST FORM: No PPS: 50%   PHYSICAL EXAM:   VITALS: Today's Vitals   09/28/19 1145  BP: 128/84  Pulse: 75  Resp: 18  Temp: 98.3 F (36.8 C)  TempSrc: Temporal  SpO2: 99%  Weight: 183 lb (83 kg)  Height: '5\' 8"'$  (1.727 m)  PainSc: 0-No pain    LUNGS: clear to auscultation  CARDIAC: Cor RRR  EXTREMITIES: Trace edema SKIN: Skin  color, texture, turgor normal. No rashes or lesions  NEURO: positive for gait problems, memory problems and weakness       Nilda Simmer, RN

## 2019-09-28 NOTE — Telephone Encounter (Signed)
Noted  

## 2019-09-29 DIAGNOSIS — G4733 Obstructive sleep apnea (adult) (pediatric): Secondary | ICD-10-CM | POA: Diagnosis not present

## 2019-09-29 DIAGNOSIS — F0280 Dementia in other diseases classified elsewhere without behavioral disturbance: Secondary | ICD-10-CM | POA: Diagnosis not present

## 2019-09-29 DIAGNOSIS — I63512 Cerebral infarction due to unspecified occlusion or stenosis of left middle cerebral artery: Secondary | ICD-10-CM | POA: Diagnosis not present

## 2019-09-29 DIAGNOSIS — G309 Alzheimer's disease, unspecified: Secondary | ICD-10-CM | POA: Diagnosis not present

## 2019-09-29 DIAGNOSIS — F0150 Vascular dementia without behavioral disturbance: Secondary | ICD-10-CM | POA: Diagnosis not present

## 2019-09-29 DIAGNOSIS — R2689 Other abnormalities of gait and mobility: Secondary | ICD-10-CM | POA: Diagnosis not present

## 2019-10-01 ENCOUNTER — Encounter: Payer: Self-pay | Admitting: Oncology

## 2019-10-01 ENCOUNTER — Other Ambulatory Visit: Payer: Medicare Other

## 2019-10-01 NOTE — Progress Notes (Signed)
Tumor Board Documentation  Stephen [name] was presented by Dr Tasia Catchings at our Tumor Board on 10/01/2019, which included representatives from medical oncology, radiation oncology, pathology, radiology, surgical, navigation, internal medicine, genetics, pulmonology, palliative care, research.  Stephen [name] currently presents as a new patient, for discussion with history of the following treatments: active survellience.  Additionally, we reviewed previous medical and familial history, history of present illness, and recent lab results along with all available histopathologic and imaging studies. The tumor board considered available treatment options and made the following recommendations:   Palliative Care referral  The following procedures/referrals were also placed: No orders of the defined types were placed in this encounter.   Clinical Trial Status: not discussed   Staging used:    National site-specific guidelines   were discussed with respect to the case.  Tumor board is a meeting of clinicians from various specialty areas who evaluate and discuss patients for whom a multidisciplinary approach is being considered. Final determinations in the plan of care are those of the provider(s). The responsibility for follow up of recommendations given during tumor board is that of the provider.   Today's extended care, comprehensive team conference, Stephen [name] was not present for the discussion and was not examined.   Multidisciplinary Tumor Board is a multidisciplinary case peer review process.  Decisions discussed in the Multidisciplinary Tumor Board reflect the opinions of the specialists present at the conference without having examined the patient.  Ultimately, treatment and diagnostic decisions rest with the primary provider(s) and the patient.

## 2019-10-07 ENCOUNTER — Ambulatory Visit: Payer: Medicare Other | Admitting: Gastroenterology

## 2019-10-12 ENCOUNTER — Other Ambulatory Visit: Payer: Medicare Other

## 2019-10-12 ENCOUNTER — Other Ambulatory Visit: Payer: Self-pay

## 2019-10-12 DIAGNOSIS — Z515 Encounter for palliative care: Secondary | ICD-10-CM

## 2019-10-12 NOTE — Progress Notes (Signed)
PATIENT NAME: ANKUSH GINTZ DOB: 31-Dec-1926 MRN: [id number]  PRIMARY CARE PROVIDER: Ria Bush, MD  RESPONSIBLE PARTY:  Acct ID - Guarantor Home Phone Work Phone Relationship Acct Type  [id number] HUEY, SCALIA* [phone]  Self P/F     [address], [place],  [address]    PLAN OF CARE and INTERVENTIONS:               1.  GOALS OF CARE/ ADVANCE CARE PLANNING:  Remain at home with significant other [name].               2.  PATIENT/CAREGIVER EDUCATION:  Education on disease progression, education on fall precautions, reviewed meds, support                 3.  DISEASE STATUS: RN made home visit to assess patient. Patients significant other [name] called reporting a change in patients status. [name] reports  Patient is no longer able to ambulate with out his walker. [name] reports she observed a change in patients status yesterday when patient attempted to get up from the table after eating lunch. Patient sitting in rocking chair when nurse arrived. Patient denies having pain at the present time. Patient states he feels like he is not getting around as well due to issues with left knee. [name] states patient has had issues with his knee for years. [name] reports she has noticed more confusion and patient is coughing after swallowing medications. Patients appetite remains good. Patient is scheduled to begin outpatient PT services tomorrow. [name] states she will request in-home PT services for patient due to patients increased weakness. Nurse talks with [name] about disease progression.  [name] states she wants to meet with PT and in agreement with nurse calling back on Thursday to follow up on patients status. Patient is not receiving any treatment for cancer. Patients vital signs are stable. Patient denies any shortness of breath. [name] reports  patients cough is non-productive. Patient has no edema in lower extremities. Patients only change is increased weakness and difficulty ambulating.  Support provided to patient and [name].  Dr Tasia Catchings met with tumor board and tumor board recommended patient continue with palliative care. Support provided to patient and [name]  and both were encouraged to contact palliative care with questions or concerns.     HISTORY OF PRESENT ILLNESS: Patient is a 85 year old patient who resides at [facility]. Patient is cared for by his significant other [name].  Patient seen monthly and PRN by PMPM team.    CODE STATUS: DNR  ADVANCED DIRECTIVES: Y MOST FORM: No PPS: 40%   PHYSICAL EXAM:   VITALS: Today's Vitals   10/12/19 1100  BP: 108/80  Pulse: 75  Resp: 16  Temp: 98.8 F (37.1 C)  TempSrc: Temporal  SpO2: 98%  Weight: 184 lb (83.5 kg)  PainSc: 0-No pain    LUNGS: decreased breath sounds CARDIAC: Cor RRR  EXTREMITIES: none edema SKIN: Skin color, texture, turgor normal. No rashes or lesions  NEURO: positive for gait problems, memory problems and weakness       Nilda Simmer, RN

## 2019-10-14 DIAGNOSIS — M6281 Muscle weakness (generalized): Secondary | ICD-10-CM | POA: Diagnosis not present

## 2019-10-14 DIAGNOSIS — R2689 Other abnormalities of gait and mobility: Secondary | ICD-10-CM | POA: Diagnosis not present

## 2019-10-14 DIAGNOSIS — R278 Other lack of coordination: Secondary | ICD-10-CM | POA: Diagnosis not present

## 2019-10-17 ENCOUNTER — Other Ambulatory Visit: Payer: Self-pay | Admitting: Family Medicine

## 2019-10-20 DIAGNOSIS — R278 Other lack of coordination: Secondary | ICD-10-CM | POA: Diagnosis not present

## 2019-10-20 DIAGNOSIS — M6281 Muscle weakness (generalized): Secondary | ICD-10-CM | POA: Diagnosis not present

## 2019-10-20 DIAGNOSIS — R2689 Other abnormalities of gait and mobility: Secondary | ICD-10-CM | POA: Diagnosis not present

## 2019-10-21 ENCOUNTER — Telehealth: Payer: Self-pay

## 2019-10-21 NOTE — Telephone Encounter (Signed)
RN called patients significant other [name]) to follow up on how patient is doing.  [name] did not answer phone.  RN left message requesting call back.

## 2019-10-21 NOTE — Telephone Encounter (Signed)
Patients significant other Osvaldo Angst returned RN's call.  [name] reports patient is doing better with receiving PT and not dragging foot as much.  [name] reports she is receiving some assistance from facility with bathing patient.  [name] in agreement with RN making home visit 10/28/19 at 2:00 PM.

## 2019-10-22 DIAGNOSIS — R278 Other lack of coordination: Secondary | ICD-10-CM | POA: Diagnosis not present

## 2019-10-22 DIAGNOSIS — M6281 Muscle weakness (generalized): Secondary | ICD-10-CM | POA: Diagnosis not present

## 2019-10-22 DIAGNOSIS — R2689 Other abnormalities of gait and mobility: Secondary | ICD-10-CM | POA: Diagnosis not present

## 2019-10-27 DIAGNOSIS — R2689 Other abnormalities of gait and mobility: Secondary | ICD-10-CM | POA: Diagnosis not present

## 2019-10-27 DIAGNOSIS — R278 Other lack of coordination: Secondary | ICD-10-CM | POA: Diagnosis not present

## 2019-10-27 DIAGNOSIS — M6281 Muscle weakness (generalized): Secondary | ICD-10-CM | POA: Diagnosis not present

## 2019-10-28 ENCOUNTER — Other Ambulatory Visit: Payer: Self-pay

## 2019-10-28 ENCOUNTER — Other Ambulatory Visit: Payer: Medicare Other

## 2019-10-28 DIAGNOSIS — Z515 Encounter for palliative care: Secondary | ICD-10-CM

## 2019-10-28 NOTE — Progress Notes (Signed)
PATIENT NAME: Stephen [name] DOB: 06/07/1927 MRN: [id number]  PRIMARY CARE PROVIDER: Ria Bush, MD  RESPONSIBLE PARTY:  Acct ID - Guarantor Home Phone Work Phone Relationship Acct Type  [id number] NETHAN, CAUDILLO* [phone]  Self P/F     [address], [place], [place] [address]    PLAN OF CARE and INTERVENTIONS:               1.  GOALS OF CARE/ ADVANCE CARE PLANNING:  Remain in home with significant other [name] and function at optimal level.               2.  PATIENT/CAREGIVER EDUCATION:  Education on fall precautions, education on s/s of infection, review meds, support               3.  DISEASE STATUS: RN made scheduled palliative care visit. Patient in bedroom when nurse arrives. Patients significant other [name] in home with patient. Patient ambulates from bedroom with his cane. Patient is doing much better than when nurse saw him last month. Patient is receiving in-home PT two times per week. [name] reports she was able to get patient into the shower last PM.  Patient also has CNA's coming into home 3 times weekly to assist with personal care.  [name] states she feels like patient was having TIA's prior to RN visiting last month.  Patient has not had any MD appointments since nurse saw patient last month.   Patient continues to have pain in his left knee. Patient has had no medication changes per [name]. Patient denies having any shortness of breath or cough. Patient is no longer using rolling walker. Patient has not suffered any falls. Patient denies having any trouble with sleep and informed nurse he sleeps well. Patient has no edema in his lower extremities. [name] reports patients appetite is good. [name] said she sees no reason to take patient to cardiologist or back to cancer center as patient chose not to have any work up done for lesion in left lung. Patient and [name] remain in agreement with palliative care services. Patient and [name] encouraged to contact palliative care with  questions or concerns.    HISTORY OF PRESENT ILLNESS: Patient is a 85 year old male who resides in Senior Living apartment with his significant other [name].  Patient being followed by palliative care team and is seen monthly and PRN.     CODE STATUS: DNR  ADVANCED DIRECTIVES: Y MOST FORM: No PPS: 50%   PHYSICAL EXAM:   VITALS: Today's Vitals   10/28/19 1430  BP: 128/76  Pulse: 70  Resp: 16  Temp: 98.2 F (36.8 C)  TempSrc: Temporal  SpO2: 99%  PainSc: 0-No pain    LUNGS: decreased breath sounds CARDIAC: Cor RRR  EXTREMITIES: none edema SKIN: Skin color, texture, turgor normal. No rashes or lesions  NEURO: positive for gait problems, memory problems and weakness       Nilda Simmer, RN

## 2019-10-29 DIAGNOSIS — R2689 Other abnormalities of gait and mobility: Secondary | ICD-10-CM | POA: Diagnosis not present

## 2019-10-29 DIAGNOSIS — M6281 Muscle weakness (generalized): Secondary | ICD-10-CM | POA: Diagnosis not present

## 2019-10-29 DIAGNOSIS — R278 Other lack of coordination: Secondary | ICD-10-CM | POA: Diagnosis not present

## 2019-11-03 DIAGNOSIS — R278 Other lack of coordination: Secondary | ICD-10-CM | POA: Diagnosis not present

## 2019-11-03 DIAGNOSIS — R2689 Other abnormalities of gait and mobility: Secondary | ICD-10-CM | POA: Diagnosis not present

## 2019-11-03 DIAGNOSIS — M6281 Muscle weakness (generalized): Secondary | ICD-10-CM | POA: Diagnosis not present

## 2019-11-05 DIAGNOSIS — M6281 Muscle weakness (generalized): Secondary | ICD-10-CM | POA: Diagnosis not present

## 2019-11-05 DIAGNOSIS — R278 Other lack of coordination: Secondary | ICD-10-CM | POA: Diagnosis not present

## 2019-11-05 DIAGNOSIS — R2689 Other abnormalities of gait and mobility: Secondary | ICD-10-CM | POA: Diagnosis not present

## 2019-11-09 DIAGNOSIS — M6281 Muscle weakness (generalized): Secondary | ICD-10-CM | POA: Diagnosis not present

## 2019-11-09 DIAGNOSIS — R2689 Other abnormalities of gait and mobility: Secondary | ICD-10-CM | POA: Diagnosis not present

## 2019-11-09 DIAGNOSIS — R278 Other lack of coordination: Secondary | ICD-10-CM | POA: Diagnosis not present

## 2019-11-11 DIAGNOSIS — R278 Other lack of coordination: Secondary | ICD-10-CM | POA: Diagnosis not present

## 2019-11-11 DIAGNOSIS — R2689 Other abnormalities of gait and mobility: Secondary | ICD-10-CM | POA: Diagnosis not present

## 2019-11-11 DIAGNOSIS — M6281 Muscle weakness (generalized): Secondary | ICD-10-CM | POA: Diagnosis not present

## 2019-11-16 DIAGNOSIS — M6281 Muscle weakness (generalized): Secondary | ICD-10-CM | POA: Diagnosis not present

## 2019-11-16 DIAGNOSIS — R2689 Other abnormalities of gait and mobility: Secondary | ICD-10-CM | POA: Diagnosis not present

## 2019-11-16 DIAGNOSIS — R278 Other lack of coordination: Secondary | ICD-10-CM | POA: Diagnosis not present

## 2019-11-18 ENCOUNTER — Telehealth: Payer: Self-pay

## 2019-11-18 DIAGNOSIS — R278 Other lack of coordination: Secondary | ICD-10-CM | POA: Diagnosis not present

## 2019-11-18 DIAGNOSIS — M6281 Muscle weakness (generalized): Secondary | ICD-10-CM | POA: Diagnosis not present

## 2019-11-18 DIAGNOSIS — R2689 Other abnormalities of gait and mobility: Secondary | ICD-10-CM | POA: Diagnosis not present

## 2019-11-18 NOTE — Telephone Encounter (Signed)
Telephone call to [name] to schedule palliatuve care visit.  [name] in agreement with RN making home care visit on 11/19/19 at 2:30 PM.

## 2019-11-19 ENCOUNTER — Telehealth: Payer: Self-pay

## 2019-11-19 ENCOUNTER — Other Ambulatory Visit: Payer: Medicare Other

## 2019-11-19 ENCOUNTER — Other Ambulatory Visit: Payer: Self-pay

## 2019-11-19 NOTE — Telephone Encounter (Signed)
Telephone call from Stephen [name] stating she needs to reschedule home visit that is scheduled for today.  Stephen [name] requests RN make home visit on 11/24/18 at 2:00 PM.

## 2019-11-20 ENCOUNTER — Other Ambulatory Visit: Payer: Self-pay | Admitting: Family Medicine

## 2019-11-20 NOTE — Telephone Encounter (Signed)
Name of Medication: Clonazepam Name of Pharmacy: [facility] Last Fill or Written Date and Quantity: 10/18/19, #30 Last Office Visit and Type: 06/02/19, AWV Next Office Visit and Type: 12/08/19, 6 mo f/u Last Controlled Substance Agreement Date: none Last UDS: none

## 2019-11-21 NOTE — Telephone Encounter (Signed)
ERx 

## 2019-11-24 DIAGNOSIS — M6281 Muscle weakness (generalized): Secondary | ICD-10-CM | POA: Diagnosis not present

## 2019-11-24 DIAGNOSIS — R2689 Other abnormalities of gait and mobility: Secondary | ICD-10-CM | POA: Diagnosis not present

## 2019-11-24 DIAGNOSIS — R278 Other lack of coordination: Secondary | ICD-10-CM | POA: Diagnosis not present

## 2019-11-25 ENCOUNTER — Other Ambulatory Visit: Payer: Medicare Other

## 2019-11-25 ENCOUNTER — Other Ambulatory Visit: Payer: Self-pay

## 2019-11-25 DIAGNOSIS — Z515 Encounter for palliative care: Secondary | ICD-10-CM

## 2019-11-25 NOTE — Progress Notes (Signed)
PATIENT NAME: Stephen [name] DOB: 12-Apr-1927 MRN: [id number]  PRIMARY CARE PROVIDER: Ria Bush, MD  RESPONSIBLE PARTY:  Acct ID - Guarantor Home Phone Work Phone Relationship Acct Type  [id number] Danton [phone]  Self P/F     [address], [place],  [address]    PLAN OF CARE and INTERVENTIONS:               1.  GOALS OF CARE/ ADVANCE CARE PLANNING:  Remain at home with significant other [name] for as long as patients care can be managed,.               2.  PATIENT/CAREGIVER EDUCATION:  Education on fall precautions, education on s/s of infection, reviewed meds, support                3.  DISEASE STATUS: RN made scheduled palliative home care visit. Patient at kitchen table drinking tea when nurse arrives. Patients significant other [name] in home with patient. [name] reports PT made home visit yesterday. Patient is now using walker instead of cane. Patient having increased weakness and is no longer able to complete his Sudoku puzzles per [name]. Patient is unable to inform nurse what he had for breakfast or where he lived prior to moving to [facility]. [name] reports patients confusion seems worse when he is tired and late in the evenings. [name] reports patients appetite remains good. Patient has not suffered any recent falls. Patient acquired new hearing aids last week but [name] doesn't feel they are working well for patient.  Patient is getting up by himself at night per [name] but does use his walker. [name] reports patient always had a cough and reports she can hear congestion at times when patient coughs .  Nurse assessed patients lungs and did not hear any congestion or wheezing. Patient has trace edema in his lower extremities. [name] states home health aide is no longer bathing patient that she has been assisting patient with care. Patient scheduled to see primary care provider on January 26th. Patient and [name] remain in agreement with palliative care services. Patient  and [name] encouraged to contact palliative care with questions or concerns.     HISTORY OF PRESENT ILLNESS:    CODE STATUS: DNR   ADVANCED DIRECTIVES: Y MOST FORM: No PPS: 40%   PHYSICAL EXAM:   VITALS: Today's Vitals   11/25/19 1430  BP: 112/64  Pulse: 80  Resp: 18  Temp: 98 F (36.7 C)  TempSrc: Temporal  SpO2: 98%  PainSc: 0-No pain    LUNGS: decreased breathsounds, no wheezing or congestion CARDIAC: Cor RRR  EXTREMITIES: Trace edema SKIN: Skin color, texture, turgor normal. No rashes or lesions  NEURO: positive for gait problems, memory problems and weakness       Nilda Simmer, RN

## 2019-11-26 DIAGNOSIS — R278 Other lack of coordination: Secondary | ICD-10-CM | POA: Diagnosis not present

## 2019-11-26 DIAGNOSIS — M6281 Muscle weakness (generalized): Secondary | ICD-10-CM | POA: Diagnosis not present

## 2019-11-26 DIAGNOSIS — R2689 Other abnormalities of gait and mobility: Secondary | ICD-10-CM | POA: Diagnosis not present

## 2019-12-01 ENCOUNTER — Ambulatory Visit: Payer: Medicare Other | Admitting: Family Medicine

## 2019-12-01 DIAGNOSIS — R2689 Other abnormalities of gait and mobility: Secondary | ICD-10-CM | POA: Diagnosis not present

## 2019-12-01 DIAGNOSIS — R278 Other lack of coordination: Secondary | ICD-10-CM | POA: Diagnosis not present

## 2019-12-01 DIAGNOSIS — M6281 Muscle weakness (generalized): Secondary | ICD-10-CM | POA: Diagnosis not present

## 2019-12-03 DIAGNOSIS — M6281 Muscle weakness (generalized): Secondary | ICD-10-CM | POA: Diagnosis not present

## 2019-12-03 DIAGNOSIS — R278 Other lack of coordination: Secondary | ICD-10-CM | POA: Diagnosis not present

## 2019-12-03 DIAGNOSIS — R2689 Other abnormalities of gait and mobility: Secondary | ICD-10-CM | POA: Diagnosis not present

## 2019-12-08 ENCOUNTER — Encounter: Payer: Self-pay | Admitting: Family Medicine

## 2019-12-08 ENCOUNTER — Ambulatory Visit (INDEPENDENT_AMBULATORY_CARE_PROVIDER_SITE_OTHER): Payer: Medicare Other | Admitting: Family Medicine

## 2019-12-08 ENCOUNTER — Other Ambulatory Visit: Payer: Self-pay

## 2019-12-08 VITALS — BP 124/74 | HR 89 | Temp 97.9°F | Ht 68.0 in | Wt 182.2 lb

## 2019-12-08 DIAGNOSIS — E1122 Type 2 diabetes mellitus with diabetic chronic kidney disease: Secondary | ICD-10-CM | POA: Diagnosis not present

## 2019-12-08 DIAGNOSIS — N183 Chronic kidney disease, stage 3 unspecified: Secondary | ICD-10-CM | POA: Diagnosis not present

## 2019-12-08 DIAGNOSIS — G4709 Other insomnia: Secondary | ICD-10-CM

## 2019-12-08 DIAGNOSIS — R011 Cardiac murmur, unspecified: Secondary | ICD-10-CM

## 2019-12-08 DIAGNOSIS — K5900 Constipation, unspecified: Secondary | ICD-10-CM | POA: Diagnosis not present

## 2019-12-08 DIAGNOSIS — M25562 Pain in left knee: Secondary | ICD-10-CM | POA: Diagnosis not present

## 2019-12-08 DIAGNOSIS — R1310 Dysphagia, unspecified: Secondary | ICD-10-CM | POA: Diagnosis not present

## 2019-12-08 DIAGNOSIS — Z8673 Personal history of transient ischemic attack (TIA), and cerebral infarction without residual deficits: Secondary | ICD-10-CM

## 2019-12-08 DIAGNOSIS — F05 Delirium due to known physiological condition: Secondary | ICD-10-CM | POA: Diagnosis not present

## 2019-12-08 DIAGNOSIS — K449 Diaphragmatic hernia without obstruction or gangrene: Secondary | ICD-10-CM | POA: Diagnosis not present

## 2019-12-08 DIAGNOSIS — R2689 Other abnormalities of gait and mobility: Secondary | ICD-10-CM | POA: Diagnosis not present

## 2019-12-08 DIAGNOSIS — R911 Solitary pulmonary nodule: Secondary | ICD-10-CM | POA: Diagnosis not present

## 2019-12-08 DIAGNOSIS — R278 Other lack of coordination: Secondary | ICD-10-CM | POA: Diagnosis not present

## 2019-12-08 DIAGNOSIS — F0150 Vascular dementia without behavioral disturbance: Secondary | ICD-10-CM

## 2019-12-08 DIAGNOSIS — M6281 Muscle weakness (generalized): Secondary | ICD-10-CM | POA: Diagnosis not present

## 2019-12-08 LAB — POCT GLYCOSYLATED HEMOGLOBIN (HGB A1C): Hemoglobin A1C: 6.1 % — AB (ref 4.0–5.6)

## 2019-12-08 MED ORDER — METFORMIN HCL 500 MG PO TABS: 500.0000 mg | ORAL_TABLET | Freq: Every day | ORAL | 3 refills | Status: DC

## 2019-12-08 MED ORDER — PANTOPRAZOLE SODIUM 40 MG PO TBEC: 40.0000 mg | DELAYED_RELEASE_TABLET | Freq: Two times a day (BID) | ORAL | 1 refills | Status: DC

## 2019-12-08 NOTE — Progress Notes (Signed)
This visit was conducted in person.  BP 124/74 (BP Location: Left Arm, Patient Position: Sitting, Cuff Size: Normal)   Pulse 89   Temp 97.9 F (36.6 C) (Temporal)   Ht 5\' 8"  (1.727 m)   Wt 182 lb 3 oz (82.6 kg)   SpO2 97%   BMI 27.70 kg/m    CC: 6 mo f/u visit Subjective:    Patient ID: Stephen [name], male    DOB: 07/04/1927, 86 y.o.   MRN: [id number]  HPI: Stephen [name] is a 86 y.o. male presenting on 12/08/2019 for Follow-up (Here for 6 mo f/u.  Pt accompanied by significant other, [name]- temp 98.1.)   Recent records in medical chart reviewed. Saw Dr Miachel Roux oncology 09/2019 - for concerning suspicious LUL nodule and increased metabolic activity of distal esophageal wall. EGD was reassuring only showing large hiatal hernia. Case reviewed at tumor board - rec palliative care only, with option to see rad onc for radiation to the lung nodule in future if desired.   Established with [facility] palliative care 09/2019. Seeing monthly. Has had [facility] for L knee pain - doing better after this. To finish PT this week. Ambulating with walker or with cane. Progression in dementia noted.   Partner [name] noticing increasing cough worse with eating or drinking.  Appetite good.  Great sugar control.  Abilify is working very well.  Asks about extending cardiology visits to q yearly - which is reasonable.   Bowel - occasional constipation managed with prunes and metamucil and daily colace Bladder - wears depends. Infrequent nocturia.   New hearing aides!  Requests handicap placard - regularly using cane or walker.      Relevant past medical, surgical, family and social history reviewed and updated as indicated. Interim medical history since our last visit reviewed. Allergies and medications reviewed and updated. Outpatient Medications Prior to Visit  Medication Sig Dispense Refill  . acetaminophen (TYLENOL) 500 MG tablet Take 1 tablet (500 mg total) by mouth 2 (two) times daily. 30  tablet 0  . ARIPiprazole (ABILIFY) 2 MG tablet Take 1 tablet by mouth daily.    Marland Kitchen aspirin EC 81 MG tablet Take 81 mg by mouth every morning.    . clonazePAM (KLONOPIN) 1 MG tablet Take 1 tablet (1 mg total) by mouth at bedtime.    . clopidogrel (PLAVIX) 75 MG tablet TAKE 1 TABLET (75 MG TOTAL) BY MOUTH AT BEDTIME. 90 tablet 1  . finasteride (PROSCAR) 5 MG tablet TAKE 1 TABLET BY MOUTH EVERY DAY 90 tablet 1  . memantine (NAMENDA) 10 MG tablet TAKE 1 TABLET BY MOUTH TWICE A DAY 180 tablet 2  . Multiple Vitamin (MULTIVITAMIN WITH MINERALS) TABS tablet Take 1 tablet by mouth daily.    . pravastatin (PRAVACHOL) 40 MG tablet TAKE 1 TABLET BY MOUTH EVERY DAY 90 tablet 3  . vitamin B-12 (CYANOCOBALAMIN) 500 MCG tablet Take 500 mcg by mouth daily.    . clonazePAM (KLONOPIN) 1 MG tablet TAKE 1/2 TO 1 TABLET BY MOUTH AT BEDTIME 30 tablet 1  . metFORMIN (GLUCOPHAGE) 500 MG tablet TAKE 1 TABLET (500 MG TOTAL) BY MOUTH 2 (TWO) TIMES DAILY WITH A MEAL. 180 tablet 1  . pantoprazole (PROTONIX) 40 MG tablet TAKE 1 TABLET BY MOUTH TWICE A DAY 180 tablet 1   No facility-administered medications prior to visit.     Per HPI unless specifically indicated in ROS section below Review of Systems Objective:    BP 124/74 (BP  Location: Left Arm, Patient Position: Sitting, Cuff Size: Normal)   Pulse 89   Temp 97.9 F (36.6 C) (Temporal)   Ht 5\' 8"  (1.727 m)   Wt 182 lb 3 oz (82.6 kg)   SpO2 97%   BMI 27.70 kg/m   Wt Readings from Last 3 Encounters:  12/08/19 182 lb 3 oz (82.6 kg)  10/12/19 184 lb (83.5 kg)  09/28/19 183 lb (83 kg)    Physical Exam Vitals and nursing note reviewed.  Constitutional:      Appearance: Normal appearance. He is not ill-appearing.     Comments: Walks with cane  Cardiovascular:     Rate and Rhythm: Normal rate and regular rhythm.     Pulses: Normal pulses.     Heart sounds: Murmur (3/6 systolic) present.  Pulmonary:     Effort: Pulmonary effort is normal. No respiratory  distress.     Breath sounds: Normal breath sounds. No wheezing, rhonchi or rales.     Comments: Rub along L chest Musculoskeletal:     Right lower leg: No edema.     Left lower leg: No edema.  Skin:    General: Skin is warm and dry.     Findings: No rash.  Neurological:     Mental Status: He is alert.  Psychiatric:        Mood and Affect: Mood normal.        Behavior: Behavior normal.       Results for orders placed or performed in visit on 12/08/19  POCT glycosylated hemoglobin (Hb A1C)  Result Value Ref Range   Hemoglobin A1C 6.1 (A) 4.0 - 5.6 %   HbA1c POC (<> result, manual entry)     HbA1c, POC (prediabetic range)     HbA1c, POC (controlled diabetic range)     Lab Results  Component Value Date   CREATININE 1.20 05/06/2019   BUN 16 01/22/2019   NA 137 01/22/2019   K 4.6 01/22/2019   CL 102 01/22/2019   CO2 25 01/22/2019    Assessment & Plan:  This visit occurred during the SARS-CoV-2 public health emergency.  Safety protocols were in place, including screening questions prior to the visit, additional usage of staff PPE, and extensive cleaning of exam room while observing appropriate contact time as indicated for disinfecting solutions.   Problem List Items Addressed This Visit    Vascular dementia ([facility] [place])    Thought mixed dementia (alz + vasc) on namenda. Did not tolerate aricept (diarrhea).  [facility] palliative care involved.  Partner notes dementia progression, but no increased level of care needs.      Relevant Medications   clonazePAM (KLONOPIN) 1 MG tablet   Systolic murmur    Again heard today.       Sundowning    Doing well on abilify.       Pulmonary nodule    Concern for bronchogenic carcinoma. S/p tumor board meeting - recommend palliative care for now. Family in agreement.       Left knee pain    Improving with PT. Anticipate OA related.       Insomnia    Palliative care involved.       History of ischemic left MCA stroke   Hiatal  hernia   Dysphagia    Large HH on recent EGD. This was likely cause of increased metabolic activity on recent PET. Continue protonix 40mg  BID.       Controlled type 2 diabetes mellitus with stage 3  chronic kidney disease, without long-term current use of insulin (HCC) - Primary    Chronic, well controlled based on A1c today. Will decrease metformin to 500mg  once daily.       Relevant Medications   metFORMIN (GLUCOPHAGE) 500 MG tablet   Other Relevant Orders   POCT glycosylated hemoglobin (Hb A1C) (Completed)   Constipation    Mild, managed with colace, prunes, metamucil.           Meds ordered this encounter  Medications  . pantoprazole (PROTONIX) 40 MG tablet    Sig: Take 1 tablet (40 mg total) by mouth 2 (two) times daily.    Dispense:  180 tablet    Refill:  1  . metFORMIN (GLUCOPHAGE) 500 MG tablet    Sig: Take 1 tablet (500 mg total) by mouth daily with breakfast.    Dispense:  90 tablet    Refill:  3   Orders Placed This Encounter  Procedures  . POCT glycosylated hemoglobin (Hb A1C)    Patient Instructions  Decrease metformin to 500mg  once daily due to great sugar control!  Protonix (pantoprazole) 40mg  twice daily refilled.  Return in 6 months for wellness visit with labs.    Follow up plan: Return in about 6 months (around 06/06/2020) for follow up visit.  Ria Bush, MD

## 2019-12-08 NOTE — Patient Instructions (Addendum)
Decrease metformin to 500mg  once daily due to great sugar control!  Protonix (pantoprazole) 40mg  twice daily refilled.  Return in 6 months for wellness visit with labs.

## 2019-12-10 ENCOUNTER — Telehealth: Payer: Self-pay

## 2019-12-10 DIAGNOSIS — R278 Other lack of coordination: Secondary | ICD-10-CM | POA: Diagnosis not present

## 2019-12-10 DIAGNOSIS — R2689 Other abnormalities of gait and mobility: Secondary | ICD-10-CM | POA: Diagnosis not present

## 2019-12-10 DIAGNOSIS — M6281 Muscle weakness (generalized): Secondary | ICD-10-CM | POA: Diagnosis not present

## 2019-12-10 NOTE — Telephone Encounter (Signed)
Telephone call to patients significant other [name].  [name] did not answer phone, RN left message requesting call back to schedule palliative care home visit.

## 2019-12-11 ENCOUNTER — Telehealth: Payer: Self-pay

## 2019-12-11 NOTE — Telephone Encounter (Signed)
Telephone call to Stephen [name] schedule palliative care visit to see patient.  Stephen [name] in agreement with RN making home visit 12/14/19 at 3:00 PM.

## 2019-12-14 ENCOUNTER — Other Ambulatory Visit: Payer: Medicare Other

## 2019-12-14 ENCOUNTER — Other Ambulatory Visit: Payer: Self-pay

## 2019-12-14 DIAGNOSIS — Z515 Encounter for palliative care: Secondary | ICD-10-CM

## 2019-12-14 NOTE — Assessment & Plan Note (Addendum)
Thought mixed dementia (alz + vasc) on namenda. Did not tolerate aricept (diarrhea).  [facility] palliative care involved.  Partner notes dementia progression, but no increased level of care needs.

## 2019-12-14 NOTE — Assessment & Plan Note (Signed)
Again heard today.

## 2019-12-14 NOTE — Assessment & Plan Note (Signed)
Doing well on abilify.

## 2019-12-14 NOTE — Assessment & Plan Note (Signed)
Improving with PT. Anticipate OA related.

## 2019-12-14 NOTE — Assessment & Plan Note (Signed)
Mild, managed with colace, prunes, metamucil.

## 2019-12-14 NOTE — Progress Notes (Signed)
PATIENT NAME: Stephen [name] DOB: 04/23/1927 MRN: [id number]  PRIMARY CARE PROVIDER: Ria Bush, MD  RESPONSIBLE PARTY:  Acct ID - Guarantor Home Phone Work Phone Relationship Acct Type  [id number] NEVAAN, BUNTON* [phone]  Self P/F     [address], [place], [place] [address]    PLAN OF CARE and INTERVENTIONS:               1.  GOALS OF CARE/ ADVANCE CARE PLANNING:  Remain in senior living home with significant other [name].  [name] wants patient to be comfortable.                   2.  PATIENT/CAREGIVER EDUCATION:  Education on fall precautions, education on s/s of infection, reviewed meds, support               3.  DISEASE STATUS:RN made scheduled palliative care visit. Patient was walking from kitchen and met nurse at door. Patient's significant other [name] in home with patient. Patient pleasant and cooperative with assessment. Patient reports he continues to have chronic pain in his left knee. Nurse discussed with [name] trying Voltaren cream to help with pain in patients knee.  [name] reports patient has used this in the past and has some in the home.  Patient had appointment with Dr. Danise Mina per [name]. Patient's hemoglobin A1c was slightly over 6 so MD decreased patients Glucophage to  once-daily. [name] reports MD made no other changes in patients medications. MD feels patient needs to see Dr Lucky Cowboy as well as cardiologist annually. Patients appetite remains good and patients current weight is 180 lbs. Patient sleeps well at night and takes a nap during the day. Patient denies having any shortness of breath. [name] reports patient has a cough that sounds wet at times and that cough is worse after patient eats. [name] feels this could be related to patient having a hiatal hernia. Patients BP has been running low. Nurse discussed with [name] patient could be slightly dehydrated as patients fluid intake is decreased. [name] helps patient with dressing and patient remains able to shave  himself. Patient received first dose of covid-19 vaccine. PT has discharged patient as they feel patient has gained strength and able to ambulate with cane rather than walker.  [name] reports she keeps walker beside of bed in case patient needs walker.  Patient is showing no symptoms from nodule that was found in patients lung. Patient care and remain in agreement with palliative care services and both were encouraged to contact palliative care with questions or concerns. Patients memory remains poor and patient looks to [name] to answer questions.       HISTORY OF PRESENT ILLNESS:  Patient is a 86 year old male who resides in home with signifiant other [name].  Patient is followed by palliative care team and is seen monthly and PRN.    CODE STATUS: DNR   ADVANCED DIRECTIVES: Y MOST FORM: Yes PPS: 50%   PHYSICAL EXAM:   VITALS: Today's Vitals   12/14/19 1525  BP: 110/64  Pulse: 70  Resp: 18  Temp: 98.2 F (36.8 C)  TempSrc: Temporal  SpO2: 99%  Weight: 180 lb (81.6 kg)  PainSc: 2   PainLoc: Knee    LUNGS: breath sounds are diminished in the bases CARDIAC: Cor RRR  EXTREMITIES: Trace edema SKIN: Skin color, texture, turgor normal. No rashes or lesions  NEURO: positive for gait problems, memory problems and weakness       Debrah Granderson  Rolland Porter, RN

## 2019-12-14 NOTE — Assessment & Plan Note (Signed)
Palliative care involved.

## 2019-12-14 NOTE — Assessment & Plan Note (Signed)
Concern for bronchogenic carcinoma. S/p tumor board meeting - recommend palliative care for now. Family in agreement.

## 2019-12-14 NOTE — Assessment & Plan Note (Signed)
Chronic, well controlled based on A1c today. Will decrease metformin to 500mg  once daily.

## 2019-12-14 NOTE — Assessment & Plan Note (Addendum)
Large HH on recent EGD. This was likely cause of increased metabolic activity on recent PET. Continue protonix 40mg  BID.

## 2020-01-10 ENCOUNTER — Other Ambulatory Visit: Payer: Self-pay | Admitting: Family Medicine

## 2020-01-11 NOTE — Telephone Encounter (Signed)
Last filled on 07/17/2019 #90 with 1 refill.  LOV 12/08/2019 follow up Next appointment on 06/08/2020 for follow up

## 2020-01-12 NOTE — Telephone Encounter (Signed)
ERx 

## 2020-01-15 ENCOUNTER — Other Ambulatory Visit: Payer: Self-pay | Admitting: Family Medicine

## 2020-01-15 NOTE — Telephone Encounter (Signed)
Name of Medication: Clonazepam [facility] of [facility]acy[facility] or Written Date and Quantity: 12/19/19, #30 Last Office Visit and Type: 12/08/19, f/u Next Office Visit and Type:  06/08/20, 6 mo f/u Last Controlled Substance Agreement Date: none Last UDS: none

## 2020-01-15 NOTE — Telephone Encounter (Signed)
ERx 

## 2020-01-26 ENCOUNTER — Telehealth: Payer: Self-pay

## 2020-01-26 NOTE — Telephone Encounter (Signed)
Telephone call to patient to schedule palliative care visit with patient. Patient/family in agreement with home visit on 02-01-20 at 2:30PM.

## 2020-02-01 ENCOUNTER — Other Ambulatory Visit: Payer: Self-pay

## 2020-02-01 ENCOUNTER — Other Ambulatory Visit: Payer: Medicare Other

## 2020-02-01 DIAGNOSIS — Z515 Encounter for palliative care: Secondary | ICD-10-CM

## 2020-02-01 NOTE — Progress Notes (Signed)
COMMUNITY PALLIATIVE CARE SW NOTE  PATIENT NAME: Stephen [name] DOB: May 24, 1927 MRN: [id number]  PRIMARY CARE PROVIDER: Ria Bush, MD  RESPONSIBLE PARTY:  Acct ID - Guarantor Home Phone Work Phone Relationship Acct Type  [id number] Stephen [name], HIRANO* [phone]  Self P/F     [address], [place],  [address]     PLAN OF CARE and INTERVENTIONS:             1. GOALS OF CARE/ ADVANCE CARE PLANNING:  Patient is DNR, form is in the home. HCPOA is [name] (significant other). Goal is to keep patient at home with [name], and start back at the [facility], when it is open. 2. SOCIAL/EMOTIONAL/SPIRITUAL ASSESSMENT/ INTERVENTIONS:  SW and RN met with patient and [name] in the home. [name] provided update on care. Patient was enjoying hot tea when SW arrived. Patient denies pain. No reported falls. Patient is sleeping well, gets up once to urinate. Patient's appetite is good. Patient has received both COVID vaccines, no concerns. Patient is hopeful to see family soon. Patient is planning to resume the day program at the [facility] and plans to go twice a week. [name] said they should know more about the reopening this week. SW provided emotional support, reviewed care plans and goals and used active and reflective listening. 3. PATIENT/CAREGIVER EDUCATION/ COPING:  Patient was alert. Patient responds when asked a question. Patient was pleasant, smiling. Patient continues to enjoy watching the birds at the feeders outside the window and walking with [name] outside. [name] is looking forward to the day program resuming so she can get more frequent, consistent breaks.  4. PERSONAL EMERGENCY PLAN:  Family will call 9-1-1 for emergencies. 5. COMMUNITY RESOURCES COORDINATION/ HEALTH CARE NAVIGATION:  [name] assists with care coordination and appointments. [name] is trying to reduce patient's 6 month follow-ups with specialists and extend them to yearly. [name] said she discussed this with PCP.  Patient would continue seeing PCP every 6 months and neurologist. Patient is scheduled to see cardiologist in April. 6. FINANCIAL/LEGAL CONCERNS/INTERVENTIONS:  None.     SOCIAL HX:  Social History   Tobacco Use  . Smoking status: Former Smoker    Types: Cigarettes    Quit date: 11/12/1962    Years since quitting: 57.2  . Smokeless tobacco: Never Used  . Tobacco comment: Quit 1964  Substance Use Topics  . Alcohol use: Yes    Alcohol/week: 1.0 standard drinks    Types: 1 Cans of beer per week    Comment: rarely    CODE STATUS:   Code Status: Prior (DNR) ADVANCED DIRECTIVES: Y MOST FORM COMPLETE:  No. HOSPICE EDUCATION PROVIDED: None.  PPS: Patient is mostly independent with ADLs, needs standby for safety and assistance with meal prep. Patient ambulates with cane and is doing well.  I spent45 minutes with patient/family, from2:30-3:15pproviding education, support and consultation.  Margaretmary Lombard, LCSW

## 2020-02-23 ENCOUNTER — Telehealth: Payer: Self-pay

## 2020-02-23 NOTE — Telephone Encounter (Signed)
Telephone call to schedule palliative care visit.  Stephen [name] in agreement with RN making home visit 03-03-20 at 10:00 AM.

## 2020-03-02 IMAGING — CT CT HEAD W/O CM
4 series · 16 of 47 positions shown, 18 images · non-contrast
Comparison: None.

CLINICAL DATA: Acute mental status changes, disoriented.

EXAM:
CT HEAD WITHOUT CONTRAST
TECHNIQUE: Contiguous axial images were obtained from the base of the skull
through the vertex without intravenous contrast.

[Series 2: head wo · axial · 0.47mm/px · z∈[-116,-6]mm · 7 of 30 slices shown, 9 images]
[im 4/30  brain]
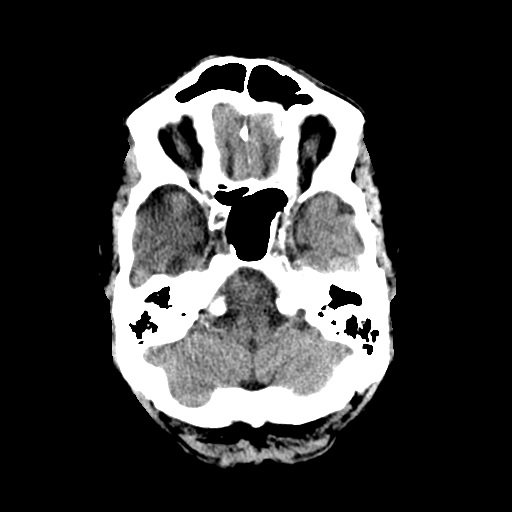
[im 4/30  bone]
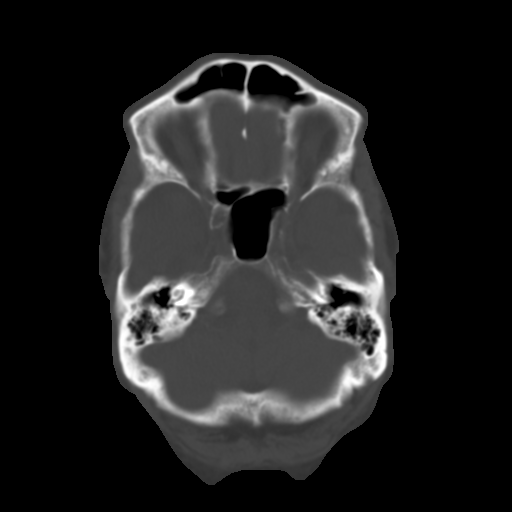
[im 8/30  brain]
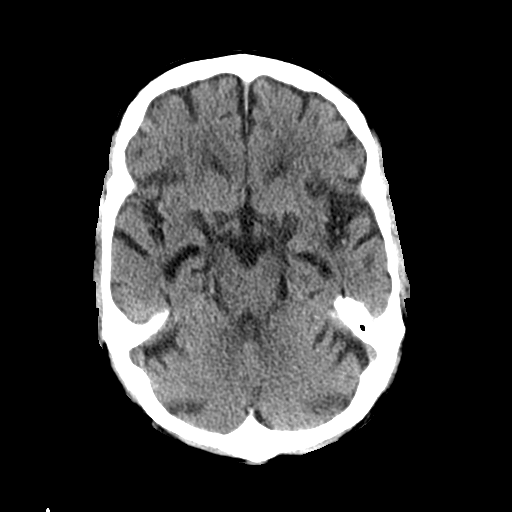
[im 11/30  brain]
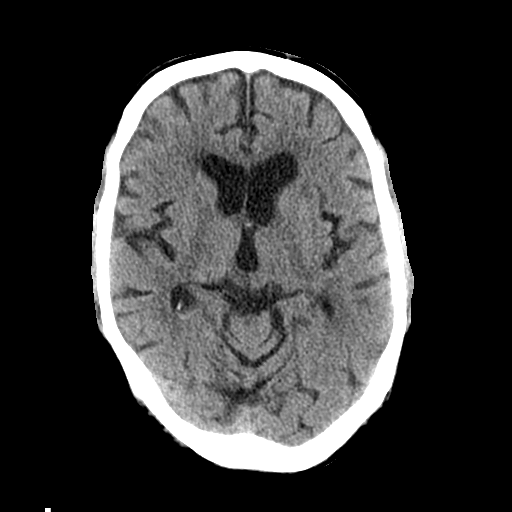
[im 15/30  brain]
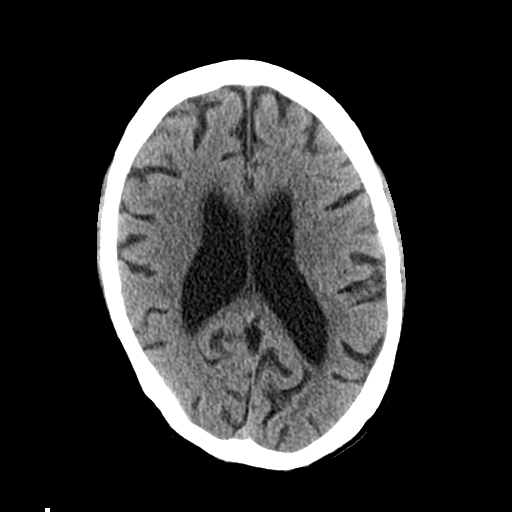
[im 19/30  brain]
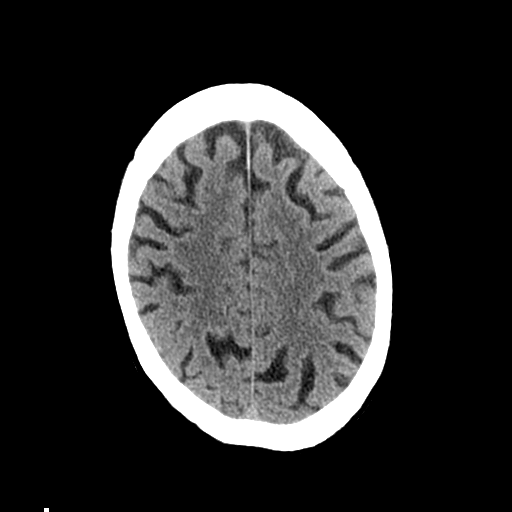
[im 19/30  bone]
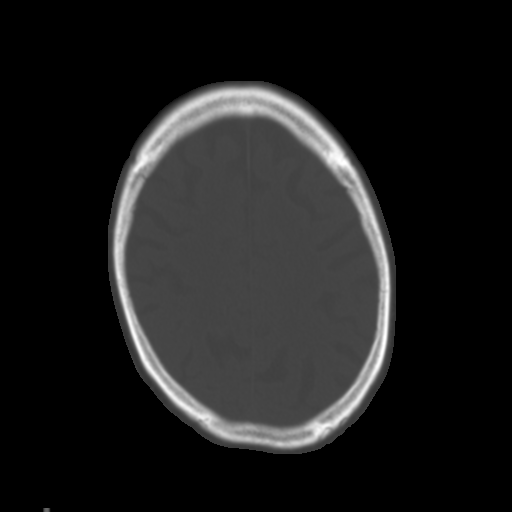
[im 22/30  brain]
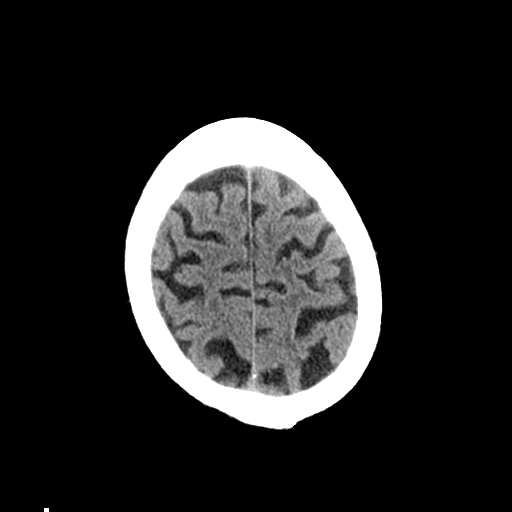
[im 26/30  brain]
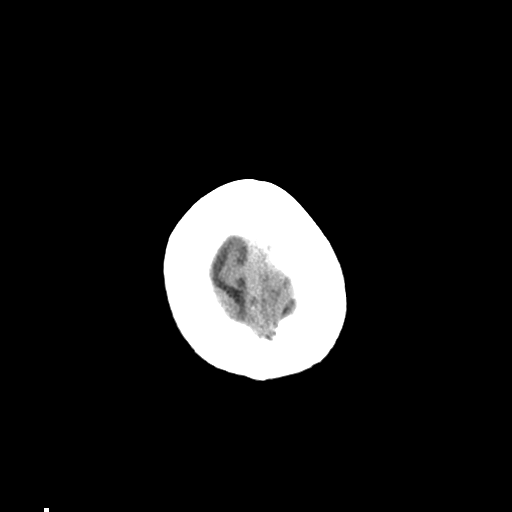

[Series 3: head bone · axial · 0.47mm/px · z∈[-117,-87]mm · 3 of 75 slices shown]
[im 8/75  bone]
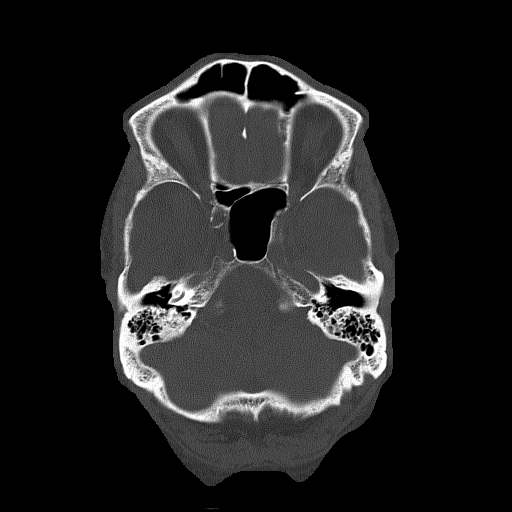
[im 15/75  bone]
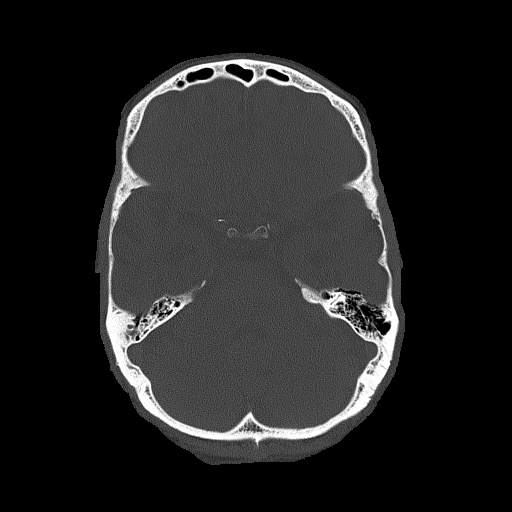
[im 23/75  bone]
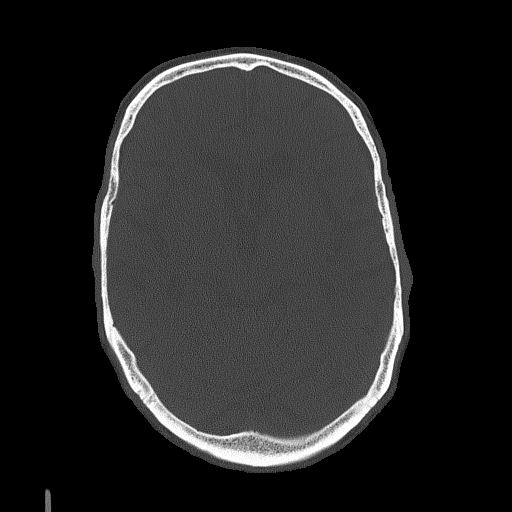

[Series 4: coronal soft tissue · coronal · 0.29mm/px · 3 of 69 slices shown]
[im 23/69  brain]
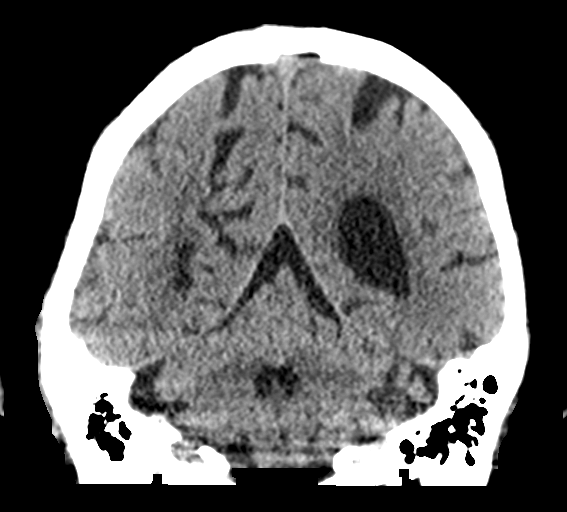
[im 31/69  brain]
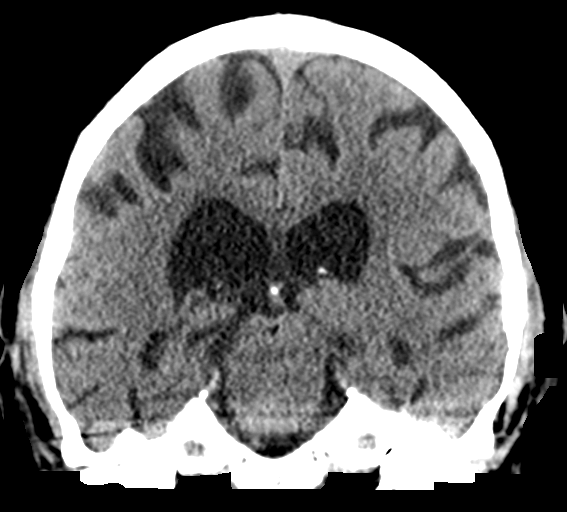
[im 38/69  brain]
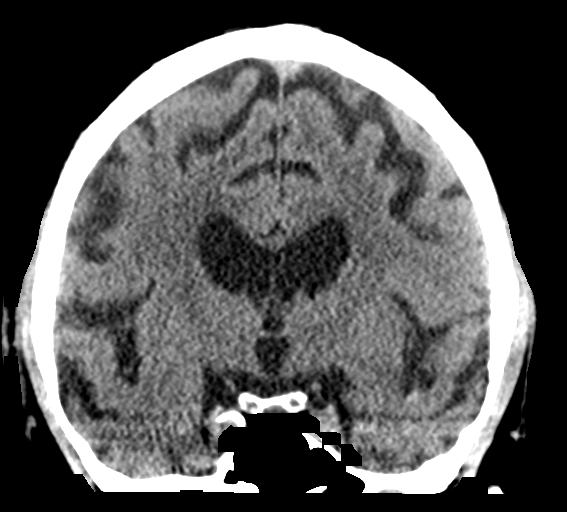

[Series 5: sagittal soft tissue · sagittal · 0.29mm/px · 3 of 55 slices shown]
[im 19/55  brain]
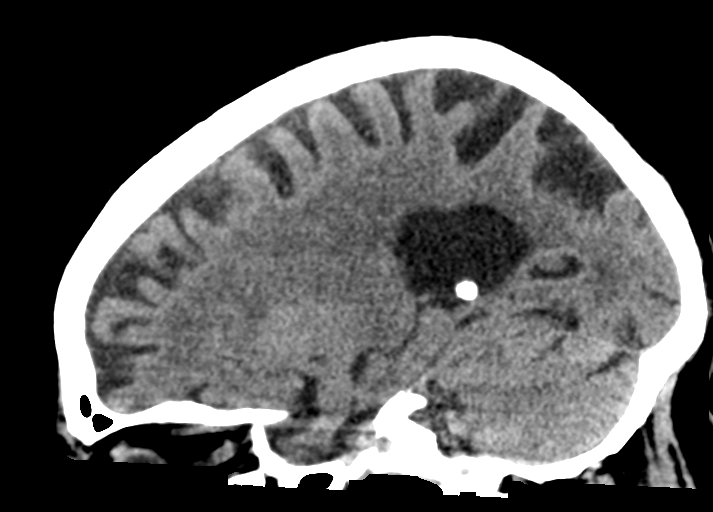
[im 28/55  brain]
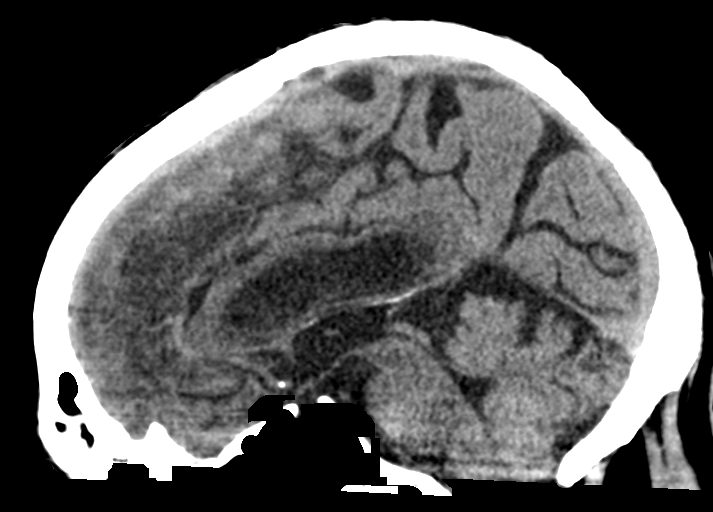
[im 37/55  brain]
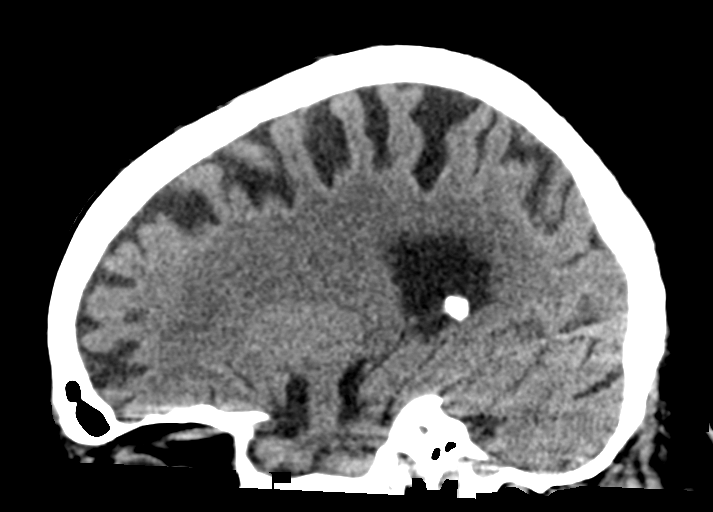

[16 of 47 positions shown; findings below may reference images not displayed]

FINDINGS: Brain: The brain demonstrates diffuse cortical atrophy as well as
mild periventricular small vessel disease. The brain demonstrates no
evidence of hemorrhage, infarction, edema, mass effect, extra-axial
fluid collection, hydrocephalus or mass lesion.

Vascular: No hyperdense vessel or unexpected calcification.

Skull: Normal. Negative for fracture or focal lesion.

Sinuses/Orbits: No acute finding.

Other: None.
IMPRESSION: No acute findings. Diffuse cortical atrophy and mild small vessel
disease.

## 2020-03-02 IMAGING — US US CAROTID DUPLEX BILAT
1 series · 13 of 24 positions shown · non-contrast
Comparison: None.

CLINICAL DATA: Acute cerebral infarction. History of hypertension,
hyperlipidemia, diabetes and coronary artery disease.

EXAM:
BILATERAL CAROTID DUPLEX ULTRASOUND
TECHNIQUE: Gray scale imaging, color Doppler and duplex ultrasound were
performed of bilateral carotid and vertebral arteries in the neck.

[Series 1: us carotid duplex bilat · 13 of 71 slices shown]
[im 1/71]
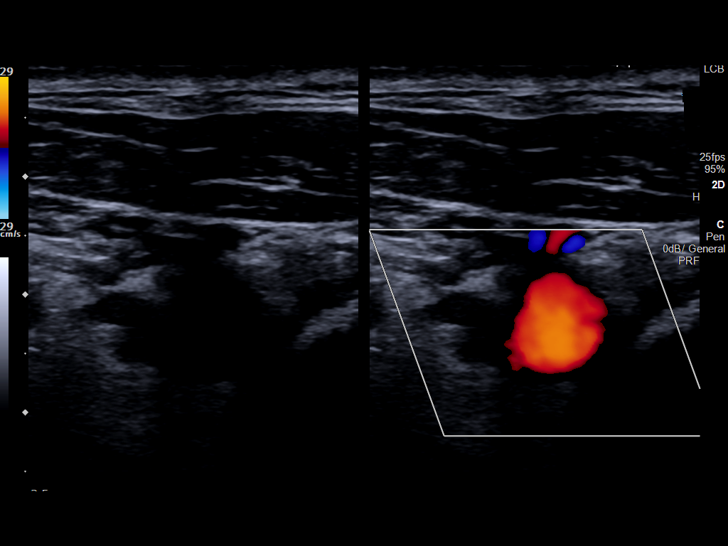
[im 7/71]
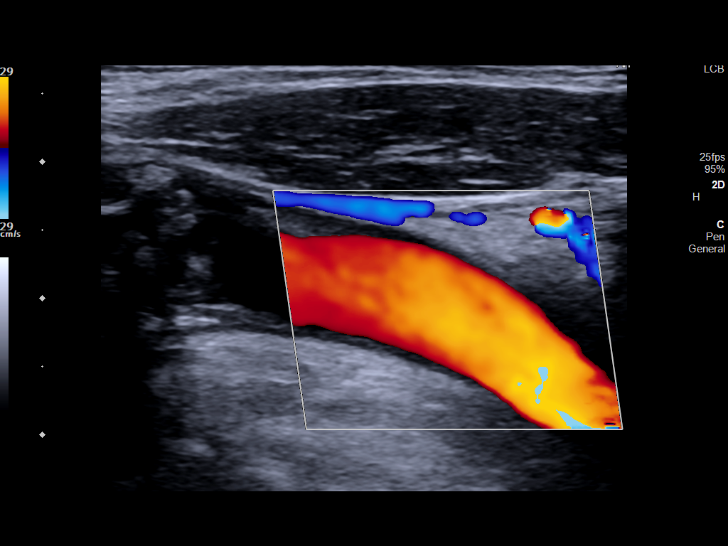
[im 13/71]
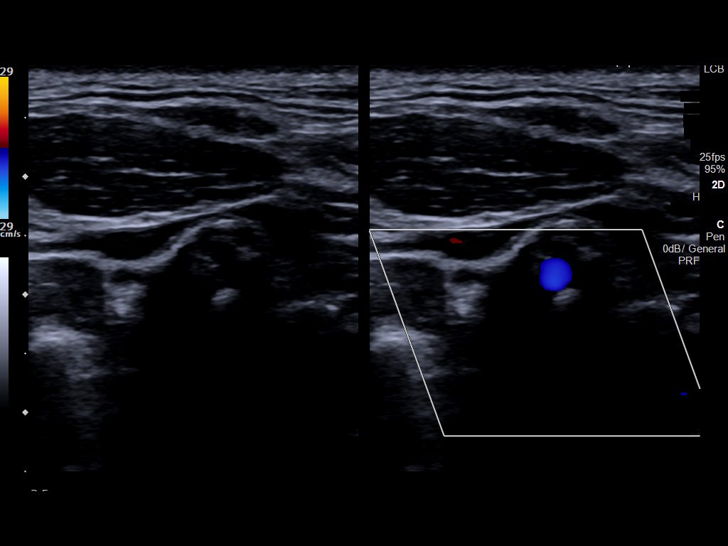
[im 19/71]
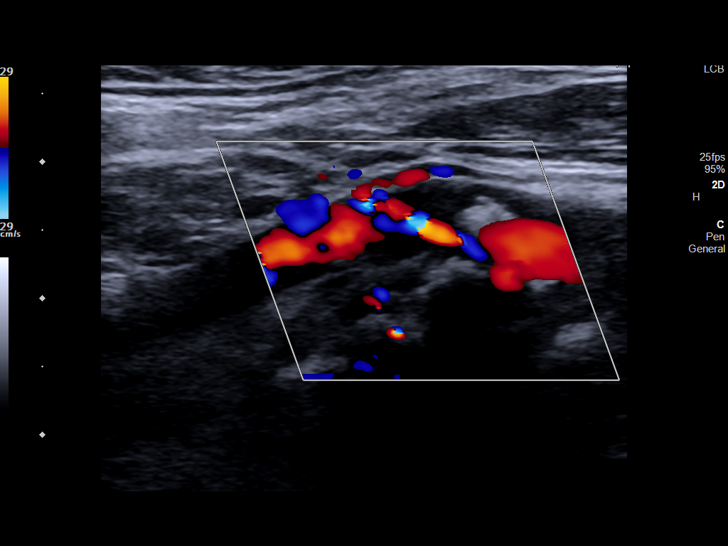
[im 25/71]
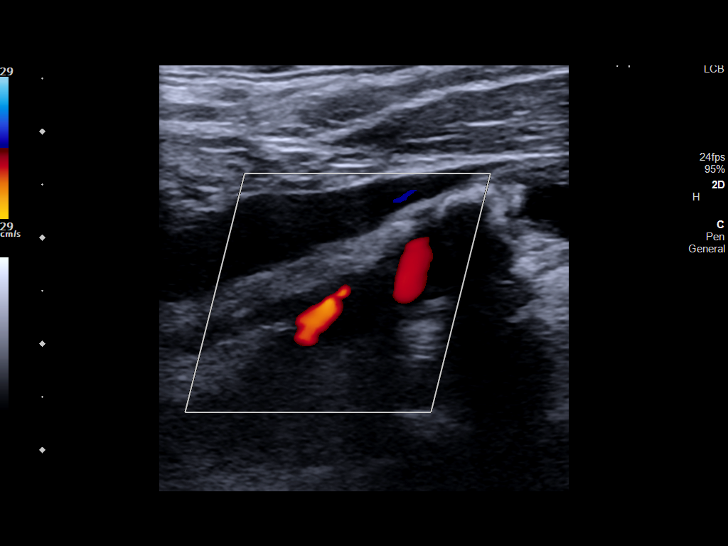
[im 31/71]
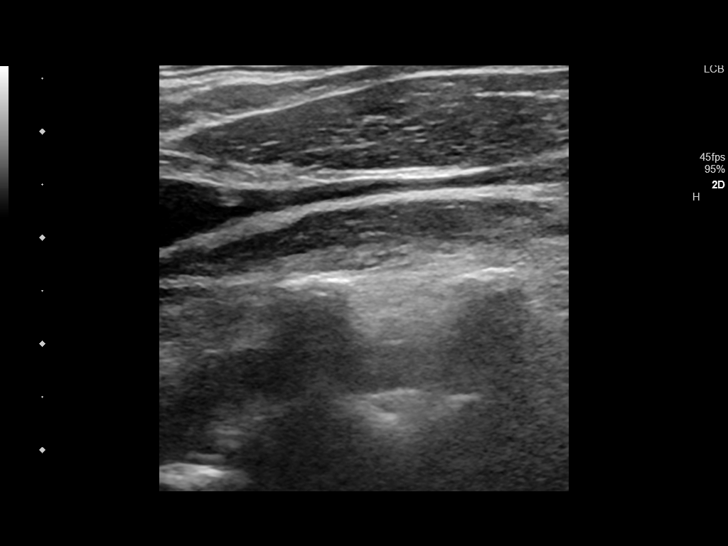
[im 37/71]
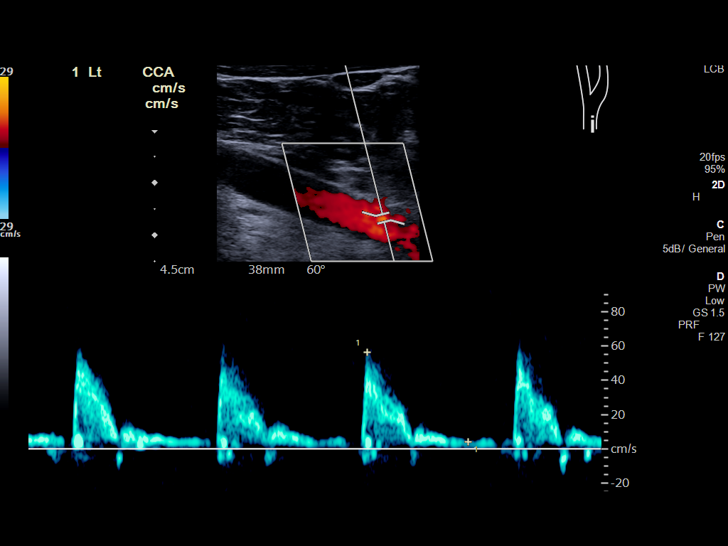
[im 40/71]
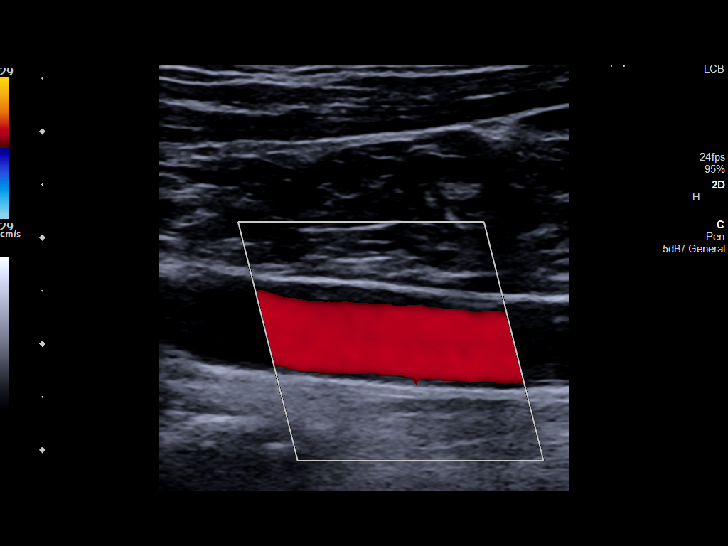
[im 46/71]
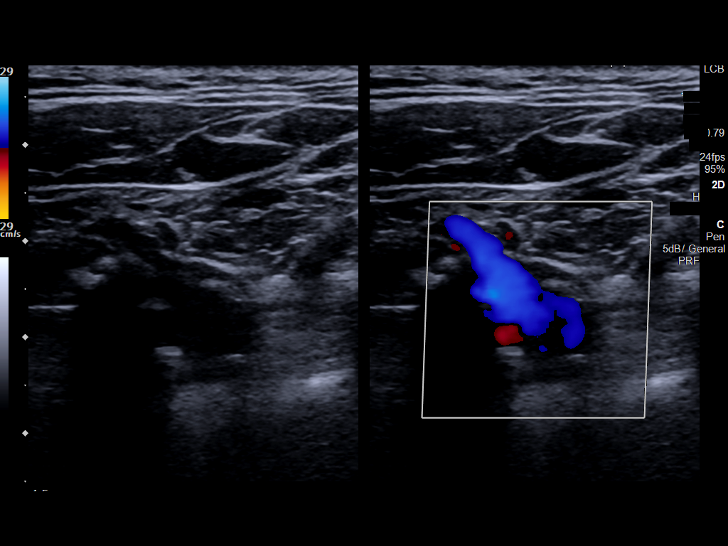
[im 52/71]
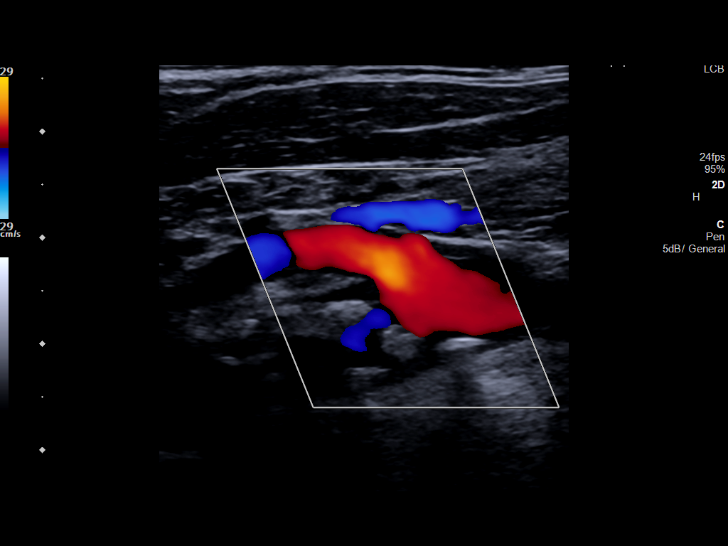
[im 58/71]
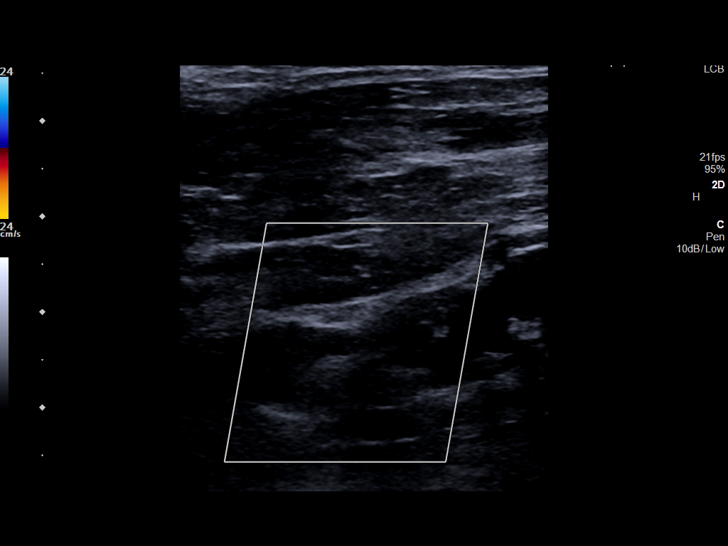
[im 64/71]
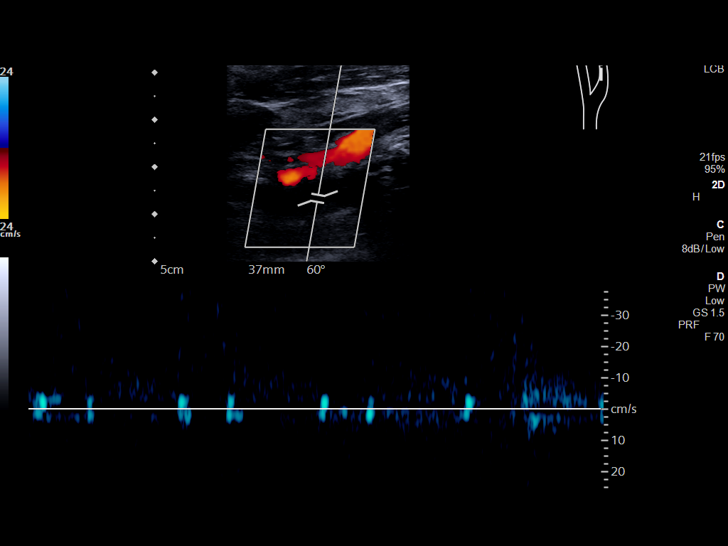
[im 71/71]
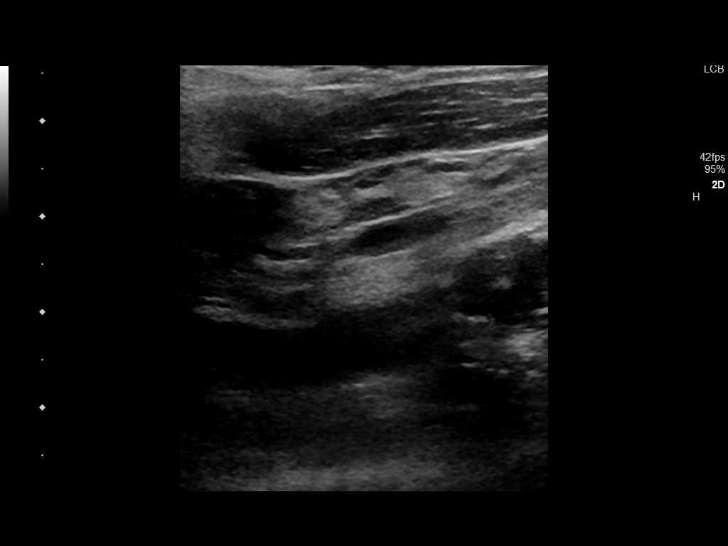

[13 of 24 positions shown; findings below may reference images not displayed]

FINDINGS: Criteria: Quantification of carotid stenosis is based on velocity
parameters that correlate the residual internal carotid diameter
with NASCET-based stenosis levels, using the diameter of the distal
internal carotid lumen as the denominator for stenosis measurement.

The following velocity measurements were obtained:

RIGHT

ICA:  245/58 cm/sec

CCA:  68/17 cm/sec

SYSTOLIC ICA/CCA RATIO:

ECA:  380 cm/sec

LEFT

ICA: [DATE] proximal, mid to distal segments occluded in the neck
cm/sec

CCA:  44/7 cm/sec

ECA:  117 cm/sec

RIGHT CAROTID ARTERY: There is a large amount of both noncalcified
and calcified plaque beginning at the level of the carotid bulb and
extending into internal and external carotid arteries. At the level
of maximal velocities, estimated right ICA stenosis is greater than,
or equal to, 70%.

RIGHT VERTEBRAL ARTERY: Antegrade flow with normal waveform and
velocity.

LEFT CAROTID ARTERY: Only the origin of the left ICA is open and
demonstrates minimal flow. The mid to distal segments in the neck
are completely occluded and show no visible detectable flow.

LEFT VERTEBRAL ARTERY: Antegrade flow with normal waveform and
velocity.
IMPRESSION: 1. Complete occlusion of the left internal carotid artery in the
neck.
2. Significant atherosclerosis at the right carotid bifurcation with
estimated right ICA stenosis of greater than, or equal to, 70%.

## 2020-03-03 ENCOUNTER — Other Ambulatory Visit: Payer: Medicare Other

## 2020-03-03 ENCOUNTER — Other Ambulatory Visit: Payer: Self-pay

## 2020-03-03 DIAGNOSIS — Z515 Encounter for palliative care: Secondary | ICD-10-CM

## 2020-03-03 NOTE — Progress Notes (Signed)
PATIENT NAME: Stephen [name] DOB: Jul 05, 1927 MRN: [id number]  PRIMARY CARE PROVIDER: Ria Bush, MD  RESPONSIBLE PARTY:  Acct ID - Guarantor Home Phone Work Phone Relationship Acct Type  [id number] [name], Stephen* [phone]  Self P/F     [address], [place], [place] [address]    PLAN OF CARE and INTERVENTIONS:               1.  GOALS OF CARE/ ADVANCE CARE PLANNING:  Remain in home with significant other Stephen [name] and return to memory care daycare when it opens.               2.  PATIENT/CAREGIVER EDUCATION: Education on fall precautions, education on s/s of infection, reviewed meds, support               4. PERSONAL EMERGENCY PLAN:  Patient resides in [facility] in town home with significant other Stephen [name].                5.  DISEASE STATUS: RN made scheduled palliative care home visit. Nurse met with patient and his significant other Stephen [name]. Patient sitting in chair with feet elevated. Patient memory is declining more per Stephen [name]. Stephen [name] reports patient is no longer interested in working puzzles and she has to encourage him to do Sudoku puzzles. Patient alert to self and familiars. Patient pleasant and cooperative with assessment. Patient vital signs are stable. Patient current weight is 182 lbs. Patient has not had any MD visits since nurse made last home visit. Stephen [name] reports patient is scheduled to see cardiologist and she is going to make a dermatologist appointment for patient. Patient uses his cane during the day and his walker at night if he gets up to go to the bathroom. Patient has not suffered any recent falls. Patient denies having any shortness of breath. Stephen [name] reports patients does have a dry non productive cough that is she feels his allergy related. Stephen [name] reports patients glucophage was decreased to one time a day as patient's hemoglobin A1c was 6. Patients breath sounds are clear. Patient continues to take Metamucil and stool softeners and is having no difficulty with  bowel movements. Patient has trace edema in his lower extremities. Patient has been sleeping well at night and Stephen [name] reports patient naps after lunch each day. Stephen [name] assists patient with personal care and picks out patients clothes. Patient is always neat and clean. Patient remains a DNR. Patient and Stephen [name] remain in agreement with palliative care services. Patient and Stephen [name] encouraged to contact palliative care with questions or concerns.     HISTORY OF PRESENT ILLNESS:  Patient is a 86 year old make who resides in [facility] with his significant other Stephen [name]. Patient is followed by palliative care and is seen monthly and PRN.     CODE STATUS: DNR  ADVANCED DIRECTIVES: Y MOST FORM: Yes PPS: 50%   PHYSICAL EXAM:   VITALS: Today's Vitals   03/03/20 1232  BP: 128/68  Pulse: 65  Resp: 18  Temp: 97.9 F (36.6 C)  TempSrc: Temporal  SpO2: 98%  Weight: 182 lb (82.6 kg)  PainSc: 0-No pain    LUNGS: clear to auscultation  CARDIAC: Cor RRR  EXTREMITIES: Trace edema SKIN: Skin color, texture, turgor normal. No rashes or lesions  NEURO: positive for gait problems and memory problems       Stephen Simmer, RN

## 2020-03-10 ENCOUNTER — Encounter: Payer: Self-pay | Admitting: Cardiovascular Disease

## 2020-03-10 ENCOUNTER — Ambulatory Visit (INDEPENDENT_AMBULATORY_CARE_PROVIDER_SITE_OTHER): Payer: Medicare Other | Admitting: Cardiovascular Disease

## 2020-03-10 ENCOUNTER — Other Ambulatory Visit: Payer: Self-pay

## 2020-03-10 VITALS — BP 120/70 | HR 65 | Ht 69.0 in | Wt 185.0 lb

## 2020-03-10 DIAGNOSIS — I779 Disorder of arteries and arterioles, unspecified: Secondary | ICD-10-CM

## 2020-03-10 DIAGNOSIS — I2510 Atherosclerotic heart disease of native coronary artery without angina pectoris: Secondary | ICD-10-CM

## 2020-03-10 DIAGNOSIS — E785 Hyperlipidemia, unspecified: Secondary | ICD-10-CM

## 2020-03-10 NOTE — Progress Notes (Signed)
Cardiology Office Note   Date:  03/10/2020   ID:  Stephen [name], DOB Jul 15, 1927, MRN [id number]  PCP:  Ria Bush, MD  Cardiologist:   Kathlyn Sacramento, MD   Chief Complaint  Patient presents with  . office visit    6 month f/u. Meds reviewed verbally with pt.      History of Present Illness: Stephen [name] is a 86 y.o. male who presents for a followup visit regarding coronary artery disease. He had inferior ST elevation myocardial infarction in May 2015. Emergent cardiac catheterization showed an occluded mid RCA which was treated successfully with PCI and drug-eluting stent placement. Ejection fraction was 45%. He has chronic medical conditions that include type 2 diabetes, hypertension and hyperlipidemia. He is a vegetarian since age 65 and lives at [facility]. He didn't tolerate Atorvastatin to myalgia.  He was hospitalized in May 2019 with a stroke.  He was found to have an occluded left carotid artery with significant stenosis of the right carotid artery.  Echo showed normal LV systolic function.  He was hospitalized in June 2019 with atypical chest pain.  Lexiscan Myoview showed fixed inferior wall defect with no ischemia. He had another TIA in July 2019.  He underwent right carotid artery stenting.  He has progressive memory decline due to dementia.  In addition, he was diagnosed with lung mass since last visit highly suspicious for cancer.  It was decided not to pursue treatment given his age and comorbidities.  He has been doing well with no reported chest pain or shortness of breath.  Past Medical History:  Diagnosis Date  . Arthritis of knee   . BPH (benign prostatic hypertrophy)    w/ nocturia  . Coronary artery disease 03/2014   Inferior ST elevation myocardial infarction. Cardiac catheterization showed an occluded mid RCA. He had an angioplasty and drug-eluting stent placement with a 3.0 x 16 mm Promus drug-eluting stent. Ejection  fraction was 45% by echo, completed cardiac rehab 06/2014  . CVA (cerebral vascular accident) ([place]) 03/2018  . Dementia ([place])    s/p stroke  . Diabetes type 2, controlled ([place])    Pt is taking Metformin  . Heart murmur    keeping an eye on it but not treating  . History of basal cell cancer    s/p mohs  . History of colon polyps   . History of hiatal hernia 05/2018   this causes laryngeal spasms. pt was taking nitro for this when bp drops and he ends up with tia symptoms  . History of hypertension   . HLD (hyperlipidemia)    diet controlled in past  . Hypertension   . Insomnia    treated with multiple meds in past  . Lung cancer ([place])   . MI (myocardial infarction) ([place]) 2015  . Rosacea   . Skin cancer   . Sleep apnea    has cpap but does not use properly and does not like it!!  . Stroke ([place]) 03/2018   per cat scan, patient has had several strokes  . TIA (transient ischemic attack)    x 2 since CVA 03-2018  . UTI (urinary tract infection)     Past Surgical History:  Procedure Laterality Date  . CARDIAC CATHETERIZATION  03/2014   [facility];x1 stent  . CAROTID PTA/STENT INTERVENTION Right 05/2018   [facility]  . CAROTID PTA/STENT INTERVENTION Right 06/05/2018   Procedure: CAROTID PTA/STENT INTERVENTION;  Surgeon: Algernon Huxley, MD;  Location: [facility];  Service: Cardiovascular;  Laterality: Right;  . CATARACT EXTRACTION W/PHACO Left 10/09/2016   Procedure: CATARACT EXTRACTION PHACO AND INTRAOCULAR LENS PLACEMENT (IOC);  Surgeon: Birder Robson, MD;  Location: [facility] OR[facility];  Service: Ophthalmology;  Laterality: Left;  [place] 1.13 AP% 18.3 CDE 13.45 Fluid pack lot # [id number]  . COLONOSCOPY  08/2010   hyperplastic polyp, rec rpt 5 yrs  . ESOPHAGOGASTRODUODENOSCOPY  05/2015   dilated stricture, normal biopsies, HH, no definite infection [name] @ [facility])  . ESOPHAGOGASTRODUODENOSCOPY (EGD) WITH PROPOFOL N/A 09/18/2019   Procedure: ESOPHAGOGASTRODUODENOSCOPY (EGD) WITH PROPOFOL;  Surgeon:  [name], MD;  Location: [facility] ENDOSCOPY;  Service: Gastroenterology;  Laterality: N/A;  . EYE SURGERY Left    cataract extraction  . MOHS SURGERY     basal cell chin/back  . REPLACEMENT TOTAL KNEE Left 08/2006  . VASECTOMY  1970     Current Outpatient Medications  Medication Sig Dispense Refill  . acetaminophen (TYLENOL) 500 MG tablet Take 1 tablet (500 mg total) by mouth 2 (two) times daily. (Patient taking differently: Take 1,000 mg by mouth 2 (two) times daily. ) 30 tablet 0  . ARIPiprazole (ABILIFY) 2 MG tablet Take 1 tablet by mouth daily.    Marland Kitchen aspirin EC 81 MG tablet Take 81 mg by mouth every morning.    . clonazePAM (KLONOPIN) 1 MG tablet TAKE 1/2-1 TABLET BY MOUTH AT BEDTIME 30 tablet 1  . clopidogrel (PLAVIX) 75 MG tablet TAKE 1 TABLET (75 MG TOTAL) BY MOUTH AT BEDTIME. 90 tablet 1  . finasteride (PROSCAR) 5 MG tablet TAKE 1 TABLET BY MOUTH EVERY DAY 90 tablet 1  . memantine (NAMENDA) 10 MG tablet TAKE 1 TABLET BY MOUTH TWICE A DAY 180 tablet 2  . metFORMIN (GLUCOPHAGE) 500 MG tablet Take 1 tablet (500 mg total) by mouth daily with breakfast. 90 tablet 3  . Multiple Vitamin (MULTIVITAMIN WITH MINERALS) TABS tablet Take 1 tablet by mouth daily.    . pantoprazole (PROTONIX) 40 MG tablet Take 1 tablet (40 mg total) by mouth 2 (two) times daily. 180 tablet 1  . pravastatin (PRAVACHOL) 40 MG tablet TAKE 1 TABLET BY MOUTH EVERY DAY 90 tablet 1  . vitamin B-12 (CYANOCOBALAMIN) 500 MCG tablet Take 500 mcg by mouth daily.     No current facility-administered medications for this visit.    Allergies:   Aricept [donepezil hcl], Belsomra [suvorexant], Vancomycin, Ambien [zolpidem tartrate], Atorvastatin, Doxycycline, Penicillins, and Tamsulosin    Social History:  The patient  reports that he quit smoking about 57 years ago. His smoking use included cigarettes. He has never used smokeless tobacco. He reports current alcohol use of about 1.0 standard drinks of alcohol per week. He  reports that he does not use drugs.   Family History:  The patient's family history includes CAD in his brother; CAD (age of onset: 36) in his brother; COPD in his brother; Cancer in his mother; Cancer (age of onset: 81) in his father; Parkinson's disease in his brother; Stroke in his sister.    ROS:  Please see the history of present illness.   Otherwise, review of systems are positive for none.   All other systems are reviewed and negative.    PHYSICAL EXAM: VS:  BP 120/70 (BP Location: Right Arm, Patient Position: Sitting, Cuff Size: Normal)   Pulse 65   Ht 5\' 9"  (1.753 m)   Wt 185 lb (83.9 kg)   SpO2 92%   BMI 27.32 kg/m  ,  BMI Body mass index is 27.32 kg/m. GEN: Well nourished, well developed, in no acute distress  HEENT: normal  Neck: no JVD, carotid bruits, or masses Cardiac: RRR; no rubs, or gallops,no edema . There is a 2/6 systolic ejection murmur in the aortic area Respiratory:  clear to auscultation bilaterally, normal work of breathing GI: soft, nontender, nondistended, + BS MS: no deformity or atrophy  Skin: warm and dry, no rash Neuro:  Strength and sensation are intact Psych: euthymic mood, full affect   EKG:  EKG is ordered today. The ekg ordered today demonstrates normal sinus rhythm with old inferior infarct.   Recent Labs: 05/06/2019: Creatinine, Ser 1.20    Lipid Panel    Component Value Date/Time   CHOL 123 01/22/2019 1239   CHOL 124 05/04/2014 0806   TRIG 190.0 (H) 01/22/2019 1239   HDL 49.80 01/22/2019 1239   HDL 53 05/04/2014 0806   CHOLHDL 2 01/22/2019 1239   VLDL 38.0 01/22/2019 1239   LDLCALC 35 01/22/2019 1239   LDLCALC 48 05/04/2014 0806      Wt Readings from Last 3 Encounters:  03/10/20 185 lb (83.9 kg)  03/03/20 182 lb (82.6 kg)  12/14/19 180 lb (81.6 kg)       ASSESSMENT AND PLAN:  1.  Coronary artery disease involving native coronary arteries without angina: Overall, he is doing well with no anginal symptoms. I recommend  continuing medical therapy.  He continues to be on dual antiplatelet therapy given previous coronary and carotid stenting.  2.  Carotid artery disease: Known occluded left carotid artery.  He is status post right carotid artery stenting.  3. Hyperlipidemia: Currently on pravastatin with most recent LDL of 35  4.  Essential hypertension: Blood pressure is well controlled.  5.  Dementia: Gradual decline in memory but he continues to be alert and pleasant.  6.  Lung mass suspicious for cancer: Palliative care following.   Disposition:   FU with me in 6 months  Signed,  Kathlyn Sacramento, MD  03/10/2020 3:10 PM    [place]

## 2020-03-10 NOTE — Patient Instructions (Signed)

## 2020-03-16 ENCOUNTER — Other Ambulatory Visit: Payer: Self-pay | Admitting: Family Medicine

## 2020-03-16 NOTE — Telephone Encounter (Signed)
Name of Medication: Clonazepam Name of [facility]: [facility] [place] [place] or Written Date and Quantity: 02/16/20, #30 Last Office Visit and Type: 12/08/19, 6 mo f/u;  06/02/19, AWV Next Office Visit and Type: 06/08/20, 6 mo f/u Last Controlled Substance Agreement Date: none Last UDS: none

## 2020-03-17 NOTE — Telephone Encounter (Signed)
ERx 

## 2020-03-22 DIAGNOSIS — L718 Other rosacea: Secondary | ICD-10-CM | POA: Diagnosis not present

## 2020-03-22 DIAGNOSIS — D485 Neoplasm of uncertain behavior of skin: Secondary | ICD-10-CM | POA: Diagnosis not present

## 2020-03-22 DIAGNOSIS — L570 Actinic keratosis: Secondary | ICD-10-CM | POA: Diagnosis not present

## 2020-03-22 DIAGNOSIS — X32XXXA Exposure to sunlight, initial encounter: Secondary | ICD-10-CM | POA: Diagnosis not present

## 2020-03-30 ENCOUNTER — Telehealth: Payer: Medicare Other

## 2020-03-31 ENCOUNTER — Telehealth: Payer: Self-pay

## 2020-03-31 NOTE — Telephone Encounter (Signed)
Telephone call to [name] to schedule palliative care visit.  RN left message requesting call back to schedule palliative care visit.

## 2020-03-31 NOTE — Telephone Encounter (Signed)
Telephone call to patients significant other [name] to schedule palliative care visit.  [name] in agreement with RN making home visit 04/04/20 at 12:30 PM.

## 2020-04-01 DIAGNOSIS — H2511 Age-related nuclear cataract, right eye: Secondary | ICD-10-CM | POA: Diagnosis not present

## 2020-04-04 ENCOUNTER — Other Ambulatory Visit: Payer: Self-pay

## 2020-04-04 ENCOUNTER — Other Ambulatory Visit: Payer: Medicare Other

## 2020-04-04 DIAGNOSIS — Z515 Encounter for palliative care: Secondary | ICD-10-CM

## 2020-04-04 NOTE — Progress Notes (Signed)
PATIENT NAME: Stephen [name] DOB: [birth year] MRN: [id number]  PRIMARY CARE PROVIDER: Ria Bush, MD  RESPONSIBLE PARTY:  Acct ID - Guarantor Home Phone Work Phone Relationship Acct Type  [id number] Stephen [name]* [phone]  Self P/F     [address], [place], [place] [address]    PLAN OF CARE and INTERVENTIONS:               1.  GOALS OF CARE/ ADVANCE CARE PLANNING:  Remain at home with significant other Stephen [name] and be comfortable.                2.  PATIENT/CAREGIVER EDUCATION: Education on fall precautions, education on disease progression, education on s/s of infection, support               3.  DISEASE STATUS: RN made scheduled palliative care home visit. Nurse met with patient and his significant other Stephen [name].   Patient scheduled to have cataract surgery in July per Stephen [name]. Patient saw cardiologist and Stephen [name] reports MD made no changes in patients current medications. Stephen [name] feels patient is having more issues with memory and patient is not ambulating as well as he was a month ago. Stephen [name] reports patient lost his glasses a few weeks ago and they looked for them everywhere for 2 days. Stephen [name] reports patient got up one morning and had his glasses on and she has no idea where he had them. Patient denies pain at the present time. Patient is ambulating with his cane and Stephen [name]'s assist.  Stephen [name] reports patient suffered a fall one evening but did not suffer any injuries. Patient continues to meet with a coffee group a couple times a week. Stephen [name] reports patients appetite is good. Patients current weight 185 pounds. Patient denies having any shortness of breath however patient has a dry non productive cough. Stephen [name] feels patients cough is allergy related. Stephen [name] continues to assist patient with personal care. Patients vital signs are stable. Stephen [name] states patients daughter visited a few weeks ago and patient enjoyed daughter visiting. Stephen [name] states daughter may return for a visit near Father's Day.  Patient states he has been sleeping well at night. Stephen [name] asked if patient should have follow up with Doctor Stephen [name] at [facility] to follow up on lung nodule. Education to Stephen [name] this may be a good idea if nodule is increasing in size and nodule is malignant patient would likely qualify for hospice. Stephen [name] to call and speak with staff at [facility]. Patient and Stephen [name] remain in agreement with palliative care services. Stephen [name] reports patient has a upcoming MD appointment with neurologist.  Patient and Stephen [name] encouraged to contact palliative care with questions or concerns.      HISTORY OF PRESENT ILLNESS:  Patient is a 86 year old male who resides at [facility].  Patient is followed by palliative care and is seen monthly and PRN.    CODE STATUS: DNR  ADVANCED DIRECTIVES: Y MOST FORM: Yes PPS: 40%   PHYSICAL EXAM:   VITALS: Today's Vitals   04/04/20 1255  BP: 116/78  Pulse: 67  Resp: 16  Temp: 98.3 F (36.8 C)  TempSrc: Oral  SpO2: 98%  Weight: 185 lb (83.9 kg)  PainSc: 0-No pain    LUNGS: clear to auscultation  CARDIAC: Cor RRR  EXTREMITIES: Trace edema SKIN: Skin color, texture, turgor normal. No rashes or lesions  NEURO: positive for gait problems and memory problems       Nilda Simmer, RN

## 2020-04-07 DIAGNOSIS — G4733 Obstructive sleep apnea (adult) (pediatric): Secondary | ICD-10-CM | POA: Diagnosis not present

## 2020-04-07 DIAGNOSIS — R2689 Other abnormalities of gait and mobility: Secondary | ICD-10-CM | POA: Diagnosis not present

## 2020-04-07 DIAGNOSIS — I63512 Cerebral infarction due to unspecified occlusion or stenosis of left middle cerebral artery: Secondary | ICD-10-CM | POA: Diagnosis not present

## 2020-04-07 DIAGNOSIS — R2681 Unsteadiness on feet: Secondary | ICD-10-CM | POA: Diagnosis not present

## 2020-04-07 DIAGNOSIS — G309 Alzheimer's disease, unspecified: Secondary | ICD-10-CM | POA: Diagnosis not present

## 2020-04-07 DIAGNOSIS — F0150 Vascular dementia without behavioral disturbance: Secondary | ICD-10-CM | POA: Diagnosis not present

## 2020-04-07 DIAGNOSIS — F0280 Dementia in other diseases classified elsewhere without behavioral disturbance: Secondary | ICD-10-CM | POA: Diagnosis not present

## 2020-04-22 ENCOUNTER — Encounter: Payer: Self-pay | Admitting: Family Medicine

## 2020-04-22 ENCOUNTER — Ambulatory Visit (INDEPENDENT_AMBULATORY_CARE_PROVIDER_SITE_OTHER): Payer: Medicare Other | Admitting: Family Medicine

## 2020-04-22 ENCOUNTER — Other Ambulatory Visit: Payer: Self-pay

## 2020-04-22 VITALS — BP 138/84 | HR 73 | Ht 68.0 in | Wt 188.0 lb

## 2020-04-22 DIAGNOSIS — E7800 Pure hypercholesterolemia, unspecified: Secondary | ICD-10-CM | POA: Insufficient documentation

## 2020-04-22 DIAGNOSIS — F0150 Vascular dementia without behavioral disturbance: Secondary | ICD-10-CM

## 2020-04-22 DIAGNOSIS — G309 Alzheimer's disease, unspecified: Secondary | ICD-10-CM | POA: Diagnosis not present

## 2020-04-22 DIAGNOSIS — R011 Cardiac murmur, unspecified: Secondary | ICD-10-CM | POA: Diagnosis not present

## 2020-04-22 DIAGNOSIS — Z8673 Personal history of transient ischemic attack (TIA), and cerebral infarction without residual deficits: Secondary | ICD-10-CM | POA: Diagnosis not present

## 2020-04-22 DIAGNOSIS — I63239 Cerebral infarction due to unspecified occlusion or stenosis of unspecified carotid arteries: Secondary | ICD-10-CM

## 2020-04-22 DIAGNOSIS — F0280 Dementia in other diseases classified elsewhere without behavioral disturbance: Secondary | ICD-10-CM | POA: Diagnosis not present

## 2020-04-22 DIAGNOSIS — J8410 Pulmonary fibrosis, unspecified: Secondary | ICD-10-CM

## 2020-04-22 DIAGNOSIS — I10 Essential (primary) hypertension: Secondary | ICD-10-CM | POA: Insufficient documentation

## 2020-04-22 NOTE — Assessment & Plan Note (Addendum)
Appreciate neurology care - continue namenda 10mg  bid. Planning to start memory daycare 2d/wk at [facility] - forms filled out today. He will have TB test done at center.

## 2020-04-22 NOTE — Assessment & Plan Note (Signed)
By prior CT. Pt largely asymptomatic.

## 2020-04-22 NOTE — Assessment & Plan Note (Signed)
Persists.

## 2020-04-22 NOTE — Progress Notes (Signed)
This visit was conducted in person.  BP 138/84   Pulse 73   Ht 5\' 8"  (1.727 m)   Wt 188 lb (85.3 kg)   SpO2 99%   BMI 28.59 kg/m    CC: discuss starting daycare  Subjective:    Patient ID: Stephen [name], male    DOB: [birth year], 86 y.o.   MRN: [id number]  HPI: EULA MAZZOLA is a 86 y.o. male presenting on 04/22/2020 for Memory Loss   Known mixed vascular and alz dementia on aspirin, plavix, pravastatin, namenda and abilify 2mg  daily. Planning to try audio books per neurology Manuella Ghazi) recommendation.   Overall doing well on current regimen.   Planning to start memory day care at [facility] 2d/wk 9am-2:30pm, lunch provided. They requests filled out. TB test will be formed at [facility]. They are excited this is restarting after long isolated COVID pandemic.      Relevant past medical, surgical, family and social history reviewed and updated as indicated. Interim medical history since our last visit reviewed. Allergies and medications reviewed and updated. Outpatient Medications Prior to Visit  Medication Sig Dispense Refill  . acetaminophen (TYLENOL) 500 MG tablet Take 1 tablet (500 mg total) by mouth 2 (two) times daily. (Patient taking differently: Take 1,000 mg by mouth 2 (two) times daily. ) 30 tablet 0  . ARIPiprazole (ABILIFY) 2 MG tablet Take 1 tablet by mouth daily.    Marland Kitchen aspirin EC 81 MG tablet Take 81 mg by mouth every morning.    . clonazePAM (KLONOPIN) 1 MG tablet TAKE 1/2 TO 1 TABLET BY MOUTH AT BEDTIME 30 tablet 1  . clopidogrel (PLAVIX) 75 MG tablet TAKE 1 TABLET (75 MG TOTAL) BY MOUTH AT BEDTIME. 90 tablet 1  . finasteride (PROSCAR) 5 MG tablet TAKE 1 TABLET BY MOUTH EVERY DAY 90 tablet 0  . memantine (NAMENDA) 10 MG tablet TAKE 1 TABLET BY MOUTH TWICE A DAY 180 tablet 2  . metFORMIN (GLUCOPHAGE) 500 MG tablet Take 1 tablet (500 mg total) by mouth daily with breakfast. 90 tablet 3  . metroNIDAZOLE (METROGEL) 1 % gel Apply 1 application topically  daily.    . Multiple Vitamin (MULTIVITAMIN WITH MINERALS) TABS tablet Take 1 tablet by mouth daily.    . pantoprazole (PROTONIX) 40 MG tablet Take 1 tablet (40 mg total) by mouth 2 (two) times daily. 180 tablet 1  . pravastatin (PRAVACHOL) 40 MG tablet TAKE 1 TABLET BY MOUTH EVERY DAY 90 tablet 1  . vitamin B-12 (CYANOCOBALAMIN) 500 MCG tablet Take 500 mcg by mouth daily.     No facility-administered medications prior to visit.     Per HPI unless specifically indicated in ROS section below Review of Systems Objective:  BP 138/84   Pulse 73   Ht 5\' 8"  (1.727 m)   Wt 188 lb (85.3 kg)   SpO2 99%   BMI 28.59 kg/m   Wt Readings from Last 3 Encounters:  04/22/20 188 lb (85.3 kg)  04/04/20 185 lb (83.9 kg)  03/10/20 185 lb (83.9 kg)      Physical Exam Vitals and nursing note reviewed.  Constitutional:      Appearance: Normal appearance. He is not ill-appearing.  Eyes:     Extraocular Movements: Extraocular movements intact.     Pupils: Pupils are equal, round, and reactive to light.  Cardiovascular:     Rate and Rhythm: Normal rate and regular rhythm.     Pulses: Normal pulses.  Heart sounds: Murmur (3/6 systolic best at USB) heard.   Pulmonary:     Effort: Pulmonary effort is normal. No respiratory distress.     Breath sounds: Normal breath sounds. No wheezing, rhonchi or rales.  Musculoskeletal:     Right lower leg: No edema.     Left lower leg: No edema.  Neurological:     Mental Status: He is alert.  Psychiatric:        Mood and Affect: Mood normal.        Behavior: Behavior normal.       Results for orders placed or performed in visit on 12/08/19  POCT glycosylated hemoglobin (Hb A1C)  Result Value Ref Range   Hemoglobin A1C 6.1 (A) 4.0 - 5.6 %   HbA1c POC (<> result, manual entry)     HbA1c, POC (prediabetic range)     HbA1c, POC (controlled diabetic range)     Assessment & Plan:  This visit occurred during the SARS-CoV-2 public health emergency.  Safety  protocols were in place, including screening questions prior to the visit, additional usage of staff PPE, and extensive cleaning of exam room while observing appropriate contact time as indicated for disinfecting solutions.   Problem List Items Addressed This Visit    Systolic murmur    Persists.       Pulmonary interstitial fibrosis ([place])    By prior CT. Pt largely asymptomatic.       Mixed Alzheimer's and vascular dementia [facility])    Appreciate neurology care - continue namenda 10mg  bid. Planning to start memory daycare 2d/wk at [facility] - forms filled out today. He will have TB test done at center.      History of ischemic left MCA stroke - Primary   Carotid stenosis, symptomatic, with infarction ([place])       No orders of the defined types were placed in this encounter.  No orders of the defined types were placed in this encounter.   Patient Instructions  Good to see you. [facility] forms filled out today.   Follow up plan: Return if symptoms worsen or fail to improve.  Ria Bush, MD

## 2020-04-22 NOTE — Patient Instructions (Signed)
Good to see you. [facility] forms filled out today.

## 2020-04-25 ENCOUNTER — Telehealth: Payer: Self-pay

## 2020-04-25 ENCOUNTER — Ambulatory Visit (INDEPENDENT_AMBULATORY_CARE_PROVIDER_SITE_OTHER): Payer: Medicare Other

## 2020-04-25 ENCOUNTER — Ambulatory Visit (INDEPENDENT_AMBULATORY_CARE_PROVIDER_SITE_OTHER): Payer: Medicare Other | Admitting: Vascular Surgery

## 2020-04-25 ENCOUNTER — Encounter (INDEPENDENT_AMBULATORY_CARE_PROVIDER_SITE_OTHER): Payer: Self-pay | Admitting: Vascular Surgery

## 2020-04-25 ENCOUNTER — Other Ambulatory Visit: Payer: Self-pay

## 2020-04-25 VITALS — BP 119/78 | HR 70 | Resp 16 | Wt 185.6 lb

## 2020-04-25 DIAGNOSIS — E1122 Type 2 diabetes mellitus with diabetic chronic kidney disease: Secondary | ICD-10-CM

## 2020-04-25 DIAGNOSIS — I63239 Cerebral infarction due to unspecified occlusion or stenosis of unspecified carotid arteries: Secondary | ICD-10-CM

## 2020-04-25 DIAGNOSIS — E1169 Type 2 diabetes mellitus with other specified complication: Secondary | ICD-10-CM

## 2020-04-25 DIAGNOSIS — N183 Chronic kidney disease, stage 3 unspecified: Secondary | ICD-10-CM | POA: Diagnosis not present

## 2020-04-25 DIAGNOSIS — E785 Hyperlipidemia, unspecified: Secondary | ICD-10-CM | POA: Diagnosis not present

## 2020-04-25 DIAGNOSIS — I2510 Atherosclerotic heart disease of native coronary artery without angina pectoris: Secondary | ICD-10-CM | POA: Diagnosis not present

## 2020-04-25 NOTE — Telephone Encounter (Signed)
Telephone call from [name] returning RN's call to schedule palliative care visit.  [name] in agreement with palliative care team making home visit 05/02/20 at 11:15 AM.

## 2020-04-25 NOTE — Progress Notes (Signed)
MRN : [id number]  Stephen [name] is a 86 y.o. (12-17-1926) male who presents with chief complaint of  Chief Complaint  Patient presents with  . Follow-up    ultrasound follow up  .  History of Present Illness:   Patient returns in follow of his carotid disease after his CT scan which I have independently reviewed.  He has a widely patent right carotid stent.  The left carotid system may have a trickle of flow in the ICA, but it is tiny and functionally occluded all the way up to the intracranial portion where it normalizes.  He is doing well with no new complaints.  Previous history of stroke, but no recent symptoms.   Today's ultrasound demonstrates the right ICA stent is widely patent.  Left ICA is occluded  Current Meds  Medication Sig  . acetaminophen (TYLENOL) 500 MG tablet Take 1 tablet (500 mg total) by mouth 2 (two) times daily. (Patient taking differently: Take 1,000 mg by mouth 2 (two) times daily. )  . ARIPiprazole (ABILIFY) 2 MG tablet Take 1 tablet by mouth daily.  Marland Kitchen aspirin EC 81 MG tablet Take 81 mg by mouth every morning.  . clonazePAM (KLONOPIN) 1 MG tablet TAKE 1/2 TO 1 TABLET BY MOUTH AT BEDTIME  . clopidogrel (PLAVIX) 75 MG tablet TAKE 1 TABLET (75 MG TOTAL) BY MOUTH AT BEDTIME.  . finasteride (PROSCAR) 5 MG tablet TAKE 1 TABLET BY MOUTH EVERY DAY  . memantine (NAMENDA) 10 MG tablet TAKE 1 TABLET BY MOUTH TWICE A DAY  . metFORMIN (GLUCOPHAGE) 500 MG tablet Take 1 tablet (500 mg total) by mouth daily with breakfast.  . metroNIDAZOLE (METROGEL) 1 % gel Apply 1 application topically daily.  . Multiple Vitamin (MULTIVITAMIN WITH MINERALS) TABS tablet Take 1 tablet by mouth daily.  . pantoprazole (PROTONIX) 40 MG tablet Take 1 tablet (40 mg total) by mouth 2 (two) times daily.  . pravastatin (PRAVACHOL) 40 MG tablet TAKE 1 TABLET BY MOUTH EVERY DAY  . vitamin B-12 (CYANOCOBALAMIN) 500 MCG tablet Take 500 mcg by mouth daily.    Past Medical History:  Diagnosis  Date  . Arthritis of knee   . BPH (benign prostatic hypertrophy)    w/ nocturia  . Coronary artery disease 03/2014   Inferior ST elevation myocardial infarction. Cardiac catheterization showed an occluded mid RCA. He had an angioplasty and drug-eluting stent placement with a 3.0 x 16 mm Promus drug-eluting stent. Ejection fraction was 45% by echo, completed cardiac rehab 06/2014  . CVA (cerebral vascular accident) ([place]) 03/2018  . Dementia ([place])    s/p stroke  . Diabetes type 2, controlled ([place])    Pt is taking Metformin  . Heart murmur    keeping an eye on it but not treating  . History of basal cell cancer    s/p mohs  . History of colon polyps   . History of hiatal hernia 05/2018   this causes laryngeal spasms. pt was taking nitro for this when bp drops and he ends up with tia symptoms  . History of hypertension   . HLD (hyperlipidemia)    diet controlled in past  . Hypertension   . Insomnia    treated with multiple meds in past  . Lung cancer ([place])   . MI (myocardial infarction) ([place]) 2015  . Rosacea   . Skin cancer   . Sleep apnea    has cpap but does not use properly and does not like it!!  .  Stroke ([place]) 03/2018   per cat scan, patient has had several strokes  . TIA (transient ischemic attack)    x 2 since CVA 03-2018  . UTI (urinary tract infection)     Past Surgical History:  Procedure Laterality Date  . CARDIAC CATHETERIZATION  03/2014   [facility];x1 stent  . CAROTID PTA/STENT INTERVENTION Right 05/2018   [facility]  . CAROTID PTA/STENT INTERVENTION Right 06/05/2018   Procedure: CAROTID PTA/STENT INTERVENTION;  Surgeon: Algernon Huxley, MD;  Location: [place] [facility];  Service: Cardiovascular;  Laterality: Right;  . CATARACT EXTRACTION W/PHACO Left 10/09/2016   Procedure: CATARACT EXTRACTION PHACO AND INTRAOCULAR LENS PLACEMENT (IOC);  Surgeon: Birder Robson, MD;  Location: [facility] ORS;  Service: Ophthalmology;  Laterality: Left;  [place] 1.13 AP% 18.3 CDE 13.45 Fluid pack lot  # [id number]  . COLONOSCOPY  08/2010   hyperplastic polyp, rec rpt 5 yrs  . ESOPHAGOGASTRODUODENOSCOPY  05/2015   dilated stricture, normal biopsies, HH, no definite infection [name] @ [facility])  . ESOPHAGOGASTRODUODENOSCOPY (EGD) WITH PROPOFOL N/A 09/18/2019   Procedure: ESOPHAGOGASTRODUODENOSCOPY (EGD) WITH PROPOFOL;  Surgeon: [name], MD;  Location: [facility] ENDOSCOPY;  Service: Gastroenterology;  Laterality: N/A;  . EYE SURGERY Left    cataract extraction  . MOHS SURGERY     basal cell chin/back  . REPLACEMENT TOTAL KNEE Left 08/2006  . VASECTOMY  1970    Social History Social History   Tobacco Use  . Smoking status: Former Smoker    Types: Cigarettes    Quit date: 11/12/1962    Years since quitting: 57.4  . Smokeless tobacco: Never Used  . Tobacco comment: Quit 1964  Vaping Use  . Vaping Use: Never used  Substance Use Topics  . Alcohol use: Yes    Alcohol/week: 1.0 standard drink    Types: 1 Cans of beer per week    Comment: rarely  . Drug use: No    Family History Family History  Problem Relation Age of Onset  . Cancer Father 22       prostate and colon  . Cancer Mother        ovarian or uterine  . CAD Brother 87       MI, smoker  . Parkinson's disease Brother   . COPD Brother   . CAD Brother        MI  . Stroke Sister     Allergies  Allergen Reactions  . Aricept [Donepezil Hcl] Diarrhea    severe  . Belsomra [Suvorexant] Other (See Comments)    Anxiety. Did not work  . Vancomycin Itching, Rash and Other (See Comments)    Burning pain   . Ambien [Zolpidem Tartrate] Other (See Comments)    Gi problems  . Atorvastatin     muscle aches  . Doxycycline Rash  . Penicillins Hives    Has patient had a PCN reaction causing immediate rash, facial/tongue/throat swelling, SOB or lightheadedness with hypotension: Yes Has patient had a PCN reaction causing severe rash involving mucus membranes or skin necrosis: Yes Has patient had a PCN reaction that required  hospitalization No Has patient had a PCN reaction occurring within the last 10 years: No If all of the above answers are "NO", then may proceed with Cephalosporin use.   . Tamsulosin Other (See Comments)    Leg weakness.      REVIEW OF SYSTEMS (Negative unless checked)  Constitutional: [] Weight loss  [] Fever  [] Chills Cardiac: [] Chest pain   [] Chest pressure   [] Palpitations   []   Shortness of breath when laying flat   [] Shortness of breath with exertion. Vascular:  [] Pain in legs with walking   [] Pain in legs at rest  [] History of DVT   [] Phlebitis   [] Swelling in legs   [] Varicose veins   [] Non-healing ulcers Pulmonary:   [] Uses home oxygen   [] Productive cough   [] Hemoptysis   [] Wheeze  [] COPD   [] Asthma Neurologic:  [] Dizziness   [] Seizures   [] History of stroke   [] History of TIA  [] Aphasia   [] Vissual changes   [] Weakness or numbness in arm   [] Weakness or numbness in leg Musculoskeletal:   [] Joint swelling   [] Joint pain   [] Low back pain Hematologic:  [] Easy bruising  [] Easy bleeding   [] Hypercoagulable state   [] Anemic Gastrointestinal:  [] Diarrhea   [] Vomiting  [] Gastroesophageal reflux/heartburn   [] Difficulty swallowing. Genitourinary:  [] Chronic kidney disease   [] Difficult urination  [] Frequent urination   [] Blood in urine Skin:  [] Rashes   [] Ulcers  Psychological:  [] History of anxiety   []  History of major depression.  Physical Examination  Vitals:   04/25/20 1140  BP: 119/78  Pulse: 70  Resp: 16  Weight: 185 lb 9.6 oz (84.2 kg)   Body mass index is 28.22 kg/m. Gen: WD/WN, NAD Head: [place]/AT, No temporalis wasting.  Ear/Nose/Throat: Hearing grossly intact, nares w/o erythema or drainage Eyes: PER, EOMI, sclera nonicteric.  Neck: Supple, no large masses.   Pulmonary:  Good air movement, no audible wheezing bilaterally, no use of accessory muscles.  Cardiac: RRR, no JVD Vascular: Vessel Right Left  Radial Palpable Palpable  Gastrointestinal: Non-distended. No  guarding/no peritoneal signs.  Musculoskeletal: M/S 5/5 throughout.  No deformity or atrophy.  Neurologic: CN 2-12 intact. Symmetrical.  Speech is fluent. Motor exam as listed above. Psychiatric: Judgment intact, Mood & affect appropriate for pt's clinical situation. Dermatologic: No rashes or ulcers noted.  No changes consistent with cellulitis.  CBC Lab Results  Component Value Date   WBC 8.9 01/22/2019   HGB 11.7 (L) 01/22/2019   HCT 36.1 (L) 01/22/2019   MCV 83.7 01/22/2019   PLT 309.0 01/22/2019    BMET    Component Value Date/Time   NA 137 01/22/2019 1239   NA 138 03/23/2014 1612   K 4.6 01/22/2019 1239   K 4.3 03/23/2014 1612   K 5.1 05/22/2013 0000   CL 102 01/22/2019 1239   CL 105 03/23/2014 1612   CO2 25 01/22/2019 1239   CO2 29 03/23/2014 1612   GLUCOSE 80 01/22/2019 1239   GLUCOSE 128 (H) 03/23/2014 1612   BUN 16 01/22/2019 1239   BUN 22 (H) 03/23/2014 1612   CREATININE 1.20 05/06/2019 1136   CREATININE 1.30 03/23/2014 1612   CREATININE 1.14 05/22/2013 0000   CALCIUM 9.5 01/22/2019 1239   CALCIUM 9.2 03/23/2014 1612   GFRNONAA 57 (L) 11/24/2018 0341   GFRNONAA 50 (L) 03/23/2014 1612   GFRAA >60 11/24/2018 0341   GFRAA 58 (L) 03/23/2014 1612   CrCl cannot be calculated (Patient's most recent lab result is older than the maximum 21 days allowed.).  COAG Lab Results  Component Value Date   INR 1.07 06/04/2018   INR 1.06 04/26/2018   INR 1.05 03/15/2018    Radiology No results found.   Assessment/Plan 1. Carotid stenosis, symptomatic, with infarction [facility]) Recommend:  Given the patient's asymptomatic subcritical stenosis no further invasive testing or surgery at this time.  Today's ultrasound demonstrates the right ICA stent is widely patent.  Left  ICA is occluded  Continue antiplatelet therapy as prescribed Continue management of CAD, HTN and Hyperlipidemia Healthy heart diet,  encouraged exercise at least 4 times per week Follow up in 12  months with duplex ultrasound and physical exam   - VAS US CAROTID; Future  2. Coronary artery disease involving native coronary artery of native heart without angina pectoris Continue cardiac and antihypertensive medications as already ordered and reviewed, no changes at this time.  Continue statin as ordered and reviewed, no changes at this time  Nitrates PRN for chest pain   3. Controlled type 2 diabetes mellitus with stage 3 chronic kidney disease, without long-term current use of insulin (HCC) Continue hypoglycemic medications as already ordered, these medications have been reviewed and there are no changes at this time.  Hgb A1C to be monitored as already arranged by primary service   4. Hyperlipidemia associated with type 2 diabetes mellitus ([place]) Continue statin as ordered and reviewed, no changes at this time     Hortencia Pilar, MD  04/25/2020 12:19 PM

## 2020-04-25 NOTE — Telephone Encounter (Signed)
RN called patient and [name]'s home to schedule palliative care visit.  RN left message requesting call back to schedule visit.

## 2020-05-02 ENCOUNTER — Other Ambulatory Visit: Payer: Medicare Other

## 2020-05-02 ENCOUNTER — Other Ambulatory Visit: Payer: Self-pay

## 2020-05-02 NOTE — Progress Notes (Signed)
PATIENT NAME: Stephen [name] DOB: [birth year] MRN: [id number]  PRIMARY CARE PROVIDER: Ria Bush, MD  RESPONSIBLE PARTY:  Acct ID - Guarantor Home Phone Work Phone Relationship Acct Type  [id number] CASHTON, HOSLEY* [phone]  Self P/F     [address], [place], [place] [address]    PLAN OF CARE and INTERVENTIONS:               1.  GOALS OF CARE/ ADVANCE CARE PLANNING:  Remain in home with significant other Stephen [name].                2.  PATIENT/CAREGIVER EDUCATION:  Education on fall precautions, education on s/s of infection, reviewed meds, support               4. PERSONAL EMERGENCY PLAN:  Patient resides at [facility].  Patient is followed by palliative care and is seen monthly and PRN               5.  DISEASE STATUS: [place] and RN made scheduled palliative care home visit.  Palliative care team met with patient and patients significant other Stephen [name].    HISTORY OF PRESENT ILLNESS: SW [place] and RN made scheduled palliative care home visit. Palliative care team met with patient and his significant other Stephen [name].  Patient dressed sitting in chair in living room. Patient alert to self and Stephen [name]. Patient pleasant and cooperative with assessment. Stephen [name] reports patient is going back to the [employer] (adult daycare) tomorrow.  Stephen [name] reports patient will be going on Tuesdays and Thursdays from approximately 9:30 until 2:30. Patients memory is getting worse per Stephen [name]. Patient could not remember daughter and son in law visiting until until he had some cues from Stephen [name].  Patient is scheduled to have cataract surgery 05/17/2020. Patient ambulates with [name]'s assist and cane.   Patient had MD appointment with vascular MD.  Stephen [name] reports MD informed them that patients left carotid artery is completely occluded. Dr Nino Parsley note reads "patient has a right carotid stent the left carotid system may have a trickle of flow in the ICA but it is tiny and functionally occluded all the  way up to the inner cranial portion where it normalizes." MD felt patient was doing well with no complaints.  Patients appetite remains good. Stephen [name] reports Dr. Danise Mina is going to order an X-ray or scan to look at lung mass to determine if it is enlarging.  Patient made the decision not to have any work up when lung mass was discovered. Patient has expiratory wheezes in right upper lung. Patient vital signs stable. Patient and Stephen [name] both report patient has been sleeping well and patient takes a nap during the day. Patient has not had any changes in current medications.  Nurse reviewed patient's medications with Stephen [name]. Patient and Stephen [name] remain in agreement with palliative care services. Stephen [name] encouraged to contact palliative care with questions or concerns.     CODE STATUS: DNR   ADVANCED DIRECTIVES: Y MOST FORM: Yes PPS: 40%   PHYSICAL EXAM:   VITALS: Today's Vitals   05/02/20 1202  BP: 116/68  Pulse: 60  Resp: 16  Temp: 98.1 F (36.7 C)  TempSrc: Temporal  SpO2: 98%  PainSc: 0-No pain    LUNGS: expitatory wheezing in right upper lung.     CARDIAC: Cor RRR  EXTREMITIES: Trace edema SKIN: Skin color, texture, turgor normal. No rashes or lesions  NEURO: positive for gait problems and memory problems  Nilda Simmer, RN

## 2020-05-10 ENCOUNTER — Encounter (INDEPENDENT_AMBULATORY_CARE_PROVIDER_SITE_OTHER): Payer: Medicare Other

## 2020-05-11 ENCOUNTER — Encounter: Payer: Self-pay | Admitting: Ophthalmology

## 2020-05-11 ENCOUNTER — Other Ambulatory Visit: Payer: Self-pay

## 2020-05-12 NOTE — Discharge Instructions (Signed)

## 2020-05-16 ENCOUNTER — Other Ambulatory Visit: Payer: Self-pay | Admitting: Family Medicine

## 2020-05-17 ENCOUNTER — Encounter: Payer: Self-pay | Admitting: Ophthalmology

## 2020-05-17 ENCOUNTER — Other Ambulatory Visit: Payer: Self-pay

## 2020-05-17 ENCOUNTER — Ambulatory Visit: Payer: Medicare Other | Admitting: Anesthesiology

## 2020-05-17 ENCOUNTER — Encounter
Admission: RE | Disposition: A | Discharge status: Home / Self Care | Payer: Self-pay | Source: Home / Self Care | Attending: Ophthalmology

## 2020-05-17 ENCOUNTER — Ambulatory Visit
Admission: RE | Admit: 2020-05-17 | Discharge: 2020-05-17 | Disposition: A | Discharge status: Home / Self Care | Payer: Medicare Other | Attending: Ophthalmology | Admitting: Ophthalmology

## 2020-05-17 DIAGNOSIS — E1122 Type 2 diabetes mellitus with diabetic chronic kidney disease: Secondary | ICD-10-CM | POA: Diagnosis not present

## 2020-05-17 DIAGNOSIS — Z7902 Long term (current) use of antithrombotics/antiplatelets: Secondary | ICD-10-CM | POA: Insufficient documentation

## 2020-05-17 DIAGNOSIS — Z955 Presence of coronary angioplasty implant and graft: Secondary | ICD-10-CM | POA: Diagnosis not present

## 2020-05-17 DIAGNOSIS — Z85828 Personal history of other malignant neoplasm of skin: Secondary | ICD-10-CM | POA: Diagnosis not present

## 2020-05-17 DIAGNOSIS — I252 Old myocardial infarction: Secondary | ICD-10-CM | POA: Insufficient documentation

## 2020-05-17 DIAGNOSIS — D649 Anemia, unspecified: Secondary | ICD-10-CM | POA: Insufficient documentation

## 2020-05-17 DIAGNOSIS — M1990 Unspecified osteoarthritis, unspecified site: Secondary | ICD-10-CM | POA: Diagnosis not present

## 2020-05-17 DIAGNOSIS — Z79899 Other long term (current) drug therapy: Secondary | ICD-10-CM | POA: Insufficient documentation

## 2020-05-17 DIAGNOSIS — Z66 Do not resuscitate: Secondary | ICD-10-CM | POA: Insufficient documentation

## 2020-05-17 DIAGNOSIS — I509 Heart failure, unspecified: Secondary | ICD-10-CM | POA: Insufficient documentation

## 2020-05-17 DIAGNOSIS — I2510 Atherosclerotic heart disease of native coronary artery without angina pectoris: Secondary | ICD-10-CM | POA: Diagnosis not present

## 2020-05-17 DIAGNOSIS — H2511 Age-related nuclear cataract, right eye: Secondary | ICD-10-CM | POA: Diagnosis not present

## 2020-05-17 DIAGNOSIS — Z7982 Long term (current) use of aspirin: Secondary | ICD-10-CM | POA: Diagnosis not present

## 2020-05-17 DIAGNOSIS — F0280 Dementia in other diseases classified elsewhere without behavioral disturbance: Secondary | ICD-10-CM | POA: Insufficient documentation

## 2020-05-17 DIAGNOSIS — Z87891 Personal history of nicotine dependence: Secondary | ICD-10-CM | POA: Diagnosis not present

## 2020-05-17 DIAGNOSIS — G308 Other Alzheimer's disease: Secondary | ICD-10-CM | POA: Insufficient documentation

## 2020-05-17 DIAGNOSIS — Z85118 Personal history of other malignant neoplasm of bronchus and lung: Secondary | ICD-10-CM | POA: Insufficient documentation

## 2020-05-17 DIAGNOSIS — K219 Gastro-esophageal reflux disease without esophagitis: Secondary | ICD-10-CM | POA: Insufficient documentation

## 2020-05-17 DIAGNOSIS — Z7984 Long term (current) use of oral hypoglycemic drugs: Secondary | ICD-10-CM | POA: Insufficient documentation

## 2020-05-17 DIAGNOSIS — N400 Enlarged prostate without lower urinary tract symptoms: Secondary | ICD-10-CM | POA: Diagnosis not present

## 2020-05-17 DIAGNOSIS — G4730 Sleep apnea, unspecified: Secondary | ICD-10-CM | POA: Diagnosis not present

## 2020-05-17 DIAGNOSIS — N183 Chronic kidney disease, stage 3 unspecified: Secondary | ICD-10-CM | POA: Diagnosis not present

## 2020-05-17 DIAGNOSIS — E785 Hyperlipidemia, unspecified: Secondary | ICD-10-CM | POA: Diagnosis not present

## 2020-05-17 DIAGNOSIS — I129 Hypertensive chronic kidney disease with stage 1 through stage 4 chronic kidney disease, or unspecified chronic kidney disease: Secondary | ICD-10-CM | POA: Insufficient documentation

## 2020-05-17 DIAGNOSIS — Z8673 Personal history of transient ischemic attack (TIA), and cerebral infarction without residual deficits: Secondary | ICD-10-CM | POA: Diagnosis not present

## 2020-05-17 HISTORY — DX: Presence of external hearing-aid: Z97.4

## 2020-05-17 HISTORY — PX: CATARACT EXTRACTION W/PHACO: SHX586

## 2020-05-17 HISTORY — DX: Presence of dental prosthetic device (complete) (partial): Z97.2

## 2020-05-17 HISTORY — DX: Gastro-esophageal reflux disease without esophagitis: K21.9

## 2020-05-17 LAB — GLUCOSE, CAPILLARY
Glucose-Capillary: 105 mg/dL — ABNORMAL HIGH (ref 70–99)
Glucose-Capillary: 105 mg/dL — ABNORMAL HIGH (ref 70–99)

## 2020-05-17 SURGERY — PHACOEMULSIFICATION, CATARACT, WITH IOL INSERTION
Anesthesia: Monitor Anesthesia Care | Site: Eye | Laterality: Right

## 2020-05-17 MED ORDER — ARMC OPHTHALMIC DILATING DROPS
1.0000 "application " | OPHTHALMIC | Status: DC | PRN
  Administered 2020-05-17 (×3): 1 via OPHTHALMIC

## 2020-05-17 MED ORDER — ACETAMINOPHEN 10 MG/ML IV SOLN: 1000.0000 mg | Freq: Once | INTRAVENOUS | Status: DC | PRN

## 2020-05-17 MED ORDER — LACTATED RINGERS IV SOLN: INTRAVENOUS | Status: DC

## 2020-05-17 MED ORDER — NA CHONDROIT SULF-NA HYALURON 40-17 MG/ML IO SOLN
INTRAOCULAR | Status: DC | PRN
  Administered 2020-05-17: 1 mL via INTRAOCULAR

## 2020-05-17 MED ORDER — BRIMONIDINE TARTRATE-TIMOLOL 0.2-0.5 % OP SOLN
OPHTHALMIC | Status: DC | PRN
  Administered 2020-05-17: 1 [drp] via OPHTHALMIC

## 2020-05-17 MED ORDER — MOXIFLOXACIN HCL 0.5 % OP SOLN
OPHTHALMIC | Status: DC | PRN
  Administered 2020-05-17: 0.2 mL via OPHTHALMIC

## 2020-05-17 MED ORDER — FENTANYL CITRATE (PF) 100 MCG/2ML IJ SOLN
INTRAMUSCULAR | Status: DC | PRN
  Administered 2020-05-17: 50 ug via INTRAVENOUS

## 2020-05-17 MED ORDER — LIDOCAINE HCL (PF) 2 % IJ SOLN
INTRAOCULAR | Status: DC | PRN
  Administered 2020-05-17: 1 mL

## 2020-05-17 MED ORDER — EPINEPHRINE PF 1 MG/ML IJ SOLN
INTRAOCULAR | Status: DC | PRN
  Administered 2020-05-17: 137 mL via OPHTHALMIC

## 2020-05-17 MED ORDER — TETRACAINE HCL 0.5 % OP SOLN
1.0000 [drp] | OPHTHALMIC | Status: DC | PRN
  Administered 2020-05-17 (×3): 1 [drp] via OPHTHALMIC

## 2020-05-17 MED ORDER — ONDANSETRON HCL 4 MG/2ML IJ SOLN: 4.0000 mg | Freq: Once | INTRAMUSCULAR | Status: DC | PRN

## 2020-05-17 SURGICAL SUPPLY — 20 items
CANNULA ANT/CHMB 27G (MISCELLANEOUS) ×2 IMPLANT
CANNULA ANT/CHMB 27GA (MISCELLANEOUS) ×4 IMPLANT
GLOVE SURG LX 8.0 MICRO (GLOVE) ×1
GLOVE SURG LX STRL 8.0 MICRO (GLOVE) ×1 IMPLANT
GLOVE SURG TRIUMPH 8.0 PF LTX (GLOVE) ×2 IMPLANT
GOWN STRL REUS W/ TWL LRG LVL3 (GOWN DISPOSABLE) ×2 IMPLANT
GOWN STRL REUS W/TWL LRG LVL3 (GOWN DISPOSABLE) ×2
LENS IOL DIOP 19.0 (Intraocular Lens) ×2 IMPLANT
LENS IOL TECNIS MONO 19.0 (Intraocular Lens) IMPLANT
MARKER SKIN DUAL TIP RULER LAB (MISCELLANEOUS) ×2 IMPLANT
NDL FILTER BLUNT 18X1 1/2 (NEEDLE) ×1 IMPLANT
NEEDLE FILTER BLUNT 18X 1/2SAF (NEEDLE) ×1
NEEDLE FILTER BLUNT 18X1 1/2 (NEEDLE) ×1 IMPLANT
PACK EYE AFTER SURG (MISCELLANEOUS) ×2 IMPLANT
PACK OPTHALMIC (MISCELLANEOUS) ×2 IMPLANT
PACK PORFILIO (MISCELLANEOUS) ×2 IMPLANT
SYR 3ML LL SCALE MARK (SYRINGE) ×2 IMPLANT
SYR TB 1ML LUER SLIP (SYRINGE) ×2 IMPLANT
WATER STERILE IRR 250ML POUR (IV SOLUTION) ×2 IMPLANT
WIPE NON LINTING 3.25X3.25 (MISCELLANEOUS) ×2 IMPLANT

## 2020-05-17 NOTE — Anesthesia Preprocedure Evaluation (Signed)
Anesthesia Evaluation  Patient identified by MRN, date of birth, ID band Patient awake    Reviewed: Allergy & Precautions, NPO status , Patient's Chart, lab work & pertinent test results, reviewed documented beta blocker date and time   History of Anesthesia Complications Negative for: history of anesthetic complications  Airway Mallampati: III  TM Distance: >3 FB Neck ROM: Limited    Dental  (+) Partial Lower   Pulmonary sleep apnea , Patient abstained from smoking., former smoker,   Interstitial pulmonary fibrosis   breath sounds clear to auscultation       Cardiovascular Exercise Tolerance: Poor hypertension, (-) angina+ CAD, + Past MI (2015), + Cardiac Stents (2015 DES to RCA), + Peripheral Vascular Disease (S/p carotid stent) and +CHF (LVEF 45% after MI in 2015, last LVEF 55-60% in 2019)  (-) DOE  Rhythm:Regular Rate:Normal   HLD   Neuro/Psych PSYCHIATRIC DISORDERS Dementia  Periodic limb movement disorder TIACVA (03/2018)    GI/Hepatic hiatal hernia, GERD  Controlled,  Endo/Other  diabetes, Type 2  Renal/GU CRFRenal disease     Musculoskeletal  (+) Arthritis ,   Abdominal   Peds  Hematology  (+) anemia ,   Anesthesia Other Findings Lung cancer Skin cancer  Reproductive/Obstetrics                            Anesthesia Physical Anesthesia Plan  ASA: III  Anesthesia Plan: MAC   Post-op Pain Management:    Induction: Intravenous  PONV Risk Score and Plan: 1 and TIVA, Midazolam and Treatment may vary due to age or medical condition  Airway Management Planned: Nasal Cannula  Additional Equipment:   Intra-op Plan:   Post-operative Plan:   Informed Consent: I have reviewed the patients History and Physical, chart, labs and discussed the procedure including the risks, benefits and alternatives for the proposed anesthesia with the patient or authorized representative who has  indicated his/her understanding and acceptance.       Plan Discussed with: CRNA and Anesthesiologist  Anesthesia Plan Comments:         Anesthesia Quick Evaluation

## 2020-05-17 NOTE — Transfer of Care (Signed)
Immediate Anesthesia Transfer of Care Note  Patient: Stephen [name]  Procedure(s) Performed: CATARACT EXTRACTION PHACO AND INTRAOCULAR LENS PLACEMENT (IOC) RIGHT DIABETIC 14.51  01:37.4   (Right Eye)  Patient Location: PACU  Anesthesia Type: MAC  Level of Consciousness: awake, alert  and patient cooperative  Airway and Oxygen Therapy: Patient Spontanous Breathing   Post-op Assessment: Post-op Vital signs reviewed, Patient's Cardiovascular Status Stable, Respiratory Function Stable, Patent Airway and No signs of Nausea or vomiting  Post-op Vital Signs: Reviewed and stable  Complications: No complications documented.

## 2020-05-17 NOTE — Anesthesia Procedure Notes (Signed)
Procedure Name: MAC Date/Time: 05/17/2020 9:19 AM Performed by: Vanetta Shawl, CRNA Pre-anesthesia Checklist: Patient identified, Emergency Drugs available, Suction available, Timeout performed and Patient being monitored Patient Re-evaluated:Patient Re-evaluated prior to induction Oxygen Delivery Method: Nasal cannula Placement Confirmation: positive ETCO2

## 2020-05-17 NOTE — Op Note (Signed)
PREOPERATIVE DIAGNOSIS:  Nuclear sclerotic cataract of the right eye.   POSTOPERATIVE DIAGNOSIS:  H25.11 Cataract   OPERATIVE PROCEDURE:@   SURGEON:  Birder Robson, MD.   ANESTHESIA:  Anesthesiologist: Heniser, Fredric Dine, MD CRNA: Vanetta Shawl, CRNA  1.      Managed anesthesia care. 2.      0.77ml of Shugarcaine was instilled in the eye following the paracentesis.   COMPLICATIONS:  None.   TECHNIQUE:   Stop and chop   DESCRIPTION OF PROCEDURE:  The patient was examined and consented in the preoperative holding area where the aforementioned topical anesthesia was applied to the right eye and then brought back to the Operating Room where the right eye was prepped and draped in the usual sterile ophthalmic fashion and a lid speculum was placed. A paracentesis was created with the side port blade and the anterior chamber was filled with viscoelastic. A near clear corneal incision was performed with the steel keratome. A continuous curvilinear capsulorrhexis was performed with a cystotome followed by the capsulorrhexis forceps. Hydrodissection and hydrodelineation were carried out with BSS on a blunt cannula. The lens was removed in a stop and chop  technique and the remaining cortical material was removed with the irrigation-aspiration handpiece. The capsular bag was inflated with viscoelastic and the Technis [id number]  lens was placed in the capsular bag without complication. The remaining viscoelastic was removed from the eye with the irrigation-aspiration handpiece. The wounds were hydrated. The anterior chamber was flushed with BSS and the eye was inflated to physiologic pressure. 0.23ml of Vigamox was placed in the anterior chamber. The wounds were found to be water tight. The eye was dressed with Combigan. The patient was given protective glasses to wear throughout the day and a shield with which to sleep tonight. The patient was also given drops with which to begin a drop regimen today and will  follow-up with me in one day. Implant Name Type Inv. Item Serial No. Manufacturer Lot No. LRB No. Used Action  LENS IOL DIOP 19.0 - [id number] Intraocular Lens LENS IOL DIOP 19.0 [id number] [employer]orden  Right 1 Implanted   Procedure(s) with comments: CATARACT EXTRACTION PHACO AND INTRAOCULAR LENS PLACEMENT (IOC) RIGHT DIABETIC 14.51  01:37.4   (Right) - Diabetic - oral meds Requests later AM arrival  Electronically signed: Birder Robson 05/17/2020 9:43 AM

## 2020-05-17 NOTE — Anesthesia Postprocedure Evaluation (Signed)
Anesthesia Post Note  Patient: Stephen [name]  Procedure(s) Performed: CATARACT EXTRACTION PHACO AND INTRAOCULAR LENS PLACEMENT (IOC) RIGHT DIABETIC 14.51  01:37.4   (Right Eye)     Patient location during evaluation: PACU Anesthesia Type: MAC Level of consciousness: awake and alert Pain management: pain level controlled Vital Signs Assessment: post-procedure vital signs reviewed and stable Respiratory status: spontaneous breathing, nonlabored ventilation, respiratory function stable and patient connected to nasal cannula oxygen Cardiovascular status: stable and blood pressure returned to baseline Postop Assessment: no apparent nausea or vomiting Anesthetic complications: no   No complications documented.  Jazzlyn Huizenga A  Amylynn Fano

## 2020-05-17 NOTE — H&P (Signed)
All labs reviewed. Abnormal studies sent to patients PCP when indicated.  Previous H&P reviewed, patient examined, there are NO CHANGES.  Stephen Gosse Porfilio7/6/20219:09 AM

## 2020-05-17 NOTE — Telephone Encounter (Signed)
Name of Medication: Clonazepam Name of Pharmacy: [facility] Last Venida Jarvis or Written Date and Quantity: 04/14/20, #30 Last Office Visit and Type: 04/22/20, discuss daycare at [place] Visit and Type: 6 mo f/u Last Controlled Substance Agreement Date: none Last UDS: none

## 2020-05-18 ENCOUNTER — Telehealth: Payer: Self-pay | Admitting: Family Medicine

## 2020-05-18 ENCOUNTER — Encounter: Payer: Self-pay | Admitting: Ophthalmology

## 2020-05-18 NOTE — Telephone Encounter (Signed)
Pt needs a refill on Clonazepam. Pt only has 1 pill and they don't want to be in the ER because he don't have his medication. They have contacted the pharmacy several times regarding this prescription.

## 2020-05-19 NOTE — Telephone Encounter (Signed)
Spoke with pt's significant other, [name] (on dpr), notifying her refill was sent in today.  She expresses her thanks.

## 2020-05-19 NOTE — Telephone Encounter (Signed)
ERx 

## 2020-05-30 ENCOUNTER — Telehealth: Payer: Self-pay

## 2020-05-30 NOTE — Telephone Encounter (Signed)
Telephone call to patients significant other [name] to schedule palliative care visit.  [name] in agreement with RN making home visit 06/10/20 at 10:00 AM.

## 2020-06-08 ENCOUNTER — Other Ambulatory Visit: Payer: Self-pay

## 2020-06-08 ENCOUNTER — Ambulatory Visit (INDEPENDENT_AMBULATORY_CARE_PROVIDER_SITE_OTHER): Payer: Medicare Other | Admitting: Family Medicine

## 2020-06-08 ENCOUNTER — Encounter: Payer: Self-pay | Admitting: Family Medicine

## 2020-06-08 VITALS — BP 110/70 | HR 97 | Temp 96.9°F | Ht 68.0 in | Wt 187.2 lb

## 2020-06-08 DIAGNOSIS — I63239 Cerebral infarction due to unspecified occlusion or stenosis of unspecified carotid arteries: Secondary | ICD-10-CM | POA: Diagnosis not present

## 2020-06-08 DIAGNOSIS — F0150 Vascular dementia without behavioral disturbance: Secondary | ICD-10-CM

## 2020-06-08 DIAGNOSIS — F0280 Dementia in other diseases classified elsewhere without behavioral disturbance: Secondary | ICD-10-CM

## 2020-06-08 DIAGNOSIS — R933 Abnormal findings on diagnostic imaging of other parts of digestive tract: Secondary | ICD-10-CM | POA: Diagnosis not present

## 2020-06-08 DIAGNOSIS — N183 Chronic kidney disease, stage 3 unspecified: Secondary | ICD-10-CM

## 2020-06-08 DIAGNOSIS — E1122 Type 2 diabetes mellitus with diabetic chronic kidney disease: Secondary | ICD-10-CM | POA: Diagnosis not present

## 2020-06-08 DIAGNOSIS — G309 Alzheimer's disease, unspecified: Secondary | ICD-10-CM

## 2020-06-08 DIAGNOSIS — Z8673 Personal history of transient ischemic attack (TIA), and cerebral infarction without residual deficits: Secondary | ICD-10-CM | POA: Diagnosis not present

## 2020-06-08 DIAGNOSIS — R911 Solitary pulmonary nodule: Secondary | ICD-10-CM

## 2020-06-08 LAB — POCT GLYCOSYLATED HEMOGLOBIN (HGB A1C): Hemoglobin A1C: 6.5 % — AB (ref 4.0–5.6)

## 2020-06-08 NOTE — Patient Instructions (Addendum)
Return for fasting labs and wellness visit in 3 months.  You are doing well today!

## 2020-06-08 NOTE — Assessment & Plan Note (Addendum)
Chronic, well controlled.  DSME deferred.  They request podiatry referral for foot care.

## 2020-06-08 NOTE — Progress Notes (Signed)
This visit was conducted in person.  BP 110/70   Pulse 97   Temp (!) 96.9 F (36.1 C) (Temporal)   Ht 5\' 8"  (1.727 m)   Wt 187 lb 3 oz (84.9 kg)   SpO2 98%   BMI 28.46 kg/m    CC: 6 mo DM f/u visit  Subjective:    Patient ID: Stephen [name], male    DOB: [birth year], 86 y.o.   MRN: [id number]  HPI: Stephen [name] is a 86 y.o. male presenting on 06/08/2020 for Follow-up (6 month- DM ) and inquiry about orders (for nodule )   Known mixed vascular and alz dementia on aspirin, plavix, pravastatin, namenda and abilify 2mg  daily. Did not tolerate aricept. Started memory day care at [facility] 2d/wk. They are very happy with this.   Saw [facility] last month - note reviewed. Known LICA occlusion s/p stroke, RICA stent widely aptent.   Saw Dr Miachel Roux oncology 09/2019 - for concerning suspicious LUL nodule and increased metabolic activity of distal esophageal wall. EGD was reassuring only showing large hiatal hernia. Case reviewed at tumor board - rec palliative care only, with option to see rad onc for radiation to the lung nodule in future if desired. Established with [facility] palliative care 09/2019. Seeing monthly.   DM - does not regularly check sugars. Compliant with antihyperglycemic regimen which includes: metformin 500mg  daily. Denies low sugars or hypoglycemic symptoms. Denies paresthesias. Last diabetic eye exam 07/2019. Cataracts removed earlier this month. Pneumovax: 2019. Prevnar: 2016. Glucometer brand: doesn't have at home. DSME: declines - given how well he's doing.  Lab Results  Component Value Date   HGBA1C 6.5 (A) 06/08/2020   Diabetic Foot Exam - Simple   Simple Foot Form Diabetic Foot exam was performed with the following findings: Yes 06/08/2020 10:36 AM  Visual Inspection No deformities, no ulcerations, no other skin breakdown bilaterally: Yes Sensation Testing Intact to touch and monofilament testing bilaterally: Yes Pulse Check Posterior Tibialis and  Dorsalis pulse intact bilaterally: Yes Comments Slightly diminished pulses. Pedal edema at medial ankles    Lab Results  Component Value Date   MICROALBUR 1.4 12/20/2017         Relevant past medical, surgical, family and social history reviewed and updated as indicated. Interim medical history since our last visit reviewed. Allergies and medications reviewed and updated. Outpatient Medications Prior to Visit  Medication Sig Dispense Refill  . acetaminophen (TYLENOL) 500 MG tablet Take 1 tablet (500 mg total) by mouth 2 (two) times daily. (Patient taking differently: Take 1,000 mg by mouth 2 (two) times daily. ) 30 tablet 0  . ARIPiprazole (ABILIFY) 2 MG tablet Take 1 tablet by mouth daily.    Marland Kitchen aspirin EC 81 MG tablet Take 81 mg by mouth every morning.    . clonazePAM (KLONOPIN) 1 MG tablet TAKE 1/2-1 TABLET BY MOUTH AT BEDTIME 30 tablet 1  . clopidogrel (PLAVIX) 75 MG tablet TAKE 1 TABLET (75 MG TOTAL) BY MOUTH AT BEDTIME. 90 tablet 1  . finasteride (PROSCAR) 5 MG tablet TAKE 1 TABLET BY MOUTH EVERY DAY 90 tablet 0  . memantine (NAMENDA) 10 MG tablet TAKE 1 TABLET BY MOUTH TWICE A DAY 180 tablet 2  . metFORMIN (GLUCOPHAGE) 500 MG tablet Take 1 tablet (500 mg total) by mouth daily with breakfast. 90 tablet 3  . metroNIDAZOLE (METROGEL) 1 % gel Apply 1 application topically daily.    . Multiple Vitamin (MULTIVITAMIN WITH MINERALS) TABS tablet  Take 1 tablet by mouth daily.    . pantoprazole (PROTONIX) 40 MG tablet Take 1 tablet (40 mg total) by mouth 2 (two) times daily. 180 tablet 1  . pravastatin (PRAVACHOL) 40 MG tablet TAKE 1 TABLET BY MOUTH EVERY DAY 90 tablet 1  . vitamin B-12 (CYANOCOBALAMIN) 500 MCG tablet Take 500 mcg by mouth daily.     No facility-administered medications prior to visit.     Per HPI unless specifically indicated in ROS section below Review of Systems Objective:  BP 110/70   Pulse 97   Temp (!) 96.9 F (36.1 C) (Temporal)   Ht 5\' 8"  (1.727 m)   Wt  187 lb 3 oz (84.9 kg)   SpO2 98%   BMI 28.46 kg/m   Wt Readings from Last 3 Encounters:  06/10/20 187 lb (84.8 kg)  06/08/20 187 lb 3 oz (84.9 kg)  05/17/20 185 lb (83.9 kg)      Physical Exam Vitals and nursing note reviewed.  Constitutional:      General: He is not in acute distress.    Appearance: Normal appearance. He is well-developed. He is not ill-appearing.  HENT:     Head: Normocephalic and atraumatic.     Right Ear: External ear normal.     Left Ear: External ear normal.  Eyes:     General: No scleral icterus.    Extraocular Movements: Extraocular movements intact.     Conjunctiva/sclera: Conjunctivae normal.     Pupils: Pupils are equal, round, and reactive to light.  Cardiovascular:     Rate and Rhythm: Normal rate and regular rhythm.     Pulses: Normal pulses.     Heart sounds: Normal heart sounds.  Pulmonary:     Effort: Pulmonary effort is normal. No respiratory distress.     Breath sounds: Normal breath sounds. No wheezing, rhonchi or rales.  Musculoskeletal:     Cervical back: Normal range of motion and neck supple.     Right lower leg: Edema (mild) present.     Left lower leg: Edema (mild) present.     Comments: See HPI for foot exam if done  Lymphadenopathy:     Cervical: No cervical adenopathy.  Skin:    General: Skin is warm and dry.     Findings: No rash.  Neurological:     Mental Status: He is alert.  Psychiatric:        Mood and Affect: Mood normal.        Behavior: Behavior normal.       Results for orders placed or performed in visit on 06/08/20  POCT glycosylated hemoglobin (Hb A1C)  Result Value Ref Range   Hemoglobin A1C 6.5 (A) 4.0 - 5.6 %   HbA1c POC (<> result, manual entry)     HbA1c, POC (prediabetic range)     HbA1c, POC (controlled diabetic range)     Assessment & Plan:  This visit occurred during the SARS-CoV-2 public health emergency.  Safety protocols were in place, including screening questions prior to the visit,  additional usage of staff PPE, and extensive cleaning of exam room while observing appropriate contact time as indicated for disinfecting solutions.   Problem List Items Addressed This Visit    Pulmonary nodule - Primary    S/p onc eval and tumor board review 08/2019 - rec palliative care with option to consider radiation treatment to nodule, family in agreement. He is overall largely asymptomatic from respiratory standpoint. Discussed option of repeat imaging to  help decide on rad onc eval - will reassess at [facility] later this year.       Mixed Alzheimer's and vascular dementia ([name])    Stable period on namenda and abilify, has seen neurology. Doing well attending [facility] [place] 2d/wk.       History of ischemic left MCA stroke    Continues aspirin, plavix, statin.       Controlled type 2 diabetes mellitus with stage 3 chronic kidney disease, without long-term current use of insulin (HCC)    Chronic, well controlled.  DSME deferred.  They request podiatry referral for foot care.       Relevant Orders   POCT glycosylated hemoglobin (Hb A1C) (Completed)   Ambulatory referral to Podiatry   CKD (chronic kidney disease) stage 3, GFR 30-59 ml/min    Offered labs today - we will defer until wellness visit later this year.       Carotid stenosis, symptomatic, with infarction ([place])    Known LICA occlusion s/p stroke, RICA with stent. Closely followed by [facility]      Abnormal barium swallow    Due to large sliding HH s/p reassuring EGD 2020          No orders of the defined types were placed in this encounter.  Orders Placed This Encounter  Procedures  . Ambulatory referral to Podiatry    Referral Priority:   Routine    Referral Type:   Consultation    Referral Reason:   Specialty Services Required    Requested Specialty:   Podiatry    Number of Visits Requested:   1  . POCT glycosylated hemoglobin (Hb A1C)    Patient Instructions  Return for fasting labs and wellness  visit in 3 months.  You are doing well today!   Follow up plan: Return in about 3 months (around 09/08/2020) for medicare wellness visit.  Ria Bush, MD

## 2020-06-10 ENCOUNTER — Other Ambulatory Visit: Payer: Medicare Other

## 2020-06-10 ENCOUNTER — Other Ambulatory Visit: Payer: Self-pay

## 2020-06-10 VITALS — BP 102/78 | HR 80 | Temp 97.7°F | Resp 16 | Wt 187.0 lb

## 2020-06-10 DIAGNOSIS — Z515 Encounter for palliative care: Secondary | ICD-10-CM

## 2020-06-10 NOTE — Progress Notes (Signed)
PATIENT NAME: Stephen [name] DOB: December 06, 1926 MRN: [id number]  PRIMARY CARE PROVIDER: Ria Bush, MD  RESPONSIBLE PARTY:  Acct ID - Guarantor Home Phone Work Phone Relationship Acct Type  [id number] [name], Stephen* [phone]  Self P/F     [address], [place], [place] [address]    PLAN OF CARE and INTERVENTIONS:               1.  GOALS OF CARE/ ADVANCE CARE PLANNING: Remain in home with significant other Stephen [name] and be comfortable.               2.  PATIENT/CAREGIVER EDUCATION:  Education on fall precautions, education on s/s of infection, reviewed meds, support               3.  DISEASE STATUS: RN made scheduled palliative care home visit. Nurse met with patient and his significant other Stephen [name]. Stephen [name] reports patient had MD appointment on Wednesday with Dr. Danise Mina. Patients weight was up slightly currently 187 pounds per Stephen [name]. Stephen [name] reports patients hemoglobin A1c was 6.5 and patient continues to take 1 tablet of glucophage daily. Patient continues to use his cane when he ambulates. Patient goes to [facility] [employer] several days per week and Stephen [name] reports she picks patient up an hour early as he tires easily. Stephen [name] reports Dr. Danise Mina may request CT scan to determine if nodule in patients lung is enlarging. Patient has no symptoms of what Dr Tasia Catchings thought was a malignant lesion. Family made the decision not to have any work up and wanted patient kept comfortable.  Patient denies pain at the present time. Patient has no shortness of breath or cough per Stephen [name]. Patients appetite is good. Stephen [name] reports she and patient made trip to [place] over the weekend and visit with family. Patient has been sleeping well and naps throughout the day. Patients vital signs are stable. Patients breath sounds are slightly diminished in the bases. Patient has trace edema in his lower extremities left greater than the right. Nurse reviewed patient's medications with Stephen [name]. Patient and Stephen [name] remain  in agreement with palliative care services. Stephen [name] encouraged to contact palliative care with questions or concerns.      HISTORY OF PRESENT ILLNESS:  Patient is a 86 year old male who resides in senior living community with significant other Stephen [name].  Patient is followed by palliative care and is seen monthly and PRN.  CODE STATUS: DNR  ADVANCED DIRECTIVES: Y MOST FORM: Yes PPS: 50%   PHYSICAL EXAM:   VITALS: Today's Vitals   06/10/20 1030  BP: 102/78  Pulse: 80  Resp: 16  Temp: 97.7 F (36.5 C)  TempSrc: Temporal  SpO2: 98%  Weight: 187 lb (84.8 kg)  PainSc: 0-No pain    LUNGS: decreased breath sounds CARDIAC: Cor RRR  EXTREMITIES: Trace edema left greater than the right SKIN: Skin color, texture, turgor normal. No rashes or lesions  NEURO: positive for gait problems and memory problems       Nilda Simmer, RN

## 2020-06-11 ENCOUNTER — Encounter: Payer: Self-pay | Admitting: Family Medicine

## 2020-06-11 NOTE — Assessment & Plan Note (Addendum)
S/p onc eval and tumor board review 08/2019 - rec palliative care with option to consider radiation treatment to nodule, family in agreement. He is overall largely asymptomatic from respiratory standpoint. Discussed option of repeat imaging to help decide on rad onc eval - will reassess at [facility] later this year.

## 2020-06-11 NOTE — Assessment & Plan Note (Signed)
Offered labs today - we will defer until wellness visit later this year.

## 2020-06-11 NOTE — Assessment & Plan Note (Signed)
Stable period on namenda and abilify, has seen neurology. Doing well attending [facility] [place] 2d/wk.

## 2020-06-11 NOTE — Assessment & Plan Note (Deleted)
Stable period on abilify.

## 2020-06-11 NOTE — Assessment & Plan Note (Signed)
Continues aspirin, plavix, statin.

## 2020-06-11 NOTE — Assessment & Plan Note (Signed)
Known LICA occlusion s/p stroke, RICA with stent. Closely followed by [facility]

## 2020-06-11 NOTE — Assessment & Plan Note (Addendum)
Due to large sliding HH s/p reassuring EGD 2020

## 2020-06-27 ENCOUNTER — Other Ambulatory Visit: Payer: Self-pay | Admitting: Family Medicine

## 2020-07-07 ENCOUNTER — Telehealth: Payer: Self-pay

## 2020-07-07 ENCOUNTER — Other Ambulatory Visit: Payer: Self-pay | Admitting: Family Medicine

## 2020-07-07 NOTE — Telephone Encounter (Signed)
Plavix Last filled:  04/09/20, #90 Last OV:  06/08/20, 6 mo f/u Next OV:  09/12/20, [facility] prt 2

## 2020-07-07 NOTE — Telephone Encounter (Signed)
Telephone call to schedule palliative care visit.  Patients significant other [name] in agreement with RN makin ghome visit 07/11/20 at 10:00 AM.

## 2020-07-08 ENCOUNTER — Ambulatory Visit: Payer: Medicare Other | Admitting: Podiatry

## 2020-07-10 ENCOUNTER — Other Ambulatory Visit: Payer: Self-pay | Admitting: Family Medicine

## 2020-07-11 ENCOUNTER — Other Ambulatory Visit: Payer: Medicare Other

## 2020-07-11 ENCOUNTER — Other Ambulatory Visit: Payer: Self-pay

## 2020-07-11 VITALS — BP 106/70 | HR 89 | Temp 98.4°F | Resp 16 | Wt 187.0 lb

## 2020-07-11 DIAGNOSIS — Z515 Encounter for palliative care: Secondary | ICD-10-CM

## 2020-07-11 NOTE — Telephone Encounter (Signed)
ERx 

## 2020-07-11 NOTE — Progress Notes (Signed)
PATIENT NAME: Stephen [name] DOB: September 13, 1927 MRN: [id number]  PRIMARY CARE PROVIDER: Ria Bush, MD  RESPONSIBLE PARTY:  Acct ID - Stephen [name] Home Phone Work Phone Relationship Acct Type  [id number] PIOTR, [name]* [phone]  Self P/F     [address], [place], [place] [address]    PLAN OF CARE and INTERVENTIONS:               1.  GOALS OF CARE/ ADVANCE CARE PLANNING:  Remain in home with significant other, [name] and be comfortable.               2.  PATIENT/CAREGIVER EDUCATION:  Education on fall precautions, education on s/s of infection, reviewed meds, support               3.  DISEASE STATUS: RN made scheduled palliative care home visit. Nurse met with patient and his significant other [name]. [name] reports patient is not having a good day. [name] reports patient is more confused and unsteady on his feet this morning. [name] reports she is going to get patients walker out so he can be more steady with ambulating. Patient has had no MD appointments or changes in current medications since nurse made visit last month. Patient is scheduled to see Dr. Danise Mina in October and [name] reports patient may have scan to see if pulmonary nodule has enlarged. Patient continues to go to [facility] 2 days per week on Tuesdays and Thursdays to allow [name] to get out. [name] reports patient is getting new glasses. Patient denies pain at the present time. Patients appetite remains good and patients current weight is 187 lbs. Patient denies having any shortness of breath. [name] reports patient has an occasional non productive cough. [name] reports patient does cough with swallowing at times. Patient continues to need [name]'s assist with showering and [name] reports patient was very slow when he attempted to shave this morning. Patient takes stool softeners daily and [name] reports patients bowels move every 2 to 3 days. [name] reports patient has had no anxiety. Patient has not suffered any recent falls.  Patient has edema in his lower extremities that is non pitting, right greater than the left. Patient has no visible open areas of skin breakdown. Patients vital signs are stable. Patient and [name] remain in agreement with palliative care services. Patient and [name] encouraged to contact palliative care with questions or concerns.     HISTORY OF PRESENT ILLNESS:  Patient is a 86 year old male who resides in home with his significant other, [name].  Patient is followed by palliative care and is seen monthly and PRN.    CODE STATUS: DNR  ADVANCED DIRECTIVES: Y MOST FORM: No PPS: 50%   PHYSICAL EXAM:   VITALS: Today's Vitals   07/11/20 1020  BP: 106/70  Pulse: 89  Resp: 16  Temp: 98.4 F (36.9 C)  TempSrc: Temporal  SpO2: 99%  Weight: 187 lb (84.8 kg)  PainSc: 0-No pain    LUNGS: clear to auscultation  CARDIAC: Cor RRR  EXTREMITIES: non pitting edema to LE s right greater then the left SKIN: no visible open areas of skin breakdown  NEURO: positive for gait problems, memory problems and weakness       Stephen Simmer, RN

## 2020-07-11 NOTE — Telephone Encounter (Signed)
Name of Medication: Clonazepam Name of Pharmacy: [facility] Last Venida Jarvis or Written Date and Quantity: 06/16/20, #30 Last Office Visit and Type: 06/08/20, 6 mo f/u Next Office Visit and Type: 09/12/20, AWV prt 2 Last Controlled Substance Agreement Date: none Last UDS: none

## 2020-07-20 ENCOUNTER — Other Ambulatory Visit: Payer: Self-pay | Admitting: Family Medicine

## 2020-07-21 ENCOUNTER — Other Ambulatory Visit: Payer: Self-pay | Admitting: Family Medicine

## 2020-07-22 ENCOUNTER — Ambulatory Visit: Payer: Medicare Other | Admitting: Podiatry

## 2020-07-25 ENCOUNTER — Encounter: Payer: Self-pay | Admitting: Family Medicine

## 2020-07-29 ENCOUNTER — Encounter: Payer: Self-pay | Admitting: Podiatry

## 2020-07-29 ENCOUNTER — Other Ambulatory Visit: Payer: Self-pay

## 2020-07-29 ENCOUNTER — Ambulatory Visit (INDEPENDENT_AMBULATORY_CARE_PROVIDER_SITE_OTHER): Payer: Medicare Other | Admitting: Podiatry

## 2020-07-29 DIAGNOSIS — M79675 Pain in left toe(s): Secondary | ICD-10-CM | POA: Diagnosis not present

## 2020-07-29 DIAGNOSIS — L989 Disorder of the skin and subcutaneous tissue, unspecified: Secondary | ICD-10-CM

## 2020-07-29 DIAGNOSIS — B351 Tinea unguium: Secondary | ICD-10-CM

## 2020-07-29 DIAGNOSIS — M79674 Pain in right toe(s): Secondary | ICD-10-CM | POA: Diagnosis not present

## 2020-07-29 DIAGNOSIS — E0843 Diabetes mellitus due to underlying condition with diabetic autonomic (poly)neuropathy: Secondary | ICD-10-CM

## 2020-08-03 ENCOUNTER — Telehealth: Payer: Self-pay | Admitting: Family Medicine

## 2020-08-03 NOTE — Telephone Encounter (Signed)
Stephen [name] came into office and dropped off mail requesting prior authorization for pantoprazole (PROTONIX) 40 MG tablet. Please advise. Number listed on paper and placed on cart.

## 2020-08-07 NOTE — Progress Notes (Signed)
SUBJECTIVE Patient with a history of diabetes mellitus presents to office today complaining of elongated, thickened nails that cause pain while ambulating in shoes.  He is unable to trim his own nails. Patient is here for further evaluation and treatment.   Past Medical History:  Diagnosis Date  . Arthritis of knee   . BPH (benign prostatic hypertrophy)    w/ nocturia  . Coronary artery disease 03/2014   Inferior ST elevation myocardial infarction. Cardiac catheterization showed an occluded mid RCA. He had an angioplasty and drug-eluting stent placement with a 3.0 x 16 mm Promus drug-eluting stent. Ejection fraction was 45% by echo, completed cardiac rehab 06/2014  . CVA (cerebral vascular accident) ([place]) 03/2018  . Dementia ([place])    s/p stroke  . Diabetes type 2, controlled ([place])    Pt is taking Metformin  . GERD (gastroesophageal reflux disease)   . Heart murmur    keeping an eye on it but not treating  . History of basal cell cancer    s/p mohs  . History of colon polyps   . History of hiatal hernia 05/2018   this causes laryngeal spasms. pt was taking nitro for this when bp drops and he ends up with tia symptoms  . History of hypertension   . HLD (hyperlipidemia)    diet controlled in past  . Hypertension   . Insomnia    treated with multiple meds in past  . Lung cancer ([place])   . MI (myocardial infarction) ([place]) 2015  . Rosacea   . Skin cancer   . Sleep apnea    has cpap but does not use properly and does not like it!!  . Stroke ([place]) 03/2018   per cat scan, patient has had several strokes  . TIA (transient ischemic attack)    x 2 since CVA 03-2018  . UTI (urinary tract infection)   . Wears dentures    partial lower  . Wears hearing aid in both ears     OBJECTIVE General Patient is awake, alert, and oriented x 3 and in no acute distress. Derm Skin is dry and supple bilateral. Negative open lesions or macerations. Remaining integument unremarkable. Nails are  tender, long, thickened and dystrophic with subungual debris, consistent with onychomycosis, 1-5 bilateral. No signs of infection noted.  There is also some hyperkeratotic preulcerative callus tissue noted to the bilateral feet Vasc  DP and PT pedal pulses palpable bilaterally. Temperature gradient within normal limits.  Neuro Epicritic and protective threshold sensation diminished bilaterally.  Musculoskeletal Exam No symptomatic pedal deformities noted bilateral. Muscular strength within normal limits.  ASSESSMENT 1. Diabetes Mellitus w/ peripheral neuropathy 2. Onychomycosis of nail due to dermatophyte bilateral 3.  Preulcerative callus bilateral feet  4.  Pain in foot bilateral  PLAN OF CARE 1. Patient evaluated today. 2. Instructed to maintain good pedal hygiene and foot care. Stressed importance of controlling blood sugar.  3. Mechanical debridement of nails 1-5 bilaterally performed using a nail nipper. Filed with dremel without incident.  4.  Excisional debridement of the preulcerative callus tissue was performed using a tissue nipper without incident or bleeding  5.  Return to clinic in 3 mos.     Edrick Kins, DPM [facility]  Dr. Edrick Kins, DPM    [address]  [place], [place] [address]                Office [phone]  Fax [phone]

## 2020-08-08 ENCOUNTER — Telehealth: Payer: Self-pay | Admitting: Family Medicine

## 2020-08-08 NOTE — Telephone Encounter (Signed)
LVM for pt to rtn my call to r/s appt with [facility] on 09/05/20.

## 2020-08-10 ENCOUNTER — Telehealth: Payer: Self-pay

## 2020-08-10 NOTE — Telephone Encounter (Signed)
Palliative care SW outreached patient/spouse to complete telephonic visit. Awaiting return call.

## 2020-08-11 ENCOUNTER — Telehealth: Payer: Self-pay

## 2020-08-11 NOTE — Telephone Encounter (Signed)
Submitted PA via phn.  Decision pending.

## 2020-08-11 NOTE — Telephone Encounter (Signed)
Palliative care SW outreached patient to complete telephonic visit. Wife provided update on medical condition and/or changes. Wife shared that they have been doing very well. Wife reports no pain. No recent falls. Patient eats well and is sleeping okay. Patient recently saw podiatrist for toenails. No medication changes. No SHOB or coughs. Patient continues to go to the [facility] dementia program 2 days a week at [facility]. Wife appreciative of telephonic check in. Palliative care will continue to monitor and assist with long term care planning as needed.

## 2020-08-12 NOTE — Telephone Encounter (Signed)
Received faxed PA approval, valid 07/12/2020- 08/11/2021.

## 2020-09-02 DIAGNOSIS — Z23 Encounter for immunization: Secondary | ICD-10-CM | POA: Diagnosis not present

## 2020-09-05 ENCOUNTER — Other Ambulatory Visit: Payer: Self-pay | Admitting: Family Medicine

## 2020-09-05 ENCOUNTER — Ambulatory Visit: Payer: Medicare Other

## 2020-09-06 ENCOUNTER — Other Ambulatory Visit: Payer: Self-pay | Admitting: Family Medicine

## 2020-09-06 DIAGNOSIS — E1169 Type 2 diabetes mellitus with other specified complication: Secondary | ICD-10-CM

## 2020-09-06 DIAGNOSIS — E785 Hyperlipidemia, unspecified: Secondary | ICD-10-CM

## 2020-09-06 DIAGNOSIS — N183 Chronic kidney disease, stage 3 unspecified: Secondary | ICD-10-CM

## 2020-09-07 ENCOUNTER — Other Ambulatory Visit (INDEPENDENT_AMBULATORY_CARE_PROVIDER_SITE_OTHER): Payer: Medicare Other

## 2020-09-07 ENCOUNTER — Other Ambulatory Visit: Payer: Self-pay

## 2020-09-07 DIAGNOSIS — N183 Chronic kidney disease, stage 3 unspecified: Secondary | ICD-10-CM

## 2020-09-07 DIAGNOSIS — E1169 Type 2 diabetes mellitus with other specified complication: Secondary | ICD-10-CM

## 2020-09-07 DIAGNOSIS — E785 Hyperlipidemia, unspecified: Secondary | ICD-10-CM

## 2020-09-07 DIAGNOSIS — E1122 Type 2 diabetes mellitus with diabetic chronic kidney disease: Secondary | ICD-10-CM

## 2020-09-07 LAB — CBC WITH DIFFERENTIAL/PLATELET
Basophils Absolute: 0.1 10*3/uL (ref 0.0–0.1)
Basophils Relative: 0.8 % (ref 0.0–3.0)
Eosinophils Absolute: 0.3 10*3/uL (ref 0.0–0.7)
Eosinophils Relative: 2.9 % (ref 0.0–5.0)
HCT: 37.9 % — ABNORMAL LOW (ref 39.0–52.0)
Hemoglobin: 12.3 g/dL — ABNORMAL LOW (ref 13.0–17.0)
Lymphocytes Relative: 26.9 % (ref 12.0–46.0)
Lymphs Abs: 2.4 10*3/uL (ref 0.7–4.0)
MCHC: 32.4 g/dL (ref 30.0–36.0)
MCV: 83.9 fl (ref 78.0–100.0)
Monocytes Absolute: 0.7 10*3/uL (ref 0.1–1.0)
Monocytes Relative: 7.2 % (ref 3.0–12.0)
Neutro Abs: 5.6 10*3/uL (ref 1.4–7.7)
Neutrophils Relative %: 62.2 % (ref 43.0–77.0)
Platelets: 339 10*3/uL (ref 150.0–400.0)
RBC: 4.51 Mil/uL (ref 4.22–5.81)
RDW: 17 % — ABNORMAL HIGH (ref 11.5–15.5)
WBC: 9.1 10*3/uL (ref 4.0–10.5)

## 2020-09-07 LAB — LIPID PANEL
Cholesterol: 122 mg/dL (ref 0–200)
HDL: 50.3 mg/dL (ref >39.00)
LDL Cholesterol: 36 mg/dL (ref 0–99)
NonHDL: 71.87
Total CHOL/HDL Ratio: 2
Triglycerides: 180 mg/dL — ABNORMAL HIGH (ref 0.0–149.0)
VLDL: 36 mg/dL (ref 0.0–40.0)

## 2020-09-07 LAB — COMPREHENSIVE METABOLIC PANEL
ALT: 9 U/L (ref 0–53)
AST: 18 U/L (ref 0–37)
Albumin: 4.1 g/dL (ref 3.5–5.2)
Alkaline Phosphatase: 55 U/L (ref 39–117)
BUN: 16 mg/dL (ref 6–23)
CO2: 26 mEq/L (ref 19–32)
Calcium: 9.7 mg/dL (ref 8.4–10.5)
Chloride: 102 mEq/L (ref 96–112)
Creatinine, Ser: 1.24 mg/dL (ref 0.40–1.50)
GFR: 51.02 mL/min — ABNORMAL LOW (ref >60.00)
Glucose, Bld: 104 mg/dL — ABNORMAL HIGH (ref 70–99)
Potassium: 4.4 mEq/L (ref 3.5–5.1)
Sodium: 137 mEq/L (ref 135–145)
Total Bilirubin: 0.6 mg/dL (ref 0.2–1.2)
Total Protein: 7.2 g/dL (ref 6.0–8.3)

## 2020-09-07 LAB — HEMOGLOBIN A1C: Hgb A1c MFr Bld: 7.2 % — ABNORMAL HIGH (ref 4.6–6.5)

## 2020-09-07 LAB — VITAMIN D 25 HYDROXY (VIT D DEFICIENCY, FRACTURES): VITD: 44.22 ng/mL (ref 30.00–100.00)

## 2020-09-08 ENCOUNTER — Telehealth: Payer: Self-pay

## 2020-09-08 NOTE — Telephone Encounter (Signed)
4:00PM: Palliative care SW outreached patient to conduct telephonic visit.  HIPPA compliant vm left. Awaiting return call.

## 2020-09-12 ENCOUNTER — Other Ambulatory Visit: Payer: Self-pay

## 2020-09-12 ENCOUNTER — Ambulatory Visit (INDEPENDENT_AMBULATORY_CARE_PROVIDER_SITE_OTHER): Payer: Medicare Other | Admitting: Family Medicine

## 2020-09-12 ENCOUNTER — Encounter: Payer: Self-pay | Admitting: Family Medicine

## 2020-09-12 VITALS — BP 126/68 | HR 96 | Temp 97.5°F | Wt 186.4 lb

## 2020-09-12 DIAGNOSIS — R911 Solitary pulmonary nodule: Secondary | ICD-10-CM

## 2020-09-12 DIAGNOSIS — D649 Anemia, unspecified: Secondary | ICD-10-CM

## 2020-09-12 DIAGNOSIS — I63239 Cerebral infarction due to unspecified occlusion or stenosis of unspecified carotid arteries: Secondary | ICD-10-CM

## 2020-09-12 DIAGNOSIS — K449 Diaphragmatic hernia without obstruction or gangrene: Secondary | ICD-10-CM

## 2020-09-12 DIAGNOSIS — Z7189 Other specified counseling: Secondary | ICD-10-CM | POA: Diagnosis not present

## 2020-09-12 DIAGNOSIS — G309 Alzheimer's disease, unspecified: Secondary | ICD-10-CM | POA: Diagnosis not present

## 2020-09-12 DIAGNOSIS — N1831 Chronic kidney disease, stage 3a: Secondary | ICD-10-CM

## 2020-09-12 DIAGNOSIS — G4709 Other insomnia: Secondary | ICD-10-CM | POA: Diagnosis not present

## 2020-09-12 DIAGNOSIS — Z66 Do not resuscitate: Secondary | ICD-10-CM

## 2020-09-12 DIAGNOSIS — Z8673 Personal history of transient ischemic attack (TIA), and cerebral infarction without residual deficits: Secondary | ICD-10-CM | POA: Diagnosis not present

## 2020-09-12 DIAGNOSIS — N183 Chronic kidney disease, stage 3 unspecified: Secondary | ICD-10-CM

## 2020-09-12 DIAGNOSIS — F0280 Dementia in other diseases classified elsewhere without behavioral disturbance: Secondary | ICD-10-CM

## 2020-09-12 DIAGNOSIS — E1122 Type 2 diabetes mellitus with diabetic chronic kidney disease: Secondary | ICD-10-CM | POA: Diagnosis not present

## 2020-09-12 DIAGNOSIS — K5900 Constipation, unspecified: Secondary | ICD-10-CM | POA: Diagnosis not present

## 2020-09-12 DIAGNOSIS — F0150 Vascular dementia without behavioral disturbance: Secondary | ICD-10-CM

## 2020-09-12 DIAGNOSIS — R011 Cardiac murmur, unspecified: Secondary | ICD-10-CM | POA: Diagnosis not present

## 2020-09-12 MED ORDER — CLONAZEPAM 1 MG PO TABS: 0.5000 mg | ORAL_TABLET | Freq: Every day | ORAL | 5 refills | Status: DC

## 2020-09-12 NOTE — Patient Instructions (Addendum)
We will order CT lungs to follow nodule.  You are doing well today.  Continue current medicines. Return as needed or in 6 months for follow up visit.   Health Maintenance After Age 84 After age 40, you are at a higher risk for certain long-term diseases and infections as well as injuries from falls. Falls are a major cause of broken bones and head injuries in people who are older than age 65. Getting regular preventive care can help to keep you healthy and well. Preventive care includes getting regular testing and making lifestyle changes as recommended by your health care provider. Talk with your health care provider about:  Which screenings and tests you should have. A screening is a test that checks for a disease when you have no symptoms.  A diet and exercise plan that is right for you. What should I know about screenings and tests to prevent falls? Screening and testing are the best ways to find a health problem early. Early diagnosis and treatment give you the best chance of managing medical conditions that are common after age 32. Certain conditions and lifestyle choices may make you more likely to have a fall. Your health care provider may recommend:  Regular vision checks. Poor vision and conditions such as cataracts can make you more likely to have a fall. If you wear glasses, make sure to get your prescription updated if your vision changes.  Medicine review. Work with your health care provider to regularly review all of the medicines you are taking, including over-the-counter medicines. Ask your health care provider about any side effects that may make you more likely to have a fall. Tell your health care provider if any medicines that you take make you feel dizzy or sleepy.  Osteoporosis screening. Osteoporosis is a condition that causes the bones to get weaker. This can make the bones weak and cause them to break more easily.  Blood pressure screening. Blood pressure changes and  medicines to control blood pressure can make you feel dizzy.  Strength and balance checks. Your health care provider may recommend certain tests to check your strength and balance while standing, walking, or changing positions.  Foot health exam. Foot pain and numbness, as well as not wearing proper footwear, can make you more likely to have a fall.  Depression screening. You may be more likely to have a fall if you have a fear of falling, feel emotionally low, or feel unable to do activities that you used to do.  Alcohol use screening. Using too much alcohol can affect your balance and may make you more likely to have a fall. What actions can I take to lower my risk of falls? General instructions  Talk with your health care provider about your risks for falling. Tell your health care provider if: ? You fall. Be sure to tell your health care provider about all falls, even ones that seem minor. ? You feel dizzy, sleepy, or off-balance.  Take over-the-counter and prescription medicines only as told by your health care provider. These include any supplements.  Eat a healthy diet and maintain a healthy weight. A healthy diet includes low-fat dairy products, low-fat (lean) meats, and fiber from whole grains, beans, and lots of fruits and vegetables. Home safety  Remove any tripping hazards, such as rugs, cords, and clutter.  Install safety equipment such as grab bars in bathrooms and safety rails on stairs.  Keep rooms and walkways well-lit. Activity   Follow a regular exercise  program to stay fit. This will help you maintain your balance. Ask your health care provider what types of exercise are appropriate for you.  If you need a cane or walker, use it as recommended by your health care provider.  Wear supportive shoes that have nonskid soles. Lifestyle  Do not drink alcohol if your health care provider tells you not to drink.  If you drink alcohol, limit how much you have: ? 0-1  drink a day for women. ? 0-2 drinks a day for men.  Be aware of how much alcohol is in your drink. In the [place] one drink equals one typical bottle of beer (12 oz), one-half glass of wine (5 oz), or one shot of hard liquor (1 oz).  Do not use any products that contain nicotine or tobacco, such as cigarettes and e-cigarettes. If you need help quitting, ask your health care provider. Summary  Having a healthy lifestyle and getting preventive care can help to protect your health and wellness after age 22.  Screening and testing are the best way to find a health problem early and help you avoid having a fall. Early diagnosis and treatment give you the best chance for managing medical conditions that are more common for people who are older than age 72.  Falls are a major cause of broken bones and head injuries in people who are older than age 93. Take precautions to prevent a fall at home.  Work with your health care provider to learn what changes you can make to improve your health and wellness and to prevent falls. This information is not intended to replace advice given to you by your health care provider. Make sure you discuss any questions you have with your health care provider. Document Revised: 02/19/2019 Document Reviewed: 09/11/2017 Elsevier Patient Education  2020 [employer].

## 2020-09-12 NOTE — Assessment & Plan Note (Signed)
Chronic, longterm on klonopin - will continue as tolerated well.  Failed other treatments in the past.

## 2020-09-12 NOTE — Assessment & Plan Note (Signed)
Chronic, stable. Continue current regimen of metformin 500mg  once daily.

## 2020-09-12 NOTE — Progress Notes (Signed)
This visit was conducted in person.  BP 126/68 (BP Location: Left Arm, Patient Position: Sitting, Cuff Size: Normal)   Pulse 96   Temp (!) 97.5 F (36.4 C) (Temporal)   Wt 186 lb 6 oz (84.5 kg)   SpO2 98%   BMI 28.34 kg/m    CC: AMW f/u visit  Subjective:    Patient ID: Stephen [name], male    DOB: 1927-07-09, 86 y.o.   MRN: [id number]  HPI: Stephen [name] is a 85 y.o. male presenting on 09/12/2020 for Medicare Wellness (Pt accompanied by significant other, [name]- temp 97.9.)   AMW scheduled for next week.    Hearing Screening   125Hz  250Hz  500Hz  1000Hz  2000Hz  3000Hz  4000Hz  6000Hz  8000Hz   Right ear:           Left ear:           Comments: Wears bilateral hearing aids.  Wearing at today's OV.   Vision Screening Comments: Last eye exam, Jun or Jul/2021.    Office Visit from 06/02/2019 in [place] at [place]  PHQ-2 Total Score 0      Fall Risk  09/12/2020 06/02/2019 12/20/2017 12/17/2016 12/12/2015  Falls in the past year? 0 0 No No No    Here with partner [name] today who notes some increased cough but no dyspnea or chest pain, no dysphagia. They note some coughing with swallowing. Managing constipation with miralax - concern metamucil may have caused some esophageal trouble leading to expectorating phlegm, mucous.   Known mixed vascular/alz dementia on aspirin, plavix, pravastatin, namenda and abilify. Sees neurology Manuella Ghazi).   Known LICA occlusion s/p stroke, RICA stent widely patent.   Saw Dr Miachel Roux oncology 09/2019 - for concerning suspicious LUL nodule and increased metabolic activity of distal esophageal wall. EGD was reassuring only showing large hiatal hernia. Case reviewed at tumor board - rec palliative care only, with option to see rad onc for radiation to the lung nodulein future if desired. Established with [facility] palliative care 09/2019. Seeing monthly.   H/o L MCA nonhemorrhagic infarcts presenting with dysarthria aphasia and R sided facial  weakness 03/2018 s/p R carotid stent. Continues klonopin at night for RLS as well as CPAP for OSA.   Established with podiatry.   Preventative: Colonoscopy done 2011with HPwould be due for rpt 2016 (done at [facility]). iFOB negative 2016. Agedout.  Prostatecancer screening- age out Flu yearly COVID vaccine moderna 11/2019, 12/2019, planned booster soon Pneumvax 2012, prevnar 2016 Td 05/2008  zostavax 05/2008 Shingrix - discussed - declines for now  Advanced directive discussion:Detailed advanced directivescannedintochart 03/2014: HCPOA - Osvaldo Angst. Does not want prolonged life support. Would be ok with feeding tube. DNR discussed 04/2018 Seat belt use discussed Sunscreen use discussed. No changing moles on skin. Known h/o rosacea.Sees derm yearly.  Ex smoker remotely  Alcohol -quit recently Dentist yearly  Eye exam yearly, had cataract removed  Bowel - constipation managed with miralax/metamucil Bladder - mild urge incontinence - has started wearing depends.   Lives at [facility] with friend - Osvaldo Angst RN.  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  Diet: good water, vegetarian- takes b12 vitamin     Relevant past medical, surgical, family and social history reviewed and updated as indicated. Interim medical history since our last visit reviewed. Allergies  and medications reviewed and updated. Outpatient Medications Prior to Visit  Medication Sig Dispense Refill  . acetaminophen (TYLENOL) 500 MG tablet Take 1 tablet (500 mg total) by mouth 2 (two) times daily. (Patient taking differently: Take 1,000 mg by mouth 2 (two) times daily. ) 30 tablet 0  . ARIPiprazole (ABILIFY) 2 MG tablet Take 1 tablet by mouth daily.    Marland Kitchen aspirin EC 81 MG tablet Take 81 mg by mouth every morning.    . clopidogrel  (PLAVIX) 75 MG tablet TAKE 1 TABLET (75 MG TOTAL) BY MOUTH AT BEDTIME. 90 tablet 1  . finasteride (PROSCAR) 5 MG tablet TAKE 1 TABLET BY MOUTH EVERY DAY 90 tablet 0  . memantine (NAMENDA) 10 MG tablet TAKE 1 TABLET BY MOUTH TWICE A DAY 180 tablet 2  . metFORMIN (GLUCOPHAGE) 500 MG tablet Take 1 tablet (500 mg total) by mouth daily with breakfast. 90 tablet 3  . metroNIDAZOLE (METROGEL) 1 % gel Apply 1 application topically daily.    . Multiple Vitamin (MULTIVITAMIN WITH MINERALS) TABS tablet Take 1 tablet by mouth daily.    . pantoprazole (PROTONIX) 40 MG tablet TAKE 1 TABLET BY MOUTH TWICE A DAY 180 tablet 0  . pravastatin (PRAVACHOL) 40 MG tablet TAKE 1 TABLET BY MOUTH EVERY DAY 90 tablet 0  . vitamin B-12 (CYANOCOBALAMIN) 500 MCG tablet Take 500 mcg by mouth daily.    . clonazePAM (KLONOPIN) 1 MG tablet TAKE 1/2 TO 1 TABLET BY MOUTH AT BEDTIME 30 tablet 1   No facility-administered medications prior to visit.     Per HPI unless specifically indicated in ROS section below Review of Systems Objective:  BP 126/68 (BP Location: Left Arm, Patient Position: Sitting, Cuff Size: Normal)   Pulse 96   Temp (!) 97.5 F (36.4 C) (Temporal)   Wt 186 lb 6 oz (84.5 kg)   SpO2 98%   BMI 28.34 kg/m   Wt Readings from Last 3 Encounters:  09/12/20 186 lb 6 oz (84.5 kg)  07/11/20 187 lb (84.8 kg)  06/10/20 187 lb (84.8 kg)      Physical Exam Vitals and nursing note reviewed.  Constitutional:      General: He is not in acute distress.    Appearance: Normal appearance. He is well-developed. He is not ill-appearing.  HENT:     Head: Normocephalic and atraumatic.     Right Ear: Hearing normal.     Left Ear: Hearing normal.  Eyes:     General: No scleral icterus.    Extraocular Movements: Extraocular movements intact.     Conjunctiva/sclera: Conjunctivae normal.     Pupils: Pupils are equal, round, and reactive to light.  Cardiovascular:     Rate and Rhythm: Normal rate and regular rhythm.      Pulses: Normal pulses.          Radial pulses are 2+ on the right side and 2+ on the left side.     Heart sounds: Murmur (3/6 systolic best at USB) heard.   Pulmonary:     Effort: Pulmonary effort is normal. No respiratory distress.     Breath sounds: Normal breath sounds. No wheezing, rhonchi or rales.  Abdominal:     General: Bowel sounds are normal. There is no distension.     Palpations: Abdomen is soft. There is no mass.     Tenderness: There is no abdominal tenderness. There is no guarding or rebound.     Hernia: No hernia is present.  Musculoskeletal:  General: Normal range of motion.     Cervical back: Normal range of motion and neck supple.     Right lower leg: No edema.     Left lower leg: No edema.  Lymphadenopathy:     Cervical: No cervical adenopathy.  Skin:    General: Skin is warm and dry.     Findings: No rash.  Neurological:     General: No focal deficit present.     Mental Status: He is alert and oriented to person, place, and time.     Comments: CN grossly intact, station and gait intact  Psychiatric:        Mood and Affect: Mood normal.        Behavior: Behavior normal.        Thought Content: Thought content normal.        Judgment: Judgment normal.       Results for orders placed or performed in visit on 09/07/20  VITAMIN D 25 Hydroxy (Vit-D Deficiency, Fractures)  Result Value Ref Range   VITD 44.22 30.00 - 100.00 ng/mL  CBC with Differential/Platelet  Result Value Ref Range   WBC 9.1 4.0 - 10.5 K/uL   RBC 4.51 4.22 - 5.81 Mil/uL   Hemoglobin 12.3 (L) 13.0 - 17.0 g/dL   HCT 37.9 (L) 39 - 52 %   MCV 83.9 78.0 - 100.0 fl   MCHC 32.4 30.0 - 36.0 g/dL   RDW 17.0 (H) 11.5 - 15.5 %   Platelets 339.0 150 - 400 K/uL   Neutrophils Relative % 62.2 43 - 77 %   Lymphocytes Relative 26.9 12 - 46 %   Monocytes Relative 7.2 3 - 12 %   Eosinophils Relative 2.9 0 - 5 %   Basophils Relative 0.8 0 - 3 %   Neutro Abs 5.6 1.4 - 7.7 K/uL   Lymphs Abs 2.4  0.7 - 4.0 K/uL   Monocytes Absolute 0.7 0.1 - 1.0 K/uL   Eosinophils Absolute 0.3 0.0 - 0.7 K/uL   Basophils Absolute 0.1 0.0 - 0.1 K/uL  Hemoglobin A1c  Result Value Ref Range   Hgb A1c MFr Bld 7.2 (H) 4.6 - 6.5 %  Comprehensive metabolic panel  Result Value Ref Range   Sodium 137 135 - 145 mEq/L   Potassium 4.4 3.5 - 5.1 mEq/L   Chloride 102 96 - 112 mEq/L   CO2 26 19 - 32 mEq/L   Glucose, Bld 104 (H) 70 - 99 mg/dL   BUN 16 6 - 23 mg/dL   Creatinine, Ser 1.24 0.40 - 1.50 mg/dL   Total Bilirubin 0.6 0.2 - 1.2 mg/dL   Alkaline Phosphatase 55 39 - 117 U/L   AST 18 0 - 37 U/L   ALT 9 0 - 53 U/L   Total Protein 7.2 6.0 - 8.3 g/dL   Albumin 4.1 3.5 - 5.2 g/dL   GFR 51.02 (L) >60.00 mL/min   Calcium 9.7 8.4 - 10.5 mg/dL  Lipid panel  Result Value Ref Range   Cholesterol 122 0 - 200 mg/dL   Triglycerides 180.0 (H) 0 - 149 mg/dL   HDL 50.30 >39.00 mg/dL   VLDL 36.0 0.0 - 40.0 mg/dL   LDL Cholesterol 36 0 - 99 mg/dL   Total CHOL/HDL Ratio 2    NonHDL 71.87    Assessment & Plan:  This visit occurred during the SARS-CoV-2 public health emergency.  Safety protocols were in place, including screening questions prior to the visit, additional usage of staff  PPE, and extensive cleaning of exam room while observing appropriate contact time as indicated for disinfecting solutions.   Problem List Items Addressed This Visit    Systolic murmur   Pulmonary nodule    Known 1.6cm LUL pulm nodule suspicious for bronchogenic Ca found last year. They are interested in repeat CT for new baseline, but decline PET scan. Would consider radiation therapy if indicated. Patient currently asymptomatic.       Relevant Orders   CT Chest Wo Contrast   Mixed Alzheimer's and vascular dementia ([place])    Chronic, stable on namenda, followed by neurology.       Relevant Medications   clonazePAM (KLONOPIN) 1 MG tablet   Insomnia    Chronic, longterm on klonopin - will continue as tolerated well.  Failed  other treatments in the past.       History of ischemic left MCA stroke    Continue aspirin, plavix, and statin.       Hiatal hernia   DNR (do not resuscitate)   Controlled type 2 diabetes mellitus with stage 3 chronic kidney disease, without long-term current use of insulin (H[facility])    Chronic, stable. Continue current regimen of metformin 500mg  once daily.       Constipation    Chronic, managed with miralax, finding optimal dosing      CKD (chronic kidney disease) stage 3, GFR 30-59 ml/min (H[facility])    Chronic, will continue to monitor.       Anemia, unspecified    Chronic, mild, improving. Continue to monitor.       Advanced care planning/counseling discussion - Primary    Advanced directive discussion:Detailed advanced directivescannedintochart 03/2014: HCPOA - Osvaldo Angst. Does not want prolonged life support. Would be ok with feeding tube. DNR discussed 04/2018          Meds ordered this encounter  Medications  . clonazePAM (KLONOPIN) 1 MG tablet    Sig: Take 0.5-1 tablets (0.5-1 mg total) by mouth at bedtime.    Dispense:  30 tablet    Refill:  5    Not to exceed 2 additional fills before 11/15/2020.   Orders Placed This Encounter  Procedures  . CT Chest Wo Contrast    Standing Status:   Future    Standing Expiration Date:   09/13/2021    Order Specific Question:   Preferred imaging location?    Answer:   [facility]    Patient instructions: We will order CT lungs to follow nodule.  You are doing well today.  Continue current medicines. Return as needed or in 6 months for follow up visit.   Follow up plan: Return in about 6 months (around 03/12/2021) for follow up visit.  Ria Bush, MD

## 2020-09-13 NOTE — Assessment & Plan Note (Signed)
Known 1.6cm LUL pulm nodule suspicious for bronchogenic Ca found last year. They are interested in repeat CT for new baseline, but decline PET scan. Would consider radiation therapy if indicated. Patient currently asymptomatic.

## 2020-09-13 NOTE — Assessment & Plan Note (Signed)
Chronic, stable on namenda, followed by neurology.

## 2020-09-13 NOTE — Assessment & Plan Note (Signed)
Chronic, mild, improving. Continue to monitor.

## 2020-09-13 NOTE — Assessment & Plan Note (Signed)
Continue aspirin, plavix, and statin.

## 2020-09-13 NOTE — Assessment & Plan Note (Signed)
Advanced directive discussion:Detailed advanced directivescannedintochart 03/2014: HCPOA - Stephen [name]. Does not want prolonged life support. Would be ok with feeding tube. DNR discussed 04/2018

## 2020-09-13 NOTE — Assessment & Plan Note (Signed)
Chronic, will continue to monitor.

## 2020-09-13 NOTE — Assessment & Plan Note (Addendum)
Chronic, managed with miralax, finding optimal dosing

## 2020-09-20 ENCOUNTER — Ambulatory Visit (INDEPENDENT_AMBULATORY_CARE_PROVIDER_SITE_OTHER): Payer: Medicare Other | Admitting: Cardiovascular Disease

## 2020-09-20 ENCOUNTER — Encounter: Payer: Self-pay | Admitting: Cardiovascular Disease

## 2020-09-20 ENCOUNTER — Ambulatory Visit: Payer: Medicare Other

## 2020-09-20 ENCOUNTER — Other Ambulatory Visit: Payer: Self-pay

## 2020-09-20 DIAGNOSIS — I10 Essential (primary) hypertension: Secondary | ICD-10-CM

## 2020-09-20 DIAGNOSIS — E785 Hyperlipidemia, unspecified: Secondary | ICD-10-CM | POA: Diagnosis not present

## 2020-09-20 DIAGNOSIS — I2510 Atherosclerotic heart disease of native coronary artery without angina pectoris: Secondary | ICD-10-CM | POA: Diagnosis not present

## 2020-09-20 DIAGNOSIS — I6523 Occlusion and stenosis of bilateral carotid arteries: Secondary | ICD-10-CM | POA: Diagnosis not present

## 2020-09-20 NOTE — Patient Instructions (Signed)
Medication Instructions:  Your physician has recommended you make the following change in your medication:   STOP Aspirin  *If you need a refill on your cardiac medications before your next appointment, please call your pharmacy*   Lab Work: None ordered If you have labs (blood work) drawn today and your tests are completely normal, you will receive your results only by: Marland Kitchen MyChart Message (if you have MyChart) OR . A paper copy in the mail If you have any lab test that is abnormal or we need to change your treatment, we will call you to review the results.   Testing/Procedures: None ordered   Follow-Up: At [facility], you and your health needs are our priority.  As part of our continuing mission to provide you with exceptional heart care, we have created designated Provider Care Teams.  These Care Teams include your primary Cardiologist (physician) and Advanced Practice Providers (APPs -  Physician Assistants and Nurse Practitioners) who all work together to provide you with the care you need, when you need it.  We recommend signing up for the patient portal called "MyChart".  Sign up information is provided on this After Visit Summary.  MyChart is used to connect with patients for Virtual Visits (Telemedicine).  Patients are able to view lab/test results, encounter notes, upcoming appointments, etc.  Non-urgent messages can be sent to your provider as well.   To learn more about what you can do with MyChart, go to [email or web address].    Your next appointment:   6 month(s)  The format for your next appointment:   In Person  Provider:   You may see Kathlyn Sacramento, MD or one of the following Advanced Practice Providers on your designated Care Team:    Murray Hodgkins, NP  Christell Faith, PA-C  Marrianne Mood, PA-C  Cadence Kathlen Mody, [place]    Other Instructions N/A

## 2020-09-20 NOTE — Progress Notes (Signed)
Cardiology Office Note   Date:  09/20/2020   ID:  Stephen [name], DOB [birth year], MRN [id number]  PCP:  Ria Bush, MD  Cardiologist:   Kathlyn Sacramento, MD   Chief Complaint  Patient presents with  . other    6 month follow up. Meds reviewed by the pt. verbally. Pt. c/o LE edema, mostly at bedtime, cough and mild shortness of breath.       History of Present Illness: Stephen [name] is a 86 y.o. male who presents for a followup visit regarding coronary artery disease. He had inferior ST elevation myocardial infarction in May 2015. Emergent cardiac catheterization showed an occluded mid RCA which was treated successfully with PCI and drug-eluting stent placement. Ejection fraction was 45%. He has chronic medical conditions that include type 2 diabetes, hypertension and hyperlipidemia. He is a vegetarian since age 70 and lives [facility]. He didn't tolerate Atorvastatin to myalgia.  He was hospitalized in May 2019 with a stroke.  He was found to have an occluded left carotid artery with significant stenosis of the right carotid artery.  Echo showed normal LV systolic function.  He was hospitalized in June 2019 with atypical chest pain.  Lexiscan Myoview showed fixed inferior wall defect with no ischemia. He had another TIA in July 2019.  He underwent right carotid artery stenting.  He has dementia which has been slowly progressing.  In addition, he has a known lung mass suspicious for cancer which is currently not being treated per his wishes.  He has been doing reasonably well and denies chest pain or shortness of breath.  He has intermittent cough.  Past Medical History:  Diagnosis Date  . Arthritis of knee   . BPH (benign prostatic hypertrophy)    w/ nocturia  . Coronary artery disease 03/2014   Inferior ST elevation myocardial infarction. Cardiac catheterization showed an occluded mid RCA. He had an angioplasty and drug-eluting stent  placement with a 3.0 x 16 mm Promus drug-eluting stent. Ejection fraction was 45% by echo, completed cardiac rehab 06/2014  . CVA (cerebral vascular accident) ([place]) 03/2018  . Dementia ([place])    s/p stroke  . Diabetes type 2, controlled ([place])    Pt is taking Metformin  . GERD (gastroesophageal reflux disease)   . Heart murmur    keeping an eye on it but not treating  . History of basal cell cancer    s/p mohs  . History of colon polyps   . History of hiatal hernia 05/2018   this causes laryngeal spasms. pt was taking nitro for this when bp drops and he ends up with tia symptoms  . History of hypertension   . HLD (hyperlipidemia)    diet controlled in past  . Hypertension   . Insomnia    treated with multiple meds in past  . Lung cancer ([place])   . MI (myocardial infarction) ([place]) 2015  . Rosacea   . Skin cancer   . Sleep apnea    has cpap but does not use properly and does not like it!!  . Stroke ([place]) 03/2018   per cat scan, patient has had several strokes  . TIA (transient ischemic attack)    x 2 since CVA 03-2018  . UTI (urinary tract infection)   . Wears dentures    partial lower  . Wears hearing aid in both ears     Past Surgical History:  Procedure Laterality Date  .  CARDIAC CATHETERIZATION  03/2014   [facility];x1 stent  . CAROTID PTA/STENT INTERVENTION Right 06/05/2018   Algernon Huxley, MD  . CATARACT EXTRACTION W/PHACO Left 10/09/2016   Procedure: CATARACT EXTRACTION PHACO AND INTRAOCULAR LENS PLACEMENT ([place]);  Surgeon: Birder Robson, MD;  Location: [facility] ORS;  Service: Ophthalmology;  Laterality: Left;  [place] 1.13 AP% 18.3 CDE 13.45 Fluid pack lot # [id number]  . CATARACT EXTRACTION W/PHACO Right 05/17/2020   Procedure: CATARACT EXTRACTION PHACO AND INTRAOCULAR LENS PLACEMENT ([facility]) RIGHT DIABETIC 14.51  01:37.4  ;  Surgeon: Birder Robson, MD;  Location: [place];  Service: Ophthalmology;  Laterality: Right;  Diabetic - oral meds Requests later AM arrival  .  COLONOSCOPY  08/2010   hyperplastic polyp, rec rpt 5 yrs  . ESOPHAGOGASTRODUODENOSCOPY  05/2015   dilated stricture, normal biopsies, HH, no definite infection [name] @ [facility])  . ESOPHAGOGASTRODUODENOSCOPY (EGD) WITH PROPOFOL N/A 09/18/2019   normal esophagus and duodenum, large HH ([place])  . EYE SURGERY Left    cataract extraction  . MOHS SURGERY     basal cell chin/back  . REPLACEMENT TOTAL KNEE Left 08/2006  . VASECTOMY  1970     Current Outpatient Medications  Medication Sig Dispense Refill  . acetaminophen (TYLENOL) 500 MG tablet Take 1 tablet (500 mg total) by mouth 2 (two) times daily. (Patient taking differently: Take 1,000 mg by mouth 2 (two) times daily. ) 30 tablet 0  . ARIPiprazole (ABILIFY) 2 MG tablet Take 1 tablet by mouth daily.    Marland Kitchen aspirin EC 81 MG tablet Take 81 mg by mouth every morning.    . clonazePAM (KLONOPIN) 1 MG tablet Take 0.5-1 tablets (0.5-1 mg total) by mouth at bedtime. 30 tablet 5  . clopidogrel (PLAVIX) 75 MG tablet TAKE 1 TABLET (75 MG TOTAL) BY MOUTH AT BEDTIME. 90 tablet 1  . finasteride (PROSCAR) 5 MG tablet TAKE 1 TABLET BY MOUTH EVERY DAY 90 tablet 0  . memantine (NAMENDA) 10 MG tablet TAKE 1 TABLET BY MOUTH TWICE A DAY 180 tablet 2  . metFORMIN (GLUCOPHAGE) 500 MG tablet Take 1 tablet (500 mg total) by mouth daily with breakfast. 90 tablet 3  . metroNIDAZOLE (METROGEL) 1 % gel Apply 1 application topically daily.    . Multiple Vitamin (MULTIVITAMIN WITH MINERALS) TABS tablet Take 1 tablet by mouth daily.    . pantoprazole (PROTONIX) 40 MG tablet TAKE 1 TABLET BY MOUTH TWICE A DAY 180 tablet 0  . pravastatin (PRAVACHOL) 40 MG tablet TAKE 1 TABLET BY MOUTH EVERY DAY 90 tablet 0  . vitamin B-12 (CYANOCOBALAMIN) 500 MCG tablet Take 500 mcg by mouth daily.     No current facility-administered medications for this visit.    Allergies:   Aricept [donepezil hcl], Belsomra [suvorexant], Vancomycin, Ambien [zolpidem tartrate], Atorvastatin, Doxycycline,  Penicillins, and Tamsulosin    Social History:  The patient  reports that he quit smoking about 57 years ago. His smoking use included cigarettes. He has never used smokeless tobacco. He reports previous alcohol use. He reports that he does not use drugs.   Family History:  The patient's family history includes CAD in his brother; CAD (age of onset: 76) in his brother; COPD in his brother; Cancer in his mother; Cancer (age of onset: 51) in his father; Parkinson's disease in his brother; Stroke in his sister.    ROS:  Please see the history of present illness.   Otherwise, review of systems are positive for none.   All other systems  are reviewed and negative.    PHYSICAL EXAM: VS:  BP 100/60 (BP Location: Left Arm, Patient Position: Sitting, Cuff Size: Normal)   Pulse 82   Ht 5\' 8"  (1.727 m)   Wt 187 lb 8 oz (85 kg)   SpO2 97%   BMI 28.51 kg/m  , BMI Body mass index is 28.51 kg/m. GEN: Well nourished, well developed, in no acute distress  HEENT: normal  Neck: no JVD, carotid bruits, or masses Cardiac: RRR; no rubs, or gallops,no edema . There is a 2/6 systolic ejection murmur in the aortic area Respiratory:  clear to auscultation bilaterally, normal work of breathing GI: soft, nontender, nondistended, + BS MS: no deformity or atrophy  Skin: warm and dry, no rash Neuro:  Strength and sensation are intact Psych: euthymic mood, full affect   EKG:  EKG is ordered today. The ekg ordered today demonstrates normal sinus rhythm with old inferior infarct.   Recent Labs: 09/07/2020: ALT 9; BUN 16; Creatinine, Ser 1.24; Hemoglobin 12.3; Platelets 339.0; Potassium 4.4; Sodium 137    Lipid Panel    Component Value Date/Time   CHOL 122 09/07/2020 0936   CHOL 124 05/04/2014 0806   TRIG 180.0 (H) 09/07/2020 0936   HDL 50.30 09/07/2020 0936   HDL 53 05/04/2014 0806   CHOLHDL 2 09/07/2020 0936   VLDL 36.0 09/07/2020 0936   LDLCALC 36 09/07/2020 0936   LDLCALC 48 05/04/2014 0806       Wt Readings from Last 3 Encounters:  09/20/20 187 lb 8 oz (85 kg)  09/12/20 186 lb 6 oz (84.5 kg)  07/11/20 187 lb (84.8 kg)       ASSESSMENT AND PLAN:  1.  Coronary artery disease involving native coronary arteries without angina: Overall, he is doing well with no anginal symptoms. I recommend continuing medical therapy.  I elected to discontinue aspirin and keep him on clopidogrel long-term given previous history of stroke and carotid stent in addition to coronary artery disease  2.  Carotid artery disease: Known occluded left carotid artery.  He is status post right carotid artery stenting.  3. Hyperlipidemia: Currently on pravastatin with most recent LDL of 36.  4.  Dementia: Gradual decline in memory but he continues to be alert and pleasant.  5. Lung mass suspicious for cancer: Palliative care following.  6.  Cardiac murmur suggestive of aortic sclerosis or mild stenosis.  I do not recommend an echocardiogram.  Even if he develops severe aortic stenosis, he is not a candidate for TAVR given his dementia and lung mass.   Disposition:   FU with me in 6 months  Signed,  Kathlyn Sacramento, MD  09/20/2020 2:29 PM    [place]

## 2020-09-21 ENCOUNTER — Telehealth: Payer: Self-pay

## 2020-09-21 NOTE — Telephone Encounter (Signed)
1045 am.  Phone call made to Stephen [name] to follow up on patient status.  Stephen [name] would like an in-person visit.  Visit scheduled for 11/17 @ 4 pm.

## 2020-09-23 ENCOUNTER — Ambulatory Visit: Payer: Medicare Other

## 2020-09-27 DIAGNOSIS — Z23 Encounter for immunization: Secondary | ICD-10-CM | POA: Diagnosis not present

## 2020-09-28 ENCOUNTER — Other Ambulatory Visit: Payer: Self-pay

## 2020-09-28 ENCOUNTER — Other Ambulatory Visit: Payer: Medicare Other

## 2020-09-28 VITALS — BP 106/78 | HR 77 | Temp 97.7°F

## 2020-09-28 DIAGNOSIS — Z515 Encounter for palliative care: Secondary | ICD-10-CM

## 2020-09-29 NOTE — Progress Notes (Signed)
PATIENT NAME: Stephen [name] DOB: 1927/10/31 MRN: [id number]  PRIMARY CARE PROVIDER: Ria Bush, MD  RESPONSIBLE PARTY:  Acct ID - Guarantor Home Phone Work Phone Relationship Acct Type  [id number] [name], [name] [phone]  Self P/F     [address], [place], [place] [address]    PLAN OF CARE and INTERVENTIONS:               1.  GOALS OF CARE/ ADVANCE CARE PLANNING:  Reviewed code status and AD.  Patient desires to remain in the home under the care of his wife.               2.  PATIENT/CAREGIVER EDUCATION:  Medication side effects.               4. PERSONAL EMERGENCY PLAN:  Family will activate 911 for emergencies.               5.  DISEASE STATUS: Greeted at the door by wife Stephen [name].  Patient is found in his recliner chair.  Patient engages in conversation easily.  Some forgetfulness noted but overall wife feels today is a good day with memory recall.  [facility] [place] [facility] for activities and stimulation.  Wife reports this has been good.  Patient typically returns home around 230 for naps.  Patient is taking naps daily.  He reports sleeping well at night with klonopin.   Wife notes bowels have changed slightly.  Patient was previously taking metamucil but this has stopped.  Patient is having softer stools and 1-2 bowel movements a day.  No new changes to diet and no new medications.  Reviewed side effects of current medications with wife.  Wife and patient report recent MD visits and note overall patient is doing well.  They will do follow ups in the next 6 months.  CT scan will be completed on Friday to follow up on a lung nodule.   Patient is using a cane for safe ambulation.  No falls have been reported.  Patient denies issues with chest pain, shortness of breath, dizziness or nausea/vomitting.  Re-enforced Palliative Care services with wife and patient.  Follow up will be completed in one month unless needed sooner.   HISTORY OF PRESENT  ILLNESS:  86 year old male with dementia and DM.  Patient is being followed by Palliative Care monthly and PRN.  CODE STATUS: DNR ADVANCED DIRECTIVES: Yes MOST FORM: No PPS: 50%   PHYSICAL EXAM:   VITALS: Today's Vitals   09/28/20 1622  BP: 106/78  Pulse: 77  Temp: 97.7 F (36.5 C)  SpO2: 96%  PainSc: 0-No pain    LUNGS: CTA occasional cough reported. CARDIAC: HRR EXTREMITIES: No edema SKIN: Warm and dry to touch.  No skin breakdown. NEURO: alert and oriented. + forgetfulness.       Lorenza Burton, RN

## 2020-09-30 ENCOUNTER — Other Ambulatory Visit: Payer: Self-pay

## 2020-09-30 ENCOUNTER — Ambulatory Visit
Admission: RE | Admit: 2020-09-30 | Discharge: 2020-09-30 | Disposition: A | Discharge status: Home / Self Care | Payer: Medicare Other | Source: Ambulatory Visit | Attending: Family Medicine | Admitting: Family Medicine

## 2020-09-30 DIAGNOSIS — J8410 Pulmonary fibrosis, unspecified: Secondary | ICD-10-CM | POA: Diagnosis not present

## 2020-09-30 DIAGNOSIS — R911 Solitary pulmonary nodule: Secondary | ICD-10-CM | POA: Insufficient documentation

## 2020-09-30 DIAGNOSIS — I2510 Atherosclerotic heart disease of native coronary artery without angina pectoris: Secondary | ICD-10-CM | POA: Diagnosis not present

## 2020-09-30 DIAGNOSIS — J439 Emphysema, unspecified: Secondary | ICD-10-CM | POA: Diagnosis not present

## 2020-09-30 DIAGNOSIS — K449 Diaphragmatic hernia without obstruction or gangrene: Secondary | ICD-10-CM | POA: Diagnosis not present

## 2020-10-01 ENCOUNTER — Other Ambulatory Visit: Payer: Self-pay | Admitting: Family Medicine

## 2020-10-03 NOTE — Telephone Encounter (Signed)
[facility] Last filled:  09/23/20, #180 Last OV:  09/12/20, AWV Next OV:  03/15/21, 6 mo f/u

## 2020-11-07 DIAGNOSIS — Z1152 Encounter for screening for COVID-19: Secondary | ICD-10-CM | POA: Diagnosis not present

## 2020-11-07 DIAGNOSIS — Z03818 Encounter for observation for suspected exposure to other biological agents ruled out: Secondary | ICD-10-CM | POA: Diagnosis not present

## 2020-11-08 ENCOUNTER — Telehealth: Payer: Self-pay

## 2020-11-08 NOTE — Telephone Encounter (Signed)
9:40AM: Palliative care SW outreached patient/family for monthly telephonic visit.  SW unable to LVM due to lines being busy. Will continue to offer palliative care support.

## 2020-11-08 NOTE — Telephone Encounter (Signed)
9:41AM: Palliative care SW outreached patient spouse for monthly telephonic visit.  SW left HIPPA complaint VM. Awaiting return call.  Will continue to offer palliative care support.

## 2020-11-12 ENCOUNTER — Other Ambulatory Visit: Payer: Self-pay | Admitting: Family Medicine

## 2020-11-23 DIAGNOSIS — G309 Alzheimer's disease, unspecified: Secondary | ICD-10-CM | POA: Diagnosis not present

## 2020-11-23 DIAGNOSIS — R2689 Other abnormalities of gait and mobility: Secondary | ICD-10-CM | POA: Diagnosis not present

## 2020-11-23 DIAGNOSIS — F0150 Vascular dementia without behavioral disturbance: Secondary | ICD-10-CM | POA: Diagnosis not present

## 2020-11-23 DIAGNOSIS — R2681 Unsteadiness on feet: Secondary | ICD-10-CM | POA: Diagnosis not present

## 2020-11-23 DIAGNOSIS — G4733 Obstructive sleep apnea (adult) (pediatric): Secondary | ICD-10-CM | POA: Diagnosis not present

## 2020-11-23 DIAGNOSIS — N183 Chronic kidney disease, stage 3 unspecified: Secondary | ICD-10-CM | POA: Diagnosis not present

## 2020-11-23 DIAGNOSIS — F0280 Dementia in other diseases classified elsewhere without behavioral disturbance: Secondary | ICD-10-CM | POA: Diagnosis not present

## 2020-11-23 DIAGNOSIS — E1122 Type 2 diabetes mellitus with diabetic chronic kidney disease: Secondary | ICD-10-CM | POA: Diagnosis not present

## 2020-11-23 DIAGNOSIS — I63512 Cerebral infarction due to unspecified occlusion or stenosis of left middle cerebral artery: Secondary | ICD-10-CM | POA: Diagnosis not present

## 2020-12-08 ENCOUNTER — Telehealth: Payer: Self-pay

## 2020-12-08 NOTE — Telephone Encounter (Signed)
1133 am.  Incoming call from [place].  [name] provides an update on patient's overall condition in the last 2 months.  She states they remained home at Christmas due to the rise in [place] cases.  Patient is going to [facility] program 2x weekly.  [name] feels this continues to be good for patient and provides stimulation and also a short respite for her.  COVID testing is being completed regularly at the [facility] and patient has been negative thus far.  [name] feels patient is declining.  She notes patient is displaying an increase in weakness and not as alert as he was last fall.  He remains ambulatory with the cane or walker and no falls have occurred.  He continues to feed himself and his appetite has remained good.  No visible signs of weight loss are noted.  Patient is requiring more instruction to complete tasks or he will ask wife what he needs to do next.  A scan was completed last year to follow up on lesion on the lung.  [name] notes it is slightly enlarged but no significant changes are noted.   [name] asked questions about hospice and states that she did not feel patient was ready for this yet but was thinking ahead.  I have advised that should patient have a further decline and she wish for an assessment to contact Palliative Care.    PLAN:  [name] will outreach Palliative Care team should patient display a further decline before our next follow up .

## 2020-12-08 NOTE — Telephone Encounter (Signed)
1028 am.  Telephone call made to Stephen [name] to complete a telephonic visit.  No answer but a message has been left on her VM requesting a call back.  PLAN:  Palliative Care will outreach patient again next month if no call back is received.

## 2020-12-23 ENCOUNTER — Ambulatory Visit: Payer: Medicare Other

## 2021-01-02 ENCOUNTER — Telehealth: Payer: Self-pay

## 2021-01-02 NOTE — Telephone Encounter (Signed)
1:30PM: Palliative care SW outreached patient/family for monthly telephonic visit.  SW left HIPPA complaint VM. Awaiting return call.  Will continue to offer palliative care support.

## 2021-01-06 ENCOUNTER — Other Ambulatory Visit: Payer: Self-pay | Admitting: Family Medicine

## 2021-01-11 ENCOUNTER — Other Ambulatory Visit: Payer: Medicare Other

## 2021-01-11 ENCOUNTER — Other Ambulatory Visit: Payer: Self-pay

## 2021-01-11 DIAGNOSIS — Z515 Encounter for palliative care: Secondary | ICD-10-CM | POA: Diagnosis not present

## 2021-01-11 NOTE — Progress Notes (Signed)
COMMUNITY PALLIATIVE CARE SW NOTE  PATIENT NAME: Stephen [name] DOB: Dec 27, 1926 MRN: [id number]  PRIMARY CARE PROVIDER: Ria Bush, MD  RESPONSIBLE PARTY:  Acct ID - Guarantor Home Phone Work Phone Relationship Acct Type  [id number] Stephen [name], WALN* [phone]  Self P/F     [address], [place], [place] [address]     PLAN OF CARE and INTERVENTIONS:             GOALS OF CARE/ ADVANCE CARE PLANNING: GOALS OF CARE/ ADVANCE CARE PLANNING:  Patient is a DNR and has living will on file. Patient's goal is to remain at home with wife and maintain quality of life.  2. SOCIAL/EMOTIONAL/SPIRITUAL ASSESSMENT/ INTERVENTIONS:  SW met with patient and patient's wife in the home for a monthly routine visit. Patient lives [place] [facility]. Patient has dementia.   Patient and wife provided updated on patients medical conditions/changes. Wife shared that she has noticed a gradual overall decline in patients physical abilities and memory. She is aware that this is part of the dementia process. However, patient is doing well on a day-to-day basis. SW educated wife on progression of disease to outreach palliative care should she notice a rapid change or decline.  Patient eats well and has gained a bit of weight, but nothing significant. Sleeps well. No recent falls reported.  No pain reported.  Reviewed medications. Patient is no longer taking metformin BID, provider titrated dosage down to once a day and cardiologist stopped aspirin. Wife shares that does not take patients BP daily but reports good/normal readings at each medical appointment. Patient recently saw neurology for routine checkup and will see PCP, dermatologist, and vein vascular within the next quarter for routine follow ups.  [facility] to [facility] at [employer] 2x/wk (Tue and Decker), patient shared that he enjoys going to the program. Wife shared that they go  for walks and take car rides on days [facility] is not [facility] [place] [facility].  SW discussed goals, reviewed care plan, provided emotional support, used active and reflective listening. Palliative care will continue to monitor and assist with long term care planning as needed.   3. PATIENT/CAREGIVER EDUCATION/ COPING:  Patient A&O. Patient does have dementia and able to answer simple questions buts depends on wife to fill in gaps and be historian. Patient and wife denies any anxiety or depression. Patients family is supportive.   4. PERSONAL EMERGENCY PLAN:  wife will call 9-1-1 for emergencies.   5. COMMUNITY RESOURCES COORDINATION/ HEALTH CARE NAVIGATION:  wife manages her care.  6. FINANCIAL/LEGAL CONCERNS/INTERVENTIONS:  None.     SOCIAL HX:  Social History   Tobacco Use  . Smoking status: Former Smoker    Types: Cigarettes    Quit date: 11/12/1962    Years since quitting: 58.2  . Smokeless tobacco: Never Used  . Tobacco comment: Quit 1964  Substance Use Topics  . Alcohol use: Not Currently    Alcohol/week: 0.0 standard drinks    Comment:  none since stroke    CODE STATUS: DNR  ADVANCED DIRECTIVES: Y MOST FORM COMPLETE:  N HOSPICE EDUCATION PROVIDED: N  IBB:[id number] is ambulatory with SPC and RW at HS. Patient is ale to toilet independently. Requires MINA with bathing and dressing. Able to feedself. Wife does meal prep and house work.   Time spent: 40 min       [place] Henrene Pastor, [place]

## 2021-01-16 DIAGNOSIS — C4442 Squamous cell carcinoma of skin of scalp and neck: Secondary | ICD-10-CM | POA: Diagnosis not present

## 2021-01-16 DIAGNOSIS — X32XXXA Exposure to sunlight, initial encounter: Secondary | ICD-10-CM | POA: Diagnosis not present

## 2021-01-16 DIAGNOSIS — L570 Actinic keratosis: Secondary | ICD-10-CM | POA: Diagnosis not present

## 2021-02-08 DIAGNOSIS — E119 Type 2 diabetes mellitus without complications: Secondary | ICD-10-CM | POA: Diagnosis not present

## 2021-02-08 LAB — HM DIABETES EYE EXAM

## 2021-02-10 ENCOUNTER — Encounter: Payer: Self-pay | Admitting: Family Medicine

## 2021-02-12 ENCOUNTER — Other Ambulatory Visit: Payer: Self-pay | Admitting: Family Medicine

## 2021-02-15 ENCOUNTER — Other Ambulatory Visit: Payer: Self-pay | Admitting: Family Medicine

## 2021-02-27 DIAGNOSIS — C4442 Squamous cell carcinoma of skin of scalp and neck: Secondary | ICD-10-CM | POA: Diagnosis not present

## 2021-02-28 ENCOUNTER — Telehealth: Payer: Self-pay

## 2021-02-28 ENCOUNTER — Other Ambulatory Visit: Payer: Self-pay | Admitting: Family Medicine

## 2021-02-28 NOTE — Telephone Encounter (Signed)
11:30AM: Palliative care SW outreached patient/family for monthly telephonic visit and to schedule in person visit.  Call unsuccessful. SW left HIPPA compliant VM.  Awaiting return call. Will outreach again at later date and continue to offer palliative care support.

## 2021-02-28 NOTE — Telephone Encounter (Signed)
Refill request Clonazepam Last refill 09/12/20 #30/5 Last office visit 09/12/20 Upcoming appointment 03/15/21

## 2021-03-01 NOTE — Telephone Encounter (Signed)
ERx 

## 2021-03-03 ENCOUNTER — Telehealth: Payer: Self-pay

## 2021-03-03 NOTE — Telephone Encounter (Signed)
03/03/21 @1 :25 pm: palliative care SW received call from patients spouse, Stephen [name].  Stephen [name] shared that patient is stable, she has noticed minor changes in patients cognition, but nothing drastic. /overall patient is the same as last in person visit.   In person visit scheduled with spouse for 5/11 @10am  with [facility] team.

## 2021-03-12 ENCOUNTER — Encounter: Payer: Self-pay | Admitting: Family Medicine

## 2021-03-15 ENCOUNTER — Ambulatory Visit (INDEPENDENT_AMBULATORY_CARE_PROVIDER_SITE_OTHER): Payer: Medicare Other | Admitting: Family Medicine

## 2021-03-15 ENCOUNTER — Encounter: Payer: Self-pay | Admitting: Family Medicine

## 2021-03-15 ENCOUNTER — Other Ambulatory Visit: Payer: Self-pay

## 2021-03-15 VITALS — BP 116/70 | HR 92 | Temp 97.5°F | Ht 68.0 in | Wt 189.5 lb

## 2021-03-15 DIAGNOSIS — N1831 Chronic kidney disease, stage 3a: Secondary | ICD-10-CM | POA: Diagnosis not present

## 2021-03-15 DIAGNOSIS — Z66 Do not resuscitate: Secondary | ICD-10-CM

## 2021-03-15 DIAGNOSIS — F0150 Vascular dementia without behavioral disturbance: Secondary | ICD-10-CM | POA: Diagnosis not present

## 2021-03-15 DIAGNOSIS — G4731 Primary central sleep apnea: Secondary | ICD-10-CM

## 2021-03-15 DIAGNOSIS — Z8673 Personal history of transient ischemic attack (TIA), and cerebral infarction without residual deficits: Secondary | ICD-10-CM

## 2021-03-15 DIAGNOSIS — G309 Alzheimer's disease, unspecified: Secondary | ICD-10-CM | POA: Diagnosis not present

## 2021-03-15 DIAGNOSIS — G4739 Other sleep apnea: Secondary | ICD-10-CM

## 2021-03-15 DIAGNOSIS — E1122 Type 2 diabetes mellitus with diabetic chronic kidney disease: Secondary | ICD-10-CM

## 2021-03-15 DIAGNOSIS — E1169 Type 2 diabetes mellitus with other specified complication: Secondary | ICD-10-CM

## 2021-03-15 DIAGNOSIS — I63239 Cerebral infarction due to unspecified occlusion or stenosis of unspecified carotid arteries: Secondary | ICD-10-CM | POA: Diagnosis not present

## 2021-03-15 DIAGNOSIS — R011 Cardiac murmur, unspecified: Secondary | ICD-10-CM | POA: Diagnosis not present

## 2021-03-15 DIAGNOSIS — F05 Delirium due to known physiological condition: Secondary | ICD-10-CM

## 2021-03-15 DIAGNOSIS — N183 Chronic kidney disease, stage 3 unspecified: Secondary | ICD-10-CM

## 2021-03-15 DIAGNOSIS — J8410 Pulmonary fibrosis, unspecified: Secondary | ICD-10-CM

## 2021-03-15 DIAGNOSIS — K449 Diaphragmatic hernia without obstruction or gangrene: Secondary | ICD-10-CM | POA: Diagnosis not present

## 2021-03-15 DIAGNOSIS — R911 Solitary pulmonary nodule: Secondary | ICD-10-CM

## 2021-03-15 DIAGNOSIS — I2510 Atherosclerotic heart disease of native coronary artery without angina pectoris: Secondary | ICD-10-CM | POA: Diagnosis not present

## 2021-03-15 DIAGNOSIS — E785 Hyperlipidemia, unspecified: Secondary | ICD-10-CM

## 2021-03-15 DIAGNOSIS — F0280 Dementia in other diseases classified elsewhere without behavioral disturbance: Secondary | ICD-10-CM | POA: Diagnosis not present

## 2021-03-15 LAB — POCT GLYCOSYLATED HEMOGLOBIN (HGB A1C): Hemoglobin A1C: 6.9 % — AB (ref 4.0–5.6)

## 2021-03-15 NOTE — Patient Instructions (Signed)
Return as needed or in 6 months for next wellness visit.  Continue current medicines Good to see you today

## 2021-03-15 NOTE — Progress Notes (Signed)
Patient ID: Stephen [name], male    DOB: 03-31-1927, 87 y.o.   MRN: [id number]  This visit was conducted in person.  BP 116/70   Pulse 92   Temp (!) 97.5 F (36.4 C) (Temporal)   Ht 5\' 8"  (1.727 m)   Wt 189 lb 8 oz (86 kg)   SpO2 98%   BMI 28.81 kg/m    CC: 6 mo dementia f/u visit  Subjective:   HPI: Stephen [name] is a 87 y.o. male presenting on 03/15/2021 for Follow-up (Here for 6 mo f/u.  Pt accompanied by significant other, [name]- temp 98.1.) and Form Completion (Per [name], pt is not aware [please DO NOT discuss w/ pt] he is possibly being moved to [facility].  Needs FL2 form completed. )   Overall feeling well, denies chest pain, dyspnea or cough.  Has been complaining of some R lower back discomfort in the mornings. Partner has treated with voltaren gel with benefit.   Needs FL2 form for possible pending move to memory care unit at [facility]. Patient is still unaware of pending move.   Mixed vascular/alz dementia - sees neurologist Manuella Ghazi), stable period on abilify 2mg  daily, namenda 10mg  bid. Palliative care involved.   H/o prior strokes continues plavix and pravastatin. Aspirin stopped by cards.   OSA - off CPAP for the past few years - no longer snoring.  RLS - continues clonazepam 1mg  nightly   Saw Dr Miachel Roux oncology 09/2019 - for concerning suspicious hypermetabolic LUL nodule and increased metabolic activity of distal esophageal wall. EGD was reassuring only showing large hiatal hernia. Case reviewed at tumor board - rec palliative care only, with option to see rad onc for radiation to the lung nodulein future if desired. Established with [facility] palliative care 09/2019. F/u CT chest 09/2020 showed stable CAD, tortuous thoracic aorta with calcifications, large HH, and enlarging LUL nodule measuring 2.3x1.7cm.   DM - well controlled on low dose metformin - continue this.      Relevant past medical, surgical, family and social  history reviewed and updated as indicated. Interim medical history since our last visit reviewed. Allergies and medications reviewed and updated. Outpatient Medications Prior to Visit  Medication Sig Dispense Refill  . acetaminophen (TYLENOL) 500 MG tablet Take 1 tablet (500 mg total) by mouth 2 (two) times daily. (Patient taking differently: Take 1,000 mg by mouth 2 (two) times daily. Takes 2 tablets twice a day) 30 tablet 0  . ARIPiprazole (ABILIFY) 2 MG tablet Take 1 tablet by mouth daily.    . clopidogrel (PLAVIX) 75 MG tablet TAKE 1 TABLET (75 MG TOTAL) BY MOUTH AT BEDTIME. 90 tablet 1  . Docusate Sodium (COLACE PO) Take by mouth daily.    . finasteride (PROSCAR) 5 MG tablet TAKE 1 TABLET BY MOUTH EVERY DAY 90 tablet 2  . memantine (NAMENDA) 10 MG tablet TAKE 1 TABLET BY MOUTH TWICE A DAY 180 tablet 2  . metFORMIN (GLUCOPHAGE) 500 MG tablet TAKE 1 TABLET BY MOUTH EVERY DAY WITH BREAKFAST 90 tablet 3  . Multiple Vitamin (MULTIVITAMIN WITH MINERALS) TABS tablet Take 1 tablet by mouth daily.    . pantoprazole (PROTONIX) 40 MG tablet TAKE 1 TABLET BY MOUTH TWICE A DAY 180 tablet 1  . pravastatin (PRAVACHOL) 40 MG tablet TAKE 1 TABLET BY MOUTH EVERY DAY 90 tablet 1  . vitamin B-12 (CYANOCOBALAMIN) 500 MCG tablet Take 500 mcg by mouth daily.    Marland Kitchen  clonazePAM (KLONOPIN) 1 MG tablet TAKE 0.5-1 TABLETS (0.5-1 MG TOTAL) BY MOUTH AT BEDTIME. 30 tablet 1  . clonazePAM (KLONOPIN) 1 MG tablet Take 1 tablet (1 mg total) by mouth at bedtime. 30 tablet 0  . metroNIDAZOLE (METROGEL) 1 % gel Apply 1 application topically daily.     No facility-administered medications prior to visit.     Per HPI unless specifically indicated in ROS section below Review of Systems Objective:  BP 116/70   Pulse 92   Temp (!) 97.5 F (36.4 C) (Temporal)   Ht 5\' 8"  (1.727 m)   Wt 189 lb 8 oz (86 kg)   SpO2 98%   BMI 28.81 kg/m   Wt Readings from Last 3 Encounters:  03/15/21 189 lb 8 oz (86 kg)  09/20/20 187 lb 8 oz  (85 kg)  09/12/20 186 lb 6 oz (84.5 kg)      Physical Exam Vitals and nursing note reviewed.  Constitutional:      Appearance: Normal appearance. He is not ill-appearing.     Comments: Walks with walker  Eyes:     Extraocular Movements: Extraocular movements intact.     Pupils: Pupils are equal, round, and reactive to light.  Cardiovascular:     Rate and Rhythm: Normal rate and regular rhythm.     Pulses: Normal pulses.     Heart sounds: Murmur (2/6 systolic at USB) heard.    Pulmonary:     Effort: Pulmonary effort is normal. No respiratory distress.     Breath sounds: Normal breath sounds. No wheezing, rhonchi or rales.  Musculoskeletal:        General: Normal range of motion.     Right lower leg: No edema.     Left lower leg: No edema.     Comments: No reproducible midline or R lower back pain   Skin:    General: Skin is warm and dry.     Findings: No rash.  Neurological:     Mental Status: He is alert.  Psychiatric:        Mood and Affect: Mood normal.        Behavior: Behavior normal.       Results for orders placed or performed in visit on 03/15/21  POCT glycosylated hemoglobin (Hb A1C)  Result Value Ref Range   Hemoglobin A1C 6.9 (A) 4.0 - 5.6 %   HbA1c POC (<> result, manual entry)     HbA1c, POC (prediabetic range)     HbA1c, POC (controlled diabetic range)     Lab Results  Component Value Date   CREATININE 1.24 09/07/2020   BUN 16 09/07/2020   NA 137 09/07/2020   K 4.4 09/07/2020   CL 102 09/07/2020   CO2 26 09/07/2020    Lab Results  Component Value Date   CHOL 122 09/07/2020   HDL 50.30 09/07/2020   LDLCALC 36 09/07/2020   TRIG 180.0 (H) 09/07/2020   CHOLHDL 2 09/07/2020    Assessment & Plan:  This visit occurred during the SARS-CoV-2 public health emergency.  Safety protocols were in place, including screening questions prior to the visit, additional usage of staff PPE, and extensive cleaning of exam room while observing appropriate contact  time as indicated for disinfecting solutions.   Problem List Items Addressed This Visit    Controlled type 2 diabetes mellitus with stage 3 chronic kidney disease, without long-term current use of insulin (HCC)    Chronic, stable on low dose metformin - continue.  Relevant Orders   POCT glycosylated hemoglobin (Hb A1C) (Completed)   Hyperlipidemia associated with type 2 diabetes mellitus (HCC)    Chronic, stable on pravastatin - continue. The ASCVD Risk score Mikey Bussing DC Jr., et al., 2013) failed to calculate for the following reasons:   The 2013 ASCVD risk score is only valid for ages 98 to 78   The patient has a prior MI or stroke diagnosis       Coronary artery disease    S/p angioplasty with DES to mid RCA (2015)       Pulmonary interstitial fibrosis ([place])    Found on prior CT - pt asxs.  No mention of fibrosis on latest CT 2021      Hiatal hernia   Mixed Alzheimer's and vascular dementia ([place]) - Primary    Chronic, stable period on namenda 10mg  bid and abilify 2mg  daily. Sees neurology Manuella Ghazi). Palliative care involved, now pending move to memory unit at [facility]. SO [name] requests FL2 form filled out.       Relevant Medications   clonazePAM (KLONOPIN) 1 MG tablet   Complex sleep apnea syndrome    By sleep study, has seen Dr Brett Fairy. Stable period on klonopin nightly.       Systolic murmur    Mild. Continue to monitor for symptoms.       History of ischemic left MCA stroke    Continue plavix, statin. Now off statin (stopped by cardiology).       Carotid stenosis, symptomatic, with infarction ([place])    Continues seeing [facility] ([place])       DNR (do not resuscitate)    Previously confirmed.       CKD (chronic kidney disease) stage 3, GFR 30-59 ml/min (HCC)   Sundowning    Continue abilify       Pulmonary nodule    Reviewed latest CT showing slowly enlarging LUL pulm nodule. Discussed rad onc eval (would consider radiation therapy prn symptoms but not chemo or  surgery). Will just continue to monitor for new symptoms at this time.           No orders of the defined types were placed in this encounter.  Orders Placed This Encounter  Procedures  . POCT glycosylated hemoglobin (Hb A1C)    Patient Instructions  Return as needed or in 6 months for next wellness visit.  Continue current medicines Good to see you today   Follow up plan: Return in about 6 months (around 09/15/2021) for medicare wellness visit.  Ria Bush, MD

## 2021-03-16 NOTE — Assessment & Plan Note (Signed)
S/p angioplasty with DES to mid RCA (2015)

## 2021-03-16 NOTE — Assessment & Plan Note (Addendum)
Found on prior CT - pt asxs.  No mention of fibrosis on latest CT 2021

## 2021-03-16 NOTE — Assessment & Plan Note (Signed)
Chronic, stable on low dose metformin - continue. 

## 2021-03-16 NOTE — Assessment & Plan Note (Addendum)
Reviewed latest CT showing slowly enlarging LUL pulm nodule. Discussed rad onc eval (would consider radiation therapy prn symptoms but not chemo or surgery). Will just continue to monitor for new symptoms at this time.

## 2021-03-16 NOTE — Assessment & Plan Note (Signed)
Mild. Continue to monitor for symptoms.

## 2021-03-16 NOTE — Assessment & Plan Note (Signed)
Continues seeing VVS ([place])

## 2021-03-16 NOTE — Assessment & Plan Note (Signed)
Previously confirmed.

## 2021-03-16 NOTE — Assessment & Plan Note (Signed)
Continue abilify

## 2021-03-16 NOTE — Assessment & Plan Note (Signed)
By sleep study, has seen Dr Brett Fairy. Stable period on klonopin nightly.

## 2021-03-16 NOTE — Assessment & Plan Note (Signed)
Chronic, stable on pravastatin - continue. The ASCVD Risk score Mikey Bussing DC Jr., et al., 2013) failed to calculate for the following reasons:   The 2013 ASCVD risk score is only valid for ages 45 to 95   The patient has a prior MI or stroke diagnosis

## 2021-03-16 NOTE — Assessment & Plan Note (Addendum)
Chronic, stable period on namenda 10mg  bid and abilify 2mg  daily. Sees neurology Manuella Ghazi). Palliative care involved, now pending possible move to [facility] memory care unit at [facility] SO [name] requests FL2 form filled out.

## 2021-03-16 NOTE — Assessment & Plan Note (Signed)
Continue plavix, statin. Now off statin (stopped by cardiology).

## 2021-03-22 ENCOUNTER — Other Ambulatory Visit: Payer: Self-pay

## 2021-03-22 ENCOUNTER — Other Ambulatory Visit: Payer: Medicare Other

## 2021-03-22 VITALS — BP 94/56 | HR 83 | Temp 97.8°F

## 2021-03-22 DIAGNOSIS — Z515 Encounter for palliative care: Secondary | ICD-10-CM

## 2021-03-22 NOTE — Progress Notes (Signed)
PATIENT NAME: Stephen [name] DOB: 1927-09-07 MRN: [id number]  PRIMARY CARE PROVIDER: Ria Bush, MD  RESPONSIBLE PARTY:  Acct ID - Guarantor Home Phone Work Phone Relationship Acct Type  [id number] MAGNUM, LUNDE* [phone]  Self P/F     [address], [place], [place] [address]    PLAN OF CARE and INTERVENTIONS:               1.  GOALS OF CARE/ ADVANCE CARE PLANNING:  Patient will be moving to [facility] by the end of the month.               2.  PATIENT/CAREGIVER EDUCATION:  Hypotension and increase in fluid intake.                4. PERSONAL EMERGENCY PLAN:  Activate 911 for emergencies.               5.  DISEASE STATUS:  Joint visit with [place], SW, wife [name] and patient.  Wife reports a fall from the chair recently without injury.  Patient's life chair was still reclined and he apparently got up without assistance.  No further issues are reported and a new lift chair has been ordered.  Patient has progressed from a cane to a rolling walker since my last visit.  Wife also reports obtaining a rollator walker for patient so he can sit when they are out walking.  No changes with po intake are reported.  Wife reports patient is eating everything she cooks for him.   Reviewed medications and there are no new changes.  Wife reports giving an extra colace if constipation is an issue.  Patient's bp lower today than normal.  Encouraged wife to push more fluids as his fluid intake has not been exceptionally good.  Wife rechecked with automatic bp cuff and reading is 104/66.  Patient is scheduled to move to [facility].  Patient is not aware of this move but wife will further discuss with patient later.   Wife notes care has become more challenging.  Her family and patient's family recognized this and were able to encourage wife to make the move to memory care.   Patient continues to take naps daily.  He is participating in  [employer] program 2 x weekly.    HISTORY OF PRESENT ILLNESS:  87 year old male with Dementia.  Patient is being followed by Palliative Care monthly and PRN.  CODE STATUS: DNR ADVANCED DIRECTIVES: Yes MOST FORM: No PPS: weak 50%   PHYSICAL EXAM:   VITALS: Today's Vitals   03/22/21 1014  BP: (!) 94/56  Pulse: 83  Temp: 97.8 F (36.6 C)  SpO2: 96%    LUNGS: CTA CARDIAC:  HRR EXTREMITIES: - for edema SKIN: Warm and dry to touch.  No skin breakdown reported. NEURO:  Alert to self and wife. Hearing loss present.      Lorenza Burton, RN

## 2021-03-22 NOTE — Progress Notes (Signed)
COMMUNITY PALLIATIVE CARE SW NOTE  PATIENT NAME: Stephen [name] DOB: 1927-06-16 MRN: [id number]  PRIMARY CARE PROVIDER: Ria Bush, MD  RESPONSIBLE PARTY:  Acct ID - Guarantor Home Phone Work Phone Relationship Acct Type  [id number] Stephen [name], Stephen [name]* [phone]  Self P/F     [address], [place], [place] [address]     PLAN OF CARE and INTERVENTIONS:             1. GOALS OF CARE/ ADVANCE CARE PLANNING:  Patient is a DNR and has living will on file. Patient's goal is to remain at home with wife and maintain quality of life.   2.         SOCIAL/EMOTIONAL/SPIRITUAL ASSESSMENT/ INTERVENTIONS:  SW met with patient and patient's wife in the home for a monthly routine visit. Patient [place]. Patient has dementia.    Patient and wife provided updated on patients medical conditions/changes. Wife shared that she has noticed a gradual overall decline in patients physical abilities and memory. Patient's mobility has decreased and slowed over time. She is aware that this is part of the dementia process. However, patient is doing well on a day-to-day basis. SW educated wife on progression of disease to outreach palliative care should she notice a rapid change or decline.   Patient eats well and has gained a bit of weight, current weight is 189lb. Sleeps well and naps during the day. Patient had a fall nearly 3 weeks ago out of recliner chair. Wife has purchased a lift chair for patient, recliner delivered today during visit.  No pain reported.   RN reviewed medications and vitals. BP reading is low during visit. RN encouraged fluid intake and recheck vitals at later time.    Patient [place] to [place] at [employer] 2x/wk (Tue and [place]), patient shared that he enjoys going to the program. Wife shared that they go for walks and take car rides on days [place] not [place].  Wife has started process of  placement into [facility] memory [facility] SNF by the end of the month. PCP has completed FL2 for transition. Support offered to wife. Wife shares that the process and transition of patient is emotional for her, however she has a group of ladies who shares the experience as well and able to sound support for wife.   SW discussed goals, reviewed care plan, provided emotional support, used active and reflective listening. Palliative care will continue to monitor and assist with long term care planning as needed.    3.         PATIENT/CAREGIVER EDUCATION/ COPING:  Patient A&O. Patient does have dementia and able to answer simple questions buts depends on wife to fill in gaps and be historian. Patient and wife denies any anxiety or depression. Patients family is supportive.    4.         PERSONAL EMERGENCY PLAN:  wife will call 9-1-1 for emergencies.    5.         COMMUNITY RESOURCES COORDINATION/ HEALTH CARE NAVIGATION:  wife manages her care.   6.                         FINANCIAL/LEGAL CONCERNS/INTERVENTIONS:  None.     SOCIAL HX:  Social History   Tobacco Use  . Smoking status: Former Smoker    Types: Cigarettes    Quit  date: 11/12/1962    Years since quitting: 58.3  . Smokeless tobacco: Never Used  . Tobacco comment: Quit 1964  Substance Use Topics  . Alcohol use: Not Currently    Alcohol/week: 0.0 standard drinks    Comment:  none since stroke    CODE STATUS: DNR  ADVANCED DIRECTIVES: Y MOST FORM COMPLETE:  N HOSPICE EDUCATION PROVIDED: N  PPS: patient is MIN-MOD A with ADL's.    Time spent: 45 min       Blair, Edgar

## 2021-04-21 ENCOUNTER — Encounter (INDEPENDENT_AMBULATORY_CARE_PROVIDER_SITE_OTHER): Payer: Medicare Other

## 2021-04-21 ENCOUNTER — Ambulatory Visit (INDEPENDENT_AMBULATORY_CARE_PROVIDER_SITE_OTHER): Payer: Medicare Other | Admitting: Vascular Surgery

## 2021-05-02 ENCOUNTER — Telehealth: Payer: Self-pay

## 2021-05-02 NOTE — Telephone Encounter (Signed)
Received a call from [place] at [facility], they are needing a Bath for the patient and asked for it to be faxed to [phone].

## 2021-05-02 NOTE — Telephone Encounter (Signed)
Lvm asking Stephen [name] to call back.  Need to know what [place] stands for.

## 2021-05-02 NOTE — Telephone Encounter (Signed)
What's HMP?

## 2021-05-03 NOTE — Telephone Encounter (Signed)
Spoke with [facility] asking about "hmp".  States they need H&P to have in pt's file.    Printed 09/12/20 AWV and faxed to [facility] at [phone].

## 2021-05-09 ENCOUNTER — Telehealth: Payer: Medicare Other

## 2021-05-09 ENCOUNTER — Other Ambulatory Visit: Payer: Self-pay

## 2021-05-09 DIAGNOSIS — Z515 Encounter for palliative care: Secondary | ICD-10-CM

## 2021-05-09 NOTE — Progress Notes (Signed)
FOLLOW UP PALLIATIVE CARE CONSULT/SOCIAL WORK VISIT NOTE  PATIENT NAME: STEPHFON BOVEY DOB: [birth year] MRN: [id number]  REFERRING PROVIDER: Ria Bush, MD [place],  [place] [address]   RESPONSIBLE PARTY:  Acct ID - Guarantor Home Phone Work Phone Relationship Acct Type  [id number] ESAIAS, [phone]  Self P/F     [address] [place], [place] [address]     Palliative care SW outreached patient to complete telephonic visit.   Palliative care SW outreached patient to complete telephonic visit. Spouse shares that as of 04/13/21 patient has transitioned to [facility] memory care [address]. Spouse states that patient continues to have episodes of questioning why he is there and not at home. Both patient and spouse are battling adjustment issues from the transition. Patient is easily redirected when he becomes a bit agitated about being in SNF. Spouse states that overall the transition has been easy and good for patient. Patient continue to be ambulatory with RW and able to go on short walks in the garden with spouse.   Spouse shares that she continues to have a great support circle and that she has been able to focus on her own health issues lately which has been a relief for her.   SW advised that Palliative care NP can continue to follow patient in SNF setting, however SW and RN will not be able to. Spouse stated understanding and appreciative of call and support.   CODE STATUS: DNR  ADVANCED DIRECTIVES: Denese Killings, LCSW

## 2021-05-17 DIAGNOSIS — N1831 Chronic kidney disease, stage 3a: Secondary | ICD-10-CM | POA: Diagnosis not present

## 2021-05-17 DIAGNOSIS — N400 Enlarged prostate without lower urinary tract symptoms: Secondary | ICD-10-CM | POA: Diagnosis not present

## 2021-05-17 DIAGNOSIS — G301 Alzheimer's disease with late onset: Secondary | ICD-10-CM | POA: Diagnosis not present

## 2021-05-17 DIAGNOSIS — G479 Sleep disorder, unspecified: Secondary | ICD-10-CM | POA: Diagnosis not present

## 2021-05-17 DIAGNOSIS — Z8673 Personal history of transient ischemic attack (TIA), and cerebral infarction without residual deficits: Secondary | ICD-10-CM | POA: Diagnosis not present

## 2021-05-17 DIAGNOSIS — I6529 Occlusion and stenosis of unspecified carotid artery: Secondary | ICD-10-CM | POA: Diagnosis not present

## 2021-05-17 DIAGNOSIS — K219 Gastro-esophageal reflux disease without esophagitis: Secondary | ICD-10-CM | POA: Diagnosis not present

## 2021-05-17 DIAGNOSIS — E1159 Type 2 diabetes mellitus with other circulatory complications: Secondary | ICD-10-CM | POA: Diagnosis not present

## 2021-05-17 DIAGNOSIS — F0150 Vascular dementia without behavioral disturbance: Secondary | ICD-10-CM | POA: Diagnosis not present

## 2021-05-18 ENCOUNTER — Encounter: Payer: Self-pay | Admitting: Family Medicine

## 2021-07-05 DIAGNOSIS — L718 Other rosacea: Secondary | ICD-10-CM | POA: Diagnosis not present

## 2021-07-05 DIAGNOSIS — Z85828 Personal history of other malignant neoplasm of skin: Secondary | ICD-10-CM | POA: Diagnosis not present

## 2021-07-05 DIAGNOSIS — L821 Other seborrheic keratosis: Secondary | ICD-10-CM | POA: Diagnosis not present

## 2021-07-05 DIAGNOSIS — L570 Actinic keratosis: Secondary | ICD-10-CM | POA: Diagnosis not present

## 2021-07-06 DIAGNOSIS — Z8673 Personal history of transient ischemic attack (TIA), and cerebral infarction without residual deficits: Secondary | ICD-10-CM | POA: Diagnosis not present

## 2021-07-06 DIAGNOSIS — I6529 Occlusion and stenosis of unspecified carotid artery: Secondary | ICD-10-CM | POA: Diagnosis not present

## 2021-07-06 DIAGNOSIS — F0150 Vascular dementia without behavioral disturbance: Secondary | ICD-10-CM | POA: Diagnosis not present

## 2021-07-06 DIAGNOSIS — K219 Gastro-esophageal reflux disease without esophagitis: Secondary | ICD-10-CM | POA: Diagnosis not present

## 2021-07-06 DIAGNOSIS — E1159 Type 2 diabetes mellitus with other circulatory complications: Secondary | ICD-10-CM | POA: Diagnosis not present

## 2021-07-06 DIAGNOSIS — G301 Alzheimer's disease with late onset: Secondary | ICD-10-CM | POA: Diagnosis not present

## 2021-08-03 DIAGNOSIS — Z23 Encounter for immunization: Secondary | ICD-10-CM | POA: Diagnosis not present

## 2021-08-09 DIAGNOSIS — M542 Cervicalgia: Secondary | ICD-10-CM | POA: Diagnosis not present

## 2021-08-16 DIAGNOSIS — N183 Chronic kidney disease, stage 3 unspecified: Secondary | ICD-10-CM | POA: Diagnosis not present

## 2021-08-16 DIAGNOSIS — G479 Sleep disorder, unspecified: Secondary | ICD-10-CM | POA: Diagnosis not present

## 2021-08-16 DIAGNOSIS — E1159 Type 2 diabetes mellitus with other circulatory complications: Secondary | ICD-10-CM | POA: Diagnosis not present

## 2021-08-16 DIAGNOSIS — Z8673 Personal history of transient ischemic attack (TIA), and cerebral infarction without residual deficits: Secondary | ICD-10-CM | POA: Diagnosis not present

## 2021-08-16 DIAGNOSIS — K219 Gastro-esophageal reflux disease without esophagitis: Secondary | ICD-10-CM | POA: Diagnosis not present

## 2021-08-16 DIAGNOSIS — G3183 Dementia with Lewy bodies: Secondary | ICD-10-CM | POA: Diagnosis not present

## 2021-08-16 DIAGNOSIS — N401 Enlarged prostate with lower urinary tract symptoms: Secondary | ICD-10-CM | POA: Diagnosis not present

## 2021-08-18 DIAGNOSIS — L718 Other rosacea: Secondary | ICD-10-CM | POA: Diagnosis not present

## 2021-08-18 DIAGNOSIS — L570 Actinic keratosis: Secondary | ICD-10-CM | POA: Diagnosis not present

## 2021-09-08 DIAGNOSIS — L570 Actinic keratosis: Secondary | ICD-10-CM | POA: Diagnosis not present

## 2021-09-08 DIAGNOSIS — L821 Other seborrheic keratosis: Secondary | ICD-10-CM | POA: Diagnosis not present

## 2021-09-14 DIAGNOSIS — E1159 Type 2 diabetes mellitus with other circulatory complications: Secondary | ICD-10-CM | POA: Diagnosis not present

## 2021-09-15 ENCOUNTER — Other Ambulatory Visit: Payer: Medicare Other

## 2021-09-15 ENCOUNTER — Ambulatory Visit: Payer: Medicare Other

## 2021-09-15 LAB — HEPATIC FUNCTION PANEL
ALT: 7 U/L — AB (ref 10–40)
AST: 15 (ref 14–40)
Alkaline Phosphatase: 54 (ref 25–125)
Bilirubin, Total: 0.3

## 2021-09-15 LAB — CBC AND DIFFERENTIAL
HCT: 32 — AB (ref 41–53)
Hemoglobin: 10.2 — AB (ref 13.5–17.5)
Neutrophils Absolute: 5535
Platelets: 326 10*3/uL (ref 150–400)
WBC: 8.8

## 2021-09-15 LAB — COMPREHENSIVE METABOLIC PANEL
Albumin: 3.3 — AB (ref 3.5–5.0)
Calcium: 8.8 (ref 8.7–10.7)
Globulin: 3
eGFR: 69

## 2021-09-15 LAB — LIPID PANEL
Cholesterol: 120 (ref 0–200)
HDL: 48 (ref 35–70)
LDL Cholesterol: 48
Triglycerides: 154 (ref 40–160)

## 2021-09-15 LAB — BASIC METABOLIC PANEL
BUN: 12 (ref 4–21)
CO2: 26 — AB (ref 13–22)
Chloride: 102 (ref 99–108)
Creatinine: 1 (ref 0.6–1.3)
Glucose: 115
Potassium: 4.5 mEq/L (ref 3.5–5.1)
Sodium: 136 — AB (ref 137–147)

## 2021-09-15 LAB — CBC: RBC: 3.97 (ref 3.87–5.11)

## 2021-09-15 LAB — VITAMIN B12: Vitamin B-12: 554

## 2021-09-15 LAB — HEMOGLOBIN A1C: Hemoglobin A1C: 7.9

## 2021-09-20 ENCOUNTER — Encounter: Payer: Medicare Other | Admitting: Family Medicine

## 2021-09-29 DIAGNOSIS — L570 Actinic keratosis: Secondary | ICD-10-CM | POA: Diagnosis not present

## 2021-10-11 DIAGNOSIS — B351 Tinea unguium: Secondary | ICD-10-CM | POA: Diagnosis not present

## 2021-10-23 DIAGNOSIS — F0150 Vascular dementia without behavioral disturbance: Secondary | ICD-10-CM | POA: Diagnosis not present

## 2021-10-23 DIAGNOSIS — N400 Enlarged prostate without lower urinary tract symptoms: Secondary | ICD-10-CM | POA: Diagnosis not present

## 2021-10-23 DIAGNOSIS — G301 Alzheimer's disease with late onset: Secondary | ICD-10-CM | POA: Diagnosis not present

## 2021-10-23 DIAGNOSIS — E1159 Type 2 diabetes mellitus with other circulatory complications: Secondary | ICD-10-CM | POA: Diagnosis not present

## 2021-10-23 DIAGNOSIS — Z8673 Personal history of transient ischemic attack (TIA), and cerebral infarction without residual deficits: Secondary | ICD-10-CM | POA: Diagnosis not present

## 2021-10-23 DIAGNOSIS — K219 Gastro-esophageal reflux disease without esophagitis: Secondary | ICD-10-CM | POA: Diagnosis not present

## 2021-11-10 DIAGNOSIS — L821 Other seborrheic keratosis: Secondary | ICD-10-CM | POA: Diagnosis not present

## 2021-11-10 DIAGNOSIS — L905 Scar conditions and fibrosis of skin: Secondary | ICD-10-CM | POA: Diagnosis not present

## 2021-11-20 DIAGNOSIS — Z20822 Contact with and (suspected) exposure to covid-19: Secondary | ICD-10-CM | POA: Diagnosis not present

## 2021-12-27 DIAGNOSIS — N183 Chronic kidney disease, stage 3 unspecified: Secondary | ICD-10-CM | POA: Diagnosis not present

## 2021-12-27 DIAGNOSIS — K219 Gastro-esophageal reflux disease without esophagitis: Secondary | ICD-10-CM | POA: Diagnosis not present

## 2021-12-27 DIAGNOSIS — G479 Sleep disorder, unspecified: Secondary | ICD-10-CM | POA: Diagnosis not present

## 2021-12-27 DIAGNOSIS — I6529 Occlusion and stenosis of unspecified carotid artery: Secondary | ICD-10-CM | POA: Diagnosis not present

## 2021-12-27 DIAGNOSIS — M542 Cervicalgia: Secondary | ICD-10-CM | POA: Diagnosis not present

## 2021-12-27 DIAGNOSIS — N401 Enlarged prostate with lower urinary tract symptoms: Secondary | ICD-10-CM | POA: Diagnosis not present

## 2021-12-27 DIAGNOSIS — E1159 Type 2 diabetes mellitus with other circulatory complications: Secondary | ICD-10-CM | POA: Diagnosis not present

## 2021-12-27 DIAGNOSIS — Z8673 Personal history of transient ischemic attack (TIA), and cerebral infarction without residual deficits: Secondary | ICD-10-CM | POA: Diagnosis not present

## 2022-01-19 ENCOUNTER — Emergency Department: Payer: Medicare Other

## 2022-01-19 ENCOUNTER — Inpatient Hospital Stay
Admission: EM | Admit: 2022-01-19 | Discharge: 2022-01-23 | DRG: 812 | Disposition: A | Discharge status: Skilled Nursing Facility | Payer: Medicare Other | Source: Skilled Nursing Facility | Attending: Internal Medicine | Admitting: Internal Medicine

## 2022-01-19 ENCOUNTER — Observation Stay: Payer: Medicare Other

## 2022-01-19 ENCOUNTER — Other Ambulatory Visit: Payer: Self-pay

## 2022-01-19 ENCOUNTER — Encounter: Payer: Self-pay | Admitting: Emergency Medicine

## 2022-01-19 DIAGNOSIS — K219 Gastro-esophageal reflux disease without esophagitis: Secondary | ICD-10-CM | POA: Diagnosis present

## 2022-01-19 DIAGNOSIS — Z9852 Vasectomy status: Secondary | ICD-10-CM | POA: Diagnosis not present

## 2022-01-19 DIAGNOSIS — J8410 Pulmonary fibrosis, unspecified: Secondary | ICD-10-CM | POA: Diagnosis present

## 2022-01-19 DIAGNOSIS — J90 Pleural effusion, not elsewhere classified: Secondary | ICD-10-CM | POA: Diagnosis not present

## 2022-01-19 DIAGNOSIS — F0280 Dementia in other diseases classified elsewhere without behavioral disturbance: Secondary | ICD-10-CM | POA: Diagnosis present

## 2022-01-19 DIAGNOSIS — Z8249 Family history of ischemic heart disease and other diseases of the circulatory system: Secondary | ICD-10-CM

## 2022-01-19 DIAGNOSIS — R5381 Other malaise: Secondary | ICD-10-CM | POA: Diagnosis not present

## 2022-01-19 DIAGNOSIS — E1165 Type 2 diabetes mellitus with hyperglycemia: Secondary | ICD-10-CM | POA: Diagnosis present

## 2022-01-19 DIAGNOSIS — D509 Iron deficiency anemia, unspecified: Secondary | ICD-10-CM | POA: Diagnosis present

## 2022-01-19 DIAGNOSIS — E872 Acidosis, unspecified: Secondary | ICD-10-CM | POA: Diagnosis not present

## 2022-01-19 DIAGNOSIS — C3490 Malignant neoplasm of unspecified part of unspecified bronchus or lung: Secondary | ICD-10-CM

## 2022-01-19 DIAGNOSIS — K5900 Constipation, unspecified: Secondary | ICD-10-CM | POA: Diagnosis present

## 2022-01-19 DIAGNOSIS — R6510 Systemic inflammatory response syndrome (SIRS) of non-infectious origin without acute organ dysfunction: Secondary | ICD-10-CM

## 2022-01-19 DIAGNOSIS — F0150 Vascular dementia without behavioral disturbance: Secondary | ICD-10-CM | POA: Diagnosis present

## 2022-01-19 DIAGNOSIS — Z66 Do not resuscitate: Secondary | ICD-10-CM | POA: Diagnosis not present

## 2022-01-19 DIAGNOSIS — E785 Hyperlipidemia, unspecified: Secondary | ICD-10-CM | POA: Diagnosis present

## 2022-01-19 DIAGNOSIS — I1310 Hypertensive heart and chronic kidney disease without heart failure, with stage 1 through stage 4 chronic kidney disease, or unspecified chronic kidney disease: Secondary | ICD-10-CM | POA: Diagnosis present

## 2022-01-19 DIAGNOSIS — R739 Hyperglycemia, unspecified: Secondary | ICD-10-CM | POA: Diagnosis not present

## 2022-01-19 DIAGNOSIS — G4730 Sleep apnea, unspecified: Secondary | ICD-10-CM | POA: Diagnosis present

## 2022-01-19 DIAGNOSIS — Z79899 Other long term (current) drug therapy: Secondary | ICD-10-CM

## 2022-01-19 DIAGNOSIS — I2510 Atherosclerotic heart disease of native coronary artery without angina pectoris: Secondary | ICD-10-CM | POA: Diagnosis present

## 2022-01-19 DIAGNOSIS — C3492 Malignant neoplasm of unspecified part of left bronchus or lung: Secondary | ICD-10-CM | POA: Diagnosis present

## 2022-01-19 DIAGNOSIS — N400 Enlarged prostate without lower urinary tract symptoms: Secondary | ICD-10-CM | POA: Diagnosis present

## 2022-01-19 DIAGNOSIS — D508 Other iron deficiency anemias: Secondary | ICD-10-CM | POA: Diagnosis present

## 2022-01-19 DIAGNOSIS — Z96652 Presence of left artificial knee joint: Secondary | ICD-10-CM | POA: Diagnosis present

## 2022-01-19 DIAGNOSIS — R404 Transient alteration of awareness: Secondary | ICD-10-CM | POA: Diagnosis not present

## 2022-01-19 DIAGNOSIS — Z20822 Contact with and (suspected) exposure to covid-19: Secondary | ICD-10-CM | POA: Diagnosis present

## 2022-01-19 DIAGNOSIS — M1710 Unilateral primary osteoarthritis, unspecified knee: Secondary | ICD-10-CM | POA: Diagnosis present

## 2022-01-19 DIAGNOSIS — Z85828 Personal history of other malignant neoplasm of skin: Secondary | ICD-10-CM

## 2022-01-19 DIAGNOSIS — I252 Old myocardial infarction: Secondary | ICD-10-CM

## 2022-01-19 DIAGNOSIS — Z87891 Personal history of nicotine dependence: Secondary | ICD-10-CM

## 2022-01-19 DIAGNOSIS — N1831 Chronic kidney disease, stage 3a: Secondary | ICD-10-CM | POA: Diagnosis present

## 2022-01-19 DIAGNOSIS — Z743 Need for continuous supervision: Secondary | ICD-10-CM | POA: Diagnosis not present

## 2022-01-19 DIAGNOSIS — R0600 Dyspnea, unspecified: Secondary | ICD-10-CM | POA: Diagnosis not present

## 2022-01-19 DIAGNOSIS — W19XXXA Unspecified fall, initial encounter: Secondary | ICD-10-CM | POA: Diagnosis not present

## 2022-01-19 DIAGNOSIS — N183 Chronic kidney disease, stage 3 unspecified: Secondary | ICD-10-CM

## 2022-01-19 DIAGNOSIS — K449 Diaphragmatic hernia without obstruction or gangrene: Secondary | ICD-10-CM

## 2022-01-19 DIAGNOSIS — R410 Disorientation, unspecified: Secondary | ICD-10-CM | POA: Diagnosis not present

## 2022-01-19 DIAGNOSIS — Z972 Presence of dental prosthetic device (complete) (partial): Secondary | ICD-10-CM

## 2022-01-19 DIAGNOSIS — J439 Emphysema, unspecified: Secondary | ICD-10-CM | POA: Diagnosis not present

## 2022-01-19 DIAGNOSIS — Z888 Allergy status to other drugs, medicaments and biological substances status: Secondary | ICD-10-CM

## 2022-01-19 DIAGNOSIS — Z7902 Long term (current) use of antithrombotics/antiplatelets: Secondary | ICD-10-CM

## 2022-01-19 DIAGNOSIS — D500 Iron deficiency anemia secondary to blood loss (chronic): Secondary | ICD-10-CM | POA: Diagnosis present

## 2022-01-19 DIAGNOSIS — Z8673 Personal history of transient ischemic attack (TIA), and cerebral infarction without residual deficits: Secondary | ICD-10-CM

## 2022-01-19 DIAGNOSIS — Z880 Allergy status to penicillin: Secondary | ICD-10-CM

## 2022-01-19 DIAGNOSIS — R29818 Other symptoms and signs involving the nervous system: Secondary | ICD-10-CM | POA: Diagnosis not present

## 2022-01-19 DIAGNOSIS — D649 Anemia, unspecified: Secondary | ICD-10-CM | POA: Diagnosis not present

## 2022-01-19 DIAGNOSIS — J9811 Atelectasis: Secondary | ICD-10-CM | POA: Diagnosis not present

## 2022-01-19 DIAGNOSIS — Z7984 Long term (current) use of oral hypoglycemic drugs: Secondary | ICD-10-CM

## 2022-01-19 DIAGNOSIS — G309 Alzheimer's disease, unspecified: Secondary | ICD-10-CM | POA: Diagnosis present

## 2022-01-19 DIAGNOSIS — Z820 Family history of epilepsy and other diseases of the nervous system: Secondary | ICD-10-CM

## 2022-01-19 DIAGNOSIS — E1122 Type 2 diabetes mellitus with diabetic chronic kidney disease: Secondary | ICD-10-CM | POA: Diagnosis present

## 2022-01-19 DIAGNOSIS — Z974 Presence of external hearing-aid: Secondary | ICD-10-CM

## 2022-01-19 DIAGNOSIS — Z881 Allergy status to other antibiotic agents status: Secondary | ICD-10-CM

## 2022-01-19 DIAGNOSIS — R000 Tachycardia, unspecified: Secondary | ICD-10-CM | POA: Diagnosis not present

## 2022-01-19 DIAGNOSIS — Z87440 Personal history of urinary (tract) infections: Secondary | ICD-10-CM

## 2022-01-19 DIAGNOSIS — R4781 Slurred speech: Secondary | ICD-10-CM | POA: Diagnosis not present

## 2022-01-19 LAB — RETICULOCYTES
Immature Retic Fract: 35 % — ABNORMAL HIGH (ref 2.3–15.9)
RBC.: 2.92 MIL/uL — ABNORMAL LOW (ref 4.22–5.81)
Retic Count, Absolute: 95.5 10*3/uL (ref 19.0–186.0)
Retic Ct Pct: 3.3 % — ABNORMAL HIGH (ref 0.4–3.1)

## 2022-01-19 LAB — CBC WITH DIFFERENTIAL/PLATELET
Abs Immature Granulocytes: 0.13 10*3/uL — ABNORMAL HIGH (ref 0.00–0.07)
Basophils Absolute: 0 10*3/uL (ref 0.0–0.1)
Basophils Relative: 0 %
Eosinophils Absolute: 0 10*3/uL (ref 0.0–0.5)
Eosinophils Relative: 0 %
HCT: 24.5 % — ABNORMAL LOW (ref 39.0–52.0)
Hemoglobin: 6.9 g/dL — ABNORMAL LOW (ref 13.0–17.0)
Immature Granulocytes: 1 %
Lymphocytes Relative: 16 %
Lymphs Abs: 1.7 10*3/uL (ref 0.7–4.0)
MCH: 24.4 pg — ABNORMAL LOW (ref 26.0–34.0)
MCHC: 28.2 g/dL — ABNORMAL LOW (ref 30.0–36.0)
MCV: 86.6 fL (ref 80.0–100.0)
Monocytes Absolute: 1 10*3/uL (ref 0.1–1.0)
Monocytes Relative: 9 %
Neutro Abs: 8 10*3/uL — ABNORMAL HIGH (ref 1.7–7.7)
Neutrophils Relative %: 74 %
Platelets: 376 10*3/uL (ref 150–400)
RBC: 2.83 MIL/uL — ABNORMAL LOW (ref 4.22–5.81)
RDW: 18.5 % — ABNORMAL HIGH (ref 11.5–15.5)
WBC: 10.7 10*3/uL — ABNORMAL HIGH (ref 4.0–10.5)
nRBC: 1 % — ABNORMAL HIGH (ref 0.0–0.2)

## 2022-01-19 LAB — HEPATIC FUNCTION PANEL
ALT: 25 U/L (ref 0–44)
AST: 50 U/L — ABNORMAL HIGH (ref 15–41)
Albumin: 3 g/dL — ABNORMAL LOW (ref 3.5–5.0)
Alkaline Phosphatase: 62 U/L (ref 38–126)
Bilirubin, Direct: 0.1 mg/dL (ref 0.0–0.2)
Indirect Bilirubin: 0.3 mg/dL (ref 0.3–0.9)
Total Bilirubin: 0.4 mg/dL (ref 0.3–1.2)
Total Protein: 7.2 g/dL (ref 6.5–8.1)

## 2022-01-19 LAB — BLOOD GAS, VENOUS
Acid-base deficit: 0.6 mmol/L (ref 0.0–2.0)
Bicarbonate: 24.9 mmol/L (ref 20.0–28.0)
O2 Saturation: 48.1 %
Patient temperature: 37
pCO2, Ven: 43 mmHg — ABNORMAL LOW (ref 44–60)
pH, Ven: 7.37 (ref 7.25–7.43)
pO2, Ven: 31 mmHg — CL (ref 32–45)

## 2022-01-19 LAB — URINALYSIS, COMPLETE (UACMP) WITH MICROSCOPIC
Bacteria, UA: NONE SEEN
Bilirubin Urine: NEGATIVE
Glucose, UA: >=500 mg/dL — AB
Hgb urine dipstick: NEGATIVE
Ketones, ur: NEGATIVE mg/dL
Leukocytes,Ua: NEGATIVE
Nitrite: NEGATIVE
Protein, ur: 30 mg/dL — AB
Specific Gravity, Urine: 1.025 (ref 1.005–1.030)
pH: 5 (ref 5.0–8.0)

## 2022-01-19 LAB — BASIC METABOLIC PANEL
Anion gap: 12 (ref 5–15)
BUN: 41 mg/dL — ABNORMAL HIGH (ref 8–23)
CO2: 20 mmol/L — ABNORMAL LOW (ref 22–32)
Calcium: 8 mg/dL — ABNORMAL LOW (ref 8.9–10.3)
Chloride: 105 mmol/L (ref 98–111)
Creatinine, Ser: 1.48 mg/dL — ABNORMAL HIGH (ref 0.61–1.24)
GFR, Estimated: 44 mL/min — ABNORMAL LOW (ref >60)
Glucose, Bld: 361 mg/dL — ABNORMAL HIGH (ref 70–99)
Potassium: 4 mmol/L (ref 3.5–5.1)
Sodium: 137 mmol/L (ref 135–145)

## 2022-01-19 LAB — PREPARE RBC (CROSSMATCH)

## 2022-01-19 LAB — PROTIME-INR
INR: 1.2 (ref 0.8–1.2)
Prothrombin Time: 15 seconds (ref 11.4–15.2)

## 2022-01-19 LAB — IRON AND TIBC
Iron: 20 ug/dL — ABNORMAL LOW (ref 45–182)
Saturation Ratios: 5 % — ABNORMAL LOW (ref 17.9–39.5)
TIBC: 413 ug/dL (ref 250–450)
UIBC: 393 ug/dL

## 2022-01-19 LAB — BRAIN NATRIURETIC PEPTIDE: B Natriuretic Peptide: 28.5 pg/mL (ref 0.0–100.0)

## 2022-01-19 LAB — RESP PANEL BY RT-PCR (FLU A&B, COVID) ARPGX2
Influenza A by PCR: NEGATIVE
Influenza B by PCR: NEGATIVE
SARS Coronavirus 2 by RT PCR: NEGATIVE

## 2022-01-19 LAB — CBG MONITORING, ED
Glucose-Capillary: 326 mg/dL — ABNORMAL HIGH (ref 70–99)
Glucose-Capillary: 247 mg/dL — ABNORMAL HIGH (ref 70–99)

## 2022-01-19 LAB — PROCALCITONIN: Procalcitonin: 0.53 ng/mL

## 2022-01-19 LAB — APTT: aPTT: 27 seconds (ref 24–36)

## 2022-01-19 LAB — LACTIC ACID, PLASMA: Lactic Acid, Venous: 3.4 mmol/L (ref 0.5–1.9)

## 2022-01-19 LAB — BETA-HYDROXYBUTYRIC ACID: Beta-Hydroxybutyric Acid: 0.14 mmol/L (ref 0.05–0.27)

## 2022-01-19 MED ORDER — SODIUM CHLORIDE 0.9 % IV SOLN: 10.0000 mL/h | Freq: Once | INTRAVENOUS | Status: DC

## 2022-01-19 MED ORDER — LACTATED RINGERS IV BOLUS
1000.0000 mL | Freq: Once | INTRAVENOUS | Status: AC
  Administered 2022-01-19: 1000 mL via INTRAVENOUS

## 2022-01-19 MED ORDER — INSULIN ASPART 100 UNIT/ML IJ SOLN
0.0000 [IU] | INTRAMUSCULAR | Status: DC
  Administered 2022-01-19: 5 [IU] via SUBCUTANEOUS
  Filled 2022-01-19: qty 1

## 2022-01-19 MED ORDER — SODIUM CHLORIDE 0.9 % IV SOLN
2.0000 g | Freq: Once | INTRAVENOUS | Status: AC
  Administered 2022-01-19: 2 g via INTRAVENOUS
  Filled 2022-01-19: qty 2

## 2022-01-19 MED ORDER — SODIUM CHLORIDE 0.9 % IV BOLUS: 1000.0000 mL | Freq: Once | INTRAVENOUS | Status: DC

## 2022-01-19 NOTE — Assessment & Plan Note (Addendum)
Discussed with the patient's significant other [name] on admission.   DNR/DNI status. Yellow DNR form in chart.

## 2022-01-19 NOTE — H&P (Signed)
History and Physical    RYZEN DEADY WCB:[id number] DOB: [birth year] DOA: 01/19/2022  DOS: the patient was seen and examined on 01/19/2022  PCP: Venia Carbon, MD   Patient coming from: ALF  I have personally briefly reviewed patient's old medical records in [place]  CC: fatigue HPI: 88 year old white male with a history of stroke, vascular dementia, Alzheimer's dementia, untreated left lung cancer, CKD stage IIIa, type 2 diabetes currently lives at memory care assisted living.  Patient is unable to give history.  His significant other Osvaldo Angst is at the bedside.  She states over the last 2 weeks, the patient's had increasing fatigue.  She denies the patient having any black or tarry stools.  Patient does not been eating well according to her.  Patient unable to give history.  Patient's assisted-living provider sent him to the ER for evaluation.  On arrival, temperature 97.8, heart rate 108, blood pressure 125/77, room air saturations 96%.  Lab evaluation  White count 10.7, hemoglobin 6.9, platelets 376  Sodium 137, BUN of 41, creatinine 1.48  Lactic acid elevated at 3.4  UA was negative for any source of infection.  BNP was normal at 28.5  Chest x-ray is read out as bilateral pleural effusions.  CT head showed no acute intracranial findings.  Noncontrasted chest CT pending.  [facility] hospitalist contacted for admission.   ED Course: pt noted to be anemic.  1 unit of packed red blood cells ordered.  Lactic acid level was elevated but patient was afebrile.  Patient had blood cultures performed and IV antibiotics started.  No source for his lactic acidosis noted aside from his anemia.  Review of Systems:  Review of Systems  Unable to perform ROS: Dementia   Past Medical History:  Diagnosis Date   Arthritis of knee    BPH (benign prostatic hypertrophy)    w/ nocturia   Coronary artery disease 03/2014   Inferior ST elevation myocardial infarction.  Cardiac catheterization showed an occluded mid RCA. He had an angioplasty and drug-eluting stent placement with a 3.0 x 16 mm Promus drug-eluting stent. Ejection fraction was 45% by echo, completed cardiac rehab 06/2014   CVA (cerebral vascular accident) ([place]) 03/2018   Dementia ([place])    s/p stroke   Diabetes type 2, controlled ([place])    Pt is taking Metformin   GERD (gastroesophageal reflux disease)    Heart murmur    keeping an eye on it but not treating   History of basal cell cancer    s/p mohs   History of colon polyps    History of hiatal hernia 05/2018   this causes laryngeal spasms. pt was taking nitro for this when bp drops and he ends up with tia symptoms   History of hypertension    HLD (hyperlipidemia)    diet controlled in past   Hypertension    Insomnia    treated with multiple meds in past   Lung cancer ([place])    MI (myocardial infarction) ([place]) 2015   Rosacea    Skin cancer    Sleep apnea    has cpap but does not use properly and does not like it!!   Stroke ([place]) 03/2018   per cat scan, patient has had several strokes   TIA (transient ischemic attack)    x 2 since CVA 03-2018   UTI (urinary tract infection)    Wears dentures    partial lower   Wears hearing aid in both ears  Past Surgical History:  Procedure Laterality Date   CARDIAC CATHETERIZATION  03/2014   [facility];x1 stent   CAROTID PTA/STENT INTERVENTION Right 06/05/2018   Algernon Huxley, MD   CATARACT EXTRACTION W/PHACO Left 10/09/2016   Procedure: CATARACT EXTRACTION PHACO AND INTRAOCULAR LENS PLACEMENT ([place]);  Surgeon: Birder Robson, MD;  Location: [facility] ORS;  Service: Ophthalmology;  Laterality: Left;  [place] 1.13 AP% 18.3 CDE 13.45 Fluid pack lot # [id number]   CATARACT EXTRACTION W/PHACO Right 05/17/2020   Procedure: CATARACT EXTRACTION PHACO AND INTRAOCULAR LENS PLACEMENT ([facility]) RIGHT DIABETIC 14.51  01:37.4  ;  Surgeon: Birder Robson, MD;  Location: [place];  Service: Ophthalmology;   Laterality: Right;  Diabetic - oral meds Requests later AM arrival   COLONOSCOPY  08/2010   hyperplastic polyp, rec rpt 5 yrs   ESOPHAGOGASTRODUODENOSCOPY  05/2015   dilated stricture, normal biopsies, HH, no definite infection [name] @ [facility])   ESOPHAGOGASTRODUODENOSCOPY (EGD) WITH PROPOFOL N/A 09/18/2019   normal esophagus and duodenum, large HH ([place])   EYE SURGERY Left    cataract extraction   MOHS SURGERY     basal cell chin/back   REPLACEMENT TOTAL KNEE Left 08/2006   VASECTOMY  1970     reports that he quit smoking about 59 years ago. His smoking use included cigarettes. He has never used smokeless tobacco. He reports that he does not currently use alcohol. He reports that he does not use drugs.  Allergies  Allergen Reactions   Aricept [Donepezil Hcl] Diarrhea    severe   Belsomra [Suvorexant] Other (See Comments)    Anxiety. Did not work   Vancomycin Itching, Rash and Other (See Comments)    Burning pain    Ambien [Zolpidem Tartrate] Other (See Comments)    Gi problems   Atorvastatin     muscle aches   Doxycycline Rash   Penicillins Hives    Has patient had a PCN reaction causing immediate rash, facial/tongue/throat swelling, SOB or lightheadedness with hypotension: Yes Has patient had a PCN reaction causing severe rash involving mucus membranes or skin necrosis: Yes Has patient had a PCN reaction that required hospitalization No Has patient had a PCN reaction occurring within the last 10 years: No If all of the above answers are "NO", then may proceed with Cephalosporin use.    Tamsulosin Other (See Comments)    Leg weakness.     Family History  Problem Relation Age of Onset   Cancer Father 73       prostate and colon   Cancer Mother        ovarian or uterine   CAD Brother 42       MI, smoker   Parkinson's disease Brother    COPD Brother    CAD Brother        MI   Stroke Sister     Prior to Admission medications   Medication Sig Start Date End Date  Taking? Authorizing Provider  acetaminophen (TYLENOL) 650 MG CR tablet Take 650 mg by mouth every 8 (eight) hours.   Yes [provider]  bisacodyl (DULCOLAX) 10 MG suppository Place 10 mg rectally daily as needed for moderate constipation.   Yes [provider]  clonazePAM (KLONOPIN) 1 MG tablet Take 1 tablet (1 mg total) by mouth at bedtime. 03/15/21  Yes Ria Bush, MD  clopidogrel (PLAVIX) 75 MG tablet TAKE 1 TABLET (75 MG TOTAL) BY MOUTH AT BEDTIME. 11/15/20  Yes Ria Bush, MD  diclofenac Sodium (VOLTAREN) 1 %  GEL Apply 2 g topically 3 (three) times daily as needed (pain).   Yes [provider]  docusate sodium (COLACE) 100 MG capsule Take 100 mg by mouth daily.   Yes [provider]  finasteride (PROSCAR) 5 MG tablet TAKE 1 TABLET BY MOUTH EVERY DAY 02/14/21  Yes Ria Bush, MD  memantine (NAMENDA) 10 MG tablet TAKE 1 TABLET BY MOUTH TWICE A DAY 10/03/20  Yes Ria Bush, MD  metFORMIN (GLUCOPHAGE) 500 MG tablet TAKE 1 TABLET BY MOUTH EVERY DAY WITH BREAKFAST Patient taking differently: Take 500 mg by mouth 2 (two) times daily. 01/06/21  Yes Ria Bush, MD  Multiple Vitamin (MULTIVITAMIN WITH MINERALS) TABS tablet Take 1 tablet by mouth daily.   Yes [provider]  polyethylene glycol (MIRALAX / GLYCOLAX) 17 g packet Take 17 g by mouth every other day.   Yes [provider]  pravastatin (PRAVACHOL) 40 MG tablet TAKE 1 TABLET BY MOUTH EVERY DAY 11/15/20  Yes Ria Bush, MD  sodium fluoride (PREVIDENT 5000 PLUS) 1.1 % CREA dental cream Place 1 application. onto teeth every evening.   Yes [provider]  vitamin B-12 (CYANOCOBALAMIN) 500 MCG tablet Take 500 mcg by mouth daily.   Yes [provider]  pantoprazole (PROTONIX) 40 MG tablet TAKE 1 TABLET BY MOUTH TWICE A DAY Patient not taking: Reported on 01/19/2022 11/15/20   Ria Bush, MD    Physical Exam: Vitals:   01/19/22 1945  01/19/22 2000 01/19/22 2030 01/19/22 2100  BP: 124/81 131/77 113/66 (!) 121/59  Pulse: (!) 105 (!) 104 (!) 104 98  Resp: (!) 22  (!) 22 16  Temp:      TempSrc:      SpO2: 98% 97% 96% 96%    Physical Exam Vitals and nursing note reviewed.  Constitutional:      General: He is not in acute distress.    Appearance: He is not toxic-appearing or diaphoretic.     Comments: Elderly white male.  Nonverbal.  Eyes closed.  HENT:     Head: Normocephalic and atraumatic.     Nose: No rhinorrhea.  Cardiovascular:     Rate and Rhythm: Normal rate and regular rhythm.  Pulmonary:     Effort: No respiratory distress.     Breath sounds: Examination of the right-lower field reveals decreased breath sounds. Examination of the left-lower field reveals decreased breath sounds. Decreased breath sounds present. No wheezing.  Abdominal:     General: Bowel sounds are normal. There is no distension.     Tenderness: There is no abdominal tenderness. There is no guarding or rebound.  Musculoskeletal:     Right lower leg: No edema.     Left lower leg: No edema.  Skin:    General: Skin is warm and dry.  Neurological:     Comments: Eyes were closed.     Labs on Admission: I have personally reviewed following labs and imaging studies  CBC: Recent Labs  Lab 01/19/22 1808  WBC 10.7*  NEUTROABS 8.0*  HGB 6.9*  HCT 24.5*  MCV 86.6  PLT 500   Basic Metabolic Panel: Recent Labs  Lab 01/19/22 1808  NA 137  K 4.0  CL 105  CO2 20*  GLUCOSE 361*  BUN 41*  CREATININE 1.48*  CALCIUM 8.0*   GFR: CrCl cannot be calculated (Unknown ideal weight.). Liver Function Tests: Recent Labs  Lab 01/19/22 1950  AST 50*  ALT 25  ALKPHOS 62  BILITOT 0.4  PROT 7.2  ALBUMIN 3.0*   No results for input(s): LIPASE, AMYLASE in the last 168 hours. No results for input(s): AMMONIA in the last 168 hours. Coagulation Profile: No results for input(s): INR, PROTIME in the last 168 hours. Cardiac Enzymes: No  results for input(s): CKTOTAL, CKMB, CKMBINDEX, TROPONINI, TROPONINIHS in the last 168 hours. BNP (last 3 results) No results for input(s): PROBNP in the last 8760 hours. HbA1C: No results for input(s): HGBA1C in the last 72 hours. CBG: Recent Labs  Lab 01/19/22 1802 01/19/22 2129  GLUCAP 326* 247*   Lipid Profile: No results for input(s): CHOL, HDL, LDLCALC, TRIG, CHOLHDL, LDLDIRECT in the last 72 hours. Thyroid Function Tests: No results for input(s): TSH, T4TOTAL, FREET4, T3FREE, THYROIDAB in the last 72 hours. Anemia Panel: Recent Labs    01/19/22 2021 01/19/22 2028  TIBC 413  --   IRON 20*  --   RETICCTPCT  --  3.3*   Urine analysis:    Component Value Date/Time   COLORURINE YELLOW (A) 01/19/2022 1845   APPEARANCEUR HAZY (A) 01/19/2022 1845   LABSPEC 1.025 01/19/2022 1845   PHURINE 5.0 01/19/2022 1845   GLUCOSEU >=500 (A) 01/19/2022 1845   HGBUR NEGATIVE 01/19/2022 1845   BILIRUBINUR NEGATIVE 01/19/2022 1845   BILIRUBINUR Negative 05/24/2014 [place] NEGATIVE 01/19/2022 1845   PROTEINUR 30 (A) 01/19/2022 1845   UROBILINOGEN negative 05/24/2014 1239   NITRITE NEGATIVE 01/19/2022 1845   LEUKOCYTESUR NEGATIVE 01/19/2022 1845    Radiological Exams on Admission: I have personally reviewed images DG Chest 2 View  Result Date: 01/19/2022 CLINICAL DATA:  Hyperglycemia for 3 days.  Slurred speech. EXAM: CHEST - 2 VIEW COMPARISON:  12/02/2018 FINDINGS: Shallow inspiration. Probable mild cardiac enlargement. No vascular congestion. Bilateral pleural effusions with basilar atelectasis. Degenerative changes in the shoulders. IMPRESSION: Cardiac enlargement with bilateral pleural effusions and basilar atelectasis. Electronically Signed   By: Lucienne Capers M.D.   On: 01/19/2022 19:25   CT HEAD WO CONTRAST (5MM)  Result Date: 01/19/2022 CLINICAL DATA:  Neurological deficit, slurred speech EXAM: CT HEAD WITHOUT CONTRAST TECHNIQUE: Contiguous axial images were obtained  from the base of the skull through the vertex without intravenous contrast. RADIATION DOSE REDUCTION: This exam was performed according to the departmental dose-optimization program which includes automated exposure control, adjustment of the mA and/or kV according to patient size and/or use of iterative reconstruction technique. COMPARISON:  05/06/2019 FINDINGS: Brain: No acute intracranial findings are seen. Cortical sulci are prominent. There is decreased density in the periventricular white matter. There is prominence of third and both lateral ventricles. Vascular: Scattered arterial calcifications are seen. Skull: Unremarkable. Sinuses/Orbits: There is mucosal thickening in the ethmoid sinus. Other: None IMPRESSION: No acute intracranial findings are seen in noncontrast CT brain. Central and cortical atrophy. Chronic ethmoid sinusitis. Electronically Signed   By: Elmer Picker M.D.   On: 01/19/2022 18:30    EKG: I have personally reviewed EKG: sinus tachycardia   Assessment/Plan Principal Problem:   Symptomatic anemia Active Problems:   Controlled type 2 diabetes mellitus with stage 3 chronic kidney disease, without long-term current use of insulin (HCC)   Pulmonary interstitial fibrosis (HCC)   Hiatal hernia   DNR (do not resuscitate)   Stage 3a chronic kidney disease (CKD) ([place])   Vascular dementia ([place])   Lung cancer ([place]) - left side. untreated.    Assessment and Plan: * Symptomatic anemia Admit to observation medical bed.  Discussed with the patient's significant other [name].  They have been  together for over 25 years.  She does not want to pursue any aggressive measures.  She declines any GI work-up including colonoscopy and/or EGD.  She is okay with the patient receiving blood transfusion.  Discussed the possibility of him having GI cancer, metastatic disease from his lung cancer.  She is aware of this.  She states the patient has poor quality of life.  However she is not ready  to make him comfort care just yet.  We will continue DNR status.  1 unit packed red blood cell transfusion ordered by the ER.  Repeat CBC in the morning.  Patient did have an elevated lactic acid but without a fever.  Unclear if he has a infection or not.  Antibiotics were started in the ER.  We will hold on further antibiotic therapy.  Chest CT will show if he has pneumonia or not.  Lung cancer ([place]) - left side. untreated. Patient has a malignant left lung nodule.  [name], patient's significant other states that this is not being treated.  Of note, patient had a PET scan in October 2020 that showed his left lung nodule concerning for bronchogenic carcinoma.  He also had distal esophageal hyperactivity.  They are also concerned about esophageal cancer.  Patient never had a work-up for this.  Vascular dementia ([place]) Chronic.  Significant other also states he has Alzheimer's type dementia.  Patient sometimes is able to recognize his significant other.  However he is not oriented x3.  Stage 3a chronic kidney disease (CKD) (HCC) Chronic.  DNR (do not resuscitate) Discussed with the patient's significant other [name].  Continue DNR/DNI status.     Hiatal hernia Chest x-ray shows possible right sided pleural effusion versus hiatal hernia.  Unclear which it is.  We will check a noncontrasted chest CT to further delay or delineate what the abnormality in the right base of his CXR.  Pulmonary interstitial fibrosis (HCC) Stable.  Not on home O2.  Controlled type 2 diabetes mellitus with stage 3 chronic kidney disease, without long-term current use of insulin (HCC) Hold metformin.  Place the patient on sliding scale.   DVT prophylaxis: SCDs Code Status: DNR/DNI(Do NOT Intubate) verified with his significant other Osvaldo Angst Family Communication: Discussed with his significant other Osvaldo Angst Disposition Plan: Return back to assisted living Consults called: None Admission status:  Observation, Med-Surg   Kristopher Oppenheim, DO [facility] 01/19/2022, 9:41 PM

## 2022-01-19 NOTE — ED Notes (Signed)
Secretary setting up transport for pt to go to room 128.  ?

## 2022-01-19 NOTE — ED Provider Notes (Addendum)
[facility] Emergency Department Provider Note     Event Date/Time   First MD Initiated Contact with Patient 01/19/22 1753     (approximate)   History   Hyperglycemia   HPI  Stephen [name] is a 88 y.o. male with a history that includes type 2 diabetes, CKD, Alzheimer's dementia, and hypertension, presents to the ED from his memory care facility.  According to EMS report, the patient has had 3 days of hyperglycemia intermittent episodes of slurred speech according to staff. Patient apparently had a fall last week, that did not include a head injury. Patient is alert to self, but is unable to relay any complaints at this time. His long-term partner is at bedside for additional history. She notes he is DNR/DNI, but does want current treatment aimed at correcting abnormal labs and potentially returning patient to baseline.   Physical Exam   Triage Vital Signs: ED Triage Vitals  Enc Vitals Group     BP 01/19/22 1757 125/77     Pulse Rate 01/19/22 1757 (!) 108     Resp 01/19/22 1757 18     Temp 01/19/22 1757 97.8 F (36.6 C)     Temp Source 01/19/22 1757 Oral     SpO2 01/19/22 1757 96 %     Weight --      Height --      Head Circumference --      Peak Flow --      Pain Score 01/19/22 1758 0     Pain Loc --      Pain Edu? --      Excl. in [place]? --     Most recent vital signs: Vitals:   01/19/22 2030 01/19/22 2100  BP: 113/66 (!) 121/59  Pulse: (!) 104 98  Resp: (!) 22 16  Temp:    SpO2: 96% 96%    General Awake, no distress.  HEENT NCAT. PERRL. EOMI. No rhinorrhea. Mucous membranes are moist.  CV:  Good peripheral perfusion. RRR RESP:  Normal effort. CTA ABD:  No distention. Soft, nontender.  Brown stool in the rectal vault.  ED Results / Procedures / Treatments   Labs (all labs ordered are listed, but only abnormal results are displayed) Labs Reviewed  CBC WITH DIFFERENTIAL/PLATELET - Abnormal; Notable for the following components:       Result Value   WBC 10.7 (*)    RBC 2.83 (*)    Hemoglobin 6.9 (*)    HCT 24.5 (*)    MCH 24.4 (*)    MCHC 28.2 (*)    RDW 18.5 (*)    nRBC 1.0 (*)    Neutro Abs 8.0 (*)    Abs Immature Granulocytes 0.13 (*)    All other components within normal limits  BASIC METABOLIC PANEL - Abnormal; Notable for the following components:   CO2 20 (*)    Glucose, Bld 361 (*)    BUN 41 (*)    Creatinine, Ser 1.48 (*)    Calcium 8.0 (*)    GFR, Estimated 44 (*)    All other components within normal limits  LACTIC ACID, PLASMA - Abnormal; Notable for the following components:   Lactic Acid, Venous 3.4 (*)    All other components within normal limits  URINALYSIS, COMPLETE (UACMP) WITH MICROSCOPIC - Abnormal; Notable for the following components:   Color, Urine YELLOW (*)    APPearance HAZY (*)    Glucose, UA >=500 (*)    Protein,  ur 30 (*)    All other components within normal limits  HEPATIC FUNCTION PANEL - Abnormal; Notable for the following components:   Albumin 3.0 (*)    AST 50 (*)    All other components within normal limits  RETICULOCYTES - Abnormal; Notable for the following components:   Retic Ct Pct 3.3 (*)    RBC. 2.92 (*)    Immature Retic Fract 35.0 (*)    All other components within normal limits  IRON AND TIBC - Abnormal; Notable for the following components:   Iron 20 (*)    Saturation Ratios 5 (*)    All other components within normal limits  CBG MONITORING, ED - Abnormal; Notable for the following components:   Glucose-Capillary 326 (*)    All other components within normal limits  CBG MONITORING, ED - Abnormal; Notable for the following components:   Glucose-Capillary 247 (*)    All other components within normal limits  RESP PANEL BY RT-PCR (FLU A&B, COVID) ARPGX2  CULTURE, BLOOD (ROUTINE X 2)  CULTURE, BLOOD (ROUTINE X 2) W REFLEX TO ID PANEL  BETA-HYDROXYBUTYRIC ACID  APTT  BRAIN NATRIURETIC PEPTIDE  BLOOD GAS, VENOUS  PROCALCITONIN  HEMOGLOBIN A1C   LACTIC ACID, PLASMA  PROTIME-INR  TYPE AND SCREEN  PREPARE RBC (CROSSMATCH)     EKG  Sinus tachy rate 99-108 bpm PR interval 556 QRS duration 90 ms No STEMI  RADIOLOGY  I personally viewed and evaluated these images as part of my medical decision making, as well as reviewing the written report by the radiologist.  ED Provider Interpretation: no acute findings}  DG Chest 2 View  Result Date: 01/19/2022 CLINICAL DATA:  Hyperglycemia for 3 days.  Slurred speech. EXAM: CHEST - 2 VIEW COMPARISON:  12/02/2018 FINDINGS: Shallow inspiration. Probable mild cardiac enlargement. No vascular congestion. Bilateral pleural effusions with basilar atelectasis. Degenerative changes in the shoulders. IMPRESSION: Cardiac enlargement with bilateral pleural effusions and basilar atelectasis. Electronically Signed   By: Lucienne Capers M.D.   On: 01/19/2022 19:25   CT HEAD WO CONTRAST (5MM)  Result Date: 01/19/2022 CLINICAL DATA:  Neurological deficit, slurred speech EXAM: CT HEAD WITHOUT CONTRAST TECHNIQUE: Contiguous axial images were obtained from the base of the skull through the vertex without intravenous contrast. RADIATION DOSE REDUCTION: This exam was performed according to the departmental dose-optimization program which includes automated exposure control, adjustment of the mA and/or kV according to patient size and/or use of iterative reconstruction technique. COMPARISON:  05/06/2019 FINDINGS: Brain: No acute intracranial findings are seen. Cortical sulci are prominent. There is decreased density in the periventricular white matter. There is prominence of third and both lateral ventricles. Vascular: Scattered arterial calcifications are seen. Skull: Unremarkable. Sinuses/Orbits: There is mucosal thickening in the ethmoid sinus. Other: None IMPRESSION: No acute intracranial findings are seen in noncontrast CT brain. Central and cortical atrophy. Chronic ethmoid sinusitis. Electronically Signed   By:  Elmer Picker M.D.   On: 01/19/2022 18:30     PROCEDURES:  Critical Care performed: No  Procedures   MEDICATIONS ORDERED IN ED: Medications  0.9 %  sodium chloride infusion (has no administration in time range)  insulin aspart (novoLOG) injection 0-15 Units (has no administration in time range)  ceFEPIme (MAXIPIME) 2 g in sodium chloride 0.9 % 100 mL IVPB (2 g Intravenous New Bag/Given 01/19/22 2133)  lactated ringers bolus 1,000 mL (1,000 mLs Intravenous New Bag/Given 01/19/22 2124)     IMPRESSION / MDM / ASSESSMENT AND PLAN / ED  COURSE  I reviewed the triage vital signs and the nursing notes.                             Differential diagnosis includes, but is not limited to,  illicit or prescription medications, or other toxic ingestion; intracranial pathology such as stroke or intracerebral hemorrhage; fever or infectious causes including sepsis; hypoxemia and/or hypercarbia; uremia; trauma; endocrine related disorders such as diabetes, hypoglycemia, and thyroid-related diseases; hypertensive encephalopathy; etc.  The patient is on the cardiac monitor to evaluate for evidence of arrhythmia and/or significant heart rate changes.  Geriatric patient to the ED from his facility with concerns for altered mental status, slurred speech, generalized weakness for the last 2 weeks.  Patient is evaluated for his complaints in the ED, found to have significant anemia as well as an elevated lactic acid of 3.4.  Patient's other labs include a mildly elevated WBC and an elevated glucose which was his initial reporting complaint.  His CXR shows bilateral atelectasis and will be further evaluated by CT chest. No CT head evidence of an acute intracranial process. Patient's diagnosis is consistent with AMS, anemia, SIRS. Patient's significant other is agreeable to the plan for admission and ongoing treatment of his acute lab abnormalities.  Patient will be admitted to the hospital service.   FINAL  CLINICAL IMPRESSION(S) / ED DIAGNOSES   Final diagnoses:  Lactic acid acidosis  Anemia, unspecified type  SIRS (systemic inflammatory response syndrome) (HCC)  Pleural effusion     Rx / DC Orders   ED Discharge Orders     None        Note:  This document was prepared using Dragon voice recognition software and may include unintentional dictation errors.    Melvenia Needles, PA-C 01/19/22 2141    Melvenia Needles, PA-C 01/19/22 2143    Lucrezia Starch, MD 01/19/22 2144

## 2022-01-19 NOTE — ACP (Advance Care Planning) (Signed)
?  Advance Care Planning  ?Reason for Advance Care Planning Conversation: Acute hospitalization ?Principal Problem: ?  Symptomatic anemia ?Active Problems: ?  Controlled type 2 diabetes mellitus with stage 3 chronic kidney disease, without long-term current use of insulin ([place]) ?  Pulmonary interstitial fibrosis ([place]) ?  Hiatal hernia ?  DNR (do not resuscitate)/DNI(Do Not Intubate) ?  Stage 3a chronic kidney disease (CKD) ([place]) ?  Vascular dementia ([place]) ?  Lung cancer ([place]) - left side. untreated. ?  ? ?I discussed with patient's medical surrogate about advance care planning. Specifically, we discussed whether patient would desire cardiopulmonary resuscitation (CPR) in the event of acute cardiopulmonary arrest. We also discussed whether endotracheal intubation and temporary ventilator life support would be desired in the event of acute cardio- or pulmonary decompensation.  ? ?Code status order of DNR has been entered in accord with the patient's wishes. no intubation ? ?Living will: no ? ?Health Care Agent / Stephen [name]  ?            Name: Stephen [name]: Relationship to Patient: significant other  ? Is agent appointed in legal document  unknown ? ?Time spent today in ACP discussion was  5 mins ? ?Kristopher Oppenheim, DO ?[facility] ?  ? ? ? ?

## 2022-01-19 NOTE — Assessment & Plan Note (Addendum)
Respiratory status is stable, no dyspnea at this time.  Not on home O2.  On room air here. ? ?Chest CT on admission with diffuse interstitial pattern mostly peripherally distributed including areas of honeycomb formation and groundglass alveolar changes.  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 ?

## 2022-01-19 NOTE — ED Notes (Signed)
Attempted for 22g at R fa.  ?

## 2022-01-19 NOTE — Subjective & Objective (Addendum)
CC: fatigue ?HPI: ?88 year old white male with a history of stroke, vascular dementia, Alzheimer's dementia, untreated left lung cancer, CKD stage IIIa, type 2 diabetes currently lives at memory care assisted living.  Patient is unable to give history.  His significant other Stephen [name] is at the bedside.  She states over the last 2 weeks, the patient's had increasing fatigue.  She denies the patient having any black or tarry stools.  Patient does not been eating well according to her. ? ?Patient unable to give history. ? ?Patient's assisted-living provider sent him to the ER for evaluation. ? ?On arrival, temperature 97.8, heart rate 108, blood pressure 125/77, room air saturations 96%. ? ?Lab evaluation ? ?White count 10.7, hemoglobin 6.9, platelets 376 ? ?Sodium 137, BUN of 41, creatinine 1.48 ? ?Lactic acid elevated at 3.4 ? ?UA was negative for any source of infection. ? ?BNP was normal at 28.5 ? ?Chest x-ray is read out as bilateral pleural effusions. ? ?CT head showed no acute intracranial findings. ? ?Noncontrasted chest CT pending. ? ?[facility] contacted for admission. ?

## 2022-01-19 NOTE — Progress Notes (Signed)
? ? ?  CT chest without pneumonia, pleural effusion. ?What was seen on CXR is likely pt's elevated diaphragm and enlarged liver. ? ? ?CT Chest Wo Contrast ? ?Result Date: 01/19/2022 ?CLINICAL DATA:  Chronic dyspnea of unclear in etiology. EXAM: CT CHEST WITHOUT CONTRAST TECHNIQUE: Multidetector CT imaging of the chest was performed following the standard protocol without IV contrast. RADIATION DOSE REDUCTION: This exam was performed according to the departmental dose-optimization program which includes automated exposure control, adjustment of the mA and/or kV according to patient size and/or use of iterative reconstruction technique. COMPARISON:  Chest radiograph 01/19/2022.  CT 09/30/2020 FINDINGS: Cardiovascular: Normal heart size. No pericardial effusions. Normal caliber thoracic aorta. Calcification of the aorta and coronary arteries. Mediastinum/Nodes: Moderate esophageal hiatal hernia. Esophagus is decompressed. Esophageal wall appears diffusely thickened, probably due to reflux disease. Mediastinal lymph nodes are not pathologically enlarged. Thyroid gland is unremarkable. Lungs/Pleura: Diffuse interstitial pattern to the lungs with mostly peripheral distribution and including areas of honeycomb formation and ground-glass alveolar changes. 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 Emphysematous changes throughout the lungs. Masslike consolidation in the anteromedial left lingula measuring 2.8 x 4.2 cm. This area is enlarging since the previous study from 2021. Upper Abdomen: Heterogeneous low-attenuation demonstrated throughout the right lobe of the liver, new since prior study. Characterization is difficult on noncontrast imaging. This could represent geographic fatty infiltration, but with an enlarging lung mass, liver metastasis is suspected. No adrenal gland nodules. Musculoskeletal: Degenerative changes in the spine and shoulders. No destructive bone lesions.  IMPRESSION: 1. Enlarging mass in the left lingula measuring up to 2.8 x 4.2 cm. This is likely malignant. 2. Heterogeneous low-attenuation throughout the liver suspicious for liver metastasis or less likely fatty infiltration. 3. Peripheral interstitial pattern to the lungs suggesting usual interstitial pneumonia, likely with alveolitis. 4. Emphysematous changes in the lungs. 5. Moderate esophageal hiatal hernia. 6. Aortic atherosclerosis. Electronically Signed   By: Lucienne Capers M.D.   On: 01/19/2022 22:07    ? ? ?Kristopher Oppenheim, DO ?[facility] ? ?

## 2022-01-19 NOTE — ED Notes (Signed)
2nd blue tube collected and sent to lab as requested.  ?

## 2022-01-19 NOTE — ED Triage Notes (Signed)
Pt BIB [facility] from [facility] with reports of hyperglycemia x 3 days. Also reporting pt has had periods of slurred speech.  ?

## 2022-01-19 NOTE — Assessment & Plan Note (Addendum)
Dr. Imogene Burn discussed with the patient's significant other Clydie Braun on admission: no aggressive measures or invasive procedures, declined any GI work-up including colonoscopy and/or EGD.  She was agreeable with blood transfusions.   Status post total 2 units PRBCs transfused since admission Hbg 8.5 this AM, stable. --Oral Iron supplement started this AM

## 2022-01-19 NOTE — Assessment & Plan Note (Signed)
Chronic. 

## 2022-01-19 NOTE — Assessment & Plan Note (Addendum)
Patient has a malignant left lung nodule.  Stephen [name], patient's significant other states that this is not being treated. ? 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 ? ?Of note, patient had a PET scan in October 2020 that showed his left lung nodule concerning for bronchogenic carcinoma.  He also had distal esophageal hyperactivity.  They are also concerned about esophageal cancer.  Patient never had a work-up for this. ?

## 2022-01-19 NOTE — ED Notes (Signed)
Attempted x2 again for 2nd set of blood cultures at L wrist and L fa. Called lab to involve phlebotomy.  ?

## 2022-01-19 NOTE — Assessment & Plan Note (Addendum)
Chest x-ray on admission showed possible right sided pleural effusion versus hiatal hernia.  Subsequent chest CT consistent with moderate hiatal hernia and no pleural effusion seen. ?

## 2022-01-19 NOTE — ED Notes (Signed)
To room to give 5 units of insulin; pt away at imaging.  ?

## 2022-01-19 NOTE — Assessment & Plan Note (Addendum)
Hold metformin.  Sliding scale NovoLog.  Hemoglobin A1c this admission 8.9 up from prior 6.9.  Spouse reports his metformin had been reduced to only once daily because it was well controlled.  At discharge, increase metformin back to twice daily.  Defer further regimen changes to primary care.

## 2022-01-19 NOTE — Consult Note (Signed)
PHARMACY -  BRIEF ANTIBIOTIC NOTE  ? ?Pharmacy has received consult(s) for cefepime from an ED provider.  The patient's profile has been reviewed for ht/wt/allergies/indication/available labs.   ? ?One time order(s) placed for  ?Cefepime 2 gram  ? ?Further antibiotics/pharmacy consults should be ordered by admitting physician if indicated.       ?                ?Thank you, ?Dorothe Pea, PharmD, BCPS ?Clinical Pharmacist   ?01/19/2022  9:27 PM  ?

## 2022-01-19 NOTE — Assessment & Plan Note (Addendum)
Chronic.  Significant other also states he has Alzheimer's type dementia.  Patient sometimes is able to recognize his significant other, but not oriented x3. ?-- Continue home Namenda ?

## 2022-01-20 ENCOUNTER — Encounter: Payer: Self-pay | Admitting: Internal Medicine

## 2022-01-20 DIAGNOSIS — D508 Other iron deficiency anemias: Secondary | ICD-10-CM | POA: Diagnosis present

## 2022-01-20 DIAGNOSIS — Z66 Do not resuscitate: Secondary | ICD-10-CM | POA: Diagnosis present

## 2022-01-20 DIAGNOSIS — D649 Anemia, unspecified: Secondary | ICD-10-CM | POA: Diagnosis not present

## 2022-01-20 DIAGNOSIS — K5900 Constipation, unspecified: Secondary | ICD-10-CM | POA: Diagnosis present

## 2022-01-20 DIAGNOSIS — K449 Diaphragmatic hernia without obstruction or gangrene: Secondary | ICD-10-CM | POA: Diagnosis present

## 2022-01-20 DIAGNOSIS — R404 Transient alteration of awareness: Secondary | ICD-10-CM | POA: Diagnosis not present

## 2022-01-20 DIAGNOSIS — G309 Alzheimer's disease, unspecified: Secondary | ICD-10-CM | POA: Diagnosis present

## 2022-01-20 DIAGNOSIS — D500 Iron deficiency anemia secondary to blood loss (chronic): Secondary | ICD-10-CM | POA: Diagnosis present

## 2022-01-20 DIAGNOSIS — N1831 Chronic kidney disease, stage 3a: Secondary | ICD-10-CM | POA: Diagnosis present

## 2022-01-20 DIAGNOSIS — E1165 Type 2 diabetes mellitus with hyperglycemia: Secondary | ICD-10-CM | POA: Diagnosis present

## 2022-01-20 DIAGNOSIS — M1710 Unilateral primary osteoarthritis, unspecified knee: Secondary | ICD-10-CM | POA: Diagnosis present

## 2022-01-20 DIAGNOSIS — C3492 Malignant neoplasm of unspecified part of left bronchus or lung: Secondary | ICD-10-CM | POA: Diagnosis present

## 2022-01-20 DIAGNOSIS — F0280 Dementia in other diseases classified elsewhere without behavioral disturbance: Secondary | ICD-10-CM | POA: Diagnosis present

## 2022-01-20 DIAGNOSIS — G4730 Sleep apnea, unspecified: Secondary | ICD-10-CM | POA: Diagnosis present

## 2022-01-20 DIAGNOSIS — Z20822 Contact with and (suspected) exposure to covid-19: Secondary | ICD-10-CM | POA: Diagnosis present

## 2022-01-20 DIAGNOSIS — I1310 Hypertensive heart and chronic kidney disease without heart failure, with stage 1 through stage 4 chronic kidney disease, or unspecified chronic kidney disease: Secondary | ICD-10-CM | POA: Diagnosis present

## 2022-01-20 DIAGNOSIS — N400 Enlarged prostate without lower urinary tract symptoms: Secondary | ICD-10-CM | POA: Diagnosis present

## 2022-01-20 DIAGNOSIS — E1122 Type 2 diabetes mellitus with diabetic chronic kidney disease: Secondary | ICD-10-CM | POA: Diagnosis present

## 2022-01-20 DIAGNOSIS — E785 Hyperlipidemia, unspecified: Secondary | ICD-10-CM | POA: Diagnosis present

## 2022-01-20 DIAGNOSIS — I2510 Atherosclerotic heart disease of native coronary artery without angina pectoris: Secondary | ICD-10-CM | POA: Diagnosis present

## 2022-01-20 DIAGNOSIS — F0150 Vascular dementia without behavioral disturbance: Secondary | ICD-10-CM | POA: Diagnosis present

## 2022-01-20 DIAGNOSIS — K219 Gastro-esophageal reflux disease without esophagitis: Secondary | ICD-10-CM | POA: Diagnosis present

## 2022-01-20 DIAGNOSIS — J90 Pleural effusion, not elsewhere classified: Secondary | ICD-10-CM | POA: Diagnosis present

## 2022-01-20 DIAGNOSIS — R5381 Other malaise: Secondary | ICD-10-CM | POA: Diagnosis not present

## 2022-01-20 DIAGNOSIS — Z9852 Vasectomy status: Secondary | ICD-10-CM | POA: Diagnosis not present

## 2022-01-20 DIAGNOSIS — I252 Old myocardial infarction: Secondary | ICD-10-CM | POA: Diagnosis not present

## 2022-01-20 DIAGNOSIS — E872 Acidosis, unspecified: Secondary | ICD-10-CM | POA: Diagnosis present

## 2022-01-20 DIAGNOSIS — J8410 Pulmonary fibrosis, unspecified: Secondary | ICD-10-CM | POA: Diagnosis present

## 2022-01-20 DIAGNOSIS — Z743 Need for continuous supervision: Secondary | ICD-10-CM | POA: Diagnosis not present

## 2022-01-20 LAB — COMPREHENSIVE METABOLIC PANEL
ALT: 24 U/L (ref 0–44)
AST: 43 U/L — ABNORMAL HIGH (ref 15–41)
Albumin: 2.9 g/dL — ABNORMAL LOW (ref 3.5–5.0)
Alkaline Phosphatase: 57 U/L (ref 38–126)
Anion gap: 5 (ref 5–15)
BUN: 32 mg/dL — ABNORMAL HIGH (ref 8–23)
CO2: 24 mmol/L (ref 22–32)
Calcium: 8 mg/dL — ABNORMAL LOW (ref 8.9–10.3)
Chloride: 113 mmol/L — ABNORMAL HIGH (ref 98–111)
Creatinine, Ser: 1.24 mg/dL (ref 0.61–1.24)
GFR, Estimated: 55 mL/min — ABNORMAL LOW (ref >60)
Glucose, Bld: 197 mg/dL — ABNORMAL HIGH (ref 70–99)
Potassium: 3.7 mmol/L (ref 3.5–5.1)
Sodium: 142 mmol/L (ref 135–145)
Total Bilirubin: 0.7 mg/dL (ref 0.3–1.2)
Total Protein: 6.5 g/dL (ref 6.5–8.1)

## 2022-01-20 LAB — CBC WITH DIFFERENTIAL/PLATELET
Abs Immature Granulocytes: 0.08 10*3/uL — ABNORMAL HIGH (ref 0.00–0.07)
Basophils Absolute: 0 10*3/uL (ref 0.0–0.1)
Basophils Relative: 0 %
Eosinophils Absolute: 0.1 10*3/uL (ref 0.0–0.5)
Eosinophils Relative: 1 %
HCT: 25.8 % — ABNORMAL LOW (ref 39.0–52.0)
Hemoglobin: 7.6 g/dL — ABNORMAL LOW (ref 13.0–17.0)
Immature Granulocytes: 1 %
Lymphocytes Relative: 17 %
Lymphs Abs: 1.6 10*3/uL (ref 0.7–4.0)
MCH: 25.1 pg — ABNORMAL LOW (ref 26.0–34.0)
MCHC: 29.5 g/dL — ABNORMAL LOW (ref 30.0–36.0)
MCV: 85.1 fL (ref 80.0–100.0)
Monocytes Absolute: 0.9 10*3/uL (ref 0.1–1.0)
Monocytes Relative: 10 %
Neutro Abs: 6.9 10*3/uL (ref 1.7–7.7)
Neutrophils Relative %: 71 %
Platelets: 347 10*3/uL (ref 150–400)
RBC: 3.03 MIL/uL — ABNORMAL LOW (ref 4.22–5.81)
RDW: 17.6 % — ABNORMAL HIGH (ref 11.5–15.5)
WBC: 9.6 10*3/uL (ref 4.0–10.5)
nRBC: 1.2 % — ABNORMAL HIGH (ref 0.0–0.2)

## 2022-01-20 LAB — PROCALCITONIN
Procalcitonin: 0.36 ng/mL
Procalcitonin: 0.32 ng/mL

## 2022-01-20 LAB — GLUCOSE, CAPILLARY
Glucose-Capillary: 174 mg/dL — ABNORMAL HIGH (ref 70–99)
Glucose-Capillary: 175 mg/dL — ABNORMAL HIGH (ref 70–99)
Glucose-Capillary: 181 mg/dL — ABNORMAL HIGH (ref 70–99)
Glucose-Capillary: 186 mg/dL — ABNORMAL HIGH (ref 70–99)
Glucose-Capillary: 198 mg/dL — ABNORMAL HIGH (ref 70–99)
Glucose-Capillary: 207 mg/dL — ABNORMAL HIGH (ref 70–99)
Glucose-Capillary: 235 mg/dL — ABNORMAL HIGH (ref 70–99)

## 2022-01-20 LAB — LACTIC ACID, PLASMA: Lactic Acid, Venous: 1.9 mmol/L (ref 0.5–1.9)

## 2022-01-20 LAB — MAGNESIUM: Magnesium: 2.3 mg/dL (ref 1.7–2.4)

## 2022-01-20 LAB — MRSA NEXT GEN BY PCR, NASAL: MRSA by PCR Next Gen: NOT DETECTED

## 2022-01-20 MED ORDER — INSULIN ASPART 100 UNIT/ML IJ SOLN
0.0000 [IU] | Freq: Three times a day (TID) | INTRAMUSCULAR | Status: DC
  Administered 2022-01-20: 3 [IU] via SUBCUTANEOUS
  Administered 2022-01-20: 5 [IU] via SUBCUTANEOUS
  Administered 2022-01-20 – 2022-01-21 (×2): 3 [IU] via SUBCUTANEOUS
  Administered 2022-01-21: 5 [IU] via SUBCUTANEOUS
  Administered 2022-01-21 – 2022-01-23 (×5): 3 [IU] via SUBCUTANEOUS
  Filled 2022-01-20 (×10): qty 1

## 2022-01-20 MED ORDER — ONDANSETRON HCL 4 MG PO TABS: 4.0000 mg | ORAL_TABLET | Freq: Four times a day (QID) | ORAL | Status: DC | PRN

## 2022-01-20 MED ORDER — DOCUSATE SODIUM 100 MG PO CAPS
100.0000 mg | ORAL_CAPSULE | Freq: Every day | ORAL | Status: DC
  Administered 2022-01-20 – 2022-01-22 (×3): 100 mg via ORAL
  Filled 2022-01-20 (×3): qty 1

## 2022-01-20 MED ORDER — BISACODYL 10 MG RE SUPP: 10.0000 mg | Freq: Every day | RECTAL | Status: DC | PRN

## 2022-01-20 MED ORDER — ACETAMINOPHEN 325 MG PO TABS: 650.0000 mg | ORAL_TABLET | Freq: Four times a day (QID) | ORAL | Status: DC | PRN

## 2022-01-20 MED ORDER — CLONAZEPAM 0.5 MG PO TABS
1.0000 mg | ORAL_TABLET | Freq: Every day | ORAL | Status: DC
  Administered 2022-01-20 – 2022-01-22 (×3): 1 mg via ORAL
  Filled 2022-01-20 (×3): qty 2

## 2022-01-20 MED ORDER — ONDANSETRON HCL 4 MG/2ML IJ SOLN
4.0000 mg | Freq: Four times a day (QID) | INTRAMUSCULAR | Status: DC | PRN
Start: 2022-01-20 — End: 2022-01-23

## 2022-01-20 MED ORDER — MEMANTINE HCL 5 MG PO TABS
10.0000 mg | ORAL_TABLET | Freq: Two times a day (BID) | ORAL | Status: DC
  Administered 2022-01-20 – 2022-01-23 (×6): 10 mg via ORAL
  Filled 2022-01-20 (×6): qty 2

## 2022-01-20 MED ORDER — ACETAMINOPHEN 650 MG RE SUPP: 650.0000 mg | Freq: Four times a day (QID) | RECTAL | Status: DC | PRN

## 2022-01-20 MED ORDER — FINASTERIDE 5 MG PO TABS
5.0000 mg | ORAL_TABLET | Freq: Every day | ORAL | Status: DC
  Administered 2022-01-20 – 2022-01-23 (×4): 5 mg via ORAL
  Filled 2022-01-20 (×4): qty 1

## 2022-01-20 MED ORDER — SODIUM FLUORIDE 1.1 % DT CREA: 1.0000 "application " | TOPICAL_CREAM | Freq: Every evening | DENTAL | Status: DC

## 2022-01-20 NOTE — Assessment & Plan Note (Addendum)
Continue home bowel regimen ?

## 2022-01-20 NOTE — Hospital Course (Signed)
Repeat56 year old white male with a history of stroke, vascular dementia, Alzheimer's dementia, untreated left lung cancer, CKD stage IIIa, type 2 diabetes currently lives at memory care assisted living.  On admission, patient's significant other ported that over the last 2 weeks, patient has had progressive fatigue and generalized weakness with poor appetite and declining oral intake.  She denied the patient having any black or tarry stools.   ? ?Evaluation in the ED revealed significant anemia with hemoglobin of 6.9.  He was admitted to the hospital and transfused 1 unit of packed red cells. ? ?CODE STATUS and goals of care were discussed at time of admission.  Patient is DNR/DNI.  Significant other declined invasive or aggressive measures including colonoscopy or EGD to evaluate for GI blood loss, citing patient's advanced age, dementia and declining quality of life in general. ?

## 2022-01-20 NOTE — Progress Notes (Addendum)
?Progress Note ? ? ?PatientJABRIEL [name] NOB:[id number] DOB: [birth year] DOA: 01/19/2022     0 ?DOS: the patient was seen and examined on 01/20/2022 ?  ?Brief hospital course: ?Repeat42 year old white male with a history of stroke, vascular dementia, Alzheimer's dementia, untreated left lung cancer, CKD stage IIIa, type 2 diabetes currently lives at memory care assisted living.  On admission, patient's significant other ported that over the last 2 weeks, patient has had progressive fatigue and generalized weakness with poor appetite and declining oral intake.  She denied the patient having any black or tarry stools.   ? ?Evaluation in the ED revealed significant anemia with hemoglobin of 6.9.  He was admitted to the hospital and transfused 1 unit of packed red cells. ? ?CODE STATUS and goals of care were discussed at time of admission.  Patient is DNR/DNI.  Significant other declined invasive or aggressive measures including colonoscopy or EGD to evaluate for GI blood loss, citing patient's advanced age, dementia and declining quality of life in general. ? ?Assessment and Plan: ?* Symptomatic anemia ?Dr. Bridgett Larsson discussed with the patient's significant other Stephen [name] on admission: no aggressive measures or invasive procedures, declined any GI work-up including colonoscopy and/or EGD.  She was agreeable with blood transfusion.  ? ?"Discussed the possibility of him having GI cancer, metastatic disease from his lung cancer.  She is aware of this.  She states the patient has poor quality of life.  However she is not ready to make him comfort care just yet.  We will continue DNR status.  1 unit packed red blood cell transfusion ordered by the ER.  Repeat CBC in the morning." ? ?Lung cancer ([place]) - left side. untreated. ?Patient has a malignant left lung nodule.  Stephen [name], patient's significant other states that this is not being treated. ? ?CT chest on admission showed enlarging mass in the left lingula measuring up to 2.8 x  4.2 cm, likely malignant; also with liver findings suspicious for metastatic disease, peripheral interstitial pattern bilaterally suggestive of usual interstitial pneumonia, likely alveolitis; emphysematous changes. ? ?Of note, patient had a PET scan in October 2020 that showed his left lung nodule concerning for bronchogenic carcinoma.  He also had distal esophageal hyperactivity.  They are also concerned about esophageal cancer.  Patient never had a work-up for this. ? ?Vascular dementia ([place]) ?Chronic.  Significant other also states he has Alzheimer's type dementia.  Patient sometimes is able to recognize his significant other, but not oriented x3. ?-- Continue home Namenda ? ?Stage 3a chronic kidney disease (CKD) ([place]) ?Chronic. ? ?DNR (do not resuscitate)/DNI(Do Not Intubate) ?Discussed with the patient's significant other Stephen [name] on admission.   ?DNR/DNI status. ?Yellow DNR form in chart. ? ? ? ?Hiatal hernia ?Chest x-ray on admission showed possible right sided pleural effusion versus hiatal hernia.  Subsequent chest CT consistent with moderate hiatal hernia and no pleural effusion seen. ? ?Pulmonary interstitial fibrosis ([place]) ?Respiratory status is stable.   ?Not on home O2. ? ?Chest CT on admission with diffuse interstitial pattern mostly peripherally distributed including areas of honeycomb formation and groundglass alveolar changes.  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 ? ?Controlled type 2 diabetes mellitus with stage 3 chronic kidney disease, without long-term current use of insulin ([place]) ?Hold metformin.  ?Sliding scale NovoLog. ? ?Constipation ?Resume home bowel regimen ? ? ? ? ?  ? ?Subjective: Patient was sleeping but woke easily to voice when seen this morning.  No family present at  bedside during my encounter.  Patient denied having pain or other symptoms currently bothering him.  No acute events reported.  No reports of any bleeding. ? ?Physical  Exam: ?Vitals:  ? 01/20/22 0335 01/20/22 0452 01/20/22 0810 01/20/22 1210  ?BP: 115/68  117/69 119/70  ?Pulse: 100  91 91  ?Resp: 18  18 18   ?Temp: 99.4 ?F (37.4 ?C)  97.6 ?F (36.4 ?C) 97.6 ?F (36.4 ?C)  ?TempSrc: Oral Oral    ?SpO2: 97%  98% 99%  ?Weight: 90.9 kg     ? ?General exam: Sleeping, woke easily to voice, no acute distress ?HEENT: Pale conjunctiva, moist mucus membranes, hearing grossly normal  ?Respiratory system: CTAB, no wheezes, rales or rhonchi, normal respiratory effort. ?Cardiovascular system: normal S1/S2, RRR, no pedal edema.   ?Gastrointestinal system: soft, NT, ND, no HSM felt, +bowel sounds. ?Central nervous system: Oriented to self. no gross focal neurologic deficits, normal speech ?Extremities: no edema, normal tone ?Skin: dry, intact, normal temperature ?Psychiatry: normal mood, congruent affect, abnormal judgment and insight due to dementia ? ? ? ?Data Reviewed: ? ?Labs reviewed and notable for Hemoglobin 7.6, procalcitonin 0.32 down slightly, chloride 113, glucose 197, BUN 32, calcium 8.0, albumin 2.9, AST 43 ? ?Family Communication: Left voicemail for parents significant other Stephen [name].  Will attempt to reach again this afternoon to continue goals of care discussions. ? ?Disposition: ?Status is: Inpatient ?Remains inpatient appropriate because: Symptomatic anemia requiring blood transfusion, close monitoring ongoing ? ? Planned Discharge Destination:  ALF/memory care, possibly with hospice ? ? ? ?Time spent: 40 minutes ? ?Author: ?Ezekiel Slocumb, DO ?01/20/2022 12:35 PM ? ?For on call review [email or web address].  ?

## 2022-01-20 NOTE — Plan of Care (Signed)

## 2022-01-21 DIAGNOSIS — D509 Iron deficiency anemia, unspecified: Secondary | ICD-10-CM | POA: Diagnosis present

## 2022-01-21 LAB — BASIC METABOLIC PANEL
Anion gap: 5 (ref 5–15)
BUN: 23 mg/dL (ref 8–23)
CO2: 24 mmol/L (ref 22–32)
Calcium: 7.8 mg/dL — ABNORMAL LOW (ref 8.9–10.3)
Chloride: 114 mmol/L — ABNORMAL HIGH (ref 98–111)
Creatinine, Ser: 1 mg/dL (ref 0.61–1.24)
GFR, Estimated: >60 mL/min (ref >60)
Glucose, Bld: 178 mg/dL — ABNORMAL HIGH (ref 70–99)
Potassium: 3.9 mmol/L (ref 3.5–5.1)
Sodium: 143 mmol/L (ref 135–145)

## 2022-01-21 LAB — CBC
HCT: 23.7 % — ABNORMAL LOW (ref 39.0–52.0)
Hemoglobin: 7 g/dL — ABNORMAL LOW (ref 13.0–17.0)
MCH: 25.1 pg — ABNORMAL LOW (ref 26.0–34.0)
MCHC: 29.5 g/dL — ABNORMAL LOW (ref 30.0–36.0)
MCV: 84.9 fL (ref 80.0–100.0)
Platelets: 295 10*3/uL (ref 150–400)
RBC: 2.79 MIL/uL — ABNORMAL LOW (ref 4.22–5.81)
RDW: 17.8 % — ABNORMAL HIGH (ref 11.5–15.5)
WBC: 10 10*3/uL (ref 4.0–10.5)
nRBC: 0.8 % — ABNORMAL HIGH (ref 0.0–0.2)

## 2022-01-21 LAB — PROCALCITONIN: Procalcitonin: 0.13 ng/mL

## 2022-01-21 LAB — GLUCOSE, CAPILLARY
Glucose-Capillary: 163 mg/dL — ABNORMAL HIGH (ref 70–99)
Glucose-Capillary: 188 mg/dL — ABNORMAL HIGH (ref 70–99)
Glucose-Capillary: 209 mg/dL — ABNORMAL HIGH (ref 70–99)
Glucose-Capillary: 182 mg/dL — ABNORMAL HIGH (ref 70–99)
Glucose-Capillary: 218 mg/dL — ABNORMAL HIGH (ref 70–99)

## 2022-01-21 LAB — PREPARE RBC (CROSSMATCH)

## 2022-01-21 LAB — TYPE AND SCREEN
ABO/RH(D): O POS
Antibody Screen: NEGATIVE

## 2022-01-21 MED ORDER — SODIUM CHLORIDE 0.9% IV SOLUTION: Freq: Once | INTRAVENOUS | Status: DC

## 2022-01-21 MED ORDER — POLYETHYLENE GLYCOL 3350 17 G PO PACK
8.5000 g | PACK | Freq: Every day | ORAL | Status: DC
  Administered 2022-01-21 – 2022-01-22 (×2): 8.5 g via ORAL
  Filled 2022-01-21 (×2): qty 1

## 2022-01-21 MED ORDER — POLYETHYLENE GLYCOL 3350 17 G PO PACK: 17.0000 g | PACK | Freq: Every day | ORAL | Status: DC

## 2022-01-21 MED ORDER — PANTOPRAZOLE SODIUM 40 MG PO TBEC
40.0000 mg | DELAYED_RELEASE_TABLET | Freq: Every day | ORAL | Status: DC
  Administered 2022-01-21 – 2022-01-22 (×2): 40 mg via ORAL
  Filled 2022-01-21 (×2): qty 1

## 2022-01-21 NOTE — Plan of Care (Signed)

## 2022-01-21 NOTE — Assessment & Plan Note (Addendum)
Iron studies: iron 20, normal TIBC 413, low sat ratio 5%.  Suspect chronic occult blood loss vs dietary etiology. --Iron infusion today -- Start oral iron supplement with breakfast tomorrow --Follow CBC

## 2022-01-21 NOTE — Evaluation (Signed)
Physical Therapy Evaluation ?Patient Details ?Name: Stephen [name] ?MRN: [id number] ?DOB: 1927-02-19 ?Today's Date: 01/21/2022 ? ?History of Present Illness ? Patient is a 88 year old male w/ a PMH (+) for stroke, vascular dementia, Alzheimer's dementia, untreated left lung cancer, CKD stage IIIa, and type 2 diabetes. Patient currently lives in memory care. Presented to [facility] with increasing fatigue. Admitted for Symptomatic anemia. ?  ?Clinical Impression ? Physical Therapy Evaluation completed on this date. Patient tolerated session well and was agreeable to treatment. Upon entry into room patient was supine in bed watching TV with wife Stephen [name]) present. Wife stated that patient has been Mod I with his rollator, however in the past 2 weeks has had difficulty performing sit to stands. Patient requires assistance with ADLs such as bathing and feeding. Patient came from [facility], and per wife Stephen [name], plan is to return to Memory Care Unit or LTC portion of [facility]. No pain reported from patient throughout session.  ? ?Patient demonstrated generalized weakness in BLEs (at least 3-3+/5 strength), however this seems to be patient's baseline. Patient was able to complete supine<>sitting transfer at SBA/SUP with increased time and effort, and cueing to scoot anteriorly to EOB. CGA-SBA was required from elevated surface to complete sit to stand from EOB. Increased time required to come to full standing, and mild unsteadiness noted. With max encouragement, patient was able to ambulate ~95 feet with RW at [facility]. No LOB noted, however mild-moderate unsteadiness noted. Patient was returned to bed with all needs met. Patient would continue to benefit from skilled physical therapy in order to optimize patient's independence with bed mobility, functional transfers, and ambulation, as well as decrease caregiver burden. Recommend HHPT upon return to [facility].  ?   ? ?Recommendations for follow up therapy are one  component of a multi-disciplinary discharge planning process, led by the attending physician.  Recommendations may be updated based on patient status, additional functional criteria and insurance authorization. ? ?Follow Up Recommendations Home health PT ? ?  ?Assistance Recommended at Discharge Frequent or constant Supervision/Assistance  ?Patient can return home with the following ? A little help with walking and/or transfers;Help with stairs or ramp for entrance;Assist for transportation ? ?  ?Equipment Recommendations None recommended by PT  ?Recommendations for Other Services ?    ?  ?Functional Status Assessment Patient has had a recent decline in their functional status and demonstrates the ability to make significant improvements in function in a reasonable and predictable amount of time.  ? ?  ?Precautions / Restrictions Precautions ?Precautions: Fall ?Restrictions ?Weight Bearing Restrictions: No  ? ?  ? ?Mobility ? Bed Mobility ?Overal bed mobility: Needs Assistance ?Bed Mobility: Supine to Sit, Sit to Supine ?  ?  ?Supine to sit: Supervision (with increased time and effort) ?Sit to supine: Supervision (with increased time and effort) ?  ?General bed mobility comments: Max A to pull patient up in bed via chuck pad ?Patient Response: Cooperative ? ?Transfers ?Overall transfer level: Needs assistance ?Equipment used: Rolling walker (2 wheels) ?Transfers: Sit to/from Stand ?Sit to Stand: Min guard, From elevated surface (SBA) ?  ?  ?  ?  ?  ?General transfer comment: Increased time to come to standing ?  ? ?Ambulation/Gait ?Ambulation/Gait assistance: Min guard ?Gait Distance (Feet): 95 Feet ?Assistive device: Rolling walker (2 wheels) ?Gait Pattern/deviations: Step-through pattern, Decreased step length - right, Decreased step length - left, Decreased stride length, Narrow base of support, Trunk flexed ?Gait velocity:  decreased ?  ?  ?General Gait Details: milkd-moderate unsteadiness with  walking ? ?Stairs ?  ?  ?  ?  ?  ? ?Wheelchair Mobility ?  ? ?Modified Rankin (Stroke Patients Only) ?  ? ?  ? ?Balance Overall balance assessment: Needs assistance ?Sitting-balance support: Bilateral upper extremity supported ?Sitting balance-Leahy Scale: Fair ?  ?  ?Standing balance support: Bilateral upper extremity supported, During functional activity, Reliant on assistive device for balance ?Standing balance-Leahy Scale: Fair ?Standing balance comment: heavily reliant on AD ?  ?  ?  ?  ?  ?  ?  ?  ?  ?  ?  ?   ? ? ? ?Pertinent Vitals/Pain Pain Assessment ?Pain Assessment: No/denies pain  ? ? ?Home Living Family/patient expects to be discharged to:: Assisted living ?  ?  ?  ?  ?  ?  ?  ?  ?Home Equipment: Rollator (4 wheels) ?Additional Comments: Memory care at [facility]  ?  ?Prior Function Prior Level of Function : Needs assist ? Cognitive Assist : Mobility (cognitive) ?  ?  ?Physical Assist : Mobility (physical) ?Mobility (physical): Bed mobility;Transfers ?  ?Mobility Comments: Rollator for short distances, hx of falls, at least 3 in the last 2 months ?ADLs Comments: Assistance with showering ?  ? ? ?Hand Dominance  ? Dominant Hand: Right ? ?  ?Extremity/Trunk Assessment  ? Upper Extremity Assessment ?Upper Extremity Assessment: Generalized weakness (at least 4-/5 strength bilaterally) ?  ? ?Lower Extremity Assessment ?Lower Extremity Assessment: Generalized weakness (at least 3+/5 strength bilaterally) ?  ? ?   ?Communication  ? Communication: HOH  ?Cognition Arousal/Alertness: Awake/alert ?Behavior During Therapy: [facility] for tasks assessed/performed ?Overall Cognitive Status: History of cognitive impairments - at baseline ?  ?  ?  ?  ?  ?  ?  ?  ?  ?  ?  ?  ?  ?  ?  ?  ?  ?  ?  ? ?  ?General Comments General comments (skin integrity, edema, etc.): HR ranged from 103-114bpm throughout session, SpO2 remained >90% ? ?  ?Exercises Other Exercises ?Other Exercises: patient educated on role of PT in acute care  setting, fall risk  ? ?Assessment/Plan  ?  ?PT Assessment Patient needs continued PT services  ?PT Problem List Decreased strength;Decreased mobility;Decreased safety awareness;Decreased activity tolerance;Decreased balance ? ?   ?  ?PT Treatment Interventions DME instruction;Gait training;Therapeutic exercise;Balance training;Functional mobility training;Patient/family education;Therapeutic activities;Cognitive remediation   ? ?PT Goals (Current goals can be found in the Care Plan section)  ?Acute Rehab PT Goals ?Patient Stated Goal: to return to [facility] (either memory care of LTC via wife Stephen [name]) ?PT Goal Formulation: With patient/family ?Time For Goal Achievement: 02/04/22 ?Potential to Achieve Goals: Good ? ?  ?Frequency Min 2X/week ?  ? ? ?Co-evaluation   ?  ?  ?  ?  ? ? ?  ?AM-PAC PT "6 Clicks" Mobility  ?Outcome Measure Help needed turning from your back to your side while in a flat bed without using bedrails?: A Little ?Help needed moving from lying on your back to sitting on the side of a flat bed without using bedrails?: A Little ?Help needed moving to and from a bed to a chair (including a wheelchair)?: A Little ?Help needed standing up from a chair using your arms (e.g., wheelchair or bedside chair)?: A Little ?Help needed to walk in hospital room?: A Little ?Help needed climbing 3-5 steps with a railing? : A  Lot ?6 Click Score: 17 ? ?  ?End of Session Equipment Utilized During Treatment: Gait belt ?Activity Tolerance: Patient tolerated treatment well ?Patient left: in bed;with call bell/phone within reach;with bed alarm set ?Nurse Communication: Mobility status ?PT Visit Diagnosis: Unsteadiness on feet (R26.81);Muscle weakness (generalized) (M62.81);Repeated falls (R29.6);History of falling (Z91.81);Difficulty in walking, not elsewhere classified (R26.2) ?  ? ?Time: 9741-6384 ?PT Time Calculation (min) (ACUTE ONLY): 24 min ? ? ?Charges:   PT Evaluation ?$PT Eval Low Complexity: 1 Low ?  ?  ?    ? ? ?Iva Boop, PT  ?01/21/22. 12:58 PM ? ? ?

## 2022-01-21 NOTE — Progress Notes (Signed)
Manufacturing engineer (ACC) ? ?Referral received for hospice services at [facility] once Stephen [name] is ready to discharge. ? ?Spoke with Stephen [name], SO and Stephen [name], she confirms this plan.  ? ?Stephen [name] is from the memory care unit and may discharge back to skilled nursing side. Stephen [name] said she will not know which side he will discharge to until Monday.  ? ?No DME needed if he returns to skilled nursing side (likely where he will d/c to). ? ?Support provided, questions answered. ? ?Once ready to discharge, please send completed DNR with patient. Please also arrange for any comfort meds, if indicated, so there is no lapse in any symptoms prior to hospice arriving to begin services. ? ?Thank you, ?Venia Carbon BSN, RN ?[facility] Liaison  ?

## 2022-01-21 NOTE — Progress Notes (Signed)
?Progress Note ? ? ?PatientDELAWRENCE [name] UVO:[id number] DOB: [birth year] DOA: 01/19/2022     1 ?DOS: the patient was seen and examined on 01/21/2022 ?  ?Brief hospital course: ?Repeat52 year old white male with a history of stroke, vascular dementia, Alzheimer's dementia, untreated left lung cancer, CKD stage IIIa, type 2 diabetes currently lives at memory care assisted living.  On admission, patient's significant other ported that over the last 2 weeks, patient has had progressive fatigue and generalized weakness with poor appetite and declining oral intake.  She denied the patient having any black or tarry stools.   ? ?Evaluation in the ED revealed significant anemia with hemoglobin of 6.9.  He was admitted to the hospital and transfused 1 unit of packed red cells. ? ?CODE STATUS and goals of care were discussed at time of admission.  Patient is DNR/DNI.  Significant other declined invasive or aggressive measures including colonoscopy or EGD to evaluate for GI blood loss, citing patient's advanced age, dementia and declining quality of life in general. ? ?Assessment and Plan: ?* Symptomatic anemia ?Dr. Bridgett Larsson discussed with the patient's significant other Stephen [name] on admission: no aggressive measures or invasive procedures, declined any GI work-up including colonoscopy and/or EGD.  She was agreeable with blood transfusion.  ? ?"Discussed the possibility of him having GI cancer, metastatic disease from his lung cancer.  She is aware of this.  She states the patient has poor quality of life.  However she is not ready to make him comfort care just yet.  We will continue DNR status.  1 unit packed red blood cell transfusion ordered by the ER.  Repeat CBC in the morning." ? ?Hbg 7.0 this AM ?--Transfuse 1 units pRBC's ?--Trend Hbg & transfuse if <= 7.0 ?--Iron transfusion & d/c on oral iron supplement ? ?Lung cancer ([place]) - left side. untreated. ?Patient has a malignant left lung nodule.  Stephen [name], patient's significant  other states that this is not being treated. ? 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 ? ?Of note, patient had a PET scan in October 2020 that showed his left lung nodule concerning for bronchogenic carcinoma.  He also had distal esophageal hyperactivity.  They are also concerned about esophageal cancer.  Patient never had a work-up for this. ? ?Vascular dementia ([place]) ?Chronic.  Significant other also states he has Alzheimer's type dementia.  Patient sometimes is able to recognize his significant other, but not oriented x3. ?-- Continue home Namenda ? ?Stage 3a chronic kidney disease (CKD) ([place]) ?Chronic. ? ?DNR (do not resuscitate)/DNI(Do Not Intubate) ?Discussed with the patient's significant other Stephen [name] on admission.   ?DNR/DNI status. ?Yellow DNR form in chart. ? ? ? ?Hiatal hernia ?Chest x-ray on admission showed possible right sided pleural effusion versus hiatal hernia.  Subsequent chest CT consistent with moderate hiatal hernia and no pleural effusion seen. ? ?Pulmonary interstitial fibrosis ([place]) ?Respiratory status is stable, no dyspnea at this time.  Not on home O2.  On room air here. ? ?Chest CT on admission with diffuse interstitial pattern mostly peripherally distributed including areas of honeycomb formation and groundglass alveolar changes.  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 ? ?Controlled type 2 diabetes mellitus with stage 3 chronic kidney disease, without long-term current use of insulin ([place]) ?Hold metformin.  ?Sliding  scale NovoLog. ? ?Iron deficiency anemia ?Iron studies: iron 20, normal TIBC 413, low sat ratio 5%.  Suspect chronic occult blood loss vs dietary etiology. ?--Iron  infusion ?--D/c on oral iron supplement ?--Follow CBC ? ?Constipation ?Continue home bowel regimen ? ? ? ? ?  ? ?Subjective: Patient was awake sitting up in bed when seen this morning.  He was requesting a drink of water.  He is more alert and talkative today.  Denies having any pain right now and denies feeling sick.  Says he is breathing is okay.  No other acute complaints or acute events reported. ? ?Physical Exam: ?Vitals:  ? 01/21/22 0503 01/21/22 0512 01/21/22 0750 01/21/22 1124  ?BP: 109/63  121/64 103/63  ?Pulse: 85  88 88  ?Resp: 16 19 16 17   ?Temp: (!) 97.5 ?F (36.4 ?C)  97.6 ?F (36.4 ?C) (!) 97.3 ?F (36.3 ?C)  ?TempSrc: Oral  Oral   ?SpO2: 96%  96% 96%  ?Weight:      ? ?General exam: awake, alert, no acute distress ?HEENT: moist mucus membranes, hearing grossly normal  ?Respiratory system: CTAB, no wheezes, rales or rhonchi, normal respiratory effort. ?Cardiovascular system: normal S1/S2, RRR, no JVD, murmurs, rubs, gallops, no pedal edema.   ?Gastrointestinal system: soft, NT, ND, no HSM felt, +bowel sounds. ?Central nervous system: Exam limited by patient's dementia, no gross focal neurologic deficits, normal speech ?Extremities: moves all, no edema, normal tone ?Skin: dry, intact, normal temperature ?Psychiatry: normal mood, congruent affect ? ? ? ?Data Reviewed: ? ?Labs reviewed and notable for BMP with chloride 114, glucose 178, calcium 7.8, procalcitonin trended downward to 0.13, hemoglobin 7.0 ? ?Family Communication: Spouse Stephen [name] updated at bedside today ? ?Disposition: ?Status is: Inpatient ?Remains inpatient appropriate because: Severity of illness with anemia requiring blood transfusions.  Anticipate discharge back to ALF/memory care or SNF if needed with hospice to follow.  PT OT evaluations pending ? ? ? Planned Discharge Destination:  Return to ALF/memory care versus SNF with hospice ? ? ? ?Time spent: 40 minutes ? ?Author: ?Ezekiel Slocumb, DO ?01/21/2022 12:46 PM ? ?For on call review  [email or web address].  ?

## 2022-01-21 NOTE — TOC Initial Note (Addendum)
Transition of Care (TOC) - Initial/Assessment Note  ? ? ?Patient Details  ?Name: Stephen [name] ?MRN: [id number] ?Date of Birth: Apr 10, 1927 ? ?Transition of Care (TOC) CM/SW Contact:    ?Ricki Vanhandel E Ziaire Hagos, LCSW ?Phone Number: ?01/21/2022, 12:21 PM ? ?Clinical Narrative:             Patient is from [facility] in their Memory Care Unit. ?Per Dr Arbutus Ped, patient will DC back to [facility] with Hospice care- need to determine if patient will need ALF or SNF level of care. PT/OT eval pending. ?Left VM for patient's significant other. ?Confirmed with Seth Bake at [facility] that patient can come back to either level of care when medically ready (likely Monday or Tuesday). Seth Bake reported they use [facility] for hospice services at [facility]. ?Referral has been made to [facility] with [facility] who was made aware of the above.  ? ? ?Expected Discharge Plan: [place] (ALF vs SNF) ?Barriers to Discharge: Continued Medical Work up ? ? ?Patient Goals and CMS Choice ?  ?CMS Medicare.gov Compare Post Acute Care list provided to:: Patient Represenative (must comment) ?Choice offered to / list presented to : Spouse ? ?Expected Discharge Plan and Services ?Expected Discharge Plan: [place] (ALF vs SNF) ?  ?  ?  ?Living arrangements for the past 2 months: [place] ?                ?  ?  ?  ?  ?  ?  ?  ?  ?  ?  ? ?Prior Living Arrangements/Services ?Living arrangements for the past 2 months: [place] ?Lives with:: Facility Resident ?Patient language and need for interpreter reviewed:: Yes ?       ?Need for Family Participation in Patient Care: Yes (Comment) ?Care giver support system in place?: Yes (comment) ?  ?Criminal Activity/Legal Involvement Pertinent to Current Situation/Hospitalization: No - Comment as needed ? ?Activities of Daily Living ?Home Assistive Devices/Equipment: Dentures (specify type) ?ADL Screening (condition at time of admission) ?Patient's cognitive  ability adequate to safely complete daily activities?: No ?Is the patient deaf or have difficulty hearing?: No ?Does the patient have difficulty seeing, even when wearing glasses/contacts?: No ?Does the patient have difficulty concentrating, remembering, or making decisions?: Yes ?Patient able to express need for assistance with ADLs?: Yes ?Does the patient have difficulty dressing or bathing?: Yes ?Independently performs ADLs?: No ?Communication: Appropriate for developmental age ?Dressing (OT): Needs assistance ?Is this a change from baseline?: Pre-admission baseline ?Grooming: Needs assistance ?Is this a change from baseline?: Pre-admission baseline ?Feeding: Needs assistance ?Is this a change from baseline?: Pre-admission baseline ?Bathing: Dependent ?Is this a change from baseline?: Pre-admission baseline ?Toileting: Dependent ?Is this a change from baseline?: Pre-admission baseline ?In/Out Bed: Needs assistance ?Is this a change from baseline?: Pre-admission baseline ?Walks in Home: Needs assistance ?Is this a change from baseline?: Pre-admission baseline ?Does the patient have difficulty walking or climbing stairs?: Yes ?Weakness of Legs: Both ?Weakness of Arms/Hands: None ? ?Permission Sought/Granted ?  ?  ?   ?   ?   ?   ? ?Emotional Assessment ?  ?  ?  ?Orientation: : Fluctuating Orientation (Suspected and/or reported Sundowners) ?Alcohol / Substance Use: Not Applicable ?Psych Involvement: No (comment) ? ?Admission diagnosis:  Pleural effusion [J90] ?Lactic acid acidosis [E87.20] ?SIRS (systemic inflammatory response syndrome) (HCC) [R65.10] ?Symptomatic anemia [D64.9] ?Anemia, unspecified type [D64.9] ?Patient Active Problem List  ? Diagnosis Date  Noted  ? Symptomatic anemia 01/19/2022  ? Vascular dementia ([place]) 01/19/2022  ? Lung cancer ([place]) - left side. untreated. 01/19/2022  ? Stage 3a chronic kidney disease (CKD) ([place]) 06/02/2019  ? Sundowning 06/02/2019  ? Pulmonary nodule 06/02/2019  ? DNR (do not  resuscitate)/DNI(Do Not Intubate) 05/05/2018  ? Chest pain 04/27/2018  ? Carotid stenosis, symptomatic, with infarction ([place]) 03/30/2018  ? Aphasia due to acute stroke ([place]) 03/30/2018  ? History of ischemic left MCA stroke 03/15/2018  ? Systolic murmur 74/10/8785  ? Complex sleep apnea syndrome 11/23/2017  ? PLMD (periodic limb movement disorder) 11/23/2017  ? Mixed Alzheimer's and vascular dementia ([place]) 12/17/2016  ? Paresthesias 12/12/2015  ? Anemia, unspecified 12/12/2015  ? Hiatal hernia 05/23/2015  ? Pulmonary interstitial fibrosis ([place]) 02/15/2015  ? Hepatic lesion 02/15/2015  ? Abnormal barium swallow 01/25/2015  ? Dysphagia 01/07/2015  ? Advanced care planning/counseling discussion 11/29/2014  ? Constipation 03/31/2014  ? Coronary artery disease 03/12/2014  ? Medicare annual wellness visit, subsequent 11/27/2013  ? Hyperlipidemia associated with type 2 diabetes mellitus ([place]) 11/27/2013  ? Insomnia   ? Controlled type 2 diabetes mellitus with stage 3 chronic kidney disease, without long-term current use of insulin ([place])   ? Benign prostatic hyperplasia   ? Rosacea   ? History of colon polyps   ? ?PCP:  Venia Carbon, MD ?Pharmacy:   ?[facility] [address] ?[place] [address] ?Phone: [phone] Fax: [phone] ? ? ? ? ?Social Determinants of Health (SDOH) Interventions ?  ? ?Readmission Risk Interventions ?Readmission Risk Prevention Plan 01/21/2022  ?Transportation Screening Complete  ?PCP or Specialist Appt within 5-7 Days Complete  ?Home Care Screening Complete  ?Medication Review (RN CM) Complete  ?Some recent data might be hidden  ? ? ? ?

## 2022-01-22 LAB — GLUCOSE, CAPILLARY
Glucose-Capillary: 169 mg/dL — ABNORMAL HIGH (ref 70–99)
Glucose-Capillary: 179 mg/dL — ABNORMAL HIGH (ref 70–99)
Glucose-Capillary: 188 mg/dL — ABNORMAL HIGH (ref 70–99)
Glucose-Capillary: 167 mg/dL — ABNORMAL HIGH (ref 70–99)

## 2022-01-22 LAB — HEMOGLOBIN AND HEMATOCRIT, BLOOD
HCT: 27 % — ABNORMAL LOW (ref 39.0–52.0)
HCT: 29.8 % — ABNORMAL LOW (ref 39.0–52.0)
Hemoglobin: 8 g/dL — ABNORMAL LOW (ref 13.0–17.0)
Hemoglobin: 8.8 g/dL — ABNORMAL LOW (ref 13.0–17.0)

## 2022-01-22 LAB — BPAM RBC
Blood Product Expiration Date: 202303212359
Blood Product Expiration Date: 202304102359
ISSUE DATE / TIME: 202303110112
ISSUE DATE / TIME: 202303121757
Unit Type and Rh: 5100
Unit Type and Rh: 5100

## 2022-01-22 LAB — TYPE AND SCREEN
ABO/RH(D): O POS
Antibody Screen: NEGATIVE
Unit division: 0
Unit division: 0

## 2022-01-22 LAB — HEMOGLOBIN A1C
Hgb A1c MFr Bld: 8.9 % — ABNORMAL HIGH (ref 4.8–5.6)
Mean Plasma Glucose: 209 mg/dL

## 2022-01-22 LAB — RESP PANEL BY RT-PCR (FLU A&B, COVID) ARPGX2
Influenza A by PCR: NEGATIVE
Influenza B by PCR: NEGATIVE
SARS Coronavirus 2 by RT PCR: NEGATIVE

## 2022-01-22 MED ORDER — SODIUM CHLORIDE 0.9 % IV SOLN
500.0000 mg | Freq: Once | INTRAVENOUS | Status: AC
  Administered 2022-01-22: 500 mg via INTRAVENOUS
  Filled 2022-01-22: qty 25

## 2022-01-22 MED ORDER — FERROUS SULFATE 325 (65 FE) MG PO TABS
325.0000 mg | ORAL_TABLET | Freq: Every day | ORAL | Status: DC
  Administered 2022-01-23: 325 mg via ORAL
  Filled 2022-01-22: qty 1

## 2022-01-22 MED ORDER — PANTOPRAZOLE SODIUM 40 MG PO TBEC
40.0000 mg | DELAYED_RELEASE_TABLET | Freq: Two times a day (BID) | ORAL | Status: DC
  Administered 2022-01-22 – 2022-01-23 (×2): 40 mg via ORAL
  Filled 2022-01-22 (×2): qty 1

## 2022-01-22 NOTE — Evaluation (Signed)
Occupational Therapy Evaluation ?Patient Details ?Name: Stephen [name] ?MRN: [id number] ?DOB: December 09, 1926 ?Today's Date: 01/22/2022 ? ? ?History of Present Illness Patient is a 88 year old male w/ a PMH (+) for stroke, vascular dementia, Alzheimer's dementia, untreated left lung cancer, CKD stage IIIa, and type 2 diabetes. Patient currently lives in memory care. Presented to [facility] with increasing fatigue. Admitted for Symptomatic anemia.  ? ?Clinical Impression ?  ?Pt seen for OT evaluation this date. Upon arrival to room, pt awake and seated upright in bed. Pt alert and oriented to self and was aware he was in a hospital (although stated he was in [place]). No family member present at time of eval. PLOF/home set-up obtained via chart review (see below); plan to confirm with family at later date as able. Pt currently requires SUPERVISION for bed mobility, MOD A for sit>stand transfers from bed (at standard height), MIN GUARD-MIN A for functional mobility of short household distances (87ft, 2x) with RW, MIN A for toilet transfers, and MOD A for peri-care d/t decreased activity tolerance, strength, and balance. Following walk to bathroom and transfer to toilet, pt observed to have increased work of breath, with SpO2 87% (increased to 92% following 60sec of PLB); RN informed. Pt would benefit from additional skilled OT services to maximize return to PLOF and minimize risk of future falls, injury, caregiver burden, and readmission. Upon discharge, recommend SNF.     ?   ? ?Recommendations for follow up therapy are one component of a multi-disciplinary discharge planning process, led by the attending physician.  Recommendations may be updated based on patient status, additional functional criteria and insurance authorization.  ? ?Follow Up Recommendations ? Skilled nursing-short term rehab (<3 hours/day)  ?  ?Assistance Recommended at Discharge Frequent or constant Supervision/Assistance  ?Patient can return home with the  following A little help with walking and/or transfers;A lot of help with bathing/dressing/bathroom ? ?  ?Functional Status Assessment ? Patient has had a recent decline in their functional status and demonstrates the ability to make significant improvements in function in a reasonable and predictable amount of time.  ?Equipment Recommendations ? Other (comment) (defer to next venue of care)  ?  ?   ?Precautions / Restrictions Precautions ?Precautions: Fall ?Restrictions ?Weight Bearing Restrictions: No  ? ?  ? ?Mobility Bed Mobility ?Overal bed mobility: Needs Assistance ?Bed Mobility: Supine to Sit, Sit to Supine ?  ?  ?Supine to sit: Supervision ?Sit to supine: Supervision ?  ?General bed mobility comments: With HOB elevated, requires SUPERVISION only ?  ? ?Transfers ?Overall transfer level: Needs assistance ?Equipment used: Rolling walker (2 wheels) ?Transfers: Sit to/from Stand ?Sit to Stand: Mod assist, Min assist ?  ?  ?  ?  ?  ?General transfer comment: x4 bouts; initially required MOD A however progressed to MIN A in final 2 bouts ?  ? ?  ?Balance Overall balance assessment: Needs assistance ?Sitting-balance support: No upper extremity supported, Feet supported ?Sitting balance-Leahy Scale: Fair ?Sitting balance - Comments: Requires SUPERVISION for sitting EOB ?  ?Standing balance support: Bilateral upper extremity supported, During functional activity, Reliant on assistive device for balance ?Standing balance-Leahy Scale: Poor ?Standing balance comment: Requires MIN GUARD-MIN A for functional mobility of short household distances ?  ?  ?  ?  ?  ?  ?  ?  ?  ?  ?  ?   ? ?ADL either performed or assessed with clinical judgement  ? ?ADL Overall ADL's : Needs assistance/impaired ?  ?  ?  Grooming: Wash/dry hands;Supervision/safety;Set up;Sitting ?Grooming Details (indicate cue type and reason): to wash hands with washcloth while seated on toilet ?  ?  ?  ?  ?  ?  ?  ?  ?Toilet Transfer: Minimal  assistance;Ambulation;Regular Toilet;Rolling walker (2 wheels);Grab bars ?  ?Toileting- Clothing Manipulation and Hygiene: Moderate assistance;Sitting/lateral lean ?Toileting - Clothing Manipulation Details (indicate cue type and reason): Required MOD A for thoroughness ?  ?  ?Functional mobility during ADLs: Min guard;Minimal assistance;Rolling walker (2 wheels) ?   ? ? ? ?Vision Baseline Vision/History: 1 Wears glasses ?Ability to See in Adequate Light: 0 Adequate ?Patient Visual Report: No change from baseline ?   ?   ?   ?   ? ?Pertinent Vitals/Pain Pain Assessment ?Pain Assessment: No/denies pain  ? ? ? ?Hand Dominance Right ?  ?Extremity/Trunk Assessment Upper Extremity Assessment ?Upper Extremity Assessment: Generalized weakness ?  ?Lower Extremity Assessment ?Lower Extremity Assessment: Generalized weakness ?  ?  ?  ?Communication Communication ?Communication: HOH ?  ?Cognition Arousal/Alertness: Awake/alert ?Behavior During Therapy: [facility] for tasks assessed/performed ?Overall Cognitive Status: History of cognitive impairments - at baseline ?  ?  ?  ?  ?  ?  ?  ?  ?  ?  ?  ?  ?  ?  ?  ?  ?General Comments: Pt alert and oriented to self and is aware he is in a hospital (although stated he was in [place]). ?  ?  ?General Comments  While on RA, SpO2 desat 87% following toilet transfer (SpO2 increased to 92% following PLB). Pt also observed to cough after drinking water. RN aware ? ?  ?   ?   ? ? ?Home Living Family/patient expects to be discharged to:: Assisted living ?  ?  ?  ?  ?  ?  ?  ?  ?  ?  ?  ?  ?  ?  ?Home Equipment: Rollator (4 wheels) ?  ?Additional Comments: [facility] care unit at [facility] ?  ? ?  ?Prior Functioning/Environment Prior Level of Function : Needs assist ? Cognitive Assist : Mobility (cognitive) ?  ?  ?Physical Assist : Mobility (physical) ?Mobility (physical): Bed mobility;Transfers ?  ?Mobility Comments: Rollator for short distances, hx of falls, at least 3 in the last 2 months ?ADLs Comments:  Assistance with showering ?  ? ?  ?  ?OT Problem List: Decreased strength;Decreased activity tolerance;Impaired balance (sitting and/or standing);Decreased cognition ?  ?   ?OT Treatment/Interventions: Self-care/ADL training;Therapeutic exercise;Energy conservation;Therapeutic activities;Patient/family education;Balance training  ?  ?OT Goals(Current goals can be found in the care plan section) Acute Rehab OT Goals ?OT Goal Formulation: Patient unable to participate in goal setting ?Time For Goal Achievement: 02/05/22 ?Potential to Achieve Goals: Good ?ADL Goals ?Pt Will Perform Grooming: with set-up;sitting ?Pt Will Transfer to Toilet: with min guard assist;ambulating;regular height toilet ?Pt Will Perform Toileting - Clothing Manipulation and hygiene: with min guard assist;sitting/lateral leans  ?OT Frequency: Min 2X/week ?  ? ?   ?AM-PAC OT "6 Clicks" Daily Activity     ?Outcome Measure Help from another person eating meals?: None ?Help from another person taking care of personal grooming?: A Little ?Help from another person toileting, which includes using toliet, bedpan, or urinal?: A Lot ?Help from another person bathing (including washing, rinsing, drying)?: A Lot ?Help from another person to put on and taking off regular upper body clothing?: A Little ?Help from another person to put on and taking off regular lower  body clothing?: A Lot ?6 Click Score: 16 ?  ?End of Session Equipment Utilized During Treatment: Gait belt;Rolling walker (2 wheels) ?Nurse Communication: Mobility status;Other (comment) (SpO2 during mobility) ? ?Activity Tolerance: Patient tolerated treatment well ?Patient left: in bed;with call bell/phone within reach;with bed alarm set ? ?OT Visit Diagnosis: Unsteadiness on feet (R26.81);Muscle weakness (generalized) (M62.81)  ?              ?Time: 0488-8916 ?OT Time Calculation (min): 31 min ?Charges:  OT General Charges ?$OT Visit: 1 Visit ?OT Evaluation ?$OT Eval Moderate Complexity: 1  Mod ?OT Treatments ?$Self Care/Home Management : 23-37 mins ? ?Fredirick Maudlin, OTR/L ?[place] ? ?

## 2022-01-22 NOTE — Clinical Social Work Note (Signed)
RE:  Stephen [name]   ?Date of Birth:   Aug 05, 1928__  ?Date: 01/22/22 ?To Whom It May Concern: ? ?Please be advised that the above-named patient will require a short-term nursing home stay - anticipated 30 days or less for rehabilitation and strengthening.  The plan is for return home. ? ? ? ? ?MD electronic signature noted below ?

## 2022-01-22 NOTE — Progress Notes (Signed)
Physical Therapy Treatment ?Patient Details ?Name: Stephen [name] ?MRN: [id number] ?DOB: July 10, 1927 ?Today's Date: 01/22/2022 ? ? ?History of Present Illness Patient is a 88 year old male w/ a PMH (+) for stroke, vascular dementia, Alzheimer's dementia, untreated left lung cancer, CKD stage IIIa, and type 2 diabetes. Patient currently lives in memory care. Presented to [facility] with increasing fatigue. Admitted for Symptomatic anemia. ? ?  ?PT Comments  ? ? Pt ready for session.  OT saw pt earlier today and stated he had more trouble walking today than yesterday.  He agrees to session.  To EOB with increased time but no assist today.  Steady in sitting.  He stands with min a x 1 but has exaggerated forward lean when standing and cues to correct.  He is able to walk a short distance today about 30' before fatigue and sits quickly in chair without reaching back.  Reviewed safety.  He is a bit SOB upon sitting with sats decreasing to 87% before returning to mid 90's.  SO in room stated SOB is overall improved since admission.   ? ?Discussed discharge plan with SO.  She stated she prefers him to transition to [facility] if available to him.  She feels with the increased trouble he is having with mobility and transition to Hospice care it is the better option for him.  Given balance and increasing mobility, it is a reasonable change.   ?  ?Recommendations for follow up therapy are one component of a multi-disciplinary discharge planning process, led by the attending physician.  Recommendations may be updated based on patient status, additional functional criteria and insurance authorization. ? ?Follow Up Recommendations ? Skilled nursing-short term rehab (<3 hours/day) ?  ?  ?Assistance Recommended at Discharge Frequent or constant Supervision/Assistance  ?Patient can return home with the following A little help with walking and/or transfers;Help with stairs or ramp for entrance;Assist for  transportation ?  ?Equipment Recommendations ? None recommended by PT  ?  ?Recommendations for Other Services   ? ? ?  ?Precautions / Restrictions Precautions ?Precautions: Fall ?Restrictions ?Weight Bearing Restrictions: No  ?  ? ?Mobility ? Bed Mobility ?Overal bed mobility: Needs Assistance ?Bed Mobility: Supine to Sit ?  ?  ?Supine to sit: Min guard ?  ?  ?General bed mobility comments: cues ?  ? ?Transfers ?  ?Equipment used: Rolling walker (2 wheels) ?Transfers: Sit to/from Stand ?Sit to Stand: Mod assist, Min assist ?  ?  ?  ?  ?  ?General transfer comment: heavy forward lean upon standing with cues to stand more upright ?  ? ?Ambulation/Gait ?Ambulation/Gait assistance: Min assist ?  ?Assistive device: Rolling walker (2 wheels) ?Gait Pattern/deviations: Step-through pattern, Decreased step length - right, Decreased step length - left, Decreased stride length, Narrow base of support, Trunk flexed ?Gait velocity: decreased ?  ?  ?General Gait Details: milkd-moderate unsteadiness with walking ? ? ?Stairs ?  ?  ?  ?  ?  ? ? ?Wheelchair Mobility ?  ? ?Modified Rankin (Stroke Patients Only) ?  ? ? ?  ?Balance Overall balance assessment: Needs assistance ?Sitting-balance support: No upper extremity supported, Feet supported ?Sitting balance-Leahy Scale: Fair ?  ?  ?Standing balance support: Bilateral upper extremity supported, During functional activity, Reliant on assistive device for balance ?Standing balance-Leahy Scale: Poor ?Standing balance comment: Requires MIN GUARD-MIN A for functional mobility of short household distances ?  ?  ?  ?  ?  ?  ?  ?  ?  ?  ?  ?  ? ?  ?  Cognition Arousal/Alertness: Awake/alert ?Behavior During Therapy: [facility] for tasks assessed/performed ?Overall Cognitive Status: History of cognitive impairments - at baseline ?  ?  ?  ?  ?  ?  ?  ?  ?  ?  ?  ?  ?  ?  ?  ?  ?  ?  ?  ? ?  ?Exercises   ? ?  ?General Comments General comments (skin integrity, edema, etc.): While on RA, SpO2 desat 87%  following toilet transfer (SpO2 increased to 92% following PLB). Pt also observed to cough after drinking water. RN aware ?  ?  ? ?Pertinent Vitals/Pain Pain Assessment ?Pain Assessment: No/denies pain  ? ? ?Home Living Family/patient expects to be discharged to:: Assisted living ?  ?  ?  ?  ?  ?  ?  ?  ?Home Equipment: Rollator (4 wheels) ?Additional Comments: Memory care unit at [facility]  ?  ?Prior Function    ?  ?  ?   ? ?PT Goals (current goals can now be found in the care plan section) Progress towards PT goals: Progressing toward goals ? ?  ?Frequency ? ? ? Min 2X/week ? ? ? ?  ?PT Plan Discharge plan needs to be updated  ? ? ?Co-evaluation   ?  ?  ?  ?  ? ?  ?AM-PAC PT "6 Clicks" Mobility   ?Outcome Measure ? Help needed turning from your back to your side while in a flat bed without using bedrails?: A Little ?Help needed moving from lying on your back to sitting on the side of a flat bed without using bedrails?: A Little ?Help needed moving to and from a bed to a chair (including a wheelchair)?: A Little ?Help needed standing up from a chair using your arms (e.g., wheelchair or bedside chair)?: A Little ?Help needed to walk in hospital room?: A Little ?Help needed climbing 3-5 steps with a railing? : A Lot ?6 Click Score: 17 ? ?  ?End of Session Equipment Utilized During Treatment: Gait belt ?Activity Tolerance: Patient tolerated treatment well ?Patient left: in bed;with call bell/phone within reach;with bed alarm set ?Nurse Communication: Mobility status ?PT Visit Diagnosis: Unsteadiness on feet (R26.81);Muscle weakness (generalized) (M62.81);Repeated falls (R29.6);History of falling (Z91.81);Difficulty in walking, not elsewhere classified (R26.2) ?  ? ? ?Time: 1040-1057 ?PT Time Calculation (min) (ACUTE ONLY): 17 min ? ?Charges:  $Gait Training: 8-22 mins          ?          ? ?Chesley Noon, PTA ?01/22/22, 11:46 AM ? ?

## 2022-01-22 NOTE — Progress Notes (Signed)
?Progress Note ? ? ?PatientJHETT [name] ZOX:[id number] DOB: [birth year] DOA: 01/19/2022     2 ?DOS: the patient was seen and examined on 01/22/2022 ?  ?Brief hospital course: ?Repeat42 year old white male with a history of stroke, vascular dementia, Alzheimer's dementia, untreated left lung cancer, CKD stage IIIa, type 2 diabetes currently lives at memory care assisted living.  On admission, patient's significant other ported that over the last 2 weeks, patient has had progressive fatigue and generalized weakness with poor appetite and declining oral intake.  She denied the patient having any black or tarry stools.   ? ?Evaluation in the ED revealed significant anemia with hemoglobin of 6.9.  He was admitted to the hospital and transfused 1 unit of packed red cells. ? ?CODE STATUS and goals of care were discussed at time of admission.  Patient is DNR/DNI.  Significant other declined invasive or aggressive measures including colonoscopy or EGD to evaluate for GI blood loss, citing patient's advanced age, dementia and declining quality of life in general. ? ?Assessment and Plan: ?* Symptomatic anemia ?Dr. Bridgett Larsson discussed with the patient's significant other [name] on admission: no aggressive measures or invasive procedures, declined any GI work-up including colonoscopy and/or EGD.  She was agreeable with blood transfusion.  ? ?"Discussed the possibility of him having GI cancer, metastatic disease from his lung cancer.  She is aware of this.  She states the patient has poor quality of life.  However she is not ready to make him comfort care just yet.  We will continue DNR status.  1 unit packed red blood cell transfusion ordered by the ER.  Repeat CBC in the morning." ? ?Status post total 2 units PRBCs transfused since admission ? ?Hbg 8.0 this AM, repeat hemoglobin this afternoon 8.8. ?--Trend Hbg & transfuse if <= 7.0 ?--Iron transfusion & d/c on oral iron supplement ? ?Lung cancer ([place]) - left side.  untreated. ?Patient has a malignant left lung nodule.  [name], patient's significant other states that this is not being treated. ? 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 ? ?Of note, patient had a PET scan in October 2020 that showed his left lung nodule concerning for bronchogenic carcinoma.  He also had distal esophageal hyperactivity.  They are also concerned about esophageal cancer.  Patient never had a work-up for this. ? ?Vascular dementia ([place]) ?Chronic.  Significant other also states he has Alzheimer's type dementia.  Patient sometimes is able to recognize his significant other, but not oriented x3. ?-- Continue home [place] ? ?Stage 3a chronic kidney disease (CKD) ([place]) ?Chronic. ? ?DNR (do not resuscitate)/DNI(Do Not Intubate) ?Discussed with the patient's significant other [name] on admission.   ?DNR/DNI status. ?Yellow DNR form in chart. ? ? ? ?Hiatal hernia ?Chest x-ray on admission showed possible right sided pleural effusion versus hiatal hernia.  Subsequent chest CT consistent with moderate hiatal hernia and no pleural effusion seen. ? ?Pulmonary interstitial fibrosis ([place]) ?Respiratory status is stable, no dyspnea at this time.  Not on home O2.  On room air here. ? ?Chest CT on admission with diffuse interstitial pattern mostly peripherally distributed including areas of honeycomb formation and groundglass alveolar changes.  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 ? ?Controlled type 2 diabetes mellitus with stage 3 chronic kidney disease,  without long-term current use of insulin ([place]) ?Hold metformin.  ?Sliding scale NovoLog. ? ?Hemoglobin A1c this admission 8.9 up from prior 6.9.  Spouse reports  his metformin had been reduced to only once daily because it was well controlled.  At discharge, increase metformin back to twice daily.  Defer further regimen changes to primary care. ? ?Iron deficiency anemia ?Iron studies: iron 20, normal TIBC 413, low sat ratio 5%.  Suspect chronic occult blood loss vs dietary etiology. ?--Iron infusion today ?-- Start oral iron supplement with breakfast tomorrow ?--Follow CBC ? ?Constipation ?Continue home bowel regimen ? ? ? ? ?  ? ?Subjective: Patient seen up in recliner with wife at bedside today after working with OT this morning.  He denies pain or feeling sick at all.  No acute events reported. ? ?Physical Exam: ?Vitals:  ? 01/22/22 0915 01/22/22 1128 01/22/22 1134 01/22/22 1200  ?BP: 112/67 115/66  (!) 105/59  ?Pulse: 89 82  85  ?Resp: (!) 24 (!) 26 (!) 22 20  ?Temp: 98.3 ?F (36.8 ?C) 98.1 ?F (36.7 ?C)  98.4 ?F (36.9 ?C)  ?TempSrc:      ?SpO2: 96% 96% 96% 96%  ?Weight:      ? ?General exam: awake, alert, no acute distress ?HEENT: moist mucus membranes, hearing grossly normal  ?Respiratory system: CTAB, no wheezes, rales or rhonchi, normal respiratory effort. ?Cardiovascular system: normal S1/S2, RRR, no pedal edema.   ?Gastrointestinal system: soft, nontender abdomen ?Central nervous system: A&O x3. no gross focal neurologic deficits, normal speech ?Extremities: moves all, no edema, normal tone ?Skin: dry, intact, normal temperature ?Psychiatry: normal mood, congruent affect, abnormal judgement and insight due to dementia ? ? ? ? ?Data Reviewed: ? ?Labs reviewed and notable for hemoglobin this morning 8.0, repeat later 8.8, CBGs controlled 169, 179 ? ?Family Communication: Spouse [name] at bedside on rounds today ? ?Disposition: ?Status is: Inpatient ?Remains inpatient appropriate because: Severity of illness warranting further close monitoring of hemoglobin stability prior to discharge.  Anticipate likely discharge tomorrow ? ? ? Planned Discharge Destination: Skilled  nursing facility ? ? ? ?Time spent: 35 minutes ? ?Author: ?Ezekiel Slocumb, DO ?01/22/2022 3:41 PM ? ?For on call review [email or web address].  ?

## 2022-01-22 NOTE — Progress Notes (Signed)
[facility] [address] Manufacturing engineer [facility]) Note: ?  ?Per hospital staff, plan is for patient to return to [facility] (TL) once medically ready. Patient will be returning to the SNF side of the facility vs ALF. Per MD, patient is anticipated to return to TL today. [facility] will continue to follow throughout the duration of hospitalization stay and begin services once patient returns.  Appropriate ACC staff aware of above updates. ?  ?Once ready to discharge, please send completed DNR with patient. Please also arrange for any comfort meds, if indicated, so there is no lapse in any symptoms prior to hospice arriving to begin services. ?  ?Please call with any questions/concerns.  ?  ?Thank you for the opportunity to participate in this patient's care. ? ?Thank you, ?Daphene Calamity, MSW ?[facility] Hospital Liaison  ?

## 2022-01-22 NOTE — NC FL2 (Signed)
[place] LEVEL OF CARE SCREENING TOOL     IDENTIFICATION  Patient Name: Stephen [name] Birthdate: 06/23/1927 Sex: male Admission Date (Current Location): 01/19/2022  [place] and [place] Number:  Engineering geologist and Address:  [facility], [address], [place], [place] [address]      Provider Number: [id number]  Attending Physician Name and Address:  Ezekiel Slocumb, DO  Relative Name and Phone Number:       Current Level of Care: Hospital Recommended Level of Care: [place] Prior Approval Number:    Date Approved/Denied:   PASRR Number:    Discharge Plan: SNF    Current Diagnoses: Patient Active Problem List   Diagnosis Date Noted   Iron deficiency anemia 01/21/2022   Symptomatic anemia 01/19/2022   Vascular dementia ([place]) 01/19/2022   Lung cancer ([place]) - left side. untreated. 01/19/2022   Stage 3a chronic kidney disease (CKD) ([place]) 06/02/2019   Sundowning 06/02/2019   Pulmonary nodule 06/02/2019   DNR (do not resuscitate)/DNI(Do Not Intubate) 05/05/2018   Chest pain 04/27/2018   Carotid stenosis, symptomatic, with infarction ([place]) 03/30/2018   Aphasia due to acute stroke ([place]) 03/30/2018   History of ischemic left MCA stroke 00/92/3300   Systolic murmur 76/22/6333   Complex sleep apnea syndrome 11/23/2017   PLMD (periodic limb movement disorder) 11/23/2017   Mixed Alzheimer's and vascular dementia ([place]) 12/17/2016   Paresthesias 12/12/2015   Anemia, unspecified 12/12/2015   Hiatal hernia 05/23/2015   Pulmonary interstitial fibrosis ([place]) 02/15/2015   Hepatic lesion 02/15/2015   Abnormal barium swallow 01/25/2015   Dysphagia 01/07/2015   Advanced care planning/counseling discussion 11/29/2014   Constipation 03/31/2014   Coronary artery disease 03/12/2014   Medicare annual wellness visit, subsequent 11/27/2013   Hyperlipidemia associated with type 2 diabetes mellitus ([place]) 11/27/2013    Insomnia    Controlled type 2 diabetes mellitus with stage 3 chronic kidney disease, without long-term current use of insulin (HCC)    Benign prostatic hyperplasia    Rosacea    History of colon polyps     Orientation RESPIRATION BLADDER Height & Weight     Self  Normal Incontinent Weight: 200 lb 6 oz (90.9 kg) Height:     BEHAVIORAL SYMPTOMS/MOOD NEUROLOGICAL BOWEL NUTRITION STATUS      Incontinent Diet (heart healthy/carb modified, thin liquids)  AMBULATORY STATUS COMMUNICATION OF NEEDS Skin   Extensive Assist Verbally Normal                       Personal Care Assistance Level of Assistance  Bathing, Feeding, Dressing Bathing Assistance: Maximum assistance Feeding assistance: Independent Dressing Assistance: Maximum assistance     Functional Limitations Info  Sight, Hearing, Speech Sight Info: Adequate Hearing Info: Adequate Speech Info: Adequate    SPECIAL CARE FACTORS FREQUENCY  PT (By licensed PT), OT (By licensed OT)     PT Frequency: 5x OT Frequency: 5x            Contractures Contractures Info: Not present    Additional Factors Info  Code Status, Allergies Code Status Info: DNR Allergies Info: Aricept (Donepezil Hcl), Belsomra (Suvorexant), Vancomycin, Ambien (Zolpidem Tartrate), Atorvastatin, Doxycycline, Penicillins, Tamsulosin           Current Medications (01/22/2022):  This is the current hospital active medication list Current Facility-Administered Medications  Medication Dose Route Frequency Provider Last Rate Last Admin   acetaminophen (TYLENOL) tablet 650 mg  650 mg Oral Q6H  PRN Kristopher Oppenheim, DO       Or   acetaminophen (TYLENOL) suppository 650 mg  650 mg Rectal Q6H PRN Kristopher Oppenheim, DO       bisacodyl (DULCOLAX) suppository 10 mg  10 mg Rectal Daily PRN Nicole Kindred A, DO       clonazePAM (KLONOPIN) tablet 1 mg  1 mg Oral QHS Nicole Kindred A, DO   1 mg at 01/21/22 2215   docusate sodium (COLACE) capsule 100 mg  100 mg Oral Daily  Nicole Kindred A, DO   100 mg at 01/22/22 0847   [START ON 01/23/2022] ferrous sulfate tablet 325 mg  325 mg Oral Q breakfast Nicole Kindred A, DO       finasteride (PROSCAR) tablet 5 mg  5 mg Oral Daily Nicole Kindred A, DO   5 mg at 01/22/22 0847   insulin aspart (novoLOG) injection 0-15 Units  0-15 Units Subcutaneous TID WC Kristopher Oppenheim, DO   3 Units at 01/22/22 1232   iron sucrose (VENOFER) 500 mg in sodium chloride 0.9 % 250 mL IVPB  500 mg Intravenous Once Nicole Kindred A, DO 78.6 mL/hr at 01/22/22 1235 500 mg at 01/22/22 1235   memantine (NAMENDA) tablet 10 mg  10 mg Oral BID Nicole Kindred A, DO   10 mg at 01/22/22 0847   ondansetron (ZOFRAN) tablet 4 mg  4 mg Oral Q6H PRN Kristopher Oppenheim, DO       Or   ondansetron [facility]) injection 4 mg  4 mg Intravenous Q6H PRN Kristopher Oppenheim, DO       pantoprazole (PROTONIX) EC tablet 40 mg  40 mg Oral Daily Nicole Kindred A, DO   40 mg at 01/22/22 0847   polyethylene glycol (MIRALAX / GLYCOLAX) packet 8.5 g  8.5 g Oral Daily Nicole Kindred A, DO   8.5 g at 01/22/22 3888     Discharge Medications: Please see discharge summary for a list of discharge medications.  Relevant Imaging Results:  Relevant Lab Results:   Additional Information [id number]  Eileen Stanford, LCSW

## 2022-01-23 LAB — HEMOGLOBIN AND HEMATOCRIT, BLOOD
HCT: 28.3 % — ABNORMAL LOW (ref 39.0–52.0)
Hemoglobin: 8.5 g/dL — ABNORMAL LOW (ref 13.0–17.0)

## 2022-01-23 LAB — GLUCOSE, CAPILLARY: Glucose-Capillary: 161 mg/dL — ABNORMAL HIGH (ref 70–99)

## 2022-01-23 MED ORDER — CLONAZEPAM 1 MG PO TABS: 1.0000 mg | ORAL_TABLET | Freq: Every day | ORAL | 0 refills | Status: DC

## 2022-01-23 MED ORDER — FERROUS SULFATE 325 (65 FE) MG PO TABS: 325.0000 mg | ORAL_TABLET | Freq: Every day | ORAL | 3 refills | Status: DC

## 2022-01-23 MED ORDER — PANTOPRAZOLE SODIUM 40 MG PO TBEC: 40.0000 mg | DELAYED_RELEASE_TABLET | Freq: Two times a day (BID) | ORAL | 1 refills | Status: DC

## 2022-01-23 MED ORDER — METFORMIN HCL 500 MG PO TABS: 500.0000 mg | ORAL_TABLET | Freq: Two times a day (BID) | ORAL | Status: DC

## 2022-01-23 NOTE — TOC Transition Note (Signed)
Transition of Care (TOC) - CM/SW Discharge Note ? ? ?Patient Details  ?Name: Stephen [name] ?MRN: [id number] ?Date of Birth: 1927-09-12 ? ?Transition of Care (TOC) CM/SW Contact:  ?Rolin Schult A Amaryllis Malmquist, LCSW ?Phone Number: ?01/23/2022, 11:10 AM ? ? ?Clinical Narrative:   Clinical Social Worker facilitated patient discharge including contacting patient family and facility to confirm patient discharge plans.  Clinical information faxed to facility and family agreeable with plan.  CSW arranged ambulance transport via [facility] to [facility] (room 112).  RN to call [phone] for report prior to discharge. ? ? ? ? ?Final next level of care: [place] ?Barriers to Discharge: No Barriers Identified ? ? ?Patient Goals and CMS Choice ?  ?CMS Medicare.gov Compare Post Acute Care list provided to:: Patient Represenative (must comment) ?Choice offered to / list presented to : Spouse ? ?Discharge Placement ?  ?           ?Patient [facility] bed at:  [facility] ?Patient to be transferred to facility by: [facility] ?  ?Patient and family notified of of transfer: 01/23/22 ? ?Discharge Plan and Services ?  ?  ?           ?  ?  ?  ?  ?  ?  ?  ?  ?  ?  ? ?Social Determinants of Health (SDOH) Interventions ?  ? ? ?Readmission Risk Interventions ?Readmission Risk Prevention Plan 01/21/2022  ?Transportation Screening Complete  ?PCP or Specialist Appt within 5-7 Days Complete  ?Home Care Screening Complete  ?Medication Review (RN CM) Complete  ?Some recent data might be hidden  ? ? ? ? ? ?

## 2022-01-23 NOTE — Care Management Important Message (Signed)
Important Message ? ?Patient Details  ?Name: Stephen [name] ?MRN: [id number] ?Date of Birth: 05/19/1927 ? ? ?Medicare Important Message Given:  Yes ? ? ? ? ?Juliann Pulse A Brayden Brodhead ?01/23/2022, 11:35 AM ?

## 2022-01-23 NOTE — Progress Notes (Signed)
Report called to Sherren Mocha, Therapist, sports at [facility]. EMS transport arranged. ?

## 2022-01-23 NOTE — Progress Notes (Signed)
Attempted to call report. No response. Will try again at a later time. ?

## 2022-01-23 NOTE — Discharge Summary (Signed)
?Physician Discharge Summary ?  ?Patient: Stephen [name] MRN: [id number] DOB: 03-29-1927  ?Admit date:     01/19/2022  ?Discharge date: 01/23/22  ?Discharge Physician: Stephen [name]  ? ?PCP: Stephen Carbon, MD  ? ?Recommendations at discharge:  ? ? Follow up with Hospice ?Follow up with Primary Care  ?Follow up CBC in 1-2 weeks ? ? ?Discharge Diagnoses: ?Principal Problem: ?  Symptomatic anemia ?Active Problems: ?  Controlled type 2 diabetes mellitus with stage 3 chronic kidney disease, without long-term current use of insulin (Stephen [name]) ?  Pulmonary interstitial fibrosis ([place]) ?  Hiatal hernia ?  DNR (do not resuscitate)/DNI(Do Not Intubate) ?  Stage 3a chronic kidney disease (CKD) ([place]) ?  Vascular dementia ([place]) ?  Lung cancer ([place]) - left side. untreated. ?  Constipation ?  Iron deficiency anemia ? ? ?Hospital Course: ?Repeat45 year old white male with a history of stroke, vascular dementia, Alzheimer's dementia, untreated left lung cancer, CKD stage IIIa, type 2 diabetes currently lives at memory care assisted living.  On admission, patient's significant other ported that over the last 2 weeks, patient has had progressive fatigue and generalized weakness with poor appetite and declining oral intake.  She denied the patient having any black or tarry stools.   ? ?Evaluation in the ED revealed significant anemia with hemoglobin of 6.9.  He was admitted to the hospital and transfused 1 unit of packed red cells. ? ?CODE STATUS and goals of care were discussed at time of admission.  Patient is DNR/DNI.  Significant other declined invasive or aggressive measures including colonoscopy or EGD to evaluate for GI blood loss, citing patient's advanced age, dementia and declining quality of life in general. ? ?Assessment and Plan: ?* Symptomatic anemia ?Stephen [name] discussed with the patient's significant other Stephen [name] on admission: no aggressive measures or invasive procedures, declined any GI work-up including colonoscopy  and/or EGD.  She was agreeable with blood transfusions.  ? ?Status post total 2 units PRBCs transfused since admission ?Hbg 8.5 this AM, stable. ?--Oral Iron supplement started this AM ? ?Lung cancer ([place]) - left side. untreated. ?Patient has a malignant left lung nodule.  Stephen [name], patient's significant other states that this is not being treated. ? 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 ? ?Of note, patient had a PET scan in October 2020 that showed his left lung nodule concerning for bronchogenic carcinoma.  He also had distal esophageal hyperactivity.  They are also concerned about esophageal cancer.  Patient never had a work-up for this. ? ?Vascular dementia ([place]) ?Chronic.  Significant other also states he has Alzheimer's type dementia.  Patient sometimes is able to recognize his significant other, but not oriented x3. ?-- Continue home Nam[place] ? ?Stage 3a chronic kidney disease (CKD) ([place]) ?Chronic. ? ?DNR (do not resuscitate)/DNI(Do Not Intubate) ?Discussed with the patient's significant other Stephen [name] on admission.   ?DNR/DNI status. ?Yellow DNR form in chart. ?Hospice to follow patient after discharge. ? ? ?Hiatal hernia ?Chest x-ray on admission showed possible right sided pleural effusion versus hiatal hernia.  Subsequent chest CT consistent with moderate hiatal hernia and no pleural effusion seen. ? ?Pulmonary interstitial fibrosis ([place]) ?Respiratory status is stable, no dyspnea at this time.  Not on home O2.  On room air here. ? ?Chest CT on admission with  diffuse interstitial pattern mostly peripherally distributed including areas of honeycomb formation and groundglass alveolar changes.  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 ? ?Controlled type 2 diabetes mellitus with stage 3 chronic kidney disease, without long-term current use of insulin ([place]) ?Hold metformin. Covered with sliding scale NovoLog during admission. ? ?Hemoglobin A1c this admission 8.9% up from prior 6.9.  Spouse reports his metformin had been reduced to only once daily because it was well controlled.   ? ?At discharge, increase metformin back to twice daily.  Defer further regimen changes to primary care. ? ?Iron deficiency anemia ?Iron studies: iron 20, normal TIBC 413, low sat ratio 5%.  Suspect chronic occult blood loss vs dietary etiology. ?Patient was transfused 2 units pRBC's during admission.  Iron infusion done on 3/13. ?-- Started on PO iron daily with breakfast --Follow CBC ? ?Constipation ?Continue home bowel regimen ? ? ? ? ?  ? ? ?Consultants: Hospice ?Procedures performed: RBC transfusion x 2 units  ?Disposition: Skilled nursing facility ?Diet recommendation:  ?Cardiac and Carb modified diet ? ? ? ?DISCHARGE MEDICATION: ?Allergies as of 01/23/2022   ? ?   Reactions  ? Aricept [donepezil Hcl] Diarrhea  ? severe  ? Belsomra [suvorexant] Other (See Comments)  ? Anxiety. Did not work  ? Vancomycin Itching, Rash, Other (See Comments)  ? Burning pain   ? Ambien [zolpidem Tartrate] Other (See Comments)  ? Gi problems  ? Atorvastatin   ? muscle aches  ? Doxycycline Rash  ? Penicillins Hives  ? Has patient had a PCN reaction causing immediate rash, facial/tongue/throat swelling, SOB or lightheadedness with hypotension: Yes ?Has patient had a PCN reaction causing severe rash involving mucus membranes or skin necrosis: Yes ?Has patient had a PCN reaction that required hospitalization No ?Has patient had a PCN reaction occurring within the last 10 years: No ?If all of the above answers are "NO", then may proceed with Cephalosporin use.  ? Tamsulosin Other (See Comments)  ? Leg weakness.   ? ?  ? ?  ?Medication List  ?  ? ?TAKE these medications    ? ?acetaminophen 650 MG CR tablet ?Commonly known as: TYLENOL ?Take 650 mg by mouth every 8 (eight) hours. ?  ?bisacodyl 10 MG suppository ?Commonly known as: DULCOLAX ?Place 10 mg rectally daily as needed for moderate constipation. ?  ?clonazePAM 1 MG tablet ?Commonly known as: KLONOPIN ?Take 1 tablet (1 mg total) by mouth at bedtime. ?  ?clopidogrel 75 MG tablet ?Commonly known as: PLAVIX ?TAKE 1 TABLET (75 MG TOTAL) BY MOUTH AT BEDTIME. ?  ?diclofenac Sodium 1 % Gel ?Commonly known as: VOLTAREN ?Apply 2 g topically 3 (three) times daily as needed (pain). ?  ?docusate sodium 100 MG capsule ?Commonly known as: COLACE ?Take 100 mg by mouth daily. ?  ?ferrous sulfate 325 (65 FE) MG tablet ?Take 1 tablet (325 mg total) by mouth daily with breakfast. ?Start taking on: January 24, 2022 ?  ?finasteride 5 MG tablet ?Commonly known as: PROSCAR ?TAKE 1 TABLET BY MOUTH EVERY DAY ?  ?memantine 10 MG tablet ?Commonly known as: NAMENDA ?TAKE 1 TABLET BY MOUTH TWICE A DAY ?  ?metFORMIN 500 MG tablet ?Commonly known as: GLUCOPHAGE ?Take 1 tablet (500 mg total) by mouth 2 (two) times daily. ?What changed: See the new instructions. ?  ?multivitamin with minerals Tabs tablet ?Take 1 tablet by mouth daily. ?  ?pantoprazole 40 MG tablet ?Commonly known as: PROTONIX ?Take 1 tablet (40  mg total) by mouth 2 (two) times daily. ?  ?polyethylene glycol 17 g packet ?Commonly known as: MIRALAX / GLYCOLAX ?Take 17 g by mouth every other day. ?  ?pravastatin 40 MG tablet ?Commonly known as: PRAVACHOL ?TAKE 1 TABLET BY MOUTH EVERY DAY ?  ?sodium fluoride 1.1 % Crea dental cream ?Commonly known as: PREVIDENT 5000 PLUS ?Place 1 application. onto teeth every evening. ?  ?vitamin B-12 500 MCG tablet ?Commonly known as: CYANOCOBALAMIN ?Take 500 mcg by mouth daily. ?  ? ?  ? ? ?Discharge Exam: ?Danley Danker Weights  ? 01/20/22 0335  ?Weight: 90.9 kg  ? ?General exam: awake, alert, no acute distress ?HEENT: moist mucus membranes, hearing grossly normal   ?Respiratory system: CTAB, no wheezes, rales or rhonchi, normal respiratory effort. ?Cardiovascular system: normal S1/S2, RRR, no pedal edema.   ?Gastrointestinal system: soft, NT, ND, no HSM felt, +bowel sou

## 2022-01-23 NOTE — Progress Notes (Signed)
[facility] [employer] [facility]) Note: ?  ?Per  TOC, patient awaiting transport to [facility]. [facility] staff has been notified of D/C plan. ?  ?Please send completed DNR with patient. Please also arrange for any comfort meds, if indicated, so there is no lapse in any symptoms prior to hospice arriving to begin services. ?  ?Please call with any questions/concerns.  ?  ?Thank you for the opportunity to participate in this patient's care. ?  ?Thank you, ?Daphene Calamity, MSW ?[facility] Liaison  ?

## 2022-01-24 DIAGNOSIS — D631 Anemia in chronic kidney disease: Secondary | ICD-10-CM | POA: Diagnosis not present

## 2022-01-24 DIAGNOSIS — M1990 Unspecified osteoarthritis, unspecified site: Secondary | ICD-10-CM | POA: Diagnosis not present

## 2022-01-24 DIAGNOSIS — E1122 Type 2 diabetes mellitus with diabetic chronic kidney disease: Secondary | ICD-10-CM | POA: Diagnosis not present

## 2022-01-24 DIAGNOSIS — K449 Diaphragmatic hernia without obstruction or gangrene: Secondary | ICD-10-CM | POA: Diagnosis not present

## 2022-01-24 DIAGNOSIS — E785 Hyperlipidemia, unspecified: Secondary | ICD-10-CM | POA: Diagnosis not present

## 2022-01-24 DIAGNOSIS — K219 Gastro-esophageal reflux disease without esophagitis: Secondary | ICD-10-CM | POA: Diagnosis not present

## 2022-01-24 DIAGNOSIS — I2510 Atherosclerotic heart disease of native coronary artery without angina pectoris: Secondary | ICD-10-CM | POA: Diagnosis not present

## 2022-01-24 DIAGNOSIS — J8410 Pulmonary fibrosis, unspecified: Secondary | ICD-10-CM | POA: Diagnosis not present

## 2022-01-24 DIAGNOSIS — F0150 Vascular dementia without behavioral disturbance: Secondary | ICD-10-CM | POA: Diagnosis not present

## 2022-01-24 DIAGNOSIS — L03113 Cellulitis of right upper limb: Secondary | ICD-10-CM | POA: Diagnosis not present

## 2022-01-24 DIAGNOSIS — K2289 Other specified disease of esophagus: Secondary | ICD-10-CM | POA: Diagnosis not present

## 2022-01-24 DIAGNOSIS — I8000 Phlebitis and thrombophlebitis of superficial vessels of unspecified lower extremity: Secondary | ICD-10-CM | POA: Diagnosis not present

## 2022-01-24 DIAGNOSIS — F0280 Dementia in other diseases classified elsewhere without behavioral disturbance: Secondary | ICD-10-CM | POA: Diagnosis not present

## 2022-01-24 DIAGNOSIS — C3492 Malignant neoplasm of unspecified part of left bronchus or lung: Secondary | ICD-10-CM | POA: Diagnosis not present

## 2022-01-24 DIAGNOSIS — I252 Old myocardial infarction: Secondary | ICD-10-CM | POA: Diagnosis not present

## 2022-01-24 DIAGNOSIS — E43 Unspecified severe protein-calorie malnutrition: Secondary | ICD-10-CM | POA: Diagnosis not present

## 2022-01-24 DIAGNOSIS — N1831 Chronic kidney disease, stage 3a: Secondary | ICD-10-CM | POA: Diagnosis not present

## 2022-01-24 DIAGNOSIS — N400 Enlarged prostate without lower urinary tract symptoms: Secondary | ICD-10-CM | POA: Diagnosis not present

## 2022-01-24 DIAGNOSIS — G309 Alzheimer's disease, unspecified: Secondary | ICD-10-CM | POA: Diagnosis not present

## 2022-01-24 DIAGNOSIS — Z6830 Body mass index (BMI) 30.0-30.9, adult: Secondary | ICD-10-CM | POA: Diagnosis not present

## 2022-01-24 LAB — CULTURE, BLOOD (ROUTINE X 2)
Culture: NO GROWTH
Culture: NO GROWTH

## 2022-01-25 DIAGNOSIS — C3492 Malignant neoplasm of unspecified part of left bronchus or lung: Secondary | ICD-10-CM | POA: Diagnosis not present

## 2022-01-25 DIAGNOSIS — G301 Alzheimer's disease with late onset: Secondary | ICD-10-CM | POA: Diagnosis not present

## 2022-01-25 DIAGNOSIS — F0280 Dementia in other diseases classified elsewhere without behavioral disturbance: Secondary | ICD-10-CM | POA: Diagnosis not present

## 2022-01-25 DIAGNOSIS — Z8673 Personal history of transient ischemic attack (TIA), and cerebral infarction without residual deficits: Secondary | ICD-10-CM | POA: Diagnosis not present

## 2022-01-25 DIAGNOSIS — F0150 Vascular dementia without behavioral disturbance: Secondary | ICD-10-CM | POA: Diagnosis not present

## 2022-01-25 DIAGNOSIS — F39 Unspecified mood [affective] disorder: Secondary | ICD-10-CM | POA: Diagnosis not present

## 2022-01-25 DIAGNOSIS — E1121 Type 2 diabetes mellitus with diabetic nephropathy: Secondary | ICD-10-CM | POA: Diagnosis not present

## 2022-01-25 DIAGNOSIS — C3490 Malignant neoplasm of unspecified part of unspecified bronchus or lung: Secondary | ICD-10-CM | POA: Diagnosis not present

## 2022-01-25 DIAGNOSIS — C787 Secondary malignant neoplasm of liver and intrahepatic bile duct: Secondary | ICD-10-CM | POA: Diagnosis not present

## 2022-01-25 DIAGNOSIS — G309 Alzheimer's disease, unspecified: Secondary | ICD-10-CM | POA: Diagnosis not present

## 2022-01-25 DIAGNOSIS — K2289 Other specified disease of esophagus: Secondary | ICD-10-CM | POA: Diagnosis not present

## 2022-01-25 DIAGNOSIS — N1831 Chronic kidney disease, stage 3a: Secondary | ICD-10-CM | POA: Diagnosis not present

## 2022-01-25 DIAGNOSIS — D631 Anemia in chronic kidney disease: Secondary | ICD-10-CM | POA: Diagnosis not present

## 2022-01-29 DIAGNOSIS — F0280 Dementia in other diseases classified elsewhere without behavioral disturbance: Secondary | ICD-10-CM | POA: Diagnosis not present

## 2022-01-29 DIAGNOSIS — G309 Alzheimer's disease, unspecified: Secondary | ICD-10-CM | POA: Diagnosis not present

## 2022-01-29 DIAGNOSIS — K2289 Other specified disease of esophagus: Secondary | ICD-10-CM | POA: Diagnosis not present

## 2022-01-29 DIAGNOSIS — F0150 Vascular dementia without behavioral disturbance: Secondary | ICD-10-CM | POA: Diagnosis not present

## 2022-01-29 DIAGNOSIS — C3492 Malignant neoplasm of unspecified part of left bronchus or lung: Secondary | ICD-10-CM | POA: Diagnosis not present

## 2022-01-29 DIAGNOSIS — D631 Anemia in chronic kidney disease: Secondary | ICD-10-CM | POA: Diagnosis not present

## 2022-01-31 DIAGNOSIS — F0150 Vascular dementia without behavioral disturbance: Secondary | ICD-10-CM | POA: Diagnosis not present

## 2022-01-31 DIAGNOSIS — C3492 Malignant neoplasm of unspecified part of left bronchus or lung: Secondary | ICD-10-CM | POA: Diagnosis not present

## 2022-01-31 DIAGNOSIS — D631 Anemia in chronic kidney disease: Secondary | ICD-10-CM | POA: Diagnosis not present

## 2022-01-31 DIAGNOSIS — F0280 Dementia in other diseases classified elsewhere without behavioral disturbance: Secondary | ICD-10-CM | POA: Diagnosis not present

## 2022-01-31 DIAGNOSIS — G309 Alzheimer's disease, unspecified: Secondary | ICD-10-CM | POA: Diagnosis not present

## 2022-01-31 DIAGNOSIS — K2289 Other specified disease of esophagus: Secondary | ICD-10-CM | POA: Diagnosis not present

## 2022-02-05 DIAGNOSIS — F0150 Vascular dementia without behavioral disturbance: Secondary | ICD-10-CM | POA: Diagnosis not present

## 2022-02-05 DIAGNOSIS — C3492 Malignant neoplasm of unspecified part of left bronchus or lung: Secondary | ICD-10-CM | POA: Diagnosis not present

## 2022-02-05 DIAGNOSIS — G309 Alzheimer's disease, unspecified: Secondary | ICD-10-CM | POA: Diagnosis not present

## 2022-02-05 DIAGNOSIS — F0280 Dementia in other diseases classified elsewhere without behavioral disturbance: Secondary | ICD-10-CM | POA: Diagnosis not present

## 2022-02-05 DIAGNOSIS — K2289 Other specified disease of esophagus: Secondary | ICD-10-CM | POA: Diagnosis not present

## 2022-02-05 DIAGNOSIS — D631 Anemia in chronic kidney disease: Secondary | ICD-10-CM | POA: Diagnosis not present

## 2022-02-07 DIAGNOSIS — G309 Alzheimer's disease, unspecified: Secondary | ICD-10-CM | POA: Diagnosis not present

## 2022-02-07 DIAGNOSIS — L853 Xerosis cutis: Secondary | ICD-10-CM | POA: Diagnosis not present

## 2022-02-07 DIAGNOSIS — K2289 Other specified disease of esophagus: Secondary | ICD-10-CM | POA: Diagnosis not present

## 2022-02-07 DIAGNOSIS — C3492 Malignant neoplasm of unspecified part of left bronchus or lung: Secondary | ICD-10-CM | POA: Diagnosis not present

## 2022-02-07 DIAGNOSIS — D631 Anemia in chronic kidney disease: Secondary | ICD-10-CM | POA: Diagnosis not present

## 2022-02-07 DIAGNOSIS — F0150 Vascular dementia without behavioral disturbance: Secondary | ICD-10-CM | POA: Diagnosis not present

## 2022-02-07 DIAGNOSIS — F0280 Dementia in other diseases classified elsewhere without behavioral disturbance: Secondary | ICD-10-CM | POA: Diagnosis not present

## 2022-02-07 DIAGNOSIS — L570 Actinic keratosis: Secondary | ICD-10-CM | POA: Diagnosis not present

## 2022-02-07 DIAGNOSIS — L718 Other rosacea: Secondary | ICD-10-CM | POA: Diagnosis not present

## 2022-02-08 DIAGNOSIS — C3492 Malignant neoplasm of unspecified part of left bronchus or lung: Secondary | ICD-10-CM | POA: Diagnosis not present

## 2022-02-08 DIAGNOSIS — K2289 Other specified disease of esophagus: Secondary | ICD-10-CM | POA: Diagnosis not present

## 2022-02-08 DIAGNOSIS — F0150 Vascular dementia without behavioral disturbance: Secondary | ICD-10-CM | POA: Diagnosis not present

## 2022-02-08 DIAGNOSIS — G309 Alzheimer's disease, unspecified: Secondary | ICD-10-CM | POA: Diagnosis not present

## 2022-02-08 DIAGNOSIS — D631 Anemia in chronic kidney disease: Secondary | ICD-10-CM | POA: Diagnosis not present

## 2022-02-08 DIAGNOSIS — F0280 Dementia in other diseases classified elsewhere without behavioral disturbance: Secondary | ICD-10-CM | POA: Diagnosis not present

## 2022-02-10 DIAGNOSIS — I2510 Atherosclerotic heart disease of native coronary artery without angina pectoris: Secondary | ICD-10-CM | POA: Diagnosis not present

## 2022-02-10 DIAGNOSIS — K449 Diaphragmatic hernia without obstruction or gangrene: Secondary | ICD-10-CM | POA: Diagnosis not present

## 2022-02-10 DIAGNOSIS — F0150 Vascular dementia without behavioral disturbance: Secondary | ICD-10-CM | POA: Diagnosis not present

## 2022-02-10 DIAGNOSIS — Z6830 Body mass index (BMI) 30.0-30.9, adult: Secondary | ICD-10-CM | POA: Diagnosis not present

## 2022-02-10 DIAGNOSIS — N400 Enlarged prostate without lower urinary tract symptoms: Secondary | ICD-10-CM | POA: Diagnosis not present

## 2022-02-10 DIAGNOSIS — K219 Gastro-esophageal reflux disease without esophagitis: Secondary | ICD-10-CM | POA: Diagnosis not present

## 2022-02-10 DIAGNOSIS — D631 Anemia in chronic kidney disease: Secondary | ICD-10-CM | POA: Diagnosis not present

## 2022-02-10 DIAGNOSIS — G309 Alzheimer's disease, unspecified: Secondary | ICD-10-CM | POA: Diagnosis not present

## 2022-02-10 DIAGNOSIS — M1990 Unspecified osteoarthritis, unspecified site: Secondary | ICD-10-CM | POA: Diagnosis not present

## 2022-02-10 DIAGNOSIS — E43 Unspecified severe protein-calorie malnutrition: Secondary | ICD-10-CM | POA: Diagnosis not present

## 2022-02-10 DIAGNOSIS — E1122 Type 2 diabetes mellitus with diabetic chronic kidney disease: Secondary | ICD-10-CM | POA: Diagnosis not present

## 2022-02-10 DIAGNOSIS — E785 Hyperlipidemia, unspecified: Secondary | ICD-10-CM | POA: Diagnosis not present

## 2022-02-10 DIAGNOSIS — F0280 Dementia in other diseases classified elsewhere without behavioral disturbance: Secondary | ICD-10-CM | POA: Diagnosis not present

## 2022-02-10 DIAGNOSIS — I252 Old myocardial infarction: Secondary | ICD-10-CM | POA: Diagnosis not present

## 2022-02-10 DIAGNOSIS — J8410 Pulmonary fibrosis, unspecified: Secondary | ICD-10-CM | POA: Diagnosis not present

## 2022-02-10 DIAGNOSIS — K2289 Other specified disease of esophagus: Secondary | ICD-10-CM | POA: Diagnosis not present

## 2022-02-10 DIAGNOSIS — N1831 Chronic kidney disease, stage 3a: Secondary | ICD-10-CM | POA: Diagnosis not present

## 2022-02-10 DIAGNOSIS — C3492 Malignant neoplasm of unspecified part of left bronchus or lung: Secondary | ICD-10-CM | POA: Diagnosis not present

## 2022-02-12 DIAGNOSIS — C3492 Malignant neoplasm of unspecified part of left bronchus or lung: Secondary | ICD-10-CM | POA: Diagnosis not present

## 2022-02-12 DIAGNOSIS — G309 Alzheimer's disease, unspecified: Secondary | ICD-10-CM | POA: Diagnosis not present

## 2022-02-12 DIAGNOSIS — D631 Anemia in chronic kidney disease: Secondary | ICD-10-CM | POA: Diagnosis not present

## 2022-02-12 DIAGNOSIS — F0280 Dementia in other diseases classified elsewhere without behavioral disturbance: Secondary | ICD-10-CM | POA: Diagnosis not present

## 2022-02-12 DIAGNOSIS — F0150 Vascular dementia without behavioral disturbance: Secondary | ICD-10-CM | POA: Diagnosis not present

## 2022-02-12 DIAGNOSIS — K2289 Other specified disease of esophagus: Secondary | ICD-10-CM | POA: Diagnosis not present

## 2022-02-13 DIAGNOSIS — C3492 Malignant neoplasm of unspecified part of left bronchus or lung: Secondary | ICD-10-CM | POA: Diagnosis not present

## 2022-02-13 DIAGNOSIS — K2289 Other specified disease of esophagus: Secondary | ICD-10-CM | POA: Diagnosis not present

## 2022-02-13 DIAGNOSIS — G309 Alzheimer's disease, unspecified: Secondary | ICD-10-CM | POA: Diagnosis not present

## 2022-02-13 DIAGNOSIS — D631 Anemia in chronic kidney disease: Secondary | ICD-10-CM | POA: Diagnosis not present

## 2022-02-13 DIAGNOSIS — F0150 Vascular dementia without behavioral disturbance: Secondary | ICD-10-CM | POA: Diagnosis not present

## 2022-02-13 DIAGNOSIS — F0280 Dementia in other diseases classified elsewhere without behavioral disturbance: Secondary | ICD-10-CM | POA: Diagnosis not present

## 2022-02-14 DIAGNOSIS — G309 Alzheimer's disease, unspecified: Secondary | ICD-10-CM | POA: Diagnosis not present

## 2022-02-14 DIAGNOSIS — D631 Anemia in chronic kidney disease: Secondary | ICD-10-CM | POA: Diagnosis not present

## 2022-02-14 DIAGNOSIS — F0150 Vascular dementia without behavioral disturbance: Secondary | ICD-10-CM | POA: Diagnosis not present

## 2022-02-14 DIAGNOSIS — C3492 Malignant neoplasm of unspecified part of left bronchus or lung: Secondary | ICD-10-CM | POA: Diagnosis not present

## 2022-02-14 DIAGNOSIS — K2289 Other specified disease of esophagus: Secondary | ICD-10-CM | POA: Diagnosis not present

## 2022-02-14 DIAGNOSIS — F0280 Dementia in other diseases classified elsewhere without behavioral disturbance: Secondary | ICD-10-CM | POA: Diagnosis not present

## 2022-02-19 DIAGNOSIS — C3492 Malignant neoplasm of unspecified part of left bronchus or lung: Secondary | ICD-10-CM | POA: Diagnosis not present

## 2022-02-19 DIAGNOSIS — K2289 Other specified disease of esophagus: Secondary | ICD-10-CM | POA: Diagnosis not present

## 2022-02-19 DIAGNOSIS — G309 Alzheimer's disease, unspecified: Secondary | ICD-10-CM | POA: Diagnosis not present

## 2022-02-19 DIAGNOSIS — F0150 Vascular dementia without behavioral disturbance: Secondary | ICD-10-CM | POA: Diagnosis not present

## 2022-02-19 DIAGNOSIS — D631 Anemia in chronic kidney disease: Secondary | ICD-10-CM | POA: Diagnosis not present

## 2022-02-19 DIAGNOSIS — F0280 Dementia in other diseases classified elsewhere without behavioral disturbance: Secondary | ICD-10-CM | POA: Diagnosis not present

## 2022-02-20 DIAGNOSIS — C3492 Malignant neoplasm of unspecified part of left bronchus or lung: Secondary | ICD-10-CM | POA: Diagnosis not present

## 2022-02-20 DIAGNOSIS — K2289 Other specified disease of esophagus: Secondary | ICD-10-CM | POA: Diagnosis not present

## 2022-02-20 DIAGNOSIS — D631 Anemia in chronic kidney disease: Secondary | ICD-10-CM | POA: Diagnosis not present

## 2022-02-20 DIAGNOSIS — G309 Alzheimer's disease, unspecified: Secondary | ICD-10-CM | POA: Diagnosis not present

## 2022-02-20 DIAGNOSIS — F0150 Vascular dementia without behavioral disturbance: Secondary | ICD-10-CM | POA: Diagnosis not present

## 2022-02-20 DIAGNOSIS — F0280 Dementia in other diseases classified elsewhere without behavioral disturbance: Secondary | ICD-10-CM | POA: Diagnosis not present

## 2022-02-21 DIAGNOSIS — C3492 Malignant neoplasm of unspecified part of left bronchus or lung: Secondary | ICD-10-CM | POA: Diagnosis not present

## 2022-02-21 DIAGNOSIS — F0150 Vascular dementia without behavioral disturbance: Secondary | ICD-10-CM | POA: Diagnosis not present

## 2022-02-21 DIAGNOSIS — Z20822 Contact with and (suspected) exposure to covid-19: Secondary | ICD-10-CM | POA: Diagnosis not present

## 2022-02-21 DIAGNOSIS — D631 Anemia in chronic kidney disease: Secondary | ICD-10-CM | POA: Diagnosis not present

## 2022-02-21 DIAGNOSIS — K2289 Other specified disease of esophagus: Secondary | ICD-10-CM | POA: Diagnosis not present

## 2022-02-21 DIAGNOSIS — G309 Alzheimer's disease, unspecified: Secondary | ICD-10-CM | POA: Diagnosis not present

## 2022-02-21 DIAGNOSIS — F0280 Dementia in other diseases classified elsewhere without behavioral disturbance: Secondary | ICD-10-CM | POA: Diagnosis not present

## 2022-02-21 DIAGNOSIS — R059 Cough, unspecified: Secondary | ICD-10-CM | POA: Diagnosis not present

## 2022-02-21 DIAGNOSIS — R051 Acute cough: Secondary | ICD-10-CM | POA: Diagnosis not present

## 2022-02-26 DIAGNOSIS — D631 Anemia in chronic kidney disease: Secondary | ICD-10-CM | POA: Diagnosis not present

## 2022-02-26 DIAGNOSIS — G309 Alzheimer's disease, unspecified: Secondary | ICD-10-CM | POA: Diagnosis not present

## 2022-02-26 DIAGNOSIS — C3492 Malignant neoplasm of unspecified part of left bronchus or lung: Secondary | ICD-10-CM | POA: Diagnosis not present

## 2022-02-26 DIAGNOSIS — Z20822 Contact with and (suspected) exposure to covid-19: Secondary | ICD-10-CM | POA: Diagnosis not present

## 2022-02-26 DIAGNOSIS — K2289 Other specified disease of esophagus: Secondary | ICD-10-CM | POA: Diagnosis not present

## 2022-02-26 DIAGNOSIS — F0150 Vascular dementia without behavioral disturbance: Secondary | ICD-10-CM | POA: Diagnosis not present

## 2022-02-26 DIAGNOSIS — F0280 Dementia in other diseases classified elsewhere without behavioral disturbance: Secondary | ICD-10-CM | POA: Diagnosis not present

## 2022-02-28 DIAGNOSIS — L570 Actinic keratosis: Secondary | ICD-10-CM | POA: Diagnosis not present

## 2022-02-28 DIAGNOSIS — D631 Anemia in chronic kidney disease: Secondary | ICD-10-CM | POA: Diagnosis not present

## 2022-02-28 DIAGNOSIS — L718 Other rosacea: Secondary | ICD-10-CM | POA: Diagnosis not present

## 2022-02-28 DIAGNOSIS — C3492 Malignant neoplasm of unspecified part of left bronchus or lung: Secondary | ICD-10-CM | POA: Diagnosis not present

## 2022-02-28 DIAGNOSIS — K2289 Other specified disease of esophagus: Secondary | ICD-10-CM | POA: Diagnosis not present

## 2022-02-28 DIAGNOSIS — G309 Alzheimer's disease, unspecified: Secondary | ICD-10-CM | POA: Diagnosis not present

## 2022-02-28 DIAGNOSIS — L905 Scar conditions and fibrosis of skin: Secondary | ICD-10-CM | POA: Diagnosis not present

## 2022-02-28 DIAGNOSIS — F0150 Vascular dementia without behavioral disturbance: Secondary | ICD-10-CM | POA: Diagnosis not present

## 2022-02-28 DIAGNOSIS — F0280 Dementia in other diseases classified elsewhere without behavioral disturbance: Secondary | ICD-10-CM | POA: Diagnosis not present

## 2022-03-01 DIAGNOSIS — F0150 Vascular dementia without behavioral disturbance: Secondary | ICD-10-CM | POA: Diagnosis not present

## 2022-03-01 DIAGNOSIS — F0280 Dementia in other diseases classified elsewhere without behavioral disturbance: Secondary | ICD-10-CM | POA: Diagnosis not present

## 2022-03-01 DIAGNOSIS — K2289 Other specified disease of esophagus: Secondary | ICD-10-CM | POA: Diagnosis not present

## 2022-03-01 DIAGNOSIS — C3492 Malignant neoplasm of unspecified part of left bronchus or lung: Secondary | ICD-10-CM | POA: Diagnosis not present

## 2022-03-01 DIAGNOSIS — G309 Alzheimer's disease, unspecified: Secondary | ICD-10-CM | POA: Diagnosis not present

## 2022-03-01 DIAGNOSIS — D631 Anemia in chronic kidney disease: Secondary | ICD-10-CM | POA: Diagnosis not present

## 2022-03-05 DIAGNOSIS — G309 Alzheimer's disease, unspecified: Secondary | ICD-10-CM | POA: Diagnosis not present

## 2022-03-05 DIAGNOSIS — G301 Alzheimer's disease with late onset: Secondary | ICD-10-CM | POA: Diagnosis not present

## 2022-03-05 DIAGNOSIS — K2289 Other specified disease of esophagus: Secondary | ICD-10-CM | POA: Diagnosis not present

## 2022-03-05 DIAGNOSIS — C3490 Malignant neoplasm of unspecified part of unspecified bronchus or lung: Secondary | ICD-10-CM | POA: Diagnosis not present

## 2022-03-05 DIAGNOSIS — E1141 Type 2 diabetes mellitus with diabetic mononeuropathy: Secondary | ICD-10-CM | POA: Diagnosis not present

## 2022-03-05 DIAGNOSIS — F0280 Dementia in other diseases classified elsewhere without behavioral disturbance: Secondary | ICD-10-CM | POA: Diagnosis not present

## 2022-03-05 DIAGNOSIS — C3492 Malignant neoplasm of unspecified part of left bronchus or lung: Secondary | ICD-10-CM | POA: Diagnosis not present

## 2022-03-05 DIAGNOSIS — D631 Anemia in chronic kidney disease: Secondary | ICD-10-CM | POA: Diagnosis not present

## 2022-03-05 DIAGNOSIS — N1831 Chronic kidney disease, stage 3a: Secondary | ICD-10-CM | POA: Diagnosis not present

## 2022-03-05 DIAGNOSIS — J8410 Pulmonary fibrosis, unspecified: Secondary | ICD-10-CM | POA: Diagnosis not present

## 2022-03-05 DIAGNOSIS — F0150 Vascular dementia without behavioral disturbance: Secondary | ICD-10-CM | POA: Diagnosis not present

## 2022-03-07 DIAGNOSIS — K2289 Other specified disease of esophagus: Secondary | ICD-10-CM | POA: Diagnosis not present

## 2022-03-07 DIAGNOSIS — D631 Anemia in chronic kidney disease: Secondary | ICD-10-CM | POA: Diagnosis not present

## 2022-03-07 DIAGNOSIS — F0280 Dementia in other diseases classified elsewhere without behavioral disturbance: Secondary | ICD-10-CM | POA: Diagnosis not present

## 2022-03-07 DIAGNOSIS — C3492 Malignant neoplasm of unspecified part of left bronchus or lung: Secondary | ICD-10-CM | POA: Diagnosis not present

## 2022-03-07 DIAGNOSIS — G309 Alzheimer's disease, unspecified: Secondary | ICD-10-CM | POA: Diagnosis not present

## 2022-03-07 DIAGNOSIS — F0150 Vascular dementia without behavioral disturbance: Secondary | ICD-10-CM | POA: Diagnosis not present

## 2022-03-12 DIAGNOSIS — K2289 Other specified disease of esophagus: Secondary | ICD-10-CM | POA: Diagnosis not present

## 2022-03-12 DIAGNOSIS — J8410 Pulmonary fibrosis, unspecified: Secondary | ICD-10-CM | POA: Diagnosis not present

## 2022-03-12 DIAGNOSIS — I2510 Atherosclerotic heart disease of native coronary artery without angina pectoris: Secondary | ICD-10-CM | POA: Diagnosis not present

## 2022-03-12 DIAGNOSIS — F0280 Dementia in other diseases classified elsewhere without behavioral disturbance: Secondary | ICD-10-CM | POA: Diagnosis not present

## 2022-03-12 DIAGNOSIS — E43 Unspecified severe protein-calorie malnutrition: Secondary | ICD-10-CM | POA: Diagnosis not present

## 2022-03-12 DIAGNOSIS — C3492 Malignant neoplasm of unspecified part of left bronchus or lung: Secondary | ICD-10-CM | POA: Diagnosis not present

## 2022-03-12 DIAGNOSIS — M1990 Unspecified osteoarthritis, unspecified site: Secondary | ICD-10-CM | POA: Diagnosis not present

## 2022-03-12 DIAGNOSIS — F0150 Vascular dementia without behavioral disturbance: Secondary | ICD-10-CM | POA: Diagnosis not present

## 2022-03-12 DIAGNOSIS — E1122 Type 2 diabetes mellitus with diabetic chronic kidney disease: Secondary | ICD-10-CM | POA: Diagnosis not present

## 2022-03-12 DIAGNOSIS — K219 Gastro-esophageal reflux disease without esophagitis: Secondary | ICD-10-CM | POA: Diagnosis not present

## 2022-03-12 DIAGNOSIS — I252 Old myocardial infarction: Secondary | ICD-10-CM | POA: Diagnosis not present

## 2022-03-12 DIAGNOSIS — G309 Alzheimer's disease, unspecified: Secondary | ICD-10-CM | POA: Diagnosis not present

## 2022-03-12 DIAGNOSIS — D631 Anemia in chronic kidney disease: Secondary | ICD-10-CM | POA: Diagnosis not present

## 2022-03-12 DIAGNOSIS — E785 Hyperlipidemia, unspecified: Secondary | ICD-10-CM | POA: Diagnosis not present

## 2022-03-12 DIAGNOSIS — K449 Diaphragmatic hernia without obstruction or gangrene: Secondary | ICD-10-CM | POA: Diagnosis not present

## 2022-03-12 DIAGNOSIS — N1831 Chronic kidney disease, stage 3a: Secondary | ICD-10-CM | POA: Diagnosis not present

## 2022-03-12 DIAGNOSIS — Z6830 Body mass index (BMI) 30.0-30.9, adult: Secondary | ICD-10-CM | POA: Diagnosis not present

## 2022-03-12 DIAGNOSIS — N400 Enlarged prostate without lower urinary tract symptoms: Secondary | ICD-10-CM | POA: Diagnosis not present

## 2022-03-14 DIAGNOSIS — D631 Anemia in chronic kidney disease: Secondary | ICD-10-CM | POA: Diagnosis not present

## 2022-03-14 DIAGNOSIS — C3492 Malignant neoplasm of unspecified part of left bronchus or lung: Secondary | ICD-10-CM | POA: Diagnosis not present

## 2022-03-14 DIAGNOSIS — G309 Alzheimer's disease, unspecified: Secondary | ICD-10-CM | POA: Diagnosis not present

## 2022-03-14 DIAGNOSIS — F0150 Vascular dementia without behavioral disturbance: Secondary | ICD-10-CM | POA: Diagnosis not present

## 2022-03-14 DIAGNOSIS — F0280 Dementia in other diseases classified elsewhere without behavioral disturbance: Secondary | ICD-10-CM | POA: Diagnosis not present

## 2022-03-14 DIAGNOSIS — K2289 Other specified disease of esophagus: Secondary | ICD-10-CM | POA: Diagnosis not present

## 2022-03-19 DIAGNOSIS — F0280 Dementia in other diseases classified elsewhere without behavioral disturbance: Secondary | ICD-10-CM | POA: Diagnosis not present

## 2022-03-19 DIAGNOSIS — Z20822 Contact with and (suspected) exposure to covid-19: Secondary | ICD-10-CM | POA: Diagnosis not present

## 2022-03-19 DIAGNOSIS — D631 Anemia in chronic kidney disease: Secondary | ICD-10-CM | POA: Diagnosis not present

## 2022-03-19 DIAGNOSIS — F0150 Vascular dementia without behavioral disturbance: Secondary | ICD-10-CM | POA: Diagnosis not present

## 2022-03-19 DIAGNOSIS — G309 Alzheimer's disease, unspecified: Secondary | ICD-10-CM | POA: Diagnosis not present

## 2022-03-19 DIAGNOSIS — C3492 Malignant neoplasm of unspecified part of left bronchus or lung: Secondary | ICD-10-CM | POA: Diagnosis not present

## 2022-03-19 DIAGNOSIS — K2289 Other specified disease of esophagus: Secondary | ICD-10-CM | POA: Diagnosis not present

## 2022-03-20 DIAGNOSIS — F0150 Vascular dementia without behavioral disturbance: Secondary | ICD-10-CM | POA: Diagnosis not present

## 2022-03-20 DIAGNOSIS — G309 Alzheimer's disease, unspecified: Secondary | ICD-10-CM | POA: Diagnosis not present

## 2022-03-20 DIAGNOSIS — C3492 Malignant neoplasm of unspecified part of left bronchus or lung: Secondary | ICD-10-CM | POA: Diagnosis not present

## 2022-03-20 DIAGNOSIS — D631 Anemia in chronic kidney disease: Secondary | ICD-10-CM | POA: Diagnosis not present

## 2022-03-20 DIAGNOSIS — F0280 Dementia in other diseases classified elsewhere without behavioral disturbance: Secondary | ICD-10-CM | POA: Diagnosis not present

## 2022-03-20 DIAGNOSIS — K2289 Other specified disease of esophagus: Secondary | ICD-10-CM | POA: Diagnosis not present

## 2022-03-21 DIAGNOSIS — C3492 Malignant neoplasm of unspecified part of left bronchus or lung: Secondary | ICD-10-CM | POA: Diagnosis not present

## 2022-03-21 DIAGNOSIS — G309 Alzheimer's disease, unspecified: Secondary | ICD-10-CM | POA: Diagnosis not present

## 2022-03-21 DIAGNOSIS — F0150 Vascular dementia without behavioral disturbance: Secondary | ICD-10-CM | POA: Diagnosis not present

## 2022-03-21 DIAGNOSIS — K2289 Other specified disease of esophagus: Secondary | ICD-10-CM | POA: Diagnosis not present

## 2022-03-21 DIAGNOSIS — D631 Anemia in chronic kidney disease: Secondary | ICD-10-CM | POA: Diagnosis not present

## 2022-03-21 DIAGNOSIS — F0280 Dementia in other diseases classified elsewhere without behavioral disturbance: Secondary | ICD-10-CM | POA: Diagnosis not present

## 2022-03-26 DIAGNOSIS — G309 Alzheimer's disease, unspecified: Secondary | ICD-10-CM | POA: Diagnosis not present

## 2022-03-26 DIAGNOSIS — K2289 Other specified disease of esophagus: Secondary | ICD-10-CM | POA: Diagnosis not present

## 2022-03-26 DIAGNOSIS — D631 Anemia in chronic kidney disease: Secondary | ICD-10-CM | POA: Diagnosis not present

## 2022-03-26 DIAGNOSIS — F0150 Vascular dementia without behavioral disturbance: Secondary | ICD-10-CM | POA: Diagnosis not present

## 2022-03-26 DIAGNOSIS — F0280 Dementia in other diseases classified elsewhere without behavioral disturbance: Secondary | ICD-10-CM | POA: Diagnosis not present

## 2022-03-26 DIAGNOSIS — C3492 Malignant neoplasm of unspecified part of left bronchus or lung: Secondary | ICD-10-CM | POA: Diagnosis not present

## 2022-03-27 DIAGNOSIS — K2289 Other specified disease of esophagus: Secondary | ICD-10-CM | POA: Diagnosis not present

## 2022-03-27 DIAGNOSIS — F0150 Vascular dementia without behavioral disturbance: Secondary | ICD-10-CM | POA: Diagnosis not present

## 2022-03-27 DIAGNOSIS — G309 Alzheimer's disease, unspecified: Secondary | ICD-10-CM | POA: Diagnosis not present

## 2022-03-27 DIAGNOSIS — D631 Anemia in chronic kidney disease: Secondary | ICD-10-CM | POA: Diagnosis not present

## 2022-03-27 DIAGNOSIS — F0280 Dementia in other diseases classified elsewhere without behavioral disturbance: Secondary | ICD-10-CM | POA: Diagnosis not present

## 2022-03-27 DIAGNOSIS — C3492 Malignant neoplasm of unspecified part of left bronchus or lung: Secondary | ICD-10-CM | POA: Diagnosis not present

## 2022-03-28 DIAGNOSIS — C7800 Secondary malignant neoplasm of unspecified lung: Secondary | ICD-10-CM | POA: Diagnosis not present

## 2022-03-28 DIAGNOSIS — C3492 Malignant neoplasm of unspecified part of left bronchus or lung: Secondary | ICD-10-CM | POA: Diagnosis not present

## 2022-03-28 DIAGNOSIS — E1121 Type 2 diabetes mellitus with diabetic nephropathy: Secondary | ICD-10-CM | POA: Diagnosis not present

## 2022-03-28 DIAGNOSIS — N401 Enlarged prostate with lower urinary tract symptoms: Secondary | ICD-10-CM | POA: Diagnosis not present

## 2022-03-28 DIAGNOSIS — Z8673 Personal history of transient ischemic attack (TIA), and cerebral infarction without residual deficits: Secondary | ICD-10-CM | POA: Diagnosis not present

## 2022-03-28 DIAGNOSIS — F0150 Vascular dementia without behavioral disturbance: Secondary | ICD-10-CM | POA: Diagnosis not present

## 2022-03-28 DIAGNOSIS — K219 Gastro-esophageal reflux disease without esophagitis: Secondary | ICD-10-CM | POA: Diagnosis not present

## 2022-03-28 DIAGNOSIS — G479 Sleep disorder, unspecified: Secondary | ICD-10-CM | POA: Diagnosis not present

## 2022-03-28 DIAGNOSIS — J8410 Pulmonary fibrosis, unspecified: Secondary | ICD-10-CM | POA: Diagnosis not present

## 2022-03-28 DIAGNOSIS — D631 Anemia in chronic kidney disease: Secondary | ICD-10-CM | POA: Diagnosis not present

## 2022-03-28 DIAGNOSIS — K2289 Other specified disease of esophagus: Secondary | ICD-10-CM | POA: Diagnosis not present

## 2022-03-28 DIAGNOSIS — D649 Anemia, unspecified: Secondary | ICD-10-CM | POA: Diagnosis not present

## 2022-03-28 DIAGNOSIS — F0280 Dementia in other diseases classified elsewhere without behavioral disturbance: Secondary | ICD-10-CM | POA: Diagnosis not present

## 2022-03-28 DIAGNOSIS — M1990 Unspecified osteoarthritis, unspecified site: Secondary | ICD-10-CM | POA: Diagnosis not present

## 2022-03-28 DIAGNOSIS — G309 Alzheimer's disease, unspecified: Secondary | ICD-10-CM | POA: Diagnosis not present

## 2022-03-28 DIAGNOSIS — N189 Chronic kidney disease, unspecified: Secondary | ICD-10-CM | POA: Diagnosis not present

## 2022-04-02 DIAGNOSIS — G309 Alzheimer's disease, unspecified: Secondary | ICD-10-CM | POA: Diagnosis not present

## 2022-04-02 DIAGNOSIS — C3492 Malignant neoplasm of unspecified part of left bronchus or lung: Secondary | ICD-10-CM | POA: Diagnosis not present

## 2022-04-02 DIAGNOSIS — D631 Anemia in chronic kidney disease: Secondary | ICD-10-CM | POA: Diagnosis not present

## 2022-04-02 DIAGNOSIS — K2289 Other specified disease of esophagus: Secondary | ICD-10-CM | POA: Diagnosis not present

## 2022-04-02 DIAGNOSIS — F0280 Dementia in other diseases classified elsewhere without behavioral disturbance: Secondary | ICD-10-CM | POA: Diagnosis not present

## 2022-04-02 DIAGNOSIS — F0150 Vascular dementia without behavioral disturbance: Secondary | ICD-10-CM | POA: Diagnosis not present

## 2022-04-04 DIAGNOSIS — F0150 Vascular dementia without behavioral disturbance: Secondary | ICD-10-CM | POA: Diagnosis not present

## 2022-04-04 DIAGNOSIS — G309 Alzheimer's disease, unspecified: Secondary | ICD-10-CM | POA: Diagnosis not present

## 2022-04-04 DIAGNOSIS — L570 Actinic keratosis: Secondary | ICD-10-CM | POA: Diagnosis not present

## 2022-04-04 DIAGNOSIS — L853 Xerosis cutis: Secondary | ICD-10-CM | POA: Diagnosis not present

## 2022-04-04 DIAGNOSIS — K2289 Other specified disease of esophagus: Secondary | ICD-10-CM | POA: Diagnosis not present

## 2022-04-04 DIAGNOSIS — F0280 Dementia in other diseases classified elsewhere without behavioral disturbance: Secondary | ICD-10-CM | POA: Diagnosis not present

## 2022-04-04 DIAGNOSIS — D631 Anemia in chronic kidney disease: Secondary | ICD-10-CM | POA: Diagnosis not present

## 2022-04-04 DIAGNOSIS — C3492 Malignant neoplasm of unspecified part of left bronchus or lung: Secondary | ICD-10-CM | POA: Diagnosis not present

## 2022-04-09 DIAGNOSIS — G309 Alzheimer's disease, unspecified: Secondary | ICD-10-CM | POA: Diagnosis not present

## 2022-04-09 DIAGNOSIS — K2289 Other specified disease of esophagus: Secondary | ICD-10-CM | POA: Diagnosis not present

## 2022-04-09 DIAGNOSIS — C3492 Malignant neoplasm of unspecified part of left bronchus or lung: Secondary | ICD-10-CM | POA: Diagnosis not present

## 2022-04-09 DIAGNOSIS — F0280 Dementia in other diseases classified elsewhere without behavioral disturbance: Secondary | ICD-10-CM | POA: Diagnosis not present

## 2022-04-09 DIAGNOSIS — D631 Anemia in chronic kidney disease: Secondary | ICD-10-CM | POA: Diagnosis not present

## 2022-04-09 DIAGNOSIS — F0150 Vascular dementia without behavioral disturbance: Secondary | ICD-10-CM | POA: Diagnosis not present

## 2022-04-10 DIAGNOSIS — Z23 Encounter for immunization: Secondary | ICD-10-CM | POA: Diagnosis not present

## 2022-04-11 DIAGNOSIS — G309 Alzheimer's disease, unspecified: Secondary | ICD-10-CM | POA: Diagnosis not present

## 2022-04-11 DIAGNOSIS — D631 Anemia in chronic kidney disease: Secondary | ICD-10-CM | POA: Diagnosis not present

## 2022-04-11 DIAGNOSIS — F0150 Vascular dementia without behavioral disturbance: Secondary | ICD-10-CM | POA: Diagnosis not present

## 2022-04-11 DIAGNOSIS — K2289 Other specified disease of esophagus: Secondary | ICD-10-CM | POA: Diagnosis not present

## 2022-04-11 DIAGNOSIS — C3492 Malignant neoplasm of unspecified part of left bronchus or lung: Secondary | ICD-10-CM | POA: Diagnosis not present

## 2022-04-11 DIAGNOSIS — F0280 Dementia in other diseases classified elsewhere without behavioral disturbance: Secondary | ICD-10-CM | POA: Diagnosis not present

## 2022-04-12 DIAGNOSIS — C3492 Malignant neoplasm of unspecified part of left bronchus or lung: Secondary | ICD-10-CM | POA: Diagnosis not present

## 2022-04-12 DIAGNOSIS — K449 Diaphragmatic hernia without obstruction or gangrene: Secondary | ICD-10-CM | POA: Diagnosis not present

## 2022-04-12 DIAGNOSIS — N400 Enlarged prostate without lower urinary tract symptoms: Secondary | ICD-10-CM | POA: Diagnosis not present

## 2022-04-12 DIAGNOSIS — E1122 Type 2 diabetes mellitus with diabetic chronic kidney disease: Secondary | ICD-10-CM | POA: Diagnosis not present

## 2022-04-12 DIAGNOSIS — Z6830 Body mass index (BMI) 30.0-30.9, adult: Secondary | ICD-10-CM | POA: Diagnosis not present

## 2022-04-12 DIAGNOSIS — N1831 Chronic kidney disease, stage 3a: Secondary | ICD-10-CM | POA: Diagnosis not present

## 2022-04-12 DIAGNOSIS — G309 Alzheimer's disease, unspecified: Secondary | ICD-10-CM | POA: Diagnosis not present

## 2022-04-12 DIAGNOSIS — I252 Old myocardial infarction: Secondary | ICD-10-CM | POA: Diagnosis not present

## 2022-04-12 DIAGNOSIS — E43 Unspecified severe protein-calorie malnutrition: Secondary | ICD-10-CM | POA: Diagnosis not present

## 2022-04-12 DIAGNOSIS — E785 Hyperlipidemia, unspecified: Secondary | ICD-10-CM | POA: Diagnosis not present

## 2022-04-12 DIAGNOSIS — D631 Anemia in chronic kidney disease: Secondary | ICD-10-CM | POA: Diagnosis not present

## 2022-04-12 DIAGNOSIS — K219 Gastro-esophageal reflux disease without esophagitis: Secondary | ICD-10-CM | POA: Diagnosis not present

## 2022-04-12 DIAGNOSIS — I2510 Atherosclerotic heart disease of native coronary artery without angina pectoris: Secondary | ICD-10-CM | POA: Diagnosis not present

## 2022-04-12 DIAGNOSIS — M1990 Unspecified osteoarthritis, unspecified site: Secondary | ICD-10-CM | POA: Diagnosis not present

## 2022-04-12 DIAGNOSIS — F0280 Dementia in other diseases classified elsewhere without behavioral disturbance: Secondary | ICD-10-CM | POA: Diagnosis not present

## 2022-04-12 DIAGNOSIS — J8410 Pulmonary fibrosis, unspecified: Secondary | ICD-10-CM | POA: Diagnosis not present

## 2022-04-12 DIAGNOSIS — K2289 Other specified disease of esophagus: Secondary | ICD-10-CM | POA: Diagnosis not present

## 2022-04-12 DIAGNOSIS — F0150 Vascular dementia without behavioral disturbance: Secondary | ICD-10-CM | POA: Diagnosis not present

## 2022-04-16 DIAGNOSIS — F0280 Dementia in other diseases classified elsewhere without behavioral disturbance: Secondary | ICD-10-CM | POA: Diagnosis not present

## 2022-04-16 DIAGNOSIS — F0150 Vascular dementia without behavioral disturbance: Secondary | ICD-10-CM | POA: Diagnosis not present

## 2022-04-16 DIAGNOSIS — K2289 Other specified disease of esophagus: Secondary | ICD-10-CM | POA: Diagnosis not present

## 2022-04-16 DIAGNOSIS — G309 Alzheimer's disease, unspecified: Secondary | ICD-10-CM | POA: Diagnosis not present

## 2022-04-16 DIAGNOSIS — C3492 Malignant neoplasm of unspecified part of left bronchus or lung: Secondary | ICD-10-CM | POA: Diagnosis not present

## 2022-04-16 DIAGNOSIS — D631 Anemia in chronic kidney disease: Secondary | ICD-10-CM | POA: Diagnosis not present

## 2022-04-18 DIAGNOSIS — G309 Alzheimer's disease, unspecified: Secondary | ICD-10-CM | POA: Diagnosis not present

## 2022-04-18 DIAGNOSIS — C3492 Malignant neoplasm of unspecified part of left bronchus or lung: Secondary | ICD-10-CM | POA: Diagnosis not present

## 2022-04-18 DIAGNOSIS — F0280 Dementia in other diseases classified elsewhere without behavioral disturbance: Secondary | ICD-10-CM | POA: Diagnosis not present

## 2022-04-18 DIAGNOSIS — D631 Anemia in chronic kidney disease: Secondary | ICD-10-CM | POA: Diagnosis not present

## 2022-04-18 DIAGNOSIS — F0150 Vascular dementia without behavioral disturbance: Secondary | ICD-10-CM | POA: Diagnosis not present

## 2022-04-18 DIAGNOSIS — K2289 Other specified disease of esophagus: Secondary | ICD-10-CM | POA: Diagnosis not present

## 2022-04-23 DIAGNOSIS — K2289 Other specified disease of esophagus: Secondary | ICD-10-CM | POA: Diagnosis not present

## 2022-04-23 DIAGNOSIS — F0150 Vascular dementia without behavioral disturbance: Secondary | ICD-10-CM | POA: Diagnosis not present

## 2022-04-23 DIAGNOSIS — D631 Anemia in chronic kidney disease: Secondary | ICD-10-CM | POA: Diagnosis not present

## 2022-04-23 DIAGNOSIS — G309 Alzheimer's disease, unspecified: Secondary | ICD-10-CM | POA: Diagnosis not present

## 2022-04-23 DIAGNOSIS — F0280 Dementia in other diseases classified elsewhere without behavioral disturbance: Secondary | ICD-10-CM | POA: Diagnosis not present

## 2022-04-23 DIAGNOSIS — C3492 Malignant neoplasm of unspecified part of left bronchus or lung: Secondary | ICD-10-CM | POA: Diagnosis not present

## 2022-04-25 DIAGNOSIS — K2289 Other specified disease of esophagus: Secondary | ICD-10-CM | POA: Diagnosis not present

## 2022-04-25 DIAGNOSIS — F0280 Dementia in other diseases classified elsewhere without behavioral disturbance: Secondary | ICD-10-CM | POA: Diagnosis not present

## 2022-04-25 DIAGNOSIS — F0150 Vascular dementia without behavioral disturbance: Secondary | ICD-10-CM | POA: Diagnosis not present

## 2022-04-25 DIAGNOSIS — C3492 Malignant neoplasm of unspecified part of left bronchus or lung: Secondary | ICD-10-CM | POA: Diagnosis not present

## 2022-04-25 DIAGNOSIS — G309 Alzheimer's disease, unspecified: Secondary | ICD-10-CM | POA: Diagnosis not present

## 2022-04-25 DIAGNOSIS — D631 Anemia in chronic kidney disease: Secondary | ICD-10-CM | POA: Diagnosis not present

## 2022-04-30 DIAGNOSIS — C3492 Malignant neoplasm of unspecified part of left bronchus or lung: Secondary | ICD-10-CM | POA: Diagnosis not present

## 2022-04-30 DIAGNOSIS — G309 Alzheimer's disease, unspecified: Secondary | ICD-10-CM | POA: Diagnosis not present

## 2022-04-30 DIAGNOSIS — K2289 Other specified disease of esophagus: Secondary | ICD-10-CM | POA: Diagnosis not present

## 2022-04-30 DIAGNOSIS — D631 Anemia in chronic kidney disease: Secondary | ICD-10-CM | POA: Diagnosis not present

## 2022-04-30 DIAGNOSIS — F0280 Dementia in other diseases classified elsewhere without behavioral disturbance: Secondary | ICD-10-CM | POA: Diagnosis not present

## 2022-04-30 DIAGNOSIS — F0150 Vascular dementia without behavioral disturbance: Secondary | ICD-10-CM | POA: Diagnosis not present

## 2022-05-02 DIAGNOSIS — C3492 Malignant neoplasm of unspecified part of left bronchus or lung: Secondary | ICD-10-CM | POA: Diagnosis not present

## 2022-05-02 DIAGNOSIS — K2289 Other specified disease of esophagus: Secondary | ICD-10-CM | POA: Diagnosis not present

## 2022-05-02 DIAGNOSIS — D631 Anemia in chronic kidney disease: Secondary | ICD-10-CM | POA: Diagnosis not present

## 2022-05-02 DIAGNOSIS — F0280 Dementia in other diseases classified elsewhere without behavioral disturbance: Secondary | ICD-10-CM | POA: Diagnosis not present

## 2022-05-02 DIAGNOSIS — F0150 Vascular dementia without behavioral disturbance: Secondary | ICD-10-CM | POA: Diagnosis not present

## 2022-05-02 DIAGNOSIS — G309 Alzheimer's disease, unspecified: Secondary | ICD-10-CM | POA: Diagnosis not present

## 2022-05-04 DIAGNOSIS — M1990 Unspecified osteoarthritis, unspecified site: Secondary | ICD-10-CM | POA: Diagnosis not present

## 2022-05-04 DIAGNOSIS — J8410 Pulmonary fibrosis, unspecified: Secondary | ICD-10-CM | POA: Diagnosis not present

## 2022-05-04 DIAGNOSIS — D649 Anemia, unspecified: Secondary | ICD-10-CM | POA: Diagnosis not present

## 2022-05-04 DIAGNOSIS — N401 Enlarged prostate with lower urinary tract symptoms: Secondary | ICD-10-CM | POA: Diagnosis not present

## 2022-05-04 DIAGNOSIS — K219 Gastro-esophageal reflux disease without esophagitis: Secondary | ICD-10-CM | POA: Diagnosis not present

## 2022-05-04 DIAGNOSIS — G479 Sleep disorder, unspecified: Secondary | ICD-10-CM | POA: Diagnosis not present

## 2022-05-04 DIAGNOSIS — Z8673 Personal history of transient ischemic attack (TIA), and cerebral infarction without residual deficits: Secondary | ICD-10-CM | POA: Diagnosis not present

## 2022-05-04 DIAGNOSIS — N183 Chronic kidney disease, stage 3 unspecified: Secondary | ICD-10-CM | POA: Diagnosis not present

## 2022-05-04 DIAGNOSIS — E1140 Type 2 diabetes mellitus with diabetic neuropathy, unspecified: Secondary | ICD-10-CM | POA: Diagnosis not present

## 2022-05-07 DIAGNOSIS — G309 Alzheimer's disease, unspecified: Secondary | ICD-10-CM | POA: Diagnosis not present

## 2022-05-07 DIAGNOSIS — K2289 Other specified disease of esophagus: Secondary | ICD-10-CM | POA: Diagnosis not present

## 2022-05-07 DIAGNOSIS — F0280 Dementia in other diseases classified elsewhere without behavioral disturbance: Secondary | ICD-10-CM | POA: Diagnosis not present

## 2022-05-07 DIAGNOSIS — F0150 Vascular dementia without behavioral disturbance: Secondary | ICD-10-CM | POA: Diagnosis not present

## 2022-05-07 DIAGNOSIS — D631 Anemia in chronic kidney disease: Secondary | ICD-10-CM | POA: Diagnosis not present

## 2022-05-07 DIAGNOSIS — C3492 Malignant neoplasm of unspecified part of left bronchus or lung: Secondary | ICD-10-CM | POA: Diagnosis not present

## 2022-05-08 DIAGNOSIS — L578 Other skin changes due to chronic exposure to nonionizing radiation: Secondary | ICD-10-CM | POA: Diagnosis not present

## 2022-05-12 DIAGNOSIS — K449 Diaphragmatic hernia without obstruction or gangrene: Secondary | ICD-10-CM | POA: Diagnosis not present

## 2022-05-12 DIAGNOSIS — R634 Abnormal weight loss: Secondary | ICD-10-CM | POA: Diagnosis not present

## 2022-05-12 DIAGNOSIS — M1990 Unspecified osteoarthritis, unspecified site: Secondary | ICD-10-CM | POA: Diagnosis not present

## 2022-05-12 DIAGNOSIS — F0150 Vascular dementia without behavioral disturbance: Secondary | ICD-10-CM | POA: Diagnosis not present

## 2022-05-12 DIAGNOSIS — I252 Old myocardial infarction: Secondary | ICD-10-CM | POA: Diagnosis not present

## 2022-05-12 DIAGNOSIS — J8410 Pulmonary fibrosis, unspecified: Secondary | ICD-10-CM | POA: Diagnosis not present

## 2022-05-12 DIAGNOSIS — E1122 Type 2 diabetes mellitus with diabetic chronic kidney disease: Secondary | ICD-10-CM | POA: Diagnosis not present

## 2022-05-12 DIAGNOSIS — N1831 Chronic kidney disease, stage 3a: Secondary | ICD-10-CM | POA: Diagnosis not present

## 2022-05-12 DIAGNOSIS — E43 Unspecified severe protein-calorie malnutrition: Secondary | ICD-10-CM | POA: Diagnosis not present

## 2022-05-12 DIAGNOSIS — F0280 Dementia in other diseases classified elsewhere without behavioral disturbance: Secondary | ICD-10-CM | POA: Diagnosis not present

## 2022-05-12 DIAGNOSIS — K219 Gastro-esophageal reflux disease without esophagitis: Secondary | ICD-10-CM | POA: Diagnosis not present

## 2022-05-12 DIAGNOSIS — K2289 Other specified disease of esophagus: Secondary | ICD-10-CM | POA: Diagnosis not present

## 2022-05-12 DIAGNOSIS — E785 Hyperlipidemia, unspecified: Secondary | ICD-10-CM | POA: Diagnosis not present

## 2022-05-12 DIAGNOSIS — C3492 Malignant neoplasm of unspecified part of left bronchus or lung: Secondary | ICD-10-CM | POA: Diagnosis not present

## 2022-05-12 DIAGNOSIS — R296 Repeated falls: Secondary | ICD-10-CM | POA: Diagnosis not present

## 2022-05-12 DIAGNOSIS — I2510 Atherosclerotic heart disease of native coronary artery without angina pectoris: Secondary | ICD-10-CM | POA: Diagnosis not present

## 2022-05-12 DIAGNOSIS — G309 Alzheimer's disease, unspecified: Secondary | ICD-10-CM | POA: Diagnosis not present

## 2022-05-12 DIAGNOSIS — N400 Enlarged prostate without lower urinary tract symptoms: Secondary | ICD-10-CM | POA: Diagnosis not present

## 2022-05-12 DIAGNOSIS — D631 Anemia in chronic kidney disease: Secondary | ICD-10-CM | POA: Diagnosis not present

## 2022-05-14 DIAGNOSIS — F0150 Vascular dementia without behavioral disturbance: Secondary | ICD-10-CM | POA: Diagnosis not present

## 2022-05-14 DIAGNOSIS — F0280 Dementia in other diseases classified elsewhere without behavioral disturbance: Secondary | ICD-10-CM | POA: Diagnosis not present

## 2022-05-14 DIAGNOSIS — K2289 Other specified disease of esophagus: Secondary | ICD-10-CM | POA: Diagnosis not present

## 2022-05-14 DIAGNOSIS — D631 Anemia in chronic kidney disease: Secondary | ICD-10-CM | POA: Diagnosis not present

## 2022-05-14 DIAGNOSIS — G309 Alzheimer's disease, unspecified: Secondary | ICD-10-CM | POA: Diagnosis not present

## 2022-05-14 DIAGNOSIS — C3492 Malignant neoplasm of unspecified part of left bronchus or lung: Secondary | ICD-10-CM | POA: Diagnosis not present

## 2022-05-15 DIAGNOSIS — D631 Anemia in chronic kidney disease: Secondary | ICD-10-CM | POA: Diagnosis not present

## 2022-05-15 DIAGNOSIS — F0280 Dementia in other diseases classified elsewhere without behavioral disturbance: Secondary | ICD-10-CM | POA: Diagnosis not present

## 2022-05-15 DIAGNOSIS — F0150 Vascular dementia without behavioral disturbance: Secondary | ICD-10-CM | POA: Diagnosis not present

## 2022-05-15 DIAGNOSIS — G309 Alzheimer's disease, unspecified: Secondary | ICD-10-CM | POA: Diagnosis not present

## 2022-05-15 DIAGNOSIS — K2289 Other specified disease of esophagus: Secondary | ICD-10-CM | POA: Diagnosis not present

## 2022-05-15 DIAGNOSIS — C3492 Malignant neoplasm of unspecified part of left bronchus or lung: Secondary | ICD-10-CM | POA: Diagnosis not present

## 2022-05-16 DIAGNOSIS — K2289 Other specified disease of esophagus: Secondary | ICD-10-CM | POA: Diagnosis not present

## 2022-05-16 DIAGNOSIS — C3492 Malignant neoplasm of unspecified part of left bronchus or lung: Secondary | ICD-10-CM | POA: Diagnosis not present

## 2022-05-16 DIAGNOSIS — F0280 Dementia in other diseases classified elsewhere without behavioral disturbance: Secondary | ICD-10-CM | POA: Diagnosis not present

## 2022-05-16 DIAGNOSIS — D631 Anemia in chronic kidney disease: Secondary | ICD-10-CM | POA: Diagnosis not present

## 2022-05-16 DIAGNOSIS — F0150 Vascular dementia without behavioral disturbance: Secondary | ICD-10-CM | POA: Diagnosis not present

## 2022-05-16 DIAGNOSIS — G309 Alzheimer's disease, unspecified: Secondary | ICD-10-CM | POA: Diagnosis not present

## 2022-05-21 DIAGNOSIS — C3492 Malignant neoplasm of unspecified part of left bronchus or lung: Secondary | ICD-10-CM | POA: Diagnosis not present

## 2022-05-21 DIAGNOSIS — F0280 Dementia in other diseases classified elsewhere without behavioral disturbance: Secondary | ICD-10-CM | POA: Diagnosis not present

## 2022-05-21 DIAGNOSIS — F0150 Vascular dementia without behavioral disturbance: Secondary | ICD-10-CM | POA: Diagnosis not present

## 2022-05-21 DIAGNOSIS — K2289 Other specified disease of esophagus: Secondary | ICD-10-CM | POA: Diagnosis not present

## 2022-05-21 DIAGNOSIS — D631 Anemia in chronic kidney disease: Secondary | ICD-10-CM | POA: Diagnosis not present

## 2022-05-21 DIAGNOSIS — G309 Alzheimer's disease, unspecified: Secondary | ICD-10-CM | POA: Diagnosis not present

## 2022-05-23 DIAGNOSIS — K2289 Other specified disease of esophagus: Secondary | ICD-10-CM | POA: Diagnosis not present

## 2022-05-23 DIAGNOSIS — G309 Alzheimer's disease, unspecified: Secondary | ICD-10-CM | POA: Diagnosis not present

## 2022-05-23 DIAGNOSIS — C3492 Malignant neoplasm of unspecified part of left bronchus or lung: Secondary | ICD-10-CM | POA: Diagnosis not present

## 2022-05-23 DIAGNOSIS — F0280 Dementia in other diseases classified elsewhere without behavioral disturbance: Secondary | ICD-10-CM | POA: Diagnosis not present

## 2022-05-23 DIAGNOSIS — F0150 Vascular dementia without behavioral disturbance: Secondary | ICD-10-CM | POA: Diagnosis not present

## 2022-05-23 DIAGNOSIS — D631 Anemia in chronic kidney disease: Secondary | ICD-10-CM | POA: Diagnosis not present

## 2022-05-24 DIAGNOSIS — D631 Anemia in chronic kidney disease: Secondary | ICD-10-CM | POA: Diagnosis not present

## 2022-05-24 DIAGNOSIS — N1831 Chronic kidney disease, stage 3a: Secondary | ICD-10-CM | POA: Diagnosis not present

## 2022-05-24 DIAGNOSIS — F39 Unspecified mood [affective] disorder: Secondary | ICD-10-CM | POA: Diagnosis not present

## 2022-05-24 DIAGNOSIS — K219 Gastro-esophageal reflux disease without esophagitis: Secondary | ICD-10-CM | POA: Diagnosis not present

## 2022-05-24 DIAGNOSIS — C3492 Malignant neoplasm of unspecified part of left bronchus or lung: Secondary | ICD-10-CM | POA: Diagnosis not present

## 2022-05-24 DIAGNOSIS — E1121 Type 2 diabetes mellitus with diabetic nephropathy: Secondary | ICD-10-CM | POA: Diagnosis not present

## 2022-05-24 DIAGNOSIS — F0280 Dementia in other diseases classified elsewhere without behavioral disturbance: Secondary | ICD-10-CM | POA: Diagnosis not present

## 2022-05-24 DIAGNOSIS — F0150 Vascular dementia without behavioral disturbance: Secondary | ICD-10-CM | POA: Diagnosis not present

## 2022-05-24 DIAGNOSIS — C7800 Secondary malignant neoplasm of unspecified lung: Secondary | ICD-10-CM | POA: Diagnosis not present

## 2022-05-24 DIAGNOSIS — G309 Alzheimer's disease, unspecified: Secondary | ICD-10-CM | POA: Diagnosis not present

## 2022-05-24 DIAGNOSIS — G301 Alzheimer's disease with late onset: Secondary | ICD-10-CM | POA: Diagnosis not present

## 2022-05-24 DIAGNOSIS — K2289 Other specified disease of esophagus: Secondary | ICD-10-CM | POA: Diagnosis not present

## 2022-05-28 DIAGNOSIS — D631 Anemia in chronic kidney disease: Secondary | ICD-10-CM | POA: Diagnosis not present

## 2022-05-28 DIAGNOSIS — K2289 Other specified disease of esophagus: Secondary | ICD-10-CM | POA: Diagnosis not present

## 2022-05-28 DIAGNOSIS — F0280 Dementia in other diseases classified elsewhere without behavioral disturbance: Secondary | ICD-10-CM | POA: Diagnosis not present

## 2022-05-28 DIAGNOSIS — C3492 Malignant neoplasm of unspecified part of left bronchus or lung: Secondary | ICD-10-CM | POA: Diagnosis not present

## 2022-05-28 DIAGNOSIS — F0150 Vascular dementia without behavioral disturbance: Secondary | ICD-10-CM | POA: Diagnosis not present

## 2022-05-28 DIAGNOSIS — G309 Alzheimer's disease, unspecified: Secondary | ICD-10-CM | POA: Diagnosis not present

## 2022-05-30 DIAGNOSIS — C3492 Malignant neoplasm of unspecified part of left bronchus or lung: Secondary | ICD-10-CM | POA: Diagnosis not present

## 2022-05-30 DIAGNOSIS — D631 Anemia in chronic kidney disease: Secondary | ICD-10-CM | POA: Diagnosis not present

## 2022-05-30 DIAGNOSIS — G309 Alzheimer's disease, unspecified: Secondary | ICD-10-CM | POA: Diagnosis not present

## 2022-05-30 DIAGNOSIS — F0280 Dementia in other diseases classified elsewhere without behavioral disturbance: Secondary | ICD-10-CM | POA: Diagnosis not present

## 2022-05-30 DIAGNOSIS — F0150 Vascular dementia without behavioral disturbance: Secondary | ICD-10-CM | POA: Diagnosis not present

## 2022-05-30 DIAGNOSIS — K2289 Other specified disease of esophagus: Secondary | ICD-10-CM | POA: Diagnosis not present

## 2022-06-04 DIAGNOSIS — C3492 Malignant neoplasm of unspecified part of left bronchus or lung: Secondary | ICD-10-CM | POA: Diagnosis not present

## 2022-06-04 DIAGNOSIS — K2289 Other specified disease of esophagus: Secondary | ICD-10-CM | POA: Diagnosis not present

## 2022-06-04 DIAGNOSIS — D631 Anemia in chronic kidney disease: Secondary | ICD-10-CM | POA: Diagnosis not present

## 2022-06-04 DIAGNOSIS — F0150 Vascular dementia without behavioral disturbance: Secondary | ICD-10-CM | POA: Diagnosis not present

## 2022-06-04 DIAGNOSIS — F0280 Dementia in other diseases classified elsewhere without behavioral disturbance: Secondary | ICD-10-CM | POA: Diagnosis not present

## 2022-06-04 DIAGNOSIS — G309 Alzheimer's disease, unspecified: Secondary | ICD-10-CM | POA: Diagnosis not present

## 2022-06-06 DIAGNOSIS — D631 Anemia in chronic kidney disease: Secondary | ICD-10-CM | POA: Diagnosis not present

## 2022-06-06 DIAGNOSIS — G309 Alzheimer's disease, unspecified: Secondary | ICD-10-CM | POA: Diagnosis not present

## 2022-06-06 DIAGNOSIS — K2289 Other specified disease of esophagus: Secondary | ICD-10-CM | POA: Diagnosis not present

## 2022-06-06 DIAGNOSIS — F0150 Vascular dementia without behavioral disturbance: Secondary | ICD-10-CM | POA: Diagnosis not present

## 2022-06-06 DIAGNOSIS — F0280 Dementia in other diseases classified elsewhere without behavioral disturbance: Secondary | ICD-10-CM | POA: Diagnosis not present

## 2022-06-06 DIAGNOSIS — C3492 Malignant neoplasm of unspecified part of left bronchus or lung: Secondary | ICD-10-CM | POA: Diagnosis not present

## 2022-06-11 DIAGNOSIS — D631 Anemia in chronic kidney disease: Secondary | ICD-10-CM | POA: Diagnosis not present

## 2022-06-11 DIAGNOSIS — C3492 Malignant neoplasm of unspecified part of left bronchus or lung: Secondary | ICD-10-CM | POA: Diagnosis not present

## 2022-06-11 DIAGNOSIS — K2289 Other specified disease of esophagus: Secondary | ICD-10-CM | POA: Diagnosis not present

## 2022-06-11 DIAGNOSIS — F0150 Vascular dementia without behavioral disturbance: Secondary | ICD-10-CM | POA: Diagnosis not present

## 2022-06-11 DIAGNOSIS — F0280 Dementia in other diseases classified elsewhere without behavioral disturbance: Secondary | ICD-10-CM | POA: Diagnosis not present

## 2022-06-11 DIAGNOSIS — G309 Alzheimer's disease, unspecified: Secondary | ICD-10-CM | POA: Diagnosis not present

## 2022-06-12 DIAGNOSIS — D631 Anemia in chronic kidney disease: Secondary | ICD-10-CM | POA: Diagnosis not present

## 2022-06-12 DIAGNOSIS — E785 Hyperlipidemia, unspecified: Secondary | ICD-10-CM | POA: Diagnosis not present

## 2022-06-12 DIAGNOSIS — C3492 Malignant neoplasm of unspecified part of left bronchus or lung: Secondary | ICD-10-CM | POA: Diagnosis not present

## 2022-06-12 DIAGNOSIS — K2289 Other specified disease of esophagus: Secondary | ICD-10-CM | POA: Diagnosis not present

## 2022-06-12 DIAGNOSIS — F0280 Dementia in other diseases classified elsewhere without behavioral disturbance: Secondary | ICD-10-CM | POA: Diagnosis not present

## 2022-06-12 DIAGNOSIS — R296 Repeated falls: Secondary | ICD-10-CM | POA: Diagnosis not present

## 2022-06-12 DIAGNOSIS — I252 Old myocardial infarction: Secondary | ICD-10-CM | POA: Diagnosis not present

## 2022-06-12 DIAGNOSIS — M1990 Unspecified osteoarthritis, unspecified site: Secondary | ICD-10-CM | POA: Diagnosis not present

## 2022-06-12 DIAGNOSIS — F0150 Vascular dementia without behavioral disturbance: Secondary | ICD-10-CM | POA: Diagnosis not present

## 2022-06-12 DIAGNOSIS — K449 Diaphragmatic hernia without obstruction or gangrene: Secondary | ICD-10-CM | POA: Diagnosis not present

## 2022-06-12 DIAGNOSIS — J8410 Pulmonary fibrosis, unspecified: Secondary | ICD-10-CM | POA: Diagnosis not present

## 2022-06-12 DIAGNOSIS — K219 Gastro-esophageal reflux disease without esophagitis: Secondary | ICD-10-CM | POA: Diagnosis not present

## 2022-06-12 DIAGNOSIS — E43 Unspecified severe protein-calorie malnutrition: Secondary | ICD-10-CM | POA: Diagnosis not present

## 2022-06-12 DIAGNOSIS — N1831 Chronic kidney disease, stage 3a: Secondary | ICD-10-CM | POA: Diagnosis not present

## 2022-06-12 DIAGNOSIS — I2510 Atherosclerotic heart disease of native coronary artery without angina pectoris: Secondary | ICD-10-CM | POA: Diagnosis not present

## 2022-06-12 DIAGNOSIS — G309 Alzheimer's disease, unspecified: Secondary | ICD-10-CM | POA: Diagnosis not present

## 2022-06-12 DIAGNOSIS — E1122 Type 2 diabetes mellitus with diabetic chronic kidney disease: Secondary | ICD-10-CM | POA: Diagnosis not present

## 2022-06-12 DIAGNOSIS — R634 Abnormal weight loss: Secondary | ICD-10-CM | POA: Diagnosis not present

## 2022-06-12 DIAGNOSIS — N400 Enlarged prostate without lower urinary tract symptoms: Secondary | ICD-10-CM | POA: Diagnosis not present

## 2022-06-13 DIAGNOSIS — F0150 Vascular dementia without behavioral disturbance: Secondary | ICD-10-CM | POA: Diagnosis not present

## 2022-06-13 DIAGNOSIS — F0280 Dementia in other diseases classified elsewhere without behavioral disturbance: Secondary | ICD-10-CM | POA: Diagnosis not present

## 2022-06-13 DIAGNOSIS — C3492 Malignant neoplasm of unspecified part of left bronchus or lung: Secondary | ICD-10-CM | POA: Diagnosis not present

## 2022-06-13 DIAGNOSIS — K2289 Other specified disease of esophagus: Secondary | ICD-10-CM | POA: Diagnosis not present

## 2022-06-13 DIAGNOSIS — D631 Anemia in chronic kidney disease: Secondary | ICD-10-CM | POA: Diagnosis not present

## 2022-06-13 DIAGNOSIS — G309 Alzheimer's disease, unspecified: Secondary | ICD-10-CM | POA: Diagnosis not present

## 2022-06-18 DIAGNOSIS — F0280 Dementia in other diseases classified elsewhere without behavioral disturbance: Secondary | ICD-10-CM | POA: Diagnosis not present

## 2022-06-18 DIAGNOSIS — G309 Alzheimer's disease, unspecified: Secondary | ICD-10-CM | POA: Diagnosis not present

## 2022-06-18 DIAGNOSIS — C3492 Malignant neoplasm of unspecified part of left bronchus or lung: Secondary | ICD-10-CM | POA: Diagnosis not present

## 2022-06-18 DIAGNOSIS — K2289 Other specified disease of esophagus: Secondary | ICD-10-CM | POA: Diagnosis not present

## 2022-06-18 DIAGNOSIS — F0150 Vascular dementia without behavioral disturbance: Secondary | ICD-10-CM | POA: Diagnosis not present

## 2022-06-18 DIAGNOSIS — D631 Anemia in chronic kidney disease: Secondary | ICD-10-CM | POA: Diagnosis not present

## 2022-06-19 DIAGNOSIS — F0150 Vascular dementia without behavioral disturbance: Secondary | ICD-10-CM | POA: Diagnosis not present

## 2022-06-19 DIAGNOSIS — D631 Anemia in chronic kidney disease: Secondary | ICD-10-CM | POA: Diagnosis not present

## 2022-06-19 DIAGNOSIS — G309 Alzheimer's disease, unspecified: Secondary | ICD-10-CM | POA: Diagnosis not present

## 2022-06-19 DIAGNOSIS — F0280 Dementia in other diseases classified elsewhere without behavioral disturbance: Secondary | ICD-10-CM | POA: Diagnosis not present

## 2022-06-19 DIAGNOSIS — K2289 Other specified disease of esophagus: Secondary | ICD-10-CM | POA: Diagnosis not present

## 2022-06-19 DIAGNOSIS — C3492 Malignant neoplasm of unspecified part of left bronchus or lung: Secondary | ICD-10-CM | POA: Diagnosis not present

## 2022-06-20 ENCOUNTER — Telehealth: Payer: Self-pay | Admitting: Cardiovascular Disease

## 2022-06-20 DIAGNOSIS — D631 Anemia in chronic kidney disease: Secondary | ICD-10-CM | POA: Diagnosis not present

## 2022-06-20 DIAGNOSIS — G309 Alzheimer's disease, unspecified: Secondary | ICD-10-CM | POA: Diagnosis not present

## 2022-06-20 DIAGNOSIS — C3492 Malignant neoplasm of unspecified part of left bronchus or lung: Secondary | ICD-10-CM | POA: Diagnosis not present

## 2022-06-20 DIAGNOSIS — F0150 Vascular dementia without behavioral disturbance: Secondary | ICD-10-CM | POA: Diagnosis not present

## 2022-06-20 DIAGNOSIS — F0280 Dementia in other diseases classified elsewhere without behavioral disturbance: Secondary | ICD-10-CM | POA: Diagnosis not present

## 2022-06-20 DIAGNOSIS — K2289 Other specified disease of esophagus: Secondary | ICD-10-CM | POA: Diagnosis not present

## 2022-06-20 NOTE — Telephone Encounter (Signed)
Wife declined recall patient in long term care

## 2022-06-25 DIAGNOSIS — F0280 Dementia in other diseases classified elsewhere without behavioral disturbance: Secondary | ICD-10-CM | POA: Diagnosis not present

## 2022-06-25 DIAGNOSIS — K2289 Other specified disease of esophagus: Secondary | ICD-10-CM | POA: Diagnosis not present

## 2022-06-25 DIAGNOSIS — C3492 Malignant neoplasm of unspecified part of left bronchus or lung: Secondary | ICD-10-CM | POA: Diagnosis not present

## 2022-06-25 DIAGNOSIS — G309 Alzheimer's disease, unspecified: Secondary | ICD-10-CM | POA: Diagnosis not present

## 2022-06-25 DIAGNOSIS — F0150 Vascular dementia without behavioral disturbance: Secondary | ICD-10-CM | POA: Diagnosis not present

## 2022-06-25 DIAGNOSIS — D631 Anemia in chronic kidney disease: Secondary | ICD-10-CM | POA: Diagnosis not present

## 2022-06-27 DIAGNOSIS — F0150 Vascular dementia without behavioral disturbance: Secondary | ICD-10-CM | POA: Diagnosis not present

## 2022-06-27 DIAGNOSIS — D631 Anemia in chronic kidney disease: Secondary | ICD-10-CM | POA: Diagnosis not present

## 2022-06-27 DIAGNOSIS — G309 Alzheimer's disease, unspecified: Secondary | ICD-10-CM | POA: Diagnosis not present

## 2022-06-27 DIAGNOSIS — C3492 Malignant neoplasm of unspecified part of left bronchus or lung: Secondary | ICD-10-CM | POA: Diagnosis not present

## 2022-06-27 DIAGNOSIS — K2289 Other specified disease of esophagus: Secondary | ICD-10-CM | POA: Diagnosis not present

## 2022-06-27 DIAGNOSIS — F0280 Dementia in other diseases classified elsewhere without behavioral disturbance: Secondary | ICD-10-CM | POA: Diagnosis not present

## 2022-07-02 DIAGNOSIS — E1121 Type 2 diabetes mellitus with diabetic nephropathy: Secondary | ICD-10-CM | POA: Diagnosis not present

## 2022-07-02 DIAGNOSIS — F0150 Vascular dementia without behavioral disturbance: Secondary | ICD-10-CM | POA: Diagnosis not present

## 2022-07-02 DIAGNOSIS — F39 Unspecified mood [affective] disorder: Secondary | ICD-10-CM | POA: Diagnosis not present

## 2022-07-02 DIAGNOSIS — C3490 Malignant neoplasm of unspecified part of unspecified bronchus or lung: Secondary | ICD-10-CM | POA: Diagnosis not present

## 2022-07-02 DIAGNOSIS — G309 Alzheimer's disease, unspecified: Secondary | ICD-10-CM | POA: Diagnosis not present

## 2022-07-02 DIAGNOSIS — C3492 Malignant neoplasm of unspecified part of left bronchus or lung: Secondary | ICD-10-CM | POA: Diagnosis not present

## 2022-07-02 DIAGNOSIS — G301 Alzheimer's disease with late onset: Secondary | ICD-10-CM | POA: Diagnosis not present

## 2022-07-02 DIAGNOSIS — F0280 Dementia in other diseases classified elsewhere without behavioral disturbance: Secondary | ICD-10-CM | POA: Diagnosis not present

## 2022-07-02 DIAGNOSIS — K2289 Other specified disease of esophagus: Secondary | ICD-10-CM | POA: Diagnosis not present

## 2022-07-02 DIAGNOSIS — D631 Anemia in chronic kidney disease: Secondary | ICD-10-CM | POA: Diagnosis not present

## 2022-07-02 DIAGNOSIS — K219 Gastro-esophageal reflux disease without esophagitis: Secondary | ICD-10-CM | POA: Diagnosis not present

## 2022-07-02 DIAGNOSIS — N400 Enlarged prostate without lower urinary tract symptoms: Secondary | ICD-10-CM | POA: Diagnosis not present

## 2022-07-02 DIAGNOSIS — N1831 Chronic kidney disease, stage 3a: Secondary | ICD-10-CM | POA: Diagnosis not present

## 2022-07-03 DIAGNOSIS — G309 Alzheimer's disease, unspecified: Secondary | ICD-10-CM | POA: Diagnosis not present

## 2022-07-03 DIAGNOSIS — C3492 Malignant neoplasm of unspecified part of left bronchus or lung: Secondary | ICD-10-CM | POA: Diagnosis not present

## 2022-07-03 DIAGNOSIS — F0280 Dementia in other diseases classified elsewhere without behavioral disturbance: Secondary | ICD-10-CM | POA: Diagnosis not present

## 2022-07-03 DIAGNOSIS — D631 Anemia in chronic kidney disease: Secondary | ICD-10-CM | POA: Diagnosis not present

## 2022-07-03 DIAGNOSIS — K2289 Other specified disease of esophagus: Secondary | ICD-10-CM | POA: Diagnosis not present

## 2022-07-03 DIAGNOSIS — F0150 Vascular dementia without behavioral disturbance: Secondary | ICD-10-CM | POA: Diagnosis not present

## 2022-07-04 DIAGNOSIS — D631 Anemia in chronic kidney disease: Secondary | ICD-10-CM | POA: Diagnosis not present

## 2022-07-04 DIAGNOSIS — F0280 Dementia in other diseases classified elsewhere without behavioral disturbance: Secondary | ICD-10-CM | POA: Diagnosis not present

## 2022-07-04 DIAGNOSIS — G309 Alzheimer's disease, unspecified: Secondary | ICD-10-CM | POA: Diagnosis not present

## 2022-07-04 DIAGNOSIS — F0150 Vascular dementia without behavioral disturbance: Secondary | ICD-10-CM | POA: Diagnosis not present

## 2022-07-04 DIAGNOSIS — C3492 Malignant neoplasm of unspecified part of left bronchus or lung: Secondary | ICD-10-CM | POA: Diagnosis not present

## 2022-07-04 DIAGNOSIS — K2289 Other specified disease of esophagus: Secondary | ICD-10-CM | POA: Diagnosis not present

## 2022-07-05 ENCOUNTER — Telehealth: Payer: Self-pay

## 2022-07-05 DIAGNOSIS — D631 Anemia in chronic kidney disease: Secondary | ICD-10-CM | POA: Diagnosis not present

## 2022-07-05 DIAGNOSIS — F0150 Vascular dementia without behavioral disturbance: Secondary | ICD-10-CM | POA: Diagnosis not present

## 2022-07-05 DIAGNOSIS — K2289 Other specified disease of esophagus: Secondary | ICD-10-CM | POA: Diagnosis not present

## 2022-07-05 DIAGNOSIS — F0280 Dementia in other diseases classified elsewhere without behavioral disturbance: Secondary | ICD-10-CM | POA: Diagnosis not present

## 2022-07-05 DIAGNOSIS — C3492 Malignant neoplasm of unspecified part of left bronchus or lung: Secondary | ICD-10-CM | POA: Diagnosis not present

## 2022-07-05 DIAGNOSIS — G309 Alzheimer's disease, unspecified: Secondary | ICD-10-CM | POA: Diagnosis not present

## 2022-07-05 NOTE — Telephone Encounter (Signed)
This request should go to [employer]. I believe Anda Kraft and I discussed this yesterday.

## 2022-07-05 NOTE — Telephone Encounter (Signed)
Prior auth started for Pantoprazole Sodium 40MG  dr tablets. [place]: [id number]   This was last prescribed by Dr Nicole Kindred on 01/23/22.   Per CMM:  [facility] has indicated that it is too soon to refill this medication at the pharmacy for your patient. If you need to renew an existing PA for your patient's medication, please reach out to [place] directly at [phone].

## 2022-07-09 DIAGNOSIS — C3492 Malignant neoplasm of unspecified part of left bronchus or lung: Secondary | ICD-10-CM | POA: Diagnosis not present

## 2022-07-09 DIAGNOSIS — F0280 Dementia in other diseases classified elsewhere without behavioral disturbance: Secondary | ICD-10-CM | POA: Diagnosis not present

## 2022-07-09 DIAGNOSIS — G309 Alzheimer's disease, unspecified: Secondary | ICD-10-CM | POA: Diagnosis not present

## 2022-07-09 DIAGNOSIS — K2289 Other specified disease of esophagus: Secondary | ICD-10-CM | POA: Diagnosis not present

## 2022-07-09 DIAGNOSIS — D631 Anemia in chronic kidney disease: Secondary | ICD-10-CM | POA: Diagnosis not present

## 2022-07-09 DIAGNOSIS — F0150 Vascular dementia without behavioral disturbance: Secondary | ICD-10-CM | POA: Diagnosis not present

## 2022-07-11 DIAGNOSIS — F0280 Dementia in other diseases classified elsewhere without behavioral disturbance: Secondary | ICD-10-CM | POA: Diagnosis not present

## 2022-07-11 DIAGNOSIS — C3492 Malignant neoplasm of unspecified part of left bronchus or lung: Secondary | ICD-10-CM | POA: Diagnosis not present

## 2022-07-11 DIAGNOSIS — K2289 Other specified disease of esophagus: Secondary | ICD-10-CM | POA: Diagnosis not present

## 2022-07-11 DIAGNOSIS — F0150 Vascular dementia without behavioral disturbance: Secondary | ICD-10-CM | POA: Diagnosis not present

## 2022-07-11 DIAGNOSIS — D631 Anemia in chronic kidney disease: Secondary | ICD-10-CM | POA: Diagnosis not present

## 2022-07-11 DIAGNOSIS — G309 Alzheimer's disease, unspecified: Secondary | ICD-10-CM | POA: Diagnosis not present

## 2022-07-13 DIAGNOSIS — N1831 Chronic kidney disease, stage 3a: Secondary | ICD-10-CM | POA: Diagnosis not present

## 2022-07-13 DIAGNOSIS — G309 Alzheimer's disease, unspecified: Secondary | ICD-10-CM | POA: Diagnosis not present

## 2022-07-13 DIAGNOSIS — R634 Abnormal weight loss: Secondary | ICD-10-CM | POA: Diagnosis not present

## 2022-07-13 DIAGNOSIS — D631 Anemia in chronic kidney disease: Secondary | ICD-10-CM | POA: Diagnosis not present

## 2022-07-13 DIAGNOSIS — N400 Enlarged prostate without lower urinary tract symptoms: Secondary | ICD-10-CM | POA: Diagnosis not present

## 2022-07-13 DIAGNOSIS — F0150 Vascular dementia without behavioral disturbance: Secondary | ICD-10-CM | POA: Diagnosis not present

## 2022-07-13 DIAGNOSIS — E785 Hyperlipidemia, unspecified: Secondary | ICD-10-CM | POA: Diagnosis not present

## 2022-07-13 DIAGNOSIS — K449 Diaphragmatic hernia without obstruction or gangrene: Secondary | ICD-10-CM | POA: Diagnosis not present

## 2022-07-13 DIAGNOSIS — K219 Gastro-esophageal reflux disease without esophagitis: Secondary | ICD-10-CM | POA: Diagnosis not present

## 2022-07-13 DIAGNOSIS — R296 Repeated falls: Secondary | ICD-10-CM | POA: Diagnosis not present

## 2022-07-13 DIAGNOSIS — F0280 Dementia in other diseases classified elsewhere without behavioral disturbance: Secondary | ICD-10-CM | POA: Diagnosis not present

## 2022-07-13 DIAGNOSIS — M1990 Unspecified osteoarthritis, unspecified site: Secondary | ICD-10-CM | POA: Diagnosis not present

## 2022-07-13 DIAGNOSIS — E1122 Type 2 diabetes mellitus with diabetic chronic kidney disease: Secondary | ICD-10-CM | POA: Diagnosis not present

## 2022-07-13 DIAGNOSIS — K2289 Other specified disease of esophagus: Secondary | ICD-10-CM | POA: Diagnosis not present

## 2022-07-13 DIAGNOSIS — J8410 Pulmonary fibrosis, unspecified: Secondary | ICD-10-CM | POA: Diagnosis not present

## 2022-07-13 DIAGNOSIS — I252 Old myocardial infarction: Secondary | ICD-10-CM | POA: Diagnosis not present

## 2022-07-13 DIAGNOSIS — E43 Unspecified severe protein-calorie malnutrition: Secondary | ICD-10-CM | POA: Diagnosis not present

## 2022-07-13 DIAGNOSIS — C3492 Malignant neoplasm of unspecified part of left bronchus or lung: Secondary | ICD-10-CM | POA: Diagnosis not present

## 2022-07-13 DIAGNOSIS — I2510 Atherosclerotic heart disease of native coronary artery without angina pectoris: Secondary | ICD-10-CM | POA: Diagnosis not present

## 2022-07-16 DIAGNOSIS — G309 Alzheimer's disease, unspecified: Secondary | ICD-10-CM | POA: Diagnosis not present

## 2022-07-16 DIAGNOSIS — F0280 Dementia in other diseases classified elsewhere without behavioral disturbance: Secondary | ICD-10-CM | POA: Diagnosis not present

## 2022-07-16 DIAGNOSIS — D631 Anemia in chronic kidney disease: Secondary | ICD-10-CM | POA: Diagnosis not present

## 2022-07-16 DIAGNOSIS — K2289 Other specified disease of esophagus: Secondary | ICD-10-CM | POA: Diagnosis not present

## 2022-07-16 DIAGNOSIS — F0150 Vascular dementia without behavioral disturbance: Secondary | ICD-10-CM | POA: Diagnosis not present

## 2022-07-16 DIAGNOSIS — C3492 Malignant neoplasm of unspecified part of left bronchus or lung: Secondary | ICD-10-CM | POA: Diagnosis not present

## 2022-07-18 DIAGNOSIS — C3492 Malignant neoplasm of unspecified part of left bronchus or lung: Secondary | ICD-10-CM | POA: Diagnosis not present

## 2022-07-18 DIAGNOSIS — F0280 Dementia in other diseases classified elsewhere without behavioral disturbance: Secondary | ICD-10-CM | POA: Diagnosis not present

## 2022-07-18 DIAGNOSIS — K2289 Other specified disease of esophagus: Secondary | ICD-10-CM | POA: Diagnosis not present

## 2022-07-18 DIAGNOSIS — F0150 Vascular dementia without behavioral disturbance: Secondary | ICD-10-CM | POA: Diagnosis not present

## 2022-07-18 DIAGNOSIS — D631 Anemia in chronic kidney disease: Secondary | ICD-10-CM | POA: Diagnosis not present

## 2022-07-18 DIAGNOSIS — G309 Alzheimer's disease, unspecified: Secondary | ICD-10-CM | POA: Diagnosis not present

## 2022-07-19 DIAGNOSIS — F0280 Dementia in other diseases classified elsewhere without behavioral disturbance: Secondary | ICD-10-CM | POA: Diagnosis not present

## 2022-07-19 DIAGNOSIS — F0150 Vascular dementia without behavioral disturbance: Secondary | ICD-10-CM | POA: Diagnosis not present

## 2022-07-19 DIAGNOSIS — G309 Alzheimer's disease, unspecified: Secondary | ICD-10-CM | POA: Diagnosis not present

## 2022-07-19 DIAGNOSIS — K2289 Other specified disease of esophagus: Secondary | ICD-10-CM | POA: Diagnosis not present

## 2022-07-19 DIAGNOSIS — C3492 Malignant neoplasm of unspecified part of left bronchus or lung: Secondary | ICD-10-CM | POA: Diagnosis not present

## 2022-07-19 DIAGNOSIS — D631 Anemia in chronic kidney disease: Secondary | ICD-10-CM | POA: Diagnosis not present

## 2022-07-23 DIAGNOSIS — F0150 Vascular dementia without behavioral disturbance: Secondary | ICD-10-CM | POA: Diagnosis not present

## 2022-07-23 DIAGNOSIS — C3492 Malignant neoplasm of unspecified part of left bronchus or lung: Secondary | ICD-10-CM | POA: Diagnosis not present

## 2022-07-23 DIAGNOSIS — G309 Alzheimer's disease, unspecified: Secondary | ICD-10-CM | POA: Diagnosis not present

## 2022-07-23 DIAGNOSIS — K2289 Other specified disease of esophagus: Secondary | ICD-10-CM | POA: Diagnosis not present

## 2022-07-23 DIAGNOSIS — F0280 Dementia in other diseases classified elsewhere without behavioral disturbance: Secondary | ICD-10-CM | POA: Diagnosis not present

## 2022-07-23 DIAGNOSIS — D631 Anemia in chronic kidney disease: Secondary | ICD-10-CM | POA: Diagnosis not present

## 2022-07-25 DIAGNOSIS — F0150 Vascular dementia without behavioral disturbance: Secondary | ICD-10-CM | POA: Diagnosis not present

## 2022-07-25 DIAGNOSIS — C3492 Malignant neoplasm of unspecified part of left bronchus or lung: Secondary | ICD-10-CM | POA: Diagnosis not present

## 2022-07-25 DIAGNOSIS — D631 Anemia in chronic kidney disease: Secondary | ICD-10-CM | POA: Diagnosis not present

## 2022-07-25 DIAGNOSIS — G309 Alzheimer's disease, unspecified: Secondary | ICD-10-CM | POA: Diagnosis not present

## 2022-07-25 DIAGNOSIS — F0280 Dementia in other diseases classified elsewhere without behavioral disturbance: Secondary | ICD-10-CM | POA: Diagnosis not present

## 2022-07-25 DIAGNOSIS — K2289 Other specified disease of esophagus: Secondary | ICD-10-CM | POA: Diagnosis not present

## 2022-07-26 DIAGNOSIS — C3492 Malignant neoplasm of unspecified part of left bronchus or lung: Secondary | ICD-10-CM | POA: Diagnosis not present

## 2022-07-26 DIAGNOSIS — F0150 Vascular dementia without behavioral disturbance: Secondary | ICD-10-CM | POA: Diagnosis not present

## 2022-07-26 DIAGNOSIS — F0280 Dementia in other diseases classified elsewhere without behavioral disturbance: Secondary | ICD-10-CM | POA: Diagnosis not present

## 2022-07-26 DIAGNOSIS — G309 Alzheimer's disease, unspecified: Secondary | ICD-10-CM | POA: Diagnosis not present

## 2022-07-26 DIAGNOSIS — K2289 Other specified disease of esophagus: Secondary | ICD-10-CM | POA: Diagnosis not present

## 2022-07-26 DIAGNOSIS — D631 Anemia in chronic kidney disease: Secondary | ICD-10-CM | POA: Diagnosis not present

## 2022-07-27 ENCOUNTER — Other Ambulatory Visit: Payer: Self-pay | Admitting: Student

## 2022-07-27 DIAGNOSIS — G4709 Other insomnia: Secondary | ICD-10-CM

## 2022-07-27 MED ORDER — CLONAZEPAM 1 MG PO TABS: 1.0000 mg | ORAL_TABLET | Freq: Every day | ORAL | 0 refills | Status: DC

## 2022-07-27 NOTE — Addendum Note (Signed)
Addended by: Dewayne Shorter on: 07/27/2022 08:36 PM   Modules accepted: Orders

## 2022-07-27 NOTE — Progress Notes (Signed)
Patient needs refill of clonazepam 1mg  at bedtime for insomnia.  Tomasa Rand, MD, [facility] [phone]

## 2022-07-30 DIAGNOSIS — K2289 Other specified disease of esophagus: Secondary | ICD-10-CM | POA: Diagnosis not present

## 2022-07-30 DIAGNOSIS — D631 Anemia in chronic kidney disease: Secondary | ICD-10-CM | POA: Diagnosis not present

## 2022-07-30 DIAGNOSIS — F0150 Vascular dementia without behavioral disturbance: Secondary | ICD-10-CM | POA: Diagnosis not present

## 2022-07-30 DIAGNOSIS — G309 Alzheimer's disease, unspecified: Secondary | ICD-10-CM | POA: Diagnosis not present

## 2022-07-30 DIAGNOSIS — C3492 Malignant neoplasm of unspecified part of left bronchus or lung: Secondary | ICD-10-CM | POA: Diagnosis not present

## 2022-07-30 DIAGNOSIS — F0280 Dementia in other diseases classified elsewhere without behavioral disturbance: Secondary | ICD-10-CM | POA: Diagnosis not present

## 2022-08-01 DIAGNOSIS — C3492 Malignant neoplasm of unspecified part of left bronchus or lung: Secondary | ICD-10-CM | POA: Diagnosis not present

## 2022-08-01 DIAGNOSIS — F0280 Dementia in other diseases classified elsewhere without behavioral disturbance: Secondary | ICD-10-CM | POA: Diagnosis not present

## 2022-08-01 DIAGNOSIS — F0150 Vascular dementia without behavioral disturbance: Secondary | ICD-10-CM | POA: Diagnosis not present

## 2022-08-01 DIAGNOSIS — D631 Anemia in chronic kidney disease: Secondary | ICD-10-CM | POA: Diagnosis not present

## 2022-08-01 DIAGNOSIS — G309 Alzheimer's disease, unspecified: Secondary | ICD-10-CM | POA: Diagnosis not present

## 2022-08-01 DIAGNOSIS — K2289 Other specified disease of esophagus: Secondary | ICD-10-CM | POA: Diagnosis not present

## 2022-08-02 DIAGNOSIS — C3492 Malignant neoplasm of unspecified part of left bronchus or lung: Secondary | ICD-10-CM | POA: Diagnosis not present

## 2022-08-02 DIAGNOSIS — F0150 Vascular dementia without behavioral disturbance: Secondary | ICD-10-CM | POA: Diagnosis not present

## 2022-08-02 DIAGNOSIS — G309 Alzheimer's disease, unspecified: Secondary | ICD-10-CM | POA: Diagnosis not present

## 2022-08-02 DIAGNOSIS — K2289 Other specified disease of esophagus: Secondary | ICD-10-CM | POA: Diagnosis not present

## 2022-08-02 DIAGNOSIS — D631 Anemia in chronic kidney disease: Secondary | ICD-10-CM | POA: Diagnosis not present

## 2022-08-02 DIAGNOSIS — F0280 Dementia in other diseases classified elsewhere without behavioral disturbance: Secondary | ICD-10-CM | POA: Diagnosis not present

## 2022-08-06 DIAGNOSIS — F0280 Dementia in other diseases classified elsewhere without behavioral disturbance: Secondary | ICD-10-CM | POA: Diagnosis not present

## 2022-08-06 DIAGNOSIS — C3492 Malignant neoplasm of unspecified part of left bronchus or lung: Secondary | ICD-10-CM | POA: Diagnosis not present

## 2022-08-06 DIAGNOSIS — K2289 Other specified disease of esophagus: Secondary | ICD-10-CM | POA: Diagnosis not present

## 2022-08-06 DIAGNOSIS — G309 Alzheimer's disease, unspecified: Secondary | ICD-10-CM | POA: Diagnosis not present

## 2022-08-06 DIAGNOSIS — D631 Anemia in chronic kidney disease: Secondary | ICD-10-CM | POA: Diagnosis not present

## 2022-08-06 DIAGNOSIS — F0150 Vascular dementia without behavioral disturbance: Secondary | ICD-10-CM | POA: Diagnosis not present

## 2022-08-08 DIAGNOSIS — K2289 Other specified disease of esophagus: Secondary | ICD-10-CM | POA: Diagnosis not present

## 2022-08-08 DIAGNOSIS — F0150 Vascular dementia without behavioral disturbance: Secondary | ICD-10-CM | POA: Diagnosis not present

## 2022-08-08 DIAGNOSIS — D631 Anemia in chronic kidney disease: Secondary | ICD-10-CM | POA: Diagnosis not present

## 2022-08-08 DIAGNOSIS — G309 Alzheimer's disease, unspecified: Secondary | ICD-10-CM | POA: Diagnosis not present

## 2022-08-08 DIAGNOSIS — F0280 Dementia in other diseases classified elsewhere without behavioral disturbance: Secondary | ICD-10-CM | POA: Diagnosis not present

## 2022-08-08 DIAGNOSIS — C3492 Malignant neoplasm of unspecified part of left bronchus or lung: Secondary | ICD-10-CM | POA: Diagnosis not present

## 2022-08-09 DIAGNOSIS — K2289 Other specified disease of esophagus: Secondary | ICD-10-CM | POA: Diagnosis not present

## 2022-08-09 DIAGNOSIS — C3492 Malignant neoplasm of unspecified part of left bronchus or lung: Secondary | ICD-10-CM | POA: Diagnosis not present

## 2022-08-09 DIAGNOSIS — G309 Alzheimer's disease, unspecified: Secondary | ICD-10-CM | POA: Diagnosis not present

## 2022-08-09 DIAGNOSIS — F0280 Dementia in other diseases classified elsewhere without behavioral disturbance: Secondary | ICD-10-CM | POA: Diagnosis not present

## 2022-08-09 DIAGNOSIS — D631 Anemia in chronic kidney disease: Secondary | ICD-10-CM | POA: Diagnosis not present

## 2022-08-09 DIAGNOSIS — F0150 Vascular dementia without behavioral disturbance: Secondary | ICD-10-CM | POA: Diagnosis not present

## 2022-08-12 DIAGNOSIS — E785 Hyperlipidemia, unspecified: Secondary | ICD-10-CM | POA: Diagnosis not present

## 2022-08-12 DIAGNOSIS — I2510 Atherosclerotic heart disease of native coronary artery without angina pectoris: Secondary | ICD-10-CM | POA: Diagnosis not present

## 2022-08-12 DIAGNOSIS — K2289 Other specified disease of esophagus: Secondary | ICD-10-CM | POA: Diagnosis not present

## 2022-08-12 DIAGNOSIS — M1990 Unspecified osteoarthritis, unspecified site: Secondary | ICD-10-CM | POA: Diagnosis not present

## 2022-08-12 DIAGNOSIS — R634 Abnormal weight loss: Secondary | ICD-10-CM | POA: Diagnosis not present

## 2022-08-12 DIAGNOSIS — K449 Diaphragmatic hernia without obstruction or gangrene: Secondary | ICD-10-CM | POA: Diagnosis not present

## 2022-08-12 DIAGNOSIS — F05 Delirium due to known physiological condition: Secondary | ICD-10-CM | POA: Diagnosis not present

## 2022-08-12 DIAGNOSIS — N1831 Chronic kidney disease, stage 3a: Secondary | ICD-10-CM | POA: Diagnosis not present

## 2022-08-12 DIAGNOSIS — R296 Repeated falls: Secondary | ICD-10-CM | POA: Diagnosis not present

## 2022-08-12 DIAGNOSIS — D631 Anemia in chronic kidney disease: Secondary | ICD-10-CM | POA: Diagnosis not present

## 2022-08-12 DIAGNOSIS — K219 Gastro-esophageal reflux disease without esophagitis: Secondary | ICD-10-CM | POA: Diagnosis not present

## 2022-08-12 DIAGNOSIS — E1122 Type 2 diabetes mellitus with diabetic chronic kidney disease: Secondary | ICD-10-CM | POA: Diagnosis not present

## 2022-08-12 DIAGNOSIS — F0154 Vascular dementia, unspecified severity, with anxiety: Secondary | ICD-10-CM | POA: Diagnosis not present

## 2022-08-12 DIAGNOSIS — F0284 Dementia in other diseases classified elsewhere, unspecified severity, with anxiety: Secondary | ICD-10-CM | POA: Diagnosis not present

## 2022-08-12 DIAGNOSIS — E8809 Other disorders of plasma-protein metabolism, not elsewhere classified: Secondary | ICD-10-CM | POA: Diagnosis not present

## 2022-08-12 DIAGNOSIS — N400 Enlarged prostate without lower urinary tract symptoms: Secondary | ICD-10-CM | POA: Diagnosis not present

## 2022-08-12 DIAGNOSIS — I252 Old myocardial infarction: Secondary | ICD-10-CM | POA: Diagnosis not present

## 2022-08-12 DIAGNOSIS — E43 Unspecified severe protein-calorie malnutrition: Secondary | ICD-10-CM | POA: Diagnosis not present

## 2022-08-12 DIAGNOSIS — R911 Solitary pulmonary nodule: Secondary | ICD-10-CM | POA: Diagnosis not present

## 2022-08-12 DIAGNOSIS — I129 Hypertensive chronic kidney disease with stage 1 through stage 4 chronic kidney disease, or unspecified chronic kidney disease: Secondary | ICD-10-CM | POA: Diagnosis not present

## 2022-08-12 DIAGNOSIS — J8410 Pulmonary fibrosis, unspecified: Secondary | ICD-10-CM | POA: Diagnosis not present

## 2022-08-12 DIAGNOSIS — G309 Alzheimer's disease, unspecified: Secondary | ICD-10-CM | POA: Diagnosis not present

## 2022-08-12 DIAGNOSIS — Z6828 Body mass index (BMI) 28.0-28.9, adult: Secondary | ICD-10-CM | POA: Diagnosis not present

## 2022-08-13 DIAGNOSIS — D631 Anemia in chronic kidney disease: Secondary | ICD-10-CM | POA: Diagnosis not present

## 2022-08-13 DIAGNOSIS — G309 Alzheimer's disease, unspecified: Secondary | ICD-10-CM | POA: Diagnosis not present

## 2022-08-13 DIAGNOSIS — F0154 Vascular dementia, unspecified severity, with anxiety: Secondary | ICD-10-CM | POA: Diagnosis not present

## 2022-08-13 DIAGNOSIS — F0284 Dementia in other diseases classified elsewhere, unspecified severity, with anxiety: Secondary | ICD-10-CM | POA: Diagnosis not present

## 2022-08-13 DIAGNOSIS — K2289 Other specified disease of esophagus: Secondary | ICD-10-CM | POA: Diagnosis not present

## 2022-08-13 DIAGNOSIS — E43 Unspecified severe protein-calorie malnutrition: Secondary | ICD-10-CM | POA: Diagnosis not present

## 2022-08-15 DIAGNOSIS — F0154 Vascular dementia, unspecified severity, with anxiety: Secondary | ICD-10-CM | POA: Diagnosis not present

## 2022-08-15 DIAGNOSIS — G309 Alzheimer's disease, unspecified: Secondary | ICD-10-CM | POA: Diagnosis not present

## 2022-08-15 DIAGNOSIS — E43 Unspecified severe protein-calorie malnutrition: Secondary | ICD-10-CM | POA: Diagnosis not present

## 2022-08-15 DIAGNOSIS — F0284 Dementia in other diseases classified elsewhere, unspecified severity, with anxiety: Secondary | ICD-10-CM | POA: Diagnosis not present

## 2022-08-15 DIAGNOSIS — D631 Anemia in chronic kidney disease: Secondary | ICD-10-CM | POA: Diagnosis not present

## 2022-08-15 DIAGNOSIS — K2289 Other specified disease of esophagus: Secondary | ICD-10-CM | POA: Diagnosis not present

## 2022-08-16 DIAGNOSIS — K2289 Other specified disease of esophagus: Secondary | ICD-10-CM | POA: Diagnosis not present

## 2022-08-16 DIAGNOSIS — E43 Unspecified severe protein-calorie malnutrition: Secondary | ICD-10-CM | POA: Diagnosis not present

## 2022-08-16 DIAGNOSIS — F0284 Dementia in other diseases classified elsewhere, unspecified severity, with anxiety: Secondary | ICD-10-CM | POA: Diagnosis not present

## 2022-08-16 DIAGNOSIS — F0154 Vascular dementia, unspecified severity, with anxiety: Secondary | ICD-10-CM | POA: Diagnosis not present

## 2022-08-16 DIAGNOSIS — D631 Anemia in chronic kidney disease: Secondary | ICD-10-CM | POA: Diagnosis not present

## 2022-08-16 DIAGNOSIS — G309 Alzheimer's disease, unspecified: Secondary | ICD-10-CM | POA: Diagnosis not present

## 2022-08-20 DIAGNOSIS — F0154 Vascular dementia, unspecified severity, with anxiety: Secondary | ICD-10-CM | POA: Diagnosis not present

## 2022-08-20 DIAGNOSIS — F0284 Dementia in other diseases classified elsewhere, unspecified severity, with anxiety: Secondary | ICD-10-CM | POA: Diagnosis not present

## 2022-08-20 DIAGNOSIS — K2289 Other specified disease of esophagus: Secondary | ICD-10-CM | POA: Diagnosis not present

## 2022-08-20 DIAGNOSIS — E43 Unspecified severe protein-calorie malnutrition: Secondary | ICD-10-CM | POA: Diagnosis not present

## 2022-08-20 DIAGNOSIS — D631 Anemia in chronic kidney disease: Secondary | ICD-10-CM | POA: Diagnosis not present

## 2022-08-20 DIAGNOSIS — G309 Alzheimer's disease, unspecified: Secondary | ICD-10-CM | POA: Diagnosis not present

## 2022-08-22 DIAGNOSIS — E43 Unspecified severe protein-calorie malnutrition: Secondary | ICD-10-CM | POA: Diagnosis not present

## 2022-08-22 DIAGNOSIS — G309 Alzheimer's disease, unspecified: Secondary | ICD-10-CM | POA: Diagnosis not present

## 2022-08-22 DIAGNOSIS — K2289 Other specified disease of esophagus: Secondary | ICD-10-CM | POA: Diagnosis not present

## 2022-08-22 DIAGNOSIS — F0284 Dementia in other diseases classified elsewhere, unspecified severity, with anxiety: Secondary | ICD-10-CM | POA: Diagnosis not present

## 2022-08-22 DIAGNOSIS — D631 Anemia in chronic kidney disease: Secondary | ICD-10-CM | POA: Diagnosis not present

## 2022-08-22 DIAGNOSIS — F0154 Vascular dementia, unspecified severity, with anxiety: Secondary | ICD-10-CM | POA: Diagnosis not present

## 2022-08-23 DIAGNOSIS — F0154 Vascular dementia, unspecified severity, with anxiety: Secondary | ICD-10-CM | POA: Diagnosis not present

## 2022-08-23 DIAGNOSIS — D631 Anemia in chronic kidney disease: Secondary | ICD-10-CM | POA: Diagnosis not present

## 2022-08-23 DIAGNOSIS — E43 Unspecified severe protein-calorie malnutrition: Secondary | ICD-10-CM | POA: Diagnosis not present

## 2022-08-23 DIAGNOSIS — G309 Alzheimer's disease, unspecified: Secondary | ICD-10-CM | POA: Diagnosis not present

## 2022-08-23 DIAGNOSIS — K2289 Other specified disease of esophagus: Secondary | ICD-10-CM | POA: Diagnosis not present

## 2022-08-23 DIAGNOSIS — F0284 Dementia in other diseases classified elsewhere, unspecified severity, with anxiety: Secondary | ICD-10-CM | POA: Diagnosis not present

## 2022-08-27 DIAGNOSIS — G309 Alzheimer's disease, unspecified: Secondary | ICD-10-CM | POA: Diagnosis not present

## 2022-08-27 DIAGNOSIS — D631 Anemia in chronic kidney disease: Secondary | ICD-10-CM | POA: Diagnosis not present

## 2022-08-27 DIAGNOSIS — E43 Unspecified severe protein-calorie malnutrition: Secondary | ICD-10-CM | POA: Diagnosis not present

## 2022-08-27 DIAGNOSIS — F0154 Vascular dementia, unspecified severity, with anxiety: Secondary | ICD-10-CM | POA: Diagnosis not present

## 2022-08-27 DIAGNOSIS — K2289 Other specified disease of esophagus: Secondary | ICD-10-CM | POA: Diagnosis not present

## 2022-08-27 DIAGNOSIS — F0284 Dementia in other diseases classified elsewhere, unspecified severity, with anxiety: Secondary | ICD-10-CM | POA: Diagnosis not present

## 2022-08-29 DIAGNOSIS — F0284 Dementia in other diseases classified elsewhere, unspecified severity, with anxiety: Secondary | ICD-10-CM | POA: Diagnosis not present

## 2022-08-29 DIAGNOSIS — G309 Alzheimer's disease, unspecified: Secondary | ICD-10-CM | POA: Diagnosis not present

## 2022-08-29 DIAGNOSIS — D631 Anemia in chronic kidney disease: Secondary | ICD-10-CM | POA: Diagnosis not present

## 2022-08-29 DIAGNOSIS — F0154 Vascular dementia, unspecified severity, with anxiety: Secondary | ICD-10-CM | POA: Diagnosis not present

## 2022-08-29 DIAGNOSIS — K2289 Other specified disease of esophagus: Secondary | ICD-10-CM | POA: Diagnosis not present

## 2022-08-29 DIAGNOSIS — E43 Unspecified severe protein-calorie malnutrition: Secondary | ICD-10-CM | POA: Diagnosis not present

## 2022-08-30 DIAGNOSIS — G309 Alzheimer's disease, unspecified: Secondary | ICD-10-CM | POA: Diagnosis not present

## 2022-08-30 DIAGNOSIS — F0284 Dementia in other diseases classified elsewhere, unspecified severity, with anxiety: Secondary | ICD-10-CM | POA: Diagnosis not present

## 2022-08-30 DIAGNOSIS — F0154 Vascular dementia, unspecified severity, with anxiety: Secondary | ICD-10-CM | POA: Diagnosis not present

## 2022-08-30 DIAGNOSIS — K2289 Other specified disease of esophagus: Secondary | ICD-10-CM | POA: Diagnosis not present

## 2022-08-30 DIAGNOSIS — D631 Anemia in chronic kidney disease: Secondary | ICD-10-CM | POA: Diagnosis not present

## 2022-08-30 DIAGNOSIS — E43 Unspecified severe protein-calorie malnutrition: Secondary | ICD-10-CM | POA: Diagnosis not present

## 2022-09-03 DIAGNOSIS — F0284 Dementia in other diseases classified elsewhere, unspecified severity, with anxiety: Secondary | ICD-10-CM | POA: Diagnosis not present

## 2022-09-03 DIAGNOSIS — F0154 Vascular dementia, unspecified severity, with anxiety: Secondary | ICD-10-CM | POA: Diagnosis not present

## 2022-09-03 DIAGNOSIS — D631 Anemia in chronic kidney disease: Secondary | ICD-10-CM | POA: Diagnosis not present

## 2022-09-03 DIAGNOSIS — K2289 Other specified disease of esophagus: Secondary | ICD-10-CM | POA: Diagnosis not present

## 2022-09-03 DIAGNOSIS — G309 Alzheimer's disease, unspecified: Secondary | ICD-10-CM | POA: Diagnosis not present

## 2022-09-03 DIAGNOSIS — E43 Unspecified severe protein-calorie malnutrition: Secondary | ICD-10-CM | POA: Diagnosis not present

## 2022-09-05 DIAGNOSIS — D631 Anemia in chronic kidney disease: Secondary | ICD-10-CM | POA: Diagnosis not present

## 2022-09-05 DIAGNOSIS — E43 Unspecified severe protein-calorie malnutrition: Secondary | ICD-10-CM | POA: Diagnosis not present

## 2022-09-05 DIAGNOSIS — K2289 Other specified disease of esophagus: Secondary | ICD-10-CM | POA: Diagnosis not present

## 2022-09-05 DIAGNOSIS — G309 Alzheimer's disease, unspecified: Secondary | ICD-10-CM | POA: Diagnosis not present

## 2022-09-05 DIAGNOSIS — F0284 Dementia in other diseases classified elsewhere, unspecified severity, with anxiety: Secondary | ICD-10-CM | POA: Diagnosis not present

## 2022-09-05 DIAGNOSIS — F0154 Vascular dementia, unspecified severity, with anxiety: Secondary | ICD-10-CM | POA: Diagnosis not present

## 2022-09-06 DIAGNOSIS — G309 Alzheimer's disease, unspecified: Secondary | ICD-10-CM | POA: Diagnosis not present

## 2022-09-06 DIAGNOSIS — D631 Anemia in chronic kidney disease: Secondary | ICD-10-CM | POA: Diagnosis not present

## 2022-09-06 DIAGNOSIS — E43 Unspecified severe protein-calorie malnutrition: Secondary | ICD-10-CM | POA: Diagnosis not present

## 2022-09-06 DIAGNOSIS — F0154 Vascular dementia, unspecified severity, with anxiety: Secondary | ICD-10-CM | POA: Diagnosis not present

## 2022-09-06 DIAGNOSIS — K2289 Other specified disease of esophagus: Secondary | ICD-10-CM | POA: Diagnosis not present

## 2022-09-06 DIAGNOSIS — F0284 Dementia in other diseases classified elsewhere, unspecified severity, with anxiety: Secondary | ICD-10-CM | POA: Diagnosis not present

## 2022-09-10 ENCOUNTER — Encounter (INDEPENDENT_AMBULATORY_CARE_PROVIDER_SITE_OTHER): Payer: Self-pay

## 2022-09-10 DIAGNOSIS — K2289 Other specified disease of esophagus: Secondary | ICD-10-CM | POA: Diagnosis not present

## 2022-09-10 DIAGNOSIS — D631 Anemia in chronic kidney disease: Secondary | ICD-10-CM | POA: Diagnosis not present

## 2022-09-10 DIAGNOSIS — G309 Alzheimer's disease, unspecified: Secondary | ICD-10-CM | POA: Diagnosis not present

## 2022-09-10 DIAGNOSIS — F0154 Vascular dementia, unspecified severity, with anxiety: Secondary | ICD-10-CM | POA: Diagnosis not present

## 2022-09-10 DIAGNOSIS — F0284 Dementia in other diseases classified elsewhere, unspecified severity, with anxiety: Secondary | ICD-10-CM | POA: Diagnosis not present

## 2022-09-10 DIAGNOSIS — E43 Unspecified severe protein-calorie malnutrition: Secondary | ICD-10-CM | POA: Diagnosis not present

## 2022-09-11 ENCOUNTER — Encounter: Payer: Self-pay | Admitting: Nurse Practitioner

## 2022-09-11 ENCOUNTER — Non-Acute Institutional Stay (SKILLED_NURSING_FACILITY): Payer: Medicare Other | Admitting: Nurse Practitioner

## 2022-09-11 DIAGNOSIS — N400 Enlarged prostate without lower urinary tract symptoms: Secondary | ICD-10-CM

## 2022-09-11 DIAGNOSIS — E1122 Type 2 diabetes mellitus with diabetic chronic kidney disease: Secondary | ICD-10-CM

## 2022-09-11 DIAGNOSIS — N1831 Chronic kidney disease, stage 3a: Secondary | ICD-10-CM | POA: Diagnosis not present

## 2022-09-11 DIAGNOSIS — F01B0 Vascular dementia, moderate, without behavioral disturbance, psychotic disturbance, mood disturbance, and anxiety: Secondary | ICD-10-CM | POA: Diagnosis not present

## 2022-09-11 DIAGNOSIS — C7800 Secondary malignant neoplasm of unspecified lung: Secondary | ICD-10-CM

## 2022-09-11 DIAGNOSIS — K219 Gastro-esophageal reflux disease without esophagitis: Secondary | ICD-10-CM | POA: Diagnosis not present

## 2022-09-11 DIAGNOSIS — K5903 Drug induced constipation: Secondary | ICD-10-CM | POA: Diagnosis not present

## 2022-09-11 DIAGNOSIS — D508 Other iron deficiency anemias: Secondary | ICD-10-CM

## 2022-09-11 NOTE — Progress Notes (Signed)
Location:   [facility] Room Number: [place] of Service:  SNF [phone]) Provider:  Sherrie Mustache, NP  Dewayne Shorter, MD  Patient Care Team: Dewayne Shorter, MD as PCP - General (Family Medicine) Wellington Hampshire, MD as PCP - Cardiology (Cardiology) Wellington Hampshire, MD as Consulting Physician (Cardiology)  Extended Emergency Contact Information Primary Emergency Contact: [facility] Address: [address], [place] [address] Johnnette Litter of [place] Phone: [phone] Mobile Phone: [phone] Relation: Significant other  Code Status:  DNR Goals of care: Advanced Directive information    09/11/2022    1:34 PM  Advanced Directives  Does Patient Have a Medical Advance Directive? Yes  Type of Paramedic of [place];Living will;Out of facility DNR (pink MOST or yellow form)  Does patient want to make changes to medical advance directive? No - Patient declined  Copy of [place] in Chart? Yes - validated most recent copy scanned in chart (See row information)  Pre-existing out of facility DNR order (yellow form or pink MOST form) Yellow form placed in chart (order not valid for inpatient use)     Chief Complaint  Patient presents with   Medical Management of Chronic Issues    Routine follow up   Immunizations    Shingrix vaccine,tetanus/tdapvaccine, COVID booster,and flu vaccine due   Quality Metric Gaps    Foot exam, eye exam, and hemoglobin A1 C due    HPI:  Pt is a 88 y.o. male seen today for medical management of chronic diseases.  Pt is long term resident at [facility] and under hospice care.  Pt with hx of DM, GERD, dementia, BPH, symptomatic anemia.   Continues on iron for symptomatic anemia in March 2023, 2 units PRBC given during hospitalization.   Left sided lung cancer with suspected liver mets- without pain, shortness  of breath, cough or congestion today.   Ongoing vascular dementia- no concerns from nursing.   DM- continues on metformin, no hypoglycemia noted.     Past Medical History:  Diagnosis Date   Arthritis of knee    BPH (benign prostatic hypertrophy)    w/ nocturia   Coronary artery disease 03/2014   Inferior ST elevation myocardial infarction. Cardiac catheterization showed an occluded mid RCA. He had an angioplasty and drug-eluting stent placement with a 3.0 x 16 mm Promus drug-eluting stent. Ejection fraction was 45% by echo, completed cardiac rehab 06/2014   CVA (cerebral vascular accident) ([place]) 03/2018   Dementia ([place])    s/p stroke   Diabetes type 2, controlled ([place])    Pt is taking Metformin   GERD (gastroesophageal reflux disease)    Heart murmur    keeping an eye on it but not treating   History of basal cell cancer    s/p mohs   History of colon polyps    History of hiatal hernia 05/2018   this causes laryngeal spasms. pt was taking nitro for this when bp drops and he ends up with tia symptoms   History of hypertension    HLD (hyperlipidemia)    diet controlled in past   Hypertension    Insomnia    treated with multiple meds in past   Lung cancer ([place])    MI (myocardial infarction) ([place]) 2015   Rosacea    Skin cancer    Sleep apnea    has cpap  but does not use properly and does not like it!!   Stroke ([place]) 03/2018   per cat scan, patient has had several strokes   TIA (transient ischemic attack)    x 2 since CVA 03-2018   UTI (urinary tract infection)    Wears dentures    partial lower   Wears hearing aid in both ears    Past Surgical History:  Procedure Laterality Date   CARDIAC CATHETERIZATION  03/2014   [facility];x1 stent   CAROTID PTA/STENT INTERVENTION Right 06/05/2018   Algernon Huxley, MD   CATARACT EXTRACTION W/PHACO Left 10/09/2016   Procedure: CATARACT EXTRACTION PHACO AND INTRAOCULAR LENS PLACEMENT ([place]);  Surgeon: Birder Robson, MD;  Location: [facility] ORS;   Service: Ophthalmology;  Laterality: Left;  [place] 1.13 AP% 18.3 CDE 13.45 Fluid pack lot # [id number]   CATARACT EXTRACTION W/PHACO Right 05/17/2020   Procedure: CATARACT EXTRACTION PHACO AND INTRAOCULAR LENS PLACEMENT ([facility]) RIGHT DIABETIC 14.51  01:37.4  ;  Surgeon: Birder Robson, MD;  Location: [facility];  Service: Ophthalmology;  Laterality: Right;  Diabetic - oral meds Requests later AM arrival   COLONOSCOPY  08/2010   hyperplastic polyp, rec rpt 5 yrs   ESOPHAGOGASTRODUODENOSCOPY  05/2015   dilated stricture, normal biopsies, HH, no definite infection [name] @ [facility])   ESOPHAGOGASTRODUODENOSCOPY (EGD) WITH PROPOFOL N/A 09/18/2019   normal esophagus and duodenum, large HH ([place])   EYE SURGERY Left    cataract extraction   MOHS SURGERY     basal cell chin/back   REPLACEMENT TOTAL KNEE Left 08/2006   VASECTOMY  1970    Allergies  Allergen Reactions   Aricept [Donepezil Hcl] Diarrhea    severe   Belsomra [Suvorexant] Other (See Comments)    Anxiety. Did not work   Vancomycin Itching, Rash and Other (See Comments)    Burning pain    Ambien [Zolpidem Tartrate] Other (See Comments)    Gi problems   Atorvastatin     muscle aches   Doxycycline Rash   Penicillins Hives    Has patient had a PCN reaction causing immediate rash, facial/tongue/throat swelling, SOB or lightheadedness with hypotension: Yes Has patient had a PCN reaction causing severe rash involving mucus membranes or skin necrosis: Yes Has patient had a PCN reaction that required hospitalization No Has patient had a PCN reaction occurring within the last 10 years: No If all of the above answers are "NO", then may proceed with Cephalosporin use.    Tamsulosin Other (See Comments)    Leg weakness.     Allergies as of 09/11/2022       Reactions   Aricept [donepezil Hcl] Diarrhea   severe   Belsomra [suvorexant] Other (See Comments)   Anxiety. Did not work   Vancomycin Itching, Rash, Other (See Comments)    Burning pain    Ambien [zolpidem Tartrate] Other (See Comments)   Gi problems   Atorvastatin    muscle aches   Doxycycline Rash   Penicillins Hives   Has patient had a PCN reaction causing immediate rash, facial/tongue/throat swelling, SOB or lightheadedness with hypotension: Yes Has patient had a PCN reaction causing severe rash involving mucus membranes or skin necrosis: Yes Has patient had a PCN reaction that required hospitalization No Has patient had a PCN reaction occurring within the last 10 years: No If all of the above answers are "NO", then may proceed with Cephalosporin use.   Tamsulosin Other (See Comments)   Leg weakness.  Medication List        Accurate as of September 11, 2022  1:52 PM. If you have any questions, ask your nurse or doctor.          STOP taking these medications    docusate sodium 100 MG capsule Commonly known as: COLACE Stopped by: Lauree Chandler, NP   memantine 10 MG tablet Commonly known as: NAMENDA Stopped by: Lauree Chandler, NP   pravastatin 40 MG tablet Commonly known as: PRAVACHOL Stopped by: Lauree Chandler, NP       TAKE these medications    acetaminophen 650 MG CR tablet Commonly known as: TYLENOL Take 650 mg by mouth every 8 (eight) hours.   ARIPiprazole 2 MG tablet Commonly known as: ABILIFY Take 2 mg by mouth daily.   bisacodyl 10 MG suppository Commonly known as: DULCOLAX Place 10 mg rectally daily as needed for moderate constipation.   clonazePAM 1 MG tablet Commonly known as: KLONOPIN Take 1 tablet (1 mg total) by mouth at bedtime.   clopidogrel 75 MG tablet Commonly known as: PLAVIX TAKE 1 TABLET (75 MG TOTAL) BY MOUTH AT BEDTIME.   diclofenac Sodium 1 % Gel Commonly known as: VOLTAREN Apply 2 g topically every 8 (eight) hours as needed (pain).   ferrous sulfate 325 (65 FE) MG tablet Take 1 tablet (325 mg total) by mouth daily with breakfast.   finasteride 5 MG tablet Commonly known  as: PROSCAR TAKE 1 TABLET BY MOUTH EVERY DAY   metFORMIN 500 MG tablet Commonly known as: GLUCOPHAGE Take 1 tablet (500 mg total) by mouth 2 (two) times daily.   multivitamin with minerals Tabs tablet Take 1 tablet by mouth daily.   pantoprazole 40 MG tablet Commonly known as: PROTONIX Take 1 tablet (40 mg total) by mouth 2 (two) times daily.   polyethylene glycol 17 g packet Commonly known as: MIRALAX / GLYCOLAX Take 17 g by mouth every other day.   sodium fluoride 1.1 % Crea dental cream Commonly known as: PREVIDENT 5000 PLUS Place 1 application. onto teeth every evening.   vitamin B-12 500 MCG tablet Commonly known as: CYANOCOBALAMIN Take 500 mcg by mouth daily.        Review of Systems  Constitutional:  Negative for activity change, appetite change, fatigue and unexpected weight change.  HENT:  Negative for congestion and hearing loss.   Eyes: Negative.   Respiratory:  Negative for cough and shortness of breath.   Cardiovascular:  Negative for chest pain, palpitations and leg swelling.  Gastrointestinal:  Negative for abdominal pain, constipation and diarrhea.  Genitourinary:  Negative for difficulty urinating and dysuria.  Musculoskeletal:  Negative for arthralgias and myalgias.  Skin:  Negative for color change and wound.  Neurological:  Negative for dizziness and weakness.  Psychiatric/Behavioral:  Negative for agitation, behavioral problems and confusion.     Immunization History  Administered Date(s) Administered   Influenza Whole 09/05/2013   Influenza, High Dose Seasonal PF 07/27/2016, 08/20/2019, 08/31/2020   Influenza,inj,Quad PF,6+ Mos 09/12/2015, 08/01/2017, 07/25/2018   Influenza-Unspecified 08/12/2014, 08/28/2022   Moderna Sars-Covid-2 Vaccination 11/27/2019, 12/25/2019, 09/23/2020   PPD Test 04/22/2018, 06/02/2019   Pneumococcal Conjugate-13 09/29/2011, 11/29/2014   Pneumococcal Polysaccharide-23 12/20/2017   Td 05/12/2008   Zoster, Live  05/12/2008   Pertinent  Health Maintenance Due  Topic Date Due   FOOT EXAM  06/08/2021   OPHTHALMOLOGY EXAM  02/08/2022   HEMOGLOBIN A1C  07/22/2022   INFLUENZA VACCINE  Completed  01/20/2022    8:27 PM 01/21/2022   10:00 AM 01/21/2022    7:30 PM 01/22/2022    2:00 PM 01/23/2022    1:00 AM  Fall Risk  Patient Fall Risk Level High fall risk High fall risk High fall risk High fall risk High fall risk   Functional Status Survey:    Vitals:   09/11/22 1322  BP: 122/77  Pulse: 83  Resp: 18  Temp: (!) 97.4 F (36.3 C)  SpO2: 96%  Weight: 186 lb 6.4 oz (84.6 kg)  Height: 5\' 8"  (1.727 m)   Body mass index is 28.34 kg/m. Physical Exam Constitutional:      General: He is not in acute distress.    Appearance: He is well-developed. He is not diaphoretic.  HENT:     Head: Normocephalic and atraumatic.     Right Ear: External ear normal.     Left Ear: External ear normal.     Mouth/Throat:     Pharynx: No oropharyngeal exudate.  Eyes:     Conjunctiva/sclera: Conjunctivae normal.     Pupils: Pupils are equal, round, and reactive to light.  Cardiovascular:     Rate and Rhythm: Normal rate and regular rhythm.     Heart sounds: Normal heart sounds.  Pulmonary:     Effort: Pulmonary effort is normal.     Breath sounds: Normal breath sounds.  Abdominal:     General: Bowel sounds are normal.     Palpations: Abdomen is soft.  Musculoskeletal:        General: No tenderness.     Cervical back: Normal range of motion and neck supple.     Right lower leg: No edema.     Left lower leg: No edema.  Skin:    General: Skin is warm and dry.  Neurological:     Mental Status: He is alert. Mental status is at baseline.     Labs reviewed: Recent Labs    01/19/22 1808 01/20/22 0616 01/21/22 0406  NA 137 142 143  K 4.0 3.7 3.9  CL 105 113* 114*  CO2 20* 24 24  GLUCOSE 361* 197* 178*  BUN 41* 32* 23  CREATININE 1.48* 1.24 1.00  CALCIUM 8.0* 8.0* 7.8*  MG  --  2.3  --     Recent Labs    09/15/21 0000 01/19/22 1950 01/20/22 0616  AST 15 50* 43*  ALT 7* 25 24  ALKPHOS 54 62 57  BILITOT  --  0.4 0.7  PROT  --  7.2 6.5  ALBUMIN 3.3* 3.0* 2.9*   Recent Labs    09/15/21 0000 01/19/22 1808 01/20/22 0616 01/21/22 0406 01/22/22 0420 01/22/22 1144 01/23/22 0413  WBC 8.8 10.7* 9.6 10.0  --   --   --   NEUTROABS 5,535.00 8.0* 6.9  --   --   --   --   HGB 10.2* 6.9* 7.6* 7.0* 8.0* 8.8* 8.5*  HCT 32* 24.5* 25.8* 23.7* 27.0* 29.8* 28.3*  MCV  --  86.6 85.1 84.9  --   --   --   PLT 326 376 347 295  --   --   --    Lab Results  Component Value Date   TSH 0.747 04/27/2018   Lab Results  Component Value Date   HGBA1C 8.9 (H) 01/19/2022   Lab Results  Component Value Date   CHOL 120 09/15/2021   HDL 48 09/15/2021   LDLCALC 48 09/15/2021   TRIG 154 09/15/2021  CHOLHDL 2 09/07/2020    Significant Diagnostic Results in last 30 days:  No results found.  Assessment/Plan 1. Malignant neoplasm metastatic to lung, unspecified laterality ([place]) Stable, continues on hospice care.   2. Gastroesophageal reflux disease without esophagitis Controlled on protonix.   3. Chronic kidney disease, stage 3a (HCC) Chronic and stable Encourage proper hydration Follow metabolic panel Avoid nephrotoxic meds ([place])  4. Moderate vascular dementia without behavioral disturbance, psychotic disturbance, mood disturbance, or anxiety (HCC) -Stable, no acute changes in cognitive or functional status, continue supportive care.   5. Benign prostatic hyperplasia, unspecified whether lower urinary tract symptoms present -stable, continues on proscar, no worsening symptoms at this time.   6. Drug-induced constipation Controlled on current bowel regimen.   7. Iron deficiency anemia secondary to inadequate dietary iron intake -continues on iron supplement, follow up cbc  8. Type 2 diabetes mellitus with stage 3a chronic kidney disease, without long-term current use  of insulin ([place]) -continues on metformin -follow up [place]. [place], [facility] [phone]

## 2022-09-12 DIAGNOSIS — I129 Hypertensive chronic kidney disease with stage 1 through stage 4 chronic kidney disease, or unspecified chronic kidney disease: Secondary | ICD-10-CM | POA: Diagnosis not present

## 2022-09-12 DIAGNOSIS — R634 Abnormal weight loss: Secondary | ICD-10-CM | POA: Diagnosis not present

## 2022-09-12 DIAGNOSIS — Z6828 Body mass index (BMI) 28.0-28.9, adult: Secondary | ICD-10-CM | POA: Diagnosis not present

## 2022-09-12 DIAGNOSIS — N1831 Chronic kidney disease, stage 3a: Secondary | ICD-10-CM | POA: Diagnosis not present

## 2022-09-12 DIAGNOSIS — I2510 Atherosclerotic heart disease of native coronary artery without angina pectoris: Secondary | ICD-10-CM | POA: Diagnosis not present

## 2022-09-12 DIAGNOSIS — N400 Enlarged prostate without lower urinary tract symptoms: Secondary | ICD-10-CM | POA: Diagnosis not present

## 2022-09-12 DIAGNOSIS — F0284 Dementia in other diseases classified elsewhere, unspecified severity, with anxiety: Secondary | ICD-10-CM | POA: Diagnosis not present

## 2022-09-12 DIAGNOSIS — J8410 Pulmonary fibrosis, unspecified: Secondary | ICD-10-CM | POA: Diagnosis not present

## 2022-09-12 DIAGNOSIS — D631 Anemia in chronic kidney disease: Secondary | ICD-10-CM | POA: Diagnosis not present

## 2022-09-12 DIAGNOSIS — F05 Delirium due to known physiological condition: Secondary | ICD-10-CM | POA: Diagnosis not present

## 2022-09-12 DIAGNOSIS — R911 Solitary pulmonary nodule: Secondary | ICD-10-CM | POA: Diagnosis not present

## 2022-09-12 DIAGNOSIS — E43 Unspecified severe protein-calorie malnutrition: Secondary | ICD-10-CM | POA: Diagnosis not present

## 2022-09-12 DIAGNOSIS — K2289 Other specified disease of esophagus: Secondary | ICD-10-CM | POA: Diagnosis not present

## 2022-09-12 DIAGNOSIS — E1122 Type 2 diabetes mellitus with diabetic chronic kidney disease: Secondary | ICD-10-CM | POA: Diagnosis not present

## 2022-09-12 DIAGNOSIS — K449 Diaphragmatic hernia without obstruction or gangrene: Secondary | ICD-10-CM | POA: Diagnosis not present

## 2022-09-12 DIAGNOSIS — M1990 Unspecified osteoarthritis, unspecified site: Secondary | ICD-10-CM | POA: Diagnosis not present

## 2022-09-12 DIAGNOSIS — G309 Alzheimer's disease, unspecified: Secondary | ICD-10-CM | POA: Diagnosis not present

## 2022-09-12 DIAGNOSIS — F0154 Vascular dementia, unspecified severity, with anxiety: Secondary | ICD-10-CM | POA: Diagnosis not present

## 2022-09-12 DIAGNOSIS — I252 Old myocardial infarction: Secondary | ICD-10-CM | POA: Diagnosis not present

## 2022-09-12 DIAGNOSIS — E8809 Other disorders of plasma-protein metabolism, not elsewhere classified: Secondary | ICD-10-CM | POA: Diagnosis not present

## 2022-09-12 DIAGNOSIS — K219 Gastro-esophageal reflux disease without esophagitis: Secondary | ICD-10-CM | POA: Diagnosis not present

## 2022-09-12 DIAGNOSIS — R296 Repeated falls: Secondary | ICD-10-CM | POA: Diagnosis not present

## 2022-09-12 DIAGNOSIS — E785 Hyperlipidemia, unspecified: Secondary | ICD-10-CM | POA: Diagnosis not present

## 2022-09-13 DIAGNOSIS — F0154 Vascular dementia, unspecified severity, with anxiety: Secondary | ICD-10-CM | POA: Diagnosis not present

## 2022-09-13 DIAGNOSIS — E43 Unspecified severe protein-calorie malnutrition: Secondary | ICD-10-CM | POA: Diagnosis not present

## 2022-09-13 DIAGNOSIS — D631 Anemia in chronic kidney disease: Secondary | ICD-10-CM | POA: Diagnosis not present

## 2022-09-13 DIAGNOSIS — I10 Essential (primary) hypertension: Secondary | ICD-10-CM | POA: Diagnosis not present

## 2022-09-13 DIAGNOSIS — G309 Alzheimer's disease, unspecified: Secondary | ICD-10-CM | POA: Diagnosis not present

## 2022-09-13 DIAGNOSIS — F0284 Dementia in other diseases classified elsewhere, unspecified severity, with anxiety: Secondary | ICD-10-CM | POA: Diagnosis not present

## 2022-09-13 DIAGNOSIS — E109 Type 1 diabetes mellitus without complications: Secondary | ICD-10-CM | POA: Diagnosis not present

## 2022-09-13 DIAGNOSIS — K2289 Other specified disease of esophagus: Secondary | ICD-10-CM | POA: Diagnosis not present

## 2022-09-13 LAB — CBC AND DIFFERENTIAL
HCT: 42 (ref 41–53)
Hemoglobin: 14.2 (ref 13.5–17.5)
Platelets: 504 10*3/uL — AB (ref 150–400)
WBC: 15.1

## 2022-09-13 LAB — HEPATIC FUNCTION PANEL
ALT: 8 U/L — AB (ref 10–40)
AST: 16 (ref 14–40)
Alkaline Phosphatase: 100 (ref 25–125)
Bilirubin, Total: 0.5

## 2022-09-13 LAB — COMPREHENSIVE METABOLIC PANEL
Albumin: 3.4 — AB (ref 3.5–5.0)
Calcium: 9.6 (ref 8.7–10.7)
Globulin: 3.9

## 2022-09-13 LAB — CBC: RBC: 4.63 (ref 3.87–5.11)

## 2022-09-13 LAB — BASIC METABOLIC PANEL
BUN: 9 (ref 4–21)
CO2: 20 (ref 13–22)
Chloride: 96 — AB (ref 99–108)
Creatinine: 1.1 (ref 0.6–1.3)
Glucose: 196
Potassium: 4.8 mEq/L (ref 3.5–5.1)
Sodium: 132 — AB (ref 137–147)

## 2022-09-13 LAB — HEMOGLOBIN A1C: Hemoglobin A1C: 8

## 2022-09-17 DIAGNOSIS — K2289 Other specified disease of esophagus: Secondary | ICD-10-CM | POA: Diagnosis not present

## 2022-09-17 DIAGNOSIS — D631 Anemia in chronic kidney disease: Secondary | ICD-10-CM | POA: Diagnosis not present

## 2022-09-17 DIAGNOSIS — E43 Unspecified severe protein-calorie malnutrition: Secondary | ICD-10-CM | POA: Diagnosis not present

## 2022-09-17 DIAGNOSIS — F0284 Dementia in other diseases classified elsewhere, unspecified severity, with anxiety: Secondary | ICD-10-CM | POA: Diagnosis not present

## 2022-09-17 DIAGNOSIS — G309 Alzheimer's disease, unspecified: Secondary | ICD-10-CM | POA: Diagnosis not present

## 2022-09-17 DIAGNOSIS — F0154 Vascular dementia, unspecified severity, with anxiety: Secondary | ICD-10-CM | POA: Diagnosis not present

## 2022-09-19 DIAGNOSIS — D631 Anemia in chronic kidney disease: Secondary | ICD-10-CM | POA: Diagnosis not present

## 2022-09-19 DIAGNOSIS — K2289 Other specified disease of esophagus: Secondary | ICD-10-CM | POA: Diagnosis not present

## 2022-09-19 DIAGNOSIS — F0154 Vascular dementia, unspecified severity, with anxiety: Secondary | ICD-10-CM | POA: Diagnosis not present

## 2022-09-19 DIAGNOSIS — F0284 Dementia in other diseases classified elsewhere, unspecified severity, with anxiety: Secondary | ICD-10-CM | POA: Diagnosis not present

## 2022-09-19 DIAGNOSIS — E43 Unspecified severe protein-calorie malnutrition: Secondary | ICD-10-CM | POA: Diagnosis not present

## 2022-09-19 DIAGNOSIS — G309 Alzheimer's disease, unspecified: Secondary | ICD-10-CM | POA: Diagnosis not present

## 2022-09-20 DIAGNOSIS — F0154 Vascular dementia, unspecified severity, with anxiety: Secondary | ICD-10-CM | POA: Diagnosis not present

## 2022-09-20 DIAGNOSIS — G309 Alzheimer's disease, unspecified: Secondary | ICD-10-CM | POA: Diagnosis not present

## 2022-09-20 DIAGNOSIS — D631 Anemia in chronic kidney disease: Secondary | ICD-10-CM | POA: Diagnosis not present

## 2022-09-20 DIAGNOSIS — K2289 Other specified disease of esophagus: Secondary | ICD-10-CM | POA: Diagnosis not present

## 2022-09-20 DIAGNOSIS — E43 Unspecified severe protein-calorie malnutrition: Secondary | ICD-10-CM | POA: Diagnosis not present

## 2022-09-20 DIAGNOSIS — F0284 Dementia in other diseases classified elsewhere, unspecified severity, with anxiety: Secondary | ICD-10-CM | POA: Diagnosis not present

## 2022-09-21 DIAGNOSIS — Z23 Encounter for immunization: Secondary | ICD-10-CM | POA: Diagnosis not present

## 2022-09-24 DIAGNOSIS — F0154 Vascular dementia, unspecified severity, with anxiety: Secondary | ICD-10-CM | POA: Diagnosis not present

## 2022-09-24 DIAGNOSIS — F0284 Dementia in other diseases classified elsewhere, unspecified severity, with anxiety: Secondary | ICD-10-CM | POA: Diagnosis not present

## 2022-09-24 DIAGNOSIS — E43 Unspecified severe protein-calorie malnutrition: Secondary | ICD-10-CM | POA: Diagnosis not present

## 2022-09-24 DIAGNOSIS — K2289 Other specified disease of esophagus: Secondary | ICD-10-CM | POA: Diagnosis not present

## 2022-09-24 DIAGNOSIS — D631 Anemia in chronic kidney disease: Secondary | ICD-10-CM | POA: Diagnosis not present

## 2022-09-24 DIAGNOSIS — G309 Alzheimer's disease, unspecified: Secondary | ICD-10-CM | POA: Diagnosis not present

## 2022-09-25 DIAGNOSIS — F0154 Vascular dementia, unspecified severity, with anxiety: Secondary | ICD-10-CM | POA: Diagnosis not present

## 2022-09-25 DIAGNOSIS — K2289 Other specified disease of esophagus: Secondary | ICD-10-CM | POA: Diagnosis not present

## 2022-09-25 DIAGNOSIS — E43 Unspecified severe protein-calorie malnutrition: Secondary | ICD-10-CM | POA: Diagnosis not present

## 2022-09-25 DIAGNOSIS — G309 Alzheimer's disease, unspecified: Secondary | ICD-10-CM | POA: Diagnosis not present

## 2022-09-25 DIAGNOSIS — F0284 Dementia in other diseases classified elsewhere, unspecified severity, with anxiety: Secondary | ICD-10-CM | POA: Diagnosis not present

## 2022-09-25 DIAGNOSIS — D631 Anemia in chronic kidney disease: Secondary | ICD-10-CM | POA: Diagnosis not present

## 2022-09-26 DIAGNOSIS — F0284 Dementia in other diseases classified elsewhere, unspecified severity, with anxiety: Secondary | ICD-10-CM | POA: Diagnosis not present

## 2022-09-26 DIAGNOSIS — K2289 Other specified disease of esophagus: Secondary | ICD-10-CM | POA: Diagnosis not present

## 2022-09-26 DIAGNOSIS — E43 Unspecified severe protein-calorie malnutrition: Secondary | ICD-10-CM | POA: Diagnosis not present

## 2022-09-26 DIAGNOSIS — D631 Anemia in chronic kidney disease: Secondary | ICD-10-CM | POA: Diagnosis not present

## 2022-09-26 DIAGNOSIS — F0154 Vascular dementia, unspecified severity, with anxiety: Secondary | ICD-10-CM | POA: Diagnosis not present

## 2022-09-26 DIAGNOSIS — G309 Alzheimer's disease, unspecified: Secondary | ICD-10-CM | POA: Diagnosis not present

## 2022-10-01 DIAGNOSIS — G309 Alzheimer's disease, unspecified: Secondary | ICD-10-CM | POA: Diagnosis not present

## 2022-10-01 DIAGNOSIS — F0154 Vascular dementia, unspecified severity, with anxiety: Secondary | ICD-10-CM | POA: Diagnosis not present

## 2022-10-01 DIAGNOSIS — F0284 Dementia in other diseases classified elsewhere, unspecified severity, with anxiety: Secondary | ICD-10-CM | POA: Diagnosis not present

## 2022-10-01 DIAGNOSIS — D631 Anemia in chronic kidney disease: Secondary | ICD-10-CM | POA: Diagnosis not present

## 2022-10-01 DIAGNOSIS — E43 Unspecified severe protein-calorie malnutrition: Secondary | ICD-10-CM | POA: Diagnosis not present

## 2022-10-01 DIAGNOSIS — K2289 Other specified disease of esophagus: Secondary | ICD-10-CM | POA: Diagnosis not present

## 2022-10-02 DIAGNOSIS — K2289 Other specified disease of esophagus: Secondary | ICD-10-CM | POA: Diagnosis not present

## 2022-10-02 DIAGNOSIS — B351 Tinea unguium: Secondary | ICD-10-CM | POA: Diagnosis not present

## 2022-10-02 DIAGNOSIS — D631 Anemia in chronic kidney disease: Secondary | ICD-10-CM | POA: Diagnosis not present

## 2022-10-02 DIAGNOSIS — E43 Unspecified severe protein-calorie malnutrition: Secondary | ICD-10-CM | POA: Diagnosis not present

## 2022-10-02 DIAGNOSIS — E1159 Type 2 diabetes mellitus with other circulatory complications: Secondary | ICD-10-CM | POA: Diagnosis not present

## 2022-10-02 DIAGNOSIS — F0284 Dementia in other diseases classified elsewhere, unspecified severity, with anxiety: Secondary | ICD-10-CM | POA: Diagnosis not present

## 2022-10-02 DIAGNOSIS — F0154 Vascular dementia, unspecified severity, with anxiety: Secondary | ICD-10-CM | POA: Diagnosis not present

## 2022-10-02 DIAGNOSIS — G309 Alzheimer's disease, unspecified: Secondary | ICD-10-CM | POA: Diagnosis not present

## 2022-10-03 DIAGNOSIS — F0154 Vascular dementia, unspecified severity, with anxiety: Secondary | ICD-10-CM | POA: Diagnosis not present

## 2022-10-03 DIAGNOSIS — E43 Unspecified severe protein-calorie malnutrition: Secondary | ICD-10-CM | POA: Diagnosis not present

## 2022-10-03 DIAGNOSIS — F0284 Dementia in other diseases classified elsewhere, unspecified severity, with anxiety: Secondary | ICD-10-CM | POA: Diagnosis not present

## 2022-10-03 DIAGNOSIS — K2289 Other specified disease of esophagus: Secondary | ICD-10-CM | POA: Diagnosis not present

## 2022-10-03 DIAGNOSIS — G309 Alzheimer's disease, unspecified: Secondary | ICD-10-CM | POA: Diagnosis not present

## 2022-10-03 DIAGNOSIS — D631 Anemia in chronic kidney disease: Secondary | ICD-10-CM | POA: Diagnosis not present

## 2022-10-08 DIAGNOSIS — K2289 Other specified disease of esophagus: Secondary | ICD-10-CM | POA: Diagnosis not present

## 2022-10-08 DIAGNOSIS — F0154 Vascular dementia, unspecified severity, with anxiety: Secondary | ICD-10-CM | POA: Diagnosis not present

## 2022-10-08 DIAGNOSIS — D631 Anemia in chronic kidney disease: Secondary | ICD-10-CM | POA: Diagnosis not present

## 2022-10-08 DIAGNOSIS — E43 Unspecified severe protein-calorie malnutrition: Secondary | ICD-10-CM | POA: Diagnosis not present

## 2022-10-08 DIAGNOSIS — G309 Alzheimer's disease, unspecified: Secondary | ICD-10-CM | POA: Diagnosis not present

## 2022-10-08 DIAGNOSIS — F0284 Dementia in other diseases classified elsewhere, unspecified severity, with anxiety: Secondary | ICD-10-CM | POA: Diagnosis not present

## 2022-10-09 DIAGNOSIS — G309 Alzheimer's disease, unspecified: Secondary | ICD-10-CM | POA: Diagnosis not present

## 2022-10-09 DIAGNOSIS — F0284 Dementia in other diseases classified elsewhere, unspecified severity, with anxiety: Secondary | ICD-10-CM | POA: Diagnosis not present

## 2022-10-09 DIAGNOSIS — K2289 Other specified disease of esophagus: Secondary | ICD-10-CM | POA: Diagnosis not present

## 2022-10-09 DIAGNOSIS — F0154 Vascular dementia, unspecified severity, with anxiety: Secondary | ICD-10-CM | POA: Diagnosis not present

## 2022-10-09 DIAGNOSIS — D631 Anemia in chronic kidney disease: Secondary | ICD-10-CM | POA: Diagnosis not present

## 2022-10-09 DIAGNOSIS — E43 Unspecified severe protein-calorie malnutrition: Secondary | ICD-10-CM | POA: Diagnosis not present

## 2022-10-10 DIAGNOSIS — F0284 Dementia in other diseases classified elsewhere, unspecified severity, with anxiety: Secondary | ICD-10-CM | POA: Diagnosis not present

## 2022-10-10 DIAGNOSIS — F0154 Vascular dementia, unspecified severity, with anxiety: Secondary | ICD-10-CM | POA: Diagnosis not present

## 2022-10-10 DIAGNOSIS — E43 Unspecified severe protein-calorie malnutrition: Secondary | ICD-10-CM | POA: Diagnosis not present

## 2022-10-10 DIAGNOSIS — D631 Anemia in chronic kidney disease: Secondary | ICD-10-CM | POA: Diagnosis not present

## 2022-10-10 DIAGNOSIS — G309 Alzheimer's disease, unspecified: Secondary | ICD-10-CM | POA: Diagnosis not present

## 2022-10-10 DIAGNOSIS — K2289 Other specified disease of esophagus: Secondary | ICD-10-CM | POA: Diagnosis not present

## 2022-10-12 DIAGNOSIS — F05 Delirium due to known physiological condition: Secondary | ICD-10-CM | POA: Diagnosis not present

## 2022-10-12 DIAGNOSIS — G309 Alzheimer's disease, unspecified: Secondary | ICD-10-CM | POA: Diagnosis not present

## 2022-10-12 DIAGNOSIS — Z6828 Body mass index (BMI) 28.0-28.9, adult: Secondary | ICD-10-CM | POA: Diagnosis not present

## 2022-10-12 DIAGNOSIS — I129 Hypertensive chronic kidney disease with stage 1 through stage 4 chronic kidney disease, or unspecified chronic kidney disease: Secondary | ICD-10-CM | POA: Diagnosis not present

## 2022-10-12 DIAGNOSIS — R296 Repeated falls: Secondary | ICD-10-CM | POA: Diagnosis not present

## 2022-10-12 DIAGNOSIS — I2510 Atherosclerotic heart disease of native coronary artery without angina pectoris: Secondary | ICD-10-CM | POA: Diagnosis not present

## 2022-10-12 DIAGNOSIS — E43 Unspecified severe protein-calorie malnutrition: Secondary | ICD-10-CM | POA: Diagnosis not present

## 2022-10-12 DIAGNOSIS — C4440 Unspecified malignant neoplasm of skin of scalp and neck: Secondary | ICD-10-CM | POA: Diagnosis not present

## 2022-10-12 DIAGNOSIS — K219 Gastro-esophageal reflux disease without esophagitis: Secondary | ICD-10-CM | POA: Diagnosis not present

## 2022-10-12 DIAGNOSIS — R911 Solitary pulmonary nodule: Secondary | ICD-10-CM | POA: Diagnosis not present

## 2022-10-12 DIAGNOSIS — M1990 Unspecified osteoarthritis, unspecified site: Secondary | ICD-10-CM | POA: Diagnosis not present

## 2022-10-12 DIAGNOSIS — D631 Anemia in chronic kidney disease: Secondary | ICD-10-CM | POA: Diagnosis not present

## 2022-10-12 DIAGNOSIS — F0284 Dementia in other diseases classified elsewhere, unspecified severity, with anxiety: Secondary | ICD-10-CM | POA: Diagnosis not present

## 2022-10-12 DIAGNOSIS — E8809 Other disorders of plasma-protein metabolism, not elsewhere classified: Secondary | ICD-10-CM | POA: Diagnosis not present

## 2022-10-12 DIAGNOSIS — I252 Old myocardial infarction: Secondary | ICD-10-CM | POA: Diagnosis not present

## 2022-10-12 DIAGNOSIS — F0154 Vascular dementia, unspecified severity, with anxiety: Secondary | ICD-10-CM | POA: Diagnosis not present

## 2022-10-12 DIAGNOSIS — Z741 Need for assistance with personal care: Secondary | ICD-10-CM | POA: Diagnosis not present

## 2022-10-12 DIAGNOSIS — J8410 Pulmonary fibrosis, unspecified: Secondary | ICD-10-CM | POA: Diagnosis not present

## 2022-10-12 DIAGNOSIS — K449 Diaphragmatic hernia without obstruction or gangrene: Secondary | ICD-10-CM | POA: Diagnosis not present

## 2022-10-12 DIAGNOSIS — R634 Abnormal weight loss: Secondary | ICD-10-CM | POA: Diagnosis not present

## 2022-10-12 DIAGNOSIS — E1122 Type 2 diabetes mellitus with diabetic chronic kidney disease: Secondary | ICD-10-CM | POA: Diagnosis not present

## 2022-10-12 DIAGNOSIS — K2289 Other specified disease of esophagus: Secondary | ICD-10-CM | POA: Diagnosis not present

## 2022-10-12 DIAGNOSIS — N1831 Chronic kidney disease, stage 3a: Secondary | ICD-10-CM | POA: Diagnosis not present

## 2022-10-12 DIAGNOSIS — N400 Enlarged prostate without lower urinary tract symptoms: Secondary | ICD-10-CM | POA: Diagnosis not present

## 2022-10-12 DIAGNOSIS — E785 Hyperlipidemia, unspecified: Secondary | ICD-10-CM | POA: Diagnosis not present

## 2022-10-15 DIAGNOSIS — F0284 Dementia in other diseases classified elsewhere, unspecified severity, with anxiety: Secondary | ICD-10-CM | POA: Diagnosis not present

## 2022-10-15 DIAGNOSIS — F0154 Vascular dementia, unspecified severity, with anxiety: Secondary | ICD-10-CM | POA: Diagnosis not present

## 2022-10-15 DIAGNOSIS — K2289 Other specified disease of esophagus: Secondary | ICD-10-CM | POA: Diagnosis not present

## 2022-10-15 DIAGNOSIS — G309 Alzheimer's disease, unspecified: Secondary | ICD-10-CM | POA: Diagnosis not present

## 2022-10-15 DIAGNOSIS — E43 Unspecified severe protein-calorie malnutrition: Secondary | ICD-10-CM | POA: Diagnosis not present

## 2022-10-15 DIAGNOSIS — D631 Anemia in chronic kidney disease: Secondary | ICD-10-CM | POA: Diagnosis not present

## 2022-10-17 DIAGNOSIS — F0154 Vascular dementia, unspecified severity, with anxiety: Secondary | ICD-10-CM | POA: Diagnosis not present

## 2022-10-17 DIAGNOSIS — D631 Anemia in chronic kidney disease: Secondary | ICD-10-CM | POA: Diagnosis not present

## 2022-10-17 DIAGNOSIS — F0284 Dementia in other diseases classified elsewhere, unspecified severity, with anxiety: Secondary | ICD-10-CM | POA: Diagnosis not present

## 2022-10-17 DIAGNOSIS — E43 Unspecified severe protein-calorie malnutrition: Secondary | ICD-10-CM | POA: Diagnosis not present

## 2022-10-17 DIAGNOSIS — G309 Alzheimer's disease, unspecified: Secondary | ICD-10-CM | POA: Diagnosis not present

## 2022-10-17 DIAGNOSIS — K2289 Other specified disease of esophagus: Secondary | ICD-10-CM | POA: Diagnosis not present

## 2022-10-18 DIAGNOSIS — D631 Anemia in chronic kidney disease: Secondary | ICD-10-CM | POA: Diagnosis not present

## 2022-10-18 DIAGNOSIS — E43 Unspecified severe protein-calorie malnutrition: Secondary | ICD-10-CM | POA: Diagnosis not present

## 2022-10-18 DIAGNOSIS — F0154 Vascular dementia, unspecified severity, with anxiety: Secondary | ICD-10-CM | POA: Diagnosis not present

## 2022-10-18 DIAGNOSIS — G309 Alzheimer's disease, unspecified: Secondary | ICD-10-CM | POA: Diagnosis not present

## 2022-10-18 DIAGNOSIS — K2289 Other specified disease of esophagus: Secondary | ICD-10-CM | POA: Diagnosis not present

## 2022-10-18 DIAGNOSIS — F0284 Dementia in other diseases classified elsewhere, unspecified severity, with anxiety: Secondary | ICD-10-CM | POA: Diagnosis not present

## 2022-10-22 DIAGNOSIS — F0154 Vascular dementia, unspecified severity, with anxiety: Secondary | ICD-10-CM | POA: Diagnosis not present

## 2022-10-22 DIAGNOSIS — G309 Alzheimer's disease, unspecified: Secondary | ICD-10-CM | POA: Diagnosis not present

## 2022-10-22 DIAGNOSIS — E43 Unspecified severe protein-calorie malnutrition: Secondary | ICD-10-CM | POA: Diagnosis not present

## 2022-10-22 DIAGNOSIS — K2289 Other specified disease of esophagus: Secondary | ICD-10-CM | POA: Diagnosis not present

## 2022-10-22 DIAGNOSIS — D631 Anemia in chronic kidney disease: Secondary | ICD-10-CM | POA: Diagnosis not present

## 2022-10-22 DIAGNOSIS — F0284 Dementia in other diseases classified elsewhere, unspecified severity, with anxiety: Secondary | ICD-10-CM | POA: Diagnosis not present

## 2022-10-24 DIAGNOSIS — D631 Anemia in chronic kidney disease: Secondary | ICD-10-CM | POA: Diagnosis not present

## 2022-10-24 DIAGNOSIS — F0284 Dementia in other diseases classified elsewhere, unspecified severity, with anxiety: Secondary | ICD-10-CM | POA: Diagnosis not present

## 2022-10-24 DIAGNOSIS — G309 Alzheimer's disease, unspecified: Secondary | ICD-10-CM | POA: Diagnosis not present

## 2022-10-24 DIAGNOSIS — K2289 Other specified disease of esophagus: Secondary | ICD-10-CM | POA: Diagnosis not present

## 2022-10-24 DIAGNOSIS — E43 Unspecified severe protein-calorie malnutrition: Secondary | ICD-10-CM | POA: Diagnosis not present

## 2022-10-24 DIAGNOSIS — F0154 Vascular dementia, unspecified severity, with anxiety: Secondary | ICD-10-CM | POA: Diagnosis not present

## 2022-10-29 DIAGNOSIS — E43 Unspecified severe protein-calorie malnutrition: Secondary | ICD-10-CM | POA: Diagnosis not present

## 2022-10-29 DIAGNOSIS — G309 Alzheimer's disease, unspecified: Secondary | ICD-10-CM | POA: Diagnosis not present

## 2022-10-29 DIAGNOSIS — K2289 Other specified disease of esophagus: Secondary | ICD-10-CM | POA: Diagnosis not present

## 2022-10-29 DIAGNOSIS — D631 Anemia in chronic kidney disease: Secondary | ICD-10-CM | POA: Diagnosis not present

## 2022-10-29 DIAGNOSIS — F0154 Vascular dementia, unspecified severity, with anxiety: Secondary | ICD-10-CM | POA: Diagnosis not present

## 2022-10-29 DIAGNOSIS — F0284 Dementia in other diseases classified elsewhere, unspecified severity, with anxiety: Secondary | ICD-10-CM | POA: Diagnosis not present

## 2022-10-31 DIAGNOSIS — F0154 Vascular dementia, unspecified severity, with anxiety: Secondary | ICD-10-CM | POA: Diagnosis not present

## 2022-10-31 DIAGNOSIS — G309 Alzheimer's disease, unspecified: Secondary | ICD-10-CM | POA: Diagnosis not present

## 2022-10-31 DIAGNOSIS — E43 Unspecified severe protein-calorie malnutrition: Secondary | ICD-10-CM | POA: Diagnosis not present

## 2022-10-31 DIAGNOSIS — K2289 Other specified disease of esophagus: Secondary | ICD-10-CM | POA: Diagnosis not present

## 2022-10-31 DIAGNOSIS — D631 Anemia in chronic kidney disease: Secondary | ICD-10-CM | POA: Diagnosis not present

## 2022-10-31 DIAGNOSIS — F0284 Dementia in other diseases classified elsewhere, unspecified severity, with anxiety: Secondary | ICD-10-CM | POA: Diagnosis not present

## 2022-11-05 DIAGNOSIS — E43 Unspecified severe protein-calorie malnutrition: Secondary | ICD-10-CM | POA: Diagnosis not present

## 2022-11-05 DIAGNOSIS — F0154 Vascular dementia, unspecified severity, with anxiety: Secondary | ICD-10-CM | POA: Diagnosis not present

## 2022-11-05 DIAGNOSIS — K2289 Other specified disease of esophagus: Secondary | ICD-10-CM | POA: Diagnosis not present

## 2022-11-05 DIAGNOSIS — G309 Alzheimer's disease, unspecified: Secondary | ICD-10-CM | POA: Diagnosis not present

## 2022-11-05 DIAGNOSIS — F0284 Dementia in other diseases classified elsewhere, unspecified severity, with anxiety: Secondary | ICD-10-CM | POA: Diagnosis not present

## 2022-11-05 DIAGNOSIS — D631 Anemia in chronic kidney disease: Secondary | ICD-10-CM | POA: Diagnosis not present

## 2022-11-07 DIAGNOSIS — K2289 Other specified disease of esophagus: Secondary | ICD-10-CM | POA: Diagnosis not present

## 2022-11-07 DIAGNOSIS — D631 Anemia in chronic kidney disease: Secondary | ICD-10-CM | POA: Diagnosis not present

## 2022-11-07 DIAGNOSIS — E43 Unspecified severe protein-calorie malnutrition: Secondary | ICD-10-CM | POA: Diagnosis not present

## 2022-11-07 DIAGNOSIS — F0284 Dementia in other diseases classified elsewhere, unspecified severity, with anxiety: Secondary | ICD-10-CM | POA: Diagnosis not present

## 2022-11-07 DIAGNOSIS — F0154 Vascular dementia, unspecified severity, with anxiety: Secondary | ICD-10-CM | POA: Diagnosis not present

## 2022-11-07 DIAGNOSIS — G309 Alzheimer's disease, unspecified: Secondary | ICD-10-CM | POA: Diagnosis not present

## 2022-11-09 ENCOUNTER — Encounter: Payer: Self-pay | Admitting: Student

## 2022-11-09 NOTE — Progress Notes (Deleted)
Location:  Other [place].  Nursing Home Room Number: [place] of Service:  SNF [phone]) Provider:  Dr. Amada Kingfisher, MD  Patient Care Team: Dewayne Shorter, MD as PCP - General (Family Medicine) Wellington Hampshire, MD as PCP - Cardiology (Cardiology) Wellington Hampshire, MD as Consulting Physician (Cardiology)  Extended Emergency Contact Information Primary Emergency Contact: [facility] Address: [address], [place] [address] Johnnette Litter of [place] Phone: [phone] Mobile Phone: [phone] Relation: Significant other  Code Status:  DNR Goals of care: Advanced Directive information    11/09/2022   10:01 AM  Advanced Directives  Does Patient Have a Medical Advance Directive? Yes  Type of Advance Directive Living will;Out of facility DNR (pink MOST or yellow form)  Does patient want to make changes to medical advance directive? No - Patient declined     Chief Complaint  Patient presents with   Medical Management of Chronic Issues    Medical Management of Chronic Issues.     HPI:  Pt is a 88 y.o. male seen today for medical management of chronic diseases.     Past Medical History:  Diagnosis Date   Arthritis of knee    BPH (benign prostatic hypertrophy)    w/ nocturia   Coronary artery disease 03/2014   Inferior ST elevation myocardial infarction. Cardiac catheterization showed an occluded mid RCA. He had an angioplasty and drug-eluting stent placement with a 3.0 x 16 mm Promus drug-eluting stent. Ejection fraction was 45% by echo, completed cardiac rehab 06/2014   CVA (cerebral vascular accident) ([place]) 03/2018   Dementia ([place])    s/p stroke   Diabetes type 2, controlled ([place])    Pt is taking Metformin   GERD (gastroesophageal reflux disease)    Heart murmur    keeping an eye on it but not treating   History of basal cell cancer    s/p mohs   History of colon polyps    History of hiatal  hernia 05/2018   this causes laryngeal spasms. pt was taking nitro for this when bp drops and he ends up with tia symptoms   History of hypertension    HLD (hyperlipidemia)    diet controlled in past   Hypertension    Insomnia    treated with multiple meds in past   Lung cancer ([place])    MI (myocardial infarction) ([place]) 2015   Rosacea    Skin cancer    Sleep apnea    has cpap but does not use properly and does not like it!!   Stroke ([place]) 03/2018   per cat scan, patient has had several strokes   TIA (transient ischemic attack)    x 2 since CVA 03-2018   UTI (urinary tract infection)    Wears dentures    partial lower   Wears hearing aid in both ears    Past Surgical History:  Procedure Laterality Date   CARDIAC CATHETERIZATION  03/2014   [facility];x1 stent   CAROTID PTA/STENT INTERVENTION Right 06/05/2018   Algernon Huxley, MD   CATARACT EXTRACTION W/PHACO Left 10/09/2016   Procedure: CATARACT EXTRACTION PHACO AND INTRAOCULAR LENS PLACEMENT ([place]);  Surgeon: Birder Robson, MD;  Location: [facility] ORS;  Service: Ophthalmology;  Laterality: Left;  [place] 1.13 AP% 18.3 CDE 13.45 Fluid pack lot # [id number]   CATARACT EXTRACTION W/PHACO Right 05/17/2020   Procedure: CATARACT EXTRACTION PHACO AND INTRAOCULAR LENS  PLACEMENT (I[facility]) RIGHT DIABETIC 14.51  01:37.4  ;  Surgeon: Birder Robson, MD;  Location: [place];  Service: Ophthalmology;  Laterality: Right;  Diabetic - oral meds Requests later AM arrival   COLONOSCOPY  08/2010   hyperplastic polyp, rec rpt 5 yrs   ESOPHAGOGASTRODUODENOSCOPY  05/2015   dilated stricture, normal biopsies, HH, no definite infection [name] @ [facility])   ESOPHAGOGASTRODUODENOSCOPY (EGD) WITH PROPOFOL N/A 09/18/2019   normal esophagus and duodenum, large HH ([place])   EYE SURGERY Left    cataract extraction   MOHS SURGERY     basal cell chin/back   REPLACEMENT TOTAL KNEE Left 08/2006   VASECTOMY  1970    Allergies  Allergen Reactions   Aricept [Donepezil Hcl]  Diarrhea    severe   Belsomra [Suvorexant] Other (See Comments)    Anxiety. Did not work   Vancomycin Itching, Rash and Other (See Comments)    Burning pain    Ambien [Zolpidem Tartrate] Other (See Comments)    Gi problems   Atorvastatin     muscle aches   Doxycycline Rash   Penicillins Hives    Has patient had a PCN reaction causing immediate rash, facial/tongue/throat swelling, SOB or lightheadedness with hypotension: Yes Has patient had a PCN reaction causing severe rash involving mucus membranes or skin necrosis: Yes Has patient had a PCN reaction that required hospitalization No Has patient had a PCN reaction occurring within the last 10 years: No If all of the above answers are "NO", then may proceed with Cephalosporin use.    Tamsulosin Other (See Comments)    Leg weakness.     Outpatient Encounter Medications as of 11/09/2022  Medication Sig   acetaminophen (TYLENOL) 650 MG CR tablet Take 650 mg by mouth every 8 (eight) hours.   ARIPiprazole (ABILIFY) 2 MG tablet Take 2 mg by mouth daily.   bisacodyl (DULCOLAX) 10 MG suppository Place 10 mg rectally daily as needed for moderate constipation.   clonazePAM (KLONOPIN) 1 MG tablet Take 1 tablet (1 mg total) by mouth at bedtime.   clopidogrel (PLAVIX) 75 MG tablet TAKE 1 TABLET (75 MG TOTAL) BY MOUTH AT BEDTIME.   diclofenac Sodium (VOLTAREN) 1 % GEL Apply 2 g topically every 8 (eight) hours as needed (pain).   Ensure (ENSURE) Take 237 mLs by mouth daily.   ferrous sulfate 325 (65 FE) MG tablet Take 1 tablet (325 mg total) by mouth daily with breakfast.   finasteride (PROSCAR) 5 MG tablet TAKE 1 TABLET BY MOUTH EVERY DAY   metFORMIN (GLUCOPHAGE) 500 MG tablet Take 1 tablet (500 mg total) by mouth 2 (two) times daily.   Multiple Vitamin (MULTIVITAMIN WITH MINERALS) TABS tablet Take 1 tablet by mouth daily.   pantoprazole (PROTONIX) 40 MG tablet Take 1 tablet (40 mg total) by mouth 2 (two) times daily.   polyethylene glycol  (MIRALAX / GLYCOLAX) 17 g packet Take 17 g by mouth every other day.   sodium fluoride (PREVIDENT 5000 PLUS) 1.1 % CREA dental cream Place 1 application. onto teeth every evening.   vitamin B-12 (CYANOCOBALAMIN) 500 MCG tablet Take 500 mcg by mouth daily.   No facility-administered encounter medications on file as of 11/09/2022.    Review of Systems  Immunization History  Administered Date(s) Administered   Influenza Whole 09/05/2013   Influenza, High Dose Seasonal PF 07/27/2016, 08/20/2019, 08/31/2020   Influenza,inj,Quad PF,6+ Mos 09/12/2015, 08/01/2017, 07/25/2018   Influenza-Unspecified 08/12/2014, 08/28/2022   Moderna Sars-Covid-2 Vaccination 11/27/2019, 12/25/2019, 09/23/2020  PPD Test 04/22/2018, 06/02/2019   Pneumococcal Conjugate-13 09/29/2011, 11/29/2014   Pneumococcal Polysaccharide-23 12/20/2017   Td 05/12/2008   Zoster, Live 05/12/2008   Pertinent  Health Maintenance Due  Topic Date Due   FOOT EXAM  06/08/2021   OPHTHALMOLOGY EXAM  02/08/2022   HEMOGLOBIN A1C  07/22/2022   INFLUENZA VACCINE  Completed      01/20/2022    8:27 PM 01/21/2022   10:00 AM 01/21/2022    7:30 PM 01/22/2022    2:00 PM 01/23/2022    1:00 AM  Fall Risk  Patient Fall Risk Level High fall risk High fall risk High fall risk High fall risk High fall risk   Functional Status Survey:    There were no vitals filed for this visit. There is no height or weight on file to calculate BMI. Physical Exam  Labs reviewed: Recent Labs    01/19/22 1808 01/20/22 0616 01/21/22 0406  NA 137 142 143  K 4.0 3.7 3.9  CL 105 113* 114*  CO2 20* 24 24  GLUCOSE 361* 197* 178*  BUN 41* 32* 23  CREATININE 1.48* 1.24 1.00  CALCIUM 8.0* 8.0* 7.8*  MG  --  2.3  --    Recent Labs    01/19/22 1950 01/20/22 0616  AST 50* 43*  ALT 25 24  ALKPHOS 62 57  BILITOT 0.4 0.7  PROT 7.2 6.5  ALBUMIN 3.0* 2.9*   Recent Labs    01/19/22 1808 01/20/22 0616 01/21/22 0406 01/22/22 0420 01/22/22 1144  01/23/22 0413  WBC 10.7* 9.6 10.0  --   --   --   NEUTROABS 8.0* 6.9  --   --   --   --   HGB 6.9* 7.6* 7.0* 8.0* 8.8* 8.5*  HCT 24.5* 25.8* 23.7* 27.0* 29.8* 28.3*  MCV 86.6 85.1 84.9  --   --   --   PLT 376 347 295  --   --   --    Lab Results  Component Value Date   TSH 0.747 04/27/2018   Lab Results  Component Value Date   HGBA1C 8.9 (H) 01/19/2022   Lab Results  Component Value Date   CHOL 120 09/15/2021   HDL 48 09/15/2021   LDLCALC 48 09/15/2021   TRIG 154 09/15/2021   CHOLHDL 2 09/07/2020    Significant Diagnostic Results in last 30 days:  No results found.  Assessment/Plan There are no diagnoses linked to this encounter.   Family/ staff Communication: ***  Labs/tests ordered:  ***

## 2022-11-10 NOTE — Progress Notes (Signed)
This encounter was created in error - please disregard.

## 2022-11-12 DIAGNOSIS — G309 Alzheimer's disease, unspecified: Secondary | ICD-10-CM | POA: Diagnosis not present

## 2022-11-12 DIAGNOSIS — E8809 Other disorders of plasma-protein metabolism, not elsewhere classified: Secondary | ICD-10-CM | POA: Diagnosis not present

## 2022-11-12 DIAGNOSIS — Z741 Need for assistance with personal care: Secondary | ICD-10-CM | POA: Diagnosis not present

## 2022-11-12 DIAGNOSIS — C4440 Unspecified malignant neoplasm of skin of scalp and neck: Secondary | ICD-10-CM | POA: Diagnosis not present

## 2022-11-12 DIAGNOSIS — I129 Hypertensive chronic kidney disease with stage 1 through stage 4 chronic kidney disease, or unspecified chronic kidney disease: Secondary | ICD-10-CM | POA: Diagnosis not present

## 2022-11-12 DIAGNOSIS — K2289 Other specified disease of esophagus: Secondary | ICD-10-CM | POA: Diagnosis not present

## 2022-11-12 DIAGNOSIS — F0154 Vascular dementia, unspecified severity, with anxiety: Secondary | ICD-10-CM | POA: Diagnosis not present

## 2022-11-12 DIAGNOSIS — N1831 Chronic kidney disease, stage 3a: Secondary | ICD-10-CM | POA: Diagnosis not present

## 2022-11-12 DIAGNOSIS — F0284 Dementia in other diseases classified elsewhere, unspecified severity, with anxiety: Secondary | ICD-10-CM | POA: Diagnosis not present

## 2022-11-12 DIAGNOSIS — Z6828 Body mass index (BMI) 28.0-28.9, adult: Secondary | ICD-10-CM | POA: Diagnosis not present

## 2022-11-12 DIAGNOSIS — R634 Abnormal weight loss: Secondary | ICD-10-CM | POA: Diagnosis not present

## 2022-11-12 DIAGNOSIS — I2510 Atherosclerotic heart disease of native coronary artery without angina pectoris: Secondary | ICD-10-CM | POA: Diagnosis not present

## 2022-11-12 DIAGNOSIS — D631 Anemia in chronic kidney disease: Secondary | ICD-10-CM | POA: Diagnosis not present

## 2022-11-12 DIAGNOSIS — N400 Enlarged prostate without lower urinary tract symptoms: Secondary | ICD-10-CM | POA: Diagnosis not present

## 2022-11-12 DIAGNOSIS — K219 Gastro-esophageal reflux disease without esophagitis: Secondary | ICD-10-CM | POA: Diagnosis not present

## 2022-11-12 DIAGNOSIS — E43 Unspecified severe protein-calorie malnutrition: Secondary | ICD-10-CM | POA: Diagnosis not present

## 2022-11-12 DIAGNOSIS — E785 Hyperlipidemia, unspecified: Secondary | ICD-10-CM | POA: Diagnosis not present

## 2022-11-12 DIAGNOSIS — K449 Diaphragmatic hernia without obstruction or gangrene: Secondary | ICD-10-CM | POA: Diagnosis not present

## 2022-11-12 DIAGNOSIS — E1122 Type 2 diabetes mellitus with diabetic chronic kidney disease: Secondary | ICD-10-CM | POA: Diagnosis not present

## 2022-11-12 DIAGNOSIS — I252 Old myocardial infarction: Secondary | ICD-10-CM | POA: Diagnosis not present

## 2022-11-12 DIAGNOSIS — M1990 Unspecified osteoarthritis, unspecified site: Secondary | ICD-10-CM | POA: Diagnosis not present

## 2022-11-12 DIAGNOSIS — R296 Repeated falls: Secondary | ICD-10-CM | POA: Diagnosis not present

## 2022-11-12 DIAGNOSIS — F05 Delirium due to known physiological condition: Secondary | ICD-10-CM | POA: Diagnosis not present

## 2022-11-12 DIAGNOSIS — J8410 Pulmonary fibrosis, unspecified: Secondary | ICD-10-CM | POA: Diagnosis not present

## 2022-11-12 DIAGNOSIS — R911 Solitary pulmonary nodule: Secondary | ICD-10-CM | POA: Diagnosis not present

## 2022-11-13 ENCOUNTER — Encounter: Payer: Self-pay | Admitting: Nurse Practitioner

## 2022-11-13 ENCOUNTER — Non-Acute Institutional Stay (SKILLED_NURSING_FACILITY): Payer: Medicare Other | Admitting: Nurse Practitioner

## 2022-11-13 DIAGNOSIS — F01B0 Vascular dementia, moderate, without behavioral disturbance, psychotic disturbance, mood disturbance, and anxiety: Secondary | ICD-10-CM

## 2022-11-13 DIAGNOSIS — K2289 Other specified disease of esophagus: Secondary | ICD-10-CM | POA: Diagnosis not present

## 2022-11-13 DIAGNOSIS — N400 Enlarged prostate without lower urinary tract symptoms: Secondary | ICD-10-CM

## 2022-11-13 DIAGNOSIS — H6123 Impacted cerumen, bilateral: Secondary | ICD-10-CM | POA: Diagnosis not present

## 2022-11-13 DIAGNOSIS — K219 Gastro-esophageal reflux disease without esophagitis: Secondary | ICD-10-CM

## 2022-11-13 DIAGNOSIS — C7800 Secondary malignant neoplasm of unspecified lung: Secondary | ICD-10-CM | POA: Diagnosis not present

## 2022-11-13 DIAGNOSIS — E43 Unspecified severe protein-calorie malnutrition: Secondary | ICD-10-CM | POA: Diagnosis not present

## 2022-11-13 DIAGNOSIS — F0154 Vascular dementia, unspecified severity, with anxiety: Secondary | ICD-10-CM | POA: Diagnosis not present

## 2022-11-13 DIAGNOSIS — N1831 Chronic kidney disease, stage 3a: Secondary | ICD-10-CM | POA: Diagnosis not present

## 2022-11-13 DIAGNOSIS — Z66 Do not resuscitate: Secondary | ICD-10-CM

## 2022-11-13 DIAGNOSIS — F0284 Dementia in other diseases classified elsewhere, unspecified severity, with anxiety: Secondary | ICD-10-CM | POA: Diagnosis not present

## 2022-11-13 DIAGNOSIS — E1122 Type 2 diabetes mellitus with diabetic chronic kidney disease: Secondary | ICD-10-CM

## 2022-11-13 DIAGNOSIS — D631 Anemia in chronic kidney disease: Secondary | ICD-10-CM | POA: Diagnosis not present

## 2022-11-13 DIAGNOSIS — G309 Alzheimer's disease, unspecified: Secondary | ICD-10-CM | POA: Diagnosis not present

## 2022-11-13 NOTE — Progress Notes (Signed)
Location:  Other Nursing Home Room Number: [place] of Service:  SNF (31) Provider:  Carlos American. Dewaine Oats, NP   Patient Care Team: Dewayne Shorter, MD as PCP - General (Family Medicine) Wellington Hampshire, MD as PCP - Cardiology (Cardiology) Wellington Hampshire, MD as Consulting Physician (Cardiology)  Extended Emergency Contact Information Primary Emergency Contact: [facility] Address: [address]          [place], [place] [address] Johnnette Litter of [place] Phone: [phone] Mobile Phone: [phone] Relation: Significant other  Code Status:  DNR Goals of care: Advanced Directive information    11/13/2022    1:55 PM  Advanced Directives  Does Patient Have a Medical Advance Directive? Yes  Type of Advance Directive Living will;Out of facility DNR (pink MOST or yellow form)  Does patient want to make changes to medical advance directive? No - Patient declined  Pre-existing out of facility DNR order (yellow form or pink MOST form) Yellow form placed in chart (order not valid for inpatient use)     Chief Complaint  Patient presents with   Medical Management of Chronic Issues    Routine visit. Foot exam today. Discuss need for eye exam, covid boosters, td/tdap, and shingrix.    Acute Visit    Ear concerns     HPI:  Pt is a 89 y.o. male seen today for medical management of chronic diseases.  Pt currently on hospice due to  Lung cancer with suspected lung mets.  Pt with hx of DM, GERD, dementia, BPH and anemia.   Family reports pt gets ear wax frequently and would like ears evaluated.   Pt without shortness of breath, cough or congestion.   BPH- continues on proscar  GERD- stable on protonix    Past Medical History:  Diagnosis Date   Arthritis of knee    BPH (benign prostatic hypertrophy)    w/ nocturia   Coronary artery disease 03/2014   Inferior ST elevation myocardial infarction. Cardiac catheterization showed an occluded mid RCA. He had an  angioplasty and drug-eluting stent placement with a 3.0 x 16 mm Promus drug-eluting stent. Ejection fraction was 45% by echo, completed cardiac rehab 06/2014   CVA (cerebral vascular accident) ([place]) 03/2018   Dementia ([place])    s/p stroke   Diabetes type 2, controlled ([employer])    Pt is taking Metformin   GERD (gastroesophageal reflux disease)    Heart murmur    keeping an eye on it but not treating   History of basal cell cancer    s/p mohs   History of colon polyps    History of hiatal hernia 05/2018   this causes laryngeal spasms. pt was taking nitro for this when bp drops and he ends up with tia symptoms   History of hypertension    HLD (hyperlipidemia)    diet controlled in past   Hypertension    Insomnia    treated with multiple meds in past   Lung cancer ([place])    MI (myocardial infarction) ([place]) 2015   Rosacea    Skin cancer    Sleep apnea    has cpap but does not use properly and does not like it!!   Stroke ([place]) 03/2018   per cat scan, patient has had several strokes   TIA (transient ischemic attack)    x 2 since CVA 03-2018   UTI (urinary tract infection)    Wears dentures    partial lower   Wears hearing  aid in both ears    Past Surgical History:  Procedure Laterality Date   CARDIAC CATHETERIZATION  03/2014   [facility];x1 stent   CAROTID PTA/STENT INTERVENTION Right 06/05/2018   Algernon Huxley, MD   CATARACT EXTRACTION W/PHACO Left 10/09/2016   Procedure: CATARACT EXTRACTION PHACO AND INTRAOCULAR LENS PLACEMENT ([facility]);  Surgeon: Birder Robson, MD;  Location: [facility] OR[facility];  Service: Ophthalmology;  Laterality: Left;  [place] 1.13 AP% 18.3 CDE 13.45 Fluid pack lot # [id number]   CATARACT EXTRACTION W/PHACO Right 05/17/2020   Procedure: CATARACT EXTRACTION PHACO AND INTRAOCULAR LENS PLACEMENT ([facility]) RIGHT DIABETIC 14.51  01:37.4  ;  Surgeon: Birder Robson, MD;  Location: [place];  Service: Ophthalmology;  Laterality: Right;  Diabetic - oral meds Requests later AM arrival    COLONOSCOPY  08/2010   hyperplastic polyp, rec rpt 5 yrs   ESOPHAGOGASTRODUODENOSCOPY  05/2015   dilated stricture, normal biopsies, HH, no definite infection [name] @ [facility])   ESOPHAGOGASTRODUODENOSCOPY (EGD) WITH PROPOFOL N/A 09/18/2019   normal esophagus and duodenum, large HH ([place])   EYE SURGERY Left    cataract extraction   MOHS SURGERY     basal cell chin/back   REPLACEMENT TOTAL KNEE Left 08/2006   VASECTOMY  1970    Allergies  Allergen Reactions   Aricept [Donepezil Hcl] Diarrhea    severe   Belsomra [Suvorexant] Other (See Comments)    Anxiety. Did not work   Vancomycin Itching, Rash and Other (See Comments)    Burning pain    Ambien [Zolpidem Tartrate] Other (See Comments)    Gi problems   Atorvastatin     muscle aches   Doxycycline Rash   Penicillins Hives    Has patient had a PCN reaction causing immediate rash, facial/tongue/throat swelling, SOB or lightheadedness with hypotension: Yes Has patient had a PCN reaction causing severe rash involving mucus membranes or skin necrosis: Yes Has patient had a PCN reaction that required hospitalization No Has patient had a PCN reaction occurring within the last 10 years: No If all of the above answers are "NO", then may proceed with Cephalosporin use.    Tamsulosin Other (See Comments)    Leg weakness.     Outpatient Encounter Medications as of 11/13/2022  Medication Sig   acetaminophen (TYLENOL) 650 MG CR tablet Take 650 mg by mouth 3 (three) times daily.   ARIPiprazole (ABILIFY) 2 MG tablet Take 2 mg by mouth daily.   bisacodyl (DULCOLAX) 10 MG suppository Place 10 mg rectally daily as needed for moderate constipation.   carbamide peroxide (DEBROX) 6.5 % OTIC solution Place 5 drops into the left ear 2 (two) times daily.   clonazePAM (KLONOPIN) 1 MG tablet Take 1 tablet (1 mg total) by mouth at bedtime.   clopidogrel (PLAVIX) 75 MG tablet TAKE 1 TABLET (75 MG TOTAL) BY MOUTH AT BEDTIME.   diclofenac Sodium (VOLTAREN) 1  % GEL Apply 2 g topically every 8 (eight) hours as needed (pain).   Ensure (ENSURE) Take 237 mLs by mouth daily.   ferrous sulfate 325 (65 FE) MG tablet Take 1 tablet (325 mg total) by mouth daily with breakfast.   finasteride (PROSCAR) 5 MG tablet TAKE 1 TABLET BY MOUTH EVERY DAY   metFORMIN (GLUCOPHAGE) 500 MG tablet Take 1 tablet (500 mg total) by mouth 2 (two) times daily.   Multiple Vitamin (MULTIVITAMIN WITH MINERALS) TABS tablet Take 1 tablet by mouth daily.   pantoprazole (PROTONIX) 40 MG tablet Take 1 tablet (40 mg total)  by mouth 2 (two) times daily.   polyethylene glycol (MIRALAX / GLYCOLAX) 17 g packet Take 17 g by mouth every other day.   sodium fluoride (PREVIDENT 5000 PLUS) 1.1 % CREA dental cream Place 1 application. onto teeth every evening.   vitamin B-12 (CYANOCOBALAMIN) 500 MCG tablet Take 500 mcg by mouth daily.   No facility-administered encounter medications on file as of 11/13/2022.    Review of Systems  Unable to perform ROS: Dementia    Immunization History  Administered Date(s) Administered   Influenza Whole 09/05/2013   Influenza, High Dose Seasonal PF 07/27/2016, 08/20/2019, 08/31/2020   Influenza,inj,Quad PF,6+ Mos 09/12/2015, 08/01/2017, 07/25/2018   Influenza-Unspecified 08/12/2014, 08/28/2022   Moderna Covid-19 Vaccine Bivalent Booster 84yrs & up 04/10/2022   Moderna SARS-COV2 Booster Vaccination 09/27/2020, 03/30/2021   Moderna Sars-Covid-2 Vaccination 11/27/2019, 12/25/2019   PPD Test 04/22/2018, 06/02/2019   Pfizer Covid-19 Vaccine Bivalent Booster 37yrs & up 08/03/2021   Pneumococcal Conjugate-13 09/29/2011, 11/29/2014   Pneumococcal Polysaccharide-23 12/20/2017   Td 05/12/2008   Zoster, Live 05/12/2008   Pertinent  Health Maintenance Due  Topic Date Due   FOOT EXAM  06/08/2021   OPHTHALMOLOGY EXAM  02/08/2022   HEMOGLOBIN A1C  03/14/2023   INFLUENZA VACCINE  Completed      01/20/2022    8:27 PM 01/21/2022   10:00 AM 01/21/2022    7:30 PM  01/22/2022    2:00 PM 01/23/2022    1:00 AM  Fall Risk  Patient Fall Risk Level High fall risk High fall risk High fall risk High fall risk High fall risk   Functional Status Survey:    Vitals:   11/13/22 1346  BP: 122/77  Pulse: 83  Weight: 173 lb (78.5 kg)  Height: 5\' 8"  (1.727 m)   Body mass index is 26.3 kg/m. Physical Exam Constitutional:      General: He is not in acute distress.    Appearance: He is well-developed. He is not diaphoretic.  HENT:     Head: Normocephalic and atraumatic.     Right Ear: External ear normal.     Left Ear: External ear normal.     Mouth/Throat:     Pharynx: No oropharyngeal exudate.  Eyes:     Conjunctiva/sclera: Conjunctivae normal.     Pupils: Pupils are equal, round, and reactive to light.  Cardiovascular:     Rate and Rhythm: Normal rate and regular rhythm.     Heart sounds: Normal heart sounds.  Pulmonary:     Effort: Pulmonary effort is normal.     Breath sounds: Normal breath sounds.  Abdominal:     General: Bowel sounds are normal.     Palpations: Abdomen is soft.  Musculoskeletal:        General: No tenderness.     Cervical back: Normal range of motion and neck supple.     Right lower leg: No edema.     Left lower leg: No edema.  Skin:    General: Skin is warm and dry.  Neurological:     Mental Status: He is alert and oriented to person, place, and time.     Labs reviewed: Recent Labs    01/19/22 1808 01/20/22 0616 01/21/22 0406 09/13/22 0000  NA 137 142 143 132*  K 4.0 3.7 3.9 4.8  CL 105 113* 114* 96*  CO2 20* 24 24 20   GLUCOSE 361* 197* 178*  --   BUN 41* 32* 23 9  CREATININE 1.48* 1.24 1.00 1.1  CALCIUM  8.0* 8.0* 7.8* 9.6  MG  --  2.3  --   --    Recent Labs    01/19/22 1950 01/20/22 0616 09/13/22 0000  AST 50* 43* 16  ALT 25 24 8*  ALKPHOS 62 57 100  BILITOT 0.4 0.7  --   PROT 7.2 6.5  --   ALBUMIN 3.0* 2.9* 3.4*   Recent Labs    01/19/22 1808 01/20/22 0616 01/21/22 0406 01/22/22 0420  01/22/22 1144 01/23/22 0413 09/13/22 0000  WBC 10.7* 9.6 10.0  --   --   --  15.1  NEUTROABS 8.0* 6.9  --   --   --   --   --   HGB 6.9* 7.6* 7.0*   < > 8.8* 8.5* 14.2  HCT 24.5* 25.8* 23.7*   < > 29.8* 28.3* 42  MCV 86.6 85.1 84.9  --   --   --   --   PLT 376 347 295  --   --   --  504*   < > = values in this interval not displayed.   Lab Results  Component Value Date   TSH 0.747 04/27/2018   Lab Results  Component Value Date   HGBA1C 8.0 09/13/2022   Lab Results  Component Value Date   CHOL 120 09/15/2021   HDL 48 09/15/2021   LDLCALC 48 09/15/2021   TRIG 154 09/15/2021   CHOLHDL 2 09/07/2020    Significant Diagnostic Results in last 30 days:  No results found.  Assessment/Plan 1. Bilateral impacted cerumen -will have staff debrox bilateral ears for 4 days then rinse with saline.   2. Do not resuscitate - Do not attempt resuscitation (DNR)  3. Malignant neoplasm metastatic to lung, unspecified laterality ([place]) -continues on hospice care, no shortness of breath, cough or congestion.   4. Gastroesophageal reflux disease without esophagitis -controlled on Protonix  5. Chronic kidney disease, stage 3a (HCC) Chronic and stable Encourage proper hydration Follow metabolic panel Avoid nephrotoxic meds (NSAIDS)  6. Moderate vascular dementia without behavioral disturbance, psychotic disturbance, mood disturbance, or anxiety (HCC) -Stable, no acute changes in cognitive or functional status, continue supportive care.   7. Benign prostatic hyperplasia, unspecified whether lower urinary tract symptoms present -stble on proscar  8. Type 2 diabetes mellitus with stage 3a chronic kidney disease, without long-term current use of insulin (HCC) -A1c at goal for age and comorbidies    Carlos American. [place], [facility] [phone]

## 2022-11-14 DIAGNOSIS — E43 Unspecified severe protein-calorie malnutrition: Secondary | ICD-10-CM | POA: Diagnosis not present

## 2022-11-14 DIAGNOSIS — D631 Anemia in chronic kidney disease: Secondary | ICD-10-CM | POA: Diagnosis not present

## 2022-11-14 DIAGNOSIS — G309 Alzheimer's disease, unspecified: Secondary | ICD-10-CM | POA: Diagnosis not present

## 2022-11-14 DIAGNOSIS — K2289 Other specified disease of esophagus: Secondary | ICD-10-CM | POA: Diagnosis not present

## 2022-11-14 DIAGNOSIS — F0284 Dementia in other diseases classified elsewhere, unspecified severity, with anxiety: Secondary | ICD-10-CM | POA: Diagnosis not present

## 2022-11-14 DIAGNOSIS — F0154 Vascular dementia, unspecified severity, with anxiety: Secondary | ICD-10-CM | POA: Diagnosis not present

## 2022-11-19 DIAGNOSIS — E43 Unspecified severe protein-calorie malnutrition: Secondary | ICD-10-CM | POA: Diagnosis not present

## 2022-11-19 DIAGNOSIS — F0284 Dementia in other diseases classified elsewhere, unspecified severity, with anxiety: Secondary | ICD-10-CM | POA: Diagnosis not present

## 2022-11-19 DIAGNOSIS — K2289 Other specified disease of esophagus: Secondary | ICD-10-CM | POA: Diagnosis not present

## 2022-11-19 DIAGNOSIS — G309 Alzheimer's disease, unspecified: Secondary | ICD-10-CM | POA: Diagnosis not present

## 2022-11-19 DIAGNOSIS — F0154 Vascular dementia, unspecified severity, with anxiety: Secondary | ICD-10-CM | POA: Diagnosis not present

## 2022-11-19 DIAGNOSIS — D631 Anemia in chronic kidney disease: Secondary | ICD-10-CM | POA: Diagnosis not present

## 2022-11-20 DIAGNOSIS — F0154 Vascular dementia, unspecified severity, with anxiety: Secondary | ICD-10-CM | POA: Diagnosis not present

## 2022-11-20 DIAGNOSIS — D631 Anemia in chronic kidney disease: Secondary | ICD-10-CM | POA: Diagnosis not present

## 2022-11-20 DIAGNOSIS — E43 Unspecified severe protein-calorie malnutrition: Secondary | ICD-10-CM | POA: Diagnosis not present

## 2022-11-20 DIAGNOSIS — F0284 Dementia in other diseases classified elsewhere, unspecified severity, with anxiety: Secondary | ICD-10-CM | POA: Diagnosis not present

## 2022-11-20 DIAGNOSIS — G309 Alzheimer's disease, unspecified: Secondary | ICD-10-CM | POA: Diagnosis not present

## 2022-11-20 DIAGNOSIS — K2289 Other specified disease of esophagus: Secondary | ICD-10-CM | POA: Diagnosis not present

## 2022-11-21 DIAGNOSIS — G309 Alzheimer's disease, unspecified: Secondary | ICD-10-CM | POA: Diagnosis not present

## 2022-11-21 DIAGNOSIS — K2289 Other specified disease of esophagus: Secondary | ICD-10-CM | POA: Diagnosis not present

## 2022-11-21 DIAGNOSIS — E43 Unspecified severe protein-calorie malnutrition: Secondary | ICD-10-CM | POA: Diagnosis not present

## 2022-11-21 DIAGNOSIS — F0284 Dementia in other diseases classified elsewhere, unspecified severity, with anxiety: Secondary | ICD-10-CM | POA: Diagnosis not present

## 2022-11-21 DIAGNOSIS — D631 Anemia in chronic kidney disease: Secondary | ICD-10-CM | POA: Diagnosis not present

## 2022-11-21 DIAGNOSIS — F0154 Vascular dementia, unspecified severity, with anxiety: Secondary | ICD-10-CM | POA: Diagnosis not present

## 2022-11-22 ENCOUNTER — Encounter: Payer: Self-pay | Admitting: Nurse Practitioner

## 2022-11-22 ENCOUNTER — Non-Acute Institutional Stay (SKILLED_NURSING_FACILITY): Payer: Medicare Other | Admitting: Nurse Practitioner

## 2022-11-22 DIAGNOSIS — R0602 Shortness of breath: Secondary | ICD-10-CM

## 2022-11-22 DIAGNOSIS — R059 Cough, unspecified: Secondary | ICD-10-CM | POA: Diagnosis not present

## 2022-11-22 MED ORDER — MORPHINE SULFATE 20 MG/5ML PO SOLN: 5.0000 mg | ORAL | 0 refills | Status: AC | PRN

## 2022-11-22 NOTE — Progress Notes (Deleted)
Location:  Other [facility]) Nursing Home Room Number: [place] of Service:  SNF (31)  Jessica K. Dewaine Oats, NP   Patient Care Team: Dewayne Shorter, MD as PCP - General (Family Medicine) Wellington Hampshire, MD as PCP - Cardiology (Cardiology) Wellington Hampshire, MD as Consulting Physician (Cardiology)  Extended Emergency Contact Information Primary Emergency Contact: [facility] ([facility]) Address: [address], [place] [address] Johnnette Litter of [place] Phone: [phone] Mobile Phone: [phone] Relation: Significant other  Goals of care: Advanced Directive information    11/22/2022   12:42 PM  Advanced Directives  Does Patient Have a Medical Advance Directive? Yes  Type of Advance Directive Living will;Out of facility DNR (pink MOST or yellow form)  Does patient want to make changes to medical advance directive? No - Patient declined  Pre-existing out of facility DNR order (yellow form or pink MOST form) Yellow form placed in chart (order not valid for inpatient use)     Chief Complaint  Patient presents with   Acute Visit    Shortness of breath and coughing     HPI:  Pt is a 89 y.o. male seen today for an acute visit for    Past Medical History:  Diagnosis Date   Arthritis of knee    BPH (benign prostatic hypertrophy)    w/ nocturia   Coronary artery disease 03/2014   Inferior ST elevation myocardial infarction. Cardiac catheterization showed an occluded mid RCA. He had an angioplasty and drug-eluting stent placement with a 3.0 x 16 mm Promus drug-eluting stent. Ejection fraction was 45% by echo, completed cardiac rehab 06/2014   CVA (cerebral vascular accident) ([place]) 03/2018   Dementia ([place])    s/p stroke   Diabetes type 2, controlled ([place])    Pt is taking Metformin   GERD (gastroesophageal reflux disease)    Heart murmur    keeping an eye on it but not treating   History of basal cell cancer    s/p mohs   History of colon  polyps    History of hiatal hernia 05/2018   this causes laryngeal spasms. pt was taking nitro for this when bp drops and he ends up with tia symptoms   History of hypertension    HLD (hyperlipidemia)    diet controlled in past   Hypertension    Insomnia    treated with multiple meds in past   Lung cancer ([place])    MI (myocardial infarction) ([place]) 2015   Rosacea    Skin cancer    Sleep apnea    has cpap but does not use properly and does not like it!!   Stroke ([place]) 03/2018   per cat scan, patient has had several strokes   TIA (transient ischemic attack)    x 2 since CVA 03-2018   UTI (urinary tract infection)    Wears dentures    partial lower   Wears hearing aid in both ears    Past Surgical History:  Procedure Laterality Date   CARDIAC CATHETERIZATION  03/2014   [facility];x1 stent   CAROTID PTA/STENT INTERVENTION Right 06/05/2018   Algernon Huxley, MD   CATARACT EXTRACTION W/PHACO Left 10/09/2016   Procedure: CATARACT EXTRACTION PHACO AND INTRAOCULAR LENS PLACEMENT ([place]);  Surgeon: Birder Robson, MD;  Location: [facility] ORS;  Service: Ophthalmology;  Laterality: Left;  [place] 1.13 AP% 18.3 CDE 13.45 Fluid pack lot # [id number]   CATARACT EXTRACTION W/PHACO Right  05/17/2020   Procedure: CATARACT EXTRACTION PHACO AND INTRAOCULAR LENS PLACEMENT (I[facility]) RIGHT DIABETIC 14.51  01:37.4  ;  Surgeon: Galen Manila, MD;  Location: [facility];  Service: Ophthalmology;  Laterality: Right;  Diabetic - oral meds Requests later AM arrival   COLONOSCOPY  08/2010   hyperplastic polyp, rec rpt 5 yrs   ESOPHAGOGASTRODUODENOSCOPY  05/2015   dilated stricture, normal biopsies, HH, no definite infection Lucretia Roers @ [facility])   ESOPHAGOGASTRODUODENOSCOPY (EGD) WITH PROPOFOL N/A 09/18/2019   normal esophagus and duodenum, large HH ([place])   EYE SURGERY Left    cataract extraction   MOHS SURGERY     basal cell chin/back   REPLACEMENT TOTAL KNEE Left 08/2006   VASECTOMY  1970    Allergies  Allergen  Reactions   Aricept [Donepezil Hcl] Diarrhea    severe   Belsomra [Suvorexant] Other (See Comments)    Anxiety. Did not work   Vancomycin Itching, Rash and Other (See Comments)    Burning pain    Ambien [Zolpidem Tartrate] Other (See Comments)    Gi problems   Atorvastatin     muscle aches   Doxycycline Rash   Penicillins Hives    Has patient had a PCN reaction causing immediate rash, facial/tongue/throat swelling, SOB or lightheadedness with hypotension: Yes Has patient had a PCN reaction causing severe rash involving mucus membranes or skin necrosis: Yes Has patient had a PCN reaction that required hospitalization No Has patient had a PCN reaction occurring within the last 10 years: No If all of the above answers are "NO", then may proceed with Cephalosporin use.    Tamsulosin Other (See Comments)    Leg weakness.     Outpatient Encounter Medications as of 11/22/2022  Medication Sig   acetaminophen (TYLENOL) 650 MG CR tablet Take 650 mg by mouth 3 (three) times daily.   ARIPIPRAZOLE PO Take 1 mg by mouth 3 (three) times daily.   bisacodyl (DULCOLAX) 10 MG suppository Place 10 mg rectally daily as needed for moderate constipation.   clonazePAM (KLONOPIN) 1 MG tablet Take 1 tablet (1 mg total) by mouth at bedtime.   clopidogrel (PLAVIX) 75 MG tablet TAKE 1 TABLET (75 MG TOTAL) BY MOUTH AT BEDTIME.   diclofenac Sodium (VOLTAREN) 1 % GEL Apply 2 g topically every 8 (eight) hours as needed (pain).   Ensure (ENSURE) Take 237 mLs by mouth daily.   ferrous sulfate 325 (65 FE) MG tablet Take 1 tablet (325 mg total) by mouth daily with breakfast.   finasteride (PROSCAR) 5 MG tablet TAKE 1 TABLET BY MOUTH EVERY DAY   metFORMIN (GLUCOPHAGE) 500 MG tablet Take 1 tablet (500 mg total) by mouth 2 (two) times daily.   morphine 20 MG/5ML solution Take 1.3 mLs (5.2 mg total) by mouth every 4 (four) hours as needed for pain.   Multiple Vitamin (MULTIVITAMIN WITH MINERALS) TABS tablet Take 1 tablet  by mouth daily.   pantoprazole (PROTONIX) 40 MG tablet Take 1 tablet (40 mg total) by mouth 2 (two) times daily.   polyethylene glycol (MIRALAX / GLYCOLAX) 17 g packet Take 8.5 g by mouth every other day.   sodium fluoride (PREVIDENT 5000 PLUS) 1.1 % CREA dental cream Place 1 application. onto teeth every evening.   vitamin B-12 (CYANOCOBALAMIN) 500 MCG tablet Take 500 mcg by mouth daily.   [DISCONTINUED] ARIPiprazole (ABILIFY) 2 MG tablet Take 2 mg by mouth daily.   No facility-administered encounter medications on file as of 11/22/2022.    Review  of Systems***  Immunization History  Administered Date(s) Administered   Influenza Whole 09/05/2013   Influenza, High Dose Seasonal PF 07/27/2016, 08/20/2019, 08/31/2020   Influenza,inj,Quad PF,6+ Mos 09/12/2015, 08/01/2017, 07/25/2018   Influenza-Unspecified 08/12/2014, 08/28/2022   Moderna Covid-19 Vaccine Bivalent Booster 21yrs & up 04/10/2022   Moderna SARS-COV2 Booster Vaccination 09/27/2020, 03/30/2021   Moderna Sars-Covid-2 Vaccination 11/27/2019, 12/25/2019   PPD Test 04/22/2018, 06/02/2019   Pfizer Covid-19 Vaccine Bivalent Booster 64yrs & up 08/03/2021   Pneumococcal Conjugate-13 09/29/2011, 11/29/2014   Pneumococcal Polysaccharide-23 12/20/2017   Td 05/12/2008   Zoster, Live 05/12/2008   Pertinent  Health Maintenance Due  Topic Date Due   FOOT EXAM  06/08/2021   OPHTHALMOLOGY EXAM  02/08/2022   HEMOGLOBIN A1C  03/14/2023   INFLUENZA VACCINE  Completed      01/20/2022    8:27 PM 01/21/2022   10:00 AM 01/21/2022    7:30 PM 01/22/2022    2:00 PM 01/23/2022    1:00 AM  Fall Risk  Patient Fall Risk Level High fall risk High fall risk High fall risk High fall risk High fall risk   Functional Status Survey:    Vitals:   11/22/22 1239  BP: 122/77  Pulse: 83  Weight: 173 lb (78.5 kg)  Height: 5\' 8"  (1.727 m)   Body mass index is 26.3 kg/m. Physical Exam***  Labs reviewed: Recent Labs    01/19/22 1808 01/20/22 0616  01/21/22 0406 09/13/22 0000  NA 137 142 143 132*  K 4.0 3.7 3.9 4.8  CL 105 113* 114* 96*  CO2 20* 24 24 20   GLUCOSE 361* 197* 178*  --   BUN 41* 32* 23 9  CREATININE 1.48* 1.24 1.00 1.1  CALCIUM 8.0* 8.0* 7.8* 9.6  MG  --  2.3  --   --    Recent Labs    01/19/22 1950 01/20/22 0616 09/13/22 0000  AST 50* 43* 16  ALT 25 24 8*  ALKPHOS 62 57 100  BILITOT 0.4 0.7  --   PROT 7.2 6.5  --   ALBUMIN 3.0* 2.9* 3.4*   Recent Labs    01/19/22 1808 01/20/22 0616 01/21/22 0406 01/22/22 0420 01/22/22 1144 01/23/22 0413 09/13/22 0000  WBC 10.7* 9.6 10.0  --   --   --  15.1  NEUTROABS 8.0* 6.9  --   --   --   --   --   HGB 6.9* 7.6* 7.0*   < > 8.8* 8.5* 14.2  HCT 24.5* 25.8* 23.7*   < > 29.8* 28.3* 42  MCV 86.6 85.1 84.9  --   --   --   --   PLT 376 347 295  --   --   --  504*   < > = values in this interval not displayed.   Lab Results  Component Value Date   TSH 0.747 04/27/2018   Lab Results  Component Value Date   HGBA1C 8.0 09/13/2022   Lab Results  Component Value Date   CHOL 120 09/15/2021   HDL 48 09/15/2021   LDLCALC 48 09/15/2021   TRIG 154 09/15/2021   CHOLHDL 2 09/07/2020    Significant Diagnostic Results in last 30 days:  No results found.  Assessment/Plan There are no diagnoses linked to this encounter.   13/02/2021. 09/09/2020 [facility] [phone]

## 2022-11-22 NOTE — Progress Notes (Signed)
Location:  Other [facility]) [facility] Home Room Number: 502 A Place of Service:  SNF (31)  Earnestine Mealing, MD  Patient Care Team: Earnestine Mealing, MD as PCP - General (Family Medicine) [place] Ouch, MD as PCP - Cardiology (Cardiology) [place] Ouch, MD as Consulting Physician (Cardiology)  Extended Emergency Contact Information Primary Emergency Contact: [facility] Address: [address]          [place], [place] [address] Darden Amber of [place] Home Phone: [phone] Mobile Phone: [phone] Relation: Significant other  Goals of care: Advanced Directive information    11/22/2022   12:42 PM  Advanced Directives  Does Patient Have a Medical Advance Directive? Yes  Type of Advance Directive Living will;Out of facility DNR (pink MOST or yellow form)  Does patient want to make changes to medical advance directive? No - Patient declined  Pre-existing out of facility DNR order (yellow form or pink MOST form) Yellow form placed in chart (order not valid for inpatient use)     Chief Complaint  Patient presents with   Acute Visit    Shortness of breath and coughing     HPI:  Pt is a 89 y.o. male seen today for an acute visit for shortness of breath and cough.  Pt with hx of dementia, CAD on hospice care due to metastatic lung cancer.  Pt unable to provide history due to dementia.  POA who is his significant other was called. She does not want hospitalization due to lung cancer.    Past Medical History:  Diagnosis Date   Arthritis of knee    BPH (benign prostatic hypertrophy)    w/ nocturia   Coronary artery disease 03/2014   Inferior ST elevation myocardial infarction. Cardiac catheterization showed an occluded mid RCA. He had an angioplasty and drug-eluting stent placement with a 3.0 x 16 mm Promus drug-eluting stent. Ejection fraction was 45% by echo, completed cardiac rehab 06/2014   CVA (cerebral vascular accident) ([facility]) 03/2018   Dementia  ([facility])    s/p stroke   Diabetes type 2, controlled ([facility])    Pt is taking Metformin   GERD (gastroesophageal reflux disease)    Heart murmur    keeping an eye on it but not treating   History of basal cell cancer    s/p mohs   History of colon polyps    History of hiatal hernia 05/2018   this causes laryngeal spasms. pt was taking nitro for this when bp drops and he ends up with tia symptoms   History of hypertension    HLD (hyperlipidemia)    diet controlled in past   Hypertension    Insomnia    treated with multiple meds in past   Lung cancer ([facility])    MI (myocardial infarction) ([facility]) 2015   Rosacea    Skin cancer    Sleep apnea    has cpap but does not use properly and does not like it!!   Stroke ([facility]) 03/2018   per cat scan, patient has had several strokes   TIA (transient ischemic attack)    x 2 since CVA 03-2018   UTI (urinary tract infection)    Wears dentures    partial lower   Wears hearing aid in both ears    Past Surgical History:  Procedure Laterality Date   CARDIAC CATHETERIZATION  03/2014   [facility];x1 stent   CAROTID PTA/STENT INTERVENTION Right 06/05/2018   Annice Needy, MD   CATARACT EXTRACTION W/PHACO Left  10/09/2016   Procedure: CATARACT EXTRACTION PHACO AND INTRAOCULAR LENS PLACEMENT (I[facility]);  Surgeon: Birder Robson, MD;  Location: [facility] ORS;  Service: Ophthalmology;  Laterality: Left;  [place] 1.13 AP% 18.3 CDE 13.45 Fluid pack lot # [id number]   CATARACT EXTRACTION W/PHACO Right 05/17/2020   Procedure: CATARACT EXTRACTION PHACO AND INTRAOCULAR LENS PLACEMENT ([facility]) RIGHT DIABETIC 14.51  01:37.4  ;  Surgeon: Birder Robson, MD;  Location: [place];  Service: Ophthalmology;  Laterality: Right;  Diabetic - oral meds Requests later AM arrival   COLONOSCOPY  08/2010   hyperplastic polyp, rec rpt 5 yrs   ESOPHAGOGASTRODUODENOSCOPY  05/2015   dilated stricture, normal biopsies, HH, no definite infection [name] @ [facility])   ESOPHAGOGASTRODUODENOSCOPY (EGD) WITH  PROPOFOL N/A 09/18/2019   normal esophagus and duodenum, large HH ([facility])   EYE SURGERY Left    cataract extraction   MOHS SURGERY     basal cell chin/back   REPLACEMENT TOTAL KNEE Left 08/2006   VASECTOMY  1970    Allergies  Allergen Reactions   Aricept [Donepezil Hcl] Diarrhea    severe   Belsomra [Suvorexant] Other (See Comments)    Anxiety. Did not work   Vancomycin Itching, Rash and Other (See Comments)    Burning pain    Ambien [Zolpidem Tartrate] Other (See Comments)    Gi problems   Atorvastatin     muscle aches   Doxycycline Rash   Penicillins Hives    Has patient had a PCN reaction causing immediate rash, facial/tongue/throat swelling, SOB or lightheadedness with hypotension: Yes Has patient had a PCN reaction causing severe rash involving mucus membranes or skin necrosis: Yes Has patient had a PCN reaction that required hospitalization No Has patient had a PCN reaction occurring within the last 10 years: No If all of the above answers are "NO", then may proceed with Cephalosporin use.    Tamsulosin Other (See Comments)    Leg weakness.     Outpatient Encounter Medications as of 11/22/2022  Medication Sig   morphine 20 MG/5ML solution Take 1.3 mLs (5.2 mg total) by mouth every 4 (four) hours as needed for pain.   acetaminophen (TYLENOL) 650 MG CR tablet Take 650 mg by mouth 3 (three) times daily.   ARIPiprazole (ABILIFY) 2 MG tablet Take 2 mg by mouth daily.   bisacodyl (DULCOLAX) 10 MG suppository Place 10 mg rectally daily as needed for moderate constipation.   clonazePAM (KLONOPIN) 1 MG tablet Take 1 tablet (1 mg total) by mouth at bedtime.   clopidogrel (PLAVIX) 75 MG tablet TAKE 1 TABLET (75 MG TOTAL) BY MOUTH AT BEDTIME.   diclofenac Sodium (VOLTAREN) 1 % GEL Apply 2 g topically every 8 (eight) hours as needed (pain).   Ensure (ENSURE) Take 237 mLs by mouth daily.   ferrous sulfate 325 (65 FE) MG tablet Take 1 tablet (325 mg total) by mouth daily with  breakfast.   finasteride (PROSCAR) 5 MG tablet TAKE 1 TABLET BY MOUTH EVERY DAY   metFORMIN (GLUCOPHAGE) 500 MG tablet Take 1 tablet (500 mg total) by mouth 2 (two) times daily.   Multiple Vitamin (MULTIVITAMIN WITH MINERALS) TABS tablet Take 1 tablet by mouth daily.   pantoprazole (PROTONIX) 40 MG tablet Take 1 tablet (40 mg total) by mouth 2 (two) times daily.   polyethylene glycol (MIRALAX / GLYCOLAX) 17 g packet Take 17 g by mouth every other day.   sodium fluoride (PREVIDENT 5000 PLUS) 1.1 % CREA dental cream Place 1 application. onto teeth  every evening.   vitamin B-12 (CYANOCOBALAMIN) 500 MCG tablet Take 500 mcg by mouth daily.   No facility-administered encounter medications on file as of 11/22/2022.    Review of Systems  Unable to perform ROS: Dementia    Immunization History  Administered Date(s) Administered   Influenza Whole 09/05/2013   Influenza, High Dose Seasonal PF 07/27/2016, 08/20/2019, 08/31/2020   Influenza,inj,Quad PF,6+ Mos 09/12/2015, 08/01/2017, 07/25/2018   Influenza-Unspecified 08/12/2014, 08/28/2022   Moderna Covid-19 Vaccine Bivalent Booster 63yrs & up 04/10/2022   Moderna SARS-COV2 Booster Vaccination 09/27/2020, 03/30/2021   Moderna Sars-Covid-2 Vaccination 11/27/2019, 12/25/2019   PPD Test 04/22/2018, 06/02/2019   Pfizer Covid-19 Vaccine Bivalent Booster 11yrs & up 08/03/2021   Pneumococcal Conjugate-13 09/29/2011, 11/29/2014   Pneumococcal Polysaccharide-23 12/20/2017   Td 05/12/2008   Zoster, Live 05/12/2008   Pertinent  Health Maintenance Due  Topic Date Due   FOOT EXAM  06/08/2021   OPHTHALMOLOGY EXAM  02/08/2022   HEMOGLOBIN A1C  03/14/2023   INFLUENZA VACCINE  Completed      01/20/2022    8:27 PM 01/21/2022   10:00 AM 01/21/2022    7:30 PM 01/22/2022    2:00 PM 01/23/2022    1:00 AM  Fall Risk  Patient Fall Risk Level High fall risk High fall risk High fall risk High fall risk High fall risk   Functional Status Survey:    Vitals:    11/22/22 1239  BP: 122/77  Pulse: 83  Weight: 173 lb (78.5 kg)  Height: 5\' 8"  (1.727 m)   Body mass index is 26.3 kg/m. Physical Exam Constitutional:      General: He is not in acute distress.    Appearance: He is well-developed. He is not diaphoretic.  HENT:     Head: Normocephalic and atraumatic.     Right Ear: External ear normal.     Left Ear: External ear normal.     Mouth/Throat:     Pharynx: No oropharyngeal exudate.  Eyes:     Conjunctiva/sclera: Conjunctivae normal.     Pupils: Pupils are equal, round, and reactive to light.  Cardiovascular:     Rate and Rhythm: Normal rate and regular rhythm.     Heart sounds: Normal heart sounds.  Pulmonary:     Effort: Pulmonary effort is normal. Tachypnea present.     Breath sounds: Decreased breath sounds present.  Abdominal:     General: Bowel sounds are normal.     Palpations: Abdomen is soft.  Musculoskeletal:        General: No tenderness.     Cervical back: Normal range of motion and neck supple.     Right lower leg: No edema.     Left lower leg: No edema.  Skin:    General: Skin is warm and dry.  Neurological:     Mental Status: He is alert and oriented to person, place, and time.     Labs reviewed: Recent Labs    01/19/22 1808 01/20/22 0616 01/21/22 0406 09/13/22 0000  NA 137 142 143 132*  K 4.0 3.7 3.9 4.8  CL 105 113* 114* 96*  CO2 20* 24 24 20   GLUCOSE 361* 197* 178*  --   BUN 41* 32* 23 9  CREATININE 1.48* 1.24 1.00 1.1  CALCIUM 8.0* 8.0* 7.8* 9.6  MG  --  2.3  --   --    Recent Labs    01/19/22 1950 01/20/22 0616 09/13/22 0000  AST 50* 43* 16  ALT 25 24  8*  ALKPHOS 62 57 100  BILITOT 0.4 0.7  --   PROT 7.2 6.5  --   ALBUMIN 3.0* 2.9* 3.4*   Recent Labs    01/19/22 1808 01/20/22 0616 01/21/22 0406 01/22/22 0420 01/22/22 1144 01/23/22 0413 09/13/22 0000  WBC 10.7* 9.6 10.0  --   --   --  15.1  NEUTROABS 8.0* 6.9  --   --   --   --   --   HGB 6.9* 7.6* 7.0*   < > 8.8* 8.5* 14.2   HCT 24.5* 25.8* 23.7*   < > 29.8* 28.3* 42  MCV 86.6 85.1 84.9  --   --   --   --   PLT 376 347 295  --   --   --  504*   < > = values in this interval not displayed.   Lab Results  Component Value Date   TSH 0.747 04/27/2018   Lab Results  Component Value Date   HGBA1C 8.0 09/13/2022   Lab Results  Component Value Date   CHOL 120 09/15/2021   HDL 48 09/15/2021   LDLCALC 48 09/15/2021   TRIG 154 09/15/2021   CHOLHDL 2 09/07/2020    Significant Diagnostic Results in last 30 days:  No results found.  Assessment/Plan 1. Shortness of breath -will get chest xray, CMP, CBC with diff to evaluate for reservable cause but ultimately POA wants to keep patient comfortable  -nursing to call hospice nurse to update.  - morphine 20 MG/5ML solution; Take 1.3 mLs (5.2 mg total) by mouth every 4 (four) hours as needed for pain or shortness of breath  Dispense: 100 mL; Refill: 0   Jaziah Kwasnik K. Biagio [facility] [phone]

## 2022-11-23 ENCOUNTER — Encounter: Payer: Self-pay | Admitting: Student

## 2022-11-23 ENCOUNTER — Non-Acute Institutional Stay (SKILLED_NURSING_FACILITY): Payer: Medicare Other | Admitting: Student

## 2022-11-23 DIAGNOSIS — I959 Hypotension, unspecified: Secondary | ICD-10-CM

## 2022-11-23 DIAGNOSIS — E871 Hypo-osmolality and hyponatremia: Secondary | ICD-10-CM | POA: Diagnosis not present

## 2022-11-23 DIAGNOSIS — Z66 Do not resuscitate: Secondary | ICD-10-CM

## 2022-11-23 DIAGNOSIS — R0902 Hypoxemia: Secondary | ICD-10-CM

## 2022-11-23 DIAGNOSIS — Z515 Encounter for palliative care: Secondary | ICD-10-CM

## 2022-11-23 DIAGNOSIS — R000 Tachycardia, unspecified: Secondary | ICD-10-CM

## 2022-11-23 DIAGNOSIS — R059 Cough, unspecified: Secondary | ICD-10-CM | POA: Diagnosis not present

## 2022-11-23 NOTE — Progress Notes (Signed)
Location:  Other [facility].  Nursing Home Room Number: [facility] 502A Place of Service:  SNF [phone]) Provider:  Dr. Sherri Rad, MD  Patient Care Team: Earnestine Mealing, MD as PCP - General (Family Medicine) Iran Ouch, MD as PCP - Cardiology (Cardiology) Iran Ouch, MD as Consulting Physician (Cardiology)  Extended Emergency Contact Information Primary Emergency Contact: [facility] Address: [address]          [place], [place] [address] Darden Amber of [place] Home Phone: [phone] Mobile Phone: [phone] Relation: Significant other  Code Status:  DNR Goals of care: Advanced Directive information    11/23/2022   12:56 PM  Advanced Directives  Does Patient Have a Medical Advance Directive? Yes  Type of Advance Directive Living will;Out of facility DNR (pink MOST or yellow form)  Does patient want to make changes to medical advance directive? No - Patient declined     Chief Complaint  Patient presents with   Acute Visit    Oxygen Requirement     HPI:  Pt is a 89 y.o. male seen today for an acute visit for hypoxia, and low blood pressure  Patient is alert, oriented to self. States his wife is Gaylyn Lambert. Unable to give more information.  Spoke with nursing and CXR from yesterday showed RLL pnuemonia. Patient has had difficulty feeding himself today. O2 sats have declined, requireing 2 LNC.   He is oriented to self. Minimally conversant.   Spoke with Steward Ros who states she would prefer to continue comfort measures. Labs are pending, however, she states based on his current stability has no desire to treat. Would consider if patient shows signs of decline. She states his daughter will be out of town until Tuesday, and hopes to maintain his ability until then. She states he is on Hospice and believes comfort would be aligned with his goals.   Past Medical History:  Diagnosis Date   Arthritis of knee    BPH  (benign prostatic hypertrophy)    w/ nocturia   Coronary artery disease 03/2014   Inferior ST elevation myocardial infarction. Cardiac catheterization showed an occluded mid RCA. He had an angioplasty and drug-eluting stent placement with a 3.0 x 16 mm Promus drug-eluting stent. Ejection fraction was 45% by echo, completed cardiac rehab 06/2014   CVA (cerebral vascular accident) (HCC) 03/2018   Dementia (HCC)    s/p stroke   Diabetes type 2, controlled (HCC)    Pt is taking Metformin   GERD (gastroesophageal reflux disease)    Heart murmur    keeping an eye on it but not treating   History of basal cell cancer    s/p mohs   History of colon polyps    History of hiatal hernia 05/2018   this causes laryngeal spasms. pt was taking nitro for this when bp drops and he ends up with tia symptoms   History of hypertension    HLD (hyperlipidemia)    diet controlled in past   Hypertension    Insomnia    treated with multiple meds in past   Lung cancer (HCC)    MI (myocardial infarction) (HCC) 2015   Rosacea    Skin cancer    Sleep apnea    has cpap but does not use properly and does not like it!!   Stroke (HCC) 03/2018   per cat scan, patient has had several strokes   TIA (transient ischemic attack)    x 2  since CVA 03-2018   UTI (urinary tract infection)    Wears dentures    partial lower   Wears hearing aid in both ears    Past Surgical History:  Procedure Laterality Date   CARDIAC CATHETERIZATION  03/2014   [facility];x1 stent   CAROTID PTA/STENT INTERVENTION Right 06/05/2018   Algernon Huxley, MD   CATARACT EXTRACTION W/PHACO Left 10/09/2016   Procedure: CATARACT EXTRACTION PHACO AND INTRAOCULAR LENS PLACEMENT ([place]);  Surgeon: Birder Robson, MD;  Location: [facility] ORS;  Service: Ophthalmology;  Laterality: Left;  [place] 1.13 AP% 18.3 CDE 13.45 Fluid pack lot # [id number]   CATARACT EXTRACTION W/PHACO Right 05/17/2020   Procedure: CATARACT EXTRACTION PHACO AND INTRAOCULAR LENS PLACEMENT ([facility])  RIGHT DIABETIC 14.51  01:37.4  ;  Surgeon: Birder Robson, MD;  Location: [facility];  Service: Ophthalmology;  Laterality: Right;  Diabetic - oral meds Requests later AM arrival   COLONOSCOPY  08/2010   hyperplastic polyp, rec rpt 5 yrs   ESOPHAGOGASTRODUODENOSCOPY  05/2015   dilated stricture, normal biopsies, HH, no definite infection [name] @ [facility])   ESOPHAGOGASTRODUODENOSCOPY (EGD) WITH PROPOFOL N/A 09/18/2019   normal esophagus and duodenum, large HH ([place])   EYE SURGERY Left    cataract extraction   MOHS SURGERY     basal cell chin/back   REPLACEMENT TOTAL KNEE Left 08/2006   VASECTOMY  1970    Allergies  Allergen Reactions   Aricept [Donepezil Hcl] Diarrhea    severe   Belsomra [Suvorexant] Other (See Comments)    Anxiety. Did not work   Vancomycin Itching, Rash and Other (See Comments)    Burning pain    Ambien [Zolpidem Tartrate] Other (See Comments)    Gi problems   Atorvastatin     muscle aches   Doxycycline Rash   Penicillins Hives    Has patient had a PCN reaction causing immediate rash, facial/tongue/throat swelling, SOB or lightheadedness with hypotension: Yes Has patient had a PCN reaction causing severe rash involving mucus membranes or skin necrosis: Yes Has patient had a PCN reaction that required hospitalization No Has patient had a PCN reaction occurring within the last 10 years: No If all of the above answers are "NO", then may proceed with Cephalosporin use.    Tamsulosin Other (See Comments)    Leg weakness.     Outpatient Encounter Medications as of 11/23/2022  Medication Sig   acetaminophen (TYLENOL) 650 MG CR tablet Take 650 mg by mouth 3 (three) times daily.   ARIPIPRAZOLE PO Take 1 mg by mouth 3 (three) times daily.   bisacodyl (DULCOLAX) 10 MG suppository Place 10 mg rectally daily as needed for moderate constipation.   clonazePAM (KLONOPIN) 1 MG tablet Take 1 tablet (1 mg total) by mouth at bedtime.   clopidogrel (PLAVIX) 75 MG  tablet TAKE 1 TABLET (75 MG TOTAL) BY MOUTH AT BEDTIME.   diclofenac Sodium (VOLTAREN) 1 % GEL Apply 2 g topically every 8 (eight) hours as needed (pain).   Ensure (ENSURE) Take 237 mLs by mouth daily.   ferrous sulfate 325 (65 FE) MG tablet Take 1 tablet (325 mg total) by mouth daily with breakfast.   finasteride (PROSCAR) 5 MG tablet TAKE 1 TABLET BY MOUTH EVERY DAY   metFORMIN (GLUCOPHAGE) 500 MG tablet Take 1 tablet (500 mg total) by mouth 2 (two) times daily.   morphine 20 MG/5ML solution Take 1.3 mLs (5.2 mg total) by mouth every 4 (four) hours as needed for pain.   Multiple  Vitamin (MULTIVITAMIN WITH MINERALS) TABS tablet Take 1 tablet by mouth daily.   pantoprazole (PROTONIX) 40 MG tablet Take 1 tablet (40 mg total) by mouth 2 (two) times daily.   polyethylene glycol (MIRALAX / GLYCOLAX) 17 g packet Take 8.5 g by mouth every other day.   sodium fluoride (PREVIDENT 5000 PLUS) 1.1 % CREA dental cream Place 1 application. onto teeth every evening.   vitamin B-12 (CYANOCOBALAMIN) 500 MCG tablet Take 500 mcg by mouth daily.   No facility-administered encounter medications on file as of 11/23/2022.    Review of Systems  Immunization History  Administered Date(s) Administered   Influenza Whole 09/05/2013   Influenza, High Dose Seasonal PF 07/27/2016, 08/20/2019, 08/31/2020   Influenza,inj,Quad PF,6+ Mos 09/12/2015, 08/01/2017, 07/25/2018   Influenza-Unspecified 08/12/2014, 08/28/2022   Moderna Covid-19 Vaccine Bivalent Booster 24yrs & up 04/10/2022   Moderna SARS-COV2 Booster Vaccination 09/27/2020, 03/30/2021   Moderna Sars-Covid-2 Vaccination 11/27/2019, 12/25/2019   PPD Test 04/22/2018, 06/02/2019   Pfizer Covid-19 Vaccine Bivalent Booster 49yrs & up 08/03/2021   Pneumococcal Conjugate-13 09/29/2011, 11/29/2014   Pneumococcal Polysaccharide-23 12/20/2017   Td 05/12/2008   Zoster, Live 05/12/2008   Pertinent  Health Maintenance Due  Topic Date Due   FOOT EXAM  06/08/2021    OPHTHALMOLOGY EXAM  02/08/2022   HEMOGLOBIN A1C  03/14/2023   INFLUENZA VACCINE  Completed      01/20/2022    8:27 PM 01/21/2022   10:00 AM 01/21/2022    7:30 PM 01/22/2022    2:00 PM 01/23/2022    1:00 AM  Fall Risk  Patient Fall Risk Level High fall risk High fall risk High fall risk High fall risk High fall risk   Functional Status Survey:    Vitals:   11/23/22 1251  BP: (!) 88/59  Pulse: (!) 105  Resp: 20  Temp: 97.7 F (36.5 C)  SpO2: 90%  Weight: 173 lb (78.5 kg)  Height: 5\' 8"  (1.727 m)   Body mass index is 26.3 kg/m. Physical Exam Cardiovascular:     Rate and Rhythm: Normal rate.     Pulses: Normal pulses.  Pulmonary:     Effort: Pulmonary effort is normal.     Comments: 2LNC, No breath sounds in RLL, LLL CTA Skin:    General: Skin is warm and dry.  Neurological:     Mental Status: He is alert. Mental status is at baseline.     Labs reviewed: Recent Labs    01/19/22 1808 01/20/22 0616 01/21/22 0406 09/13/22 0000  NA 137 142 143 132*  K 4.0 3.7 3.9 4.8  CL 105 113* 114* 96*  CO2 20* 24 24 20   GLUCOSE 361* 197* 178*  --   BUN 41* 32* 23 9  CREATININE 1.48* 1.24 1.00 1.1  CALCIUM 8.0* 8.0* 7.8* 9.6  MG  --  2.3  --   --    Recent Labs    01/19/22 1950 01/20/22 0616 09/13/22 0000  AST 50* 43* 16  ALT 25 24 8*  ALKPHOS 62 57 100  BILITOT 0.4 0.7  --   PROT 7.2 6.5  --   ALBUMIN 3.0* 2.9* 3.4*   Recent Labs    01/19/22 1808 01/20/22 0616 01/21/22 0406 01/22/22 0420 01/22/22 1144 01/23/22 0413 09/13/22 0000  WBC 10.7* 9.6 10.0  --   --   --  15.1  NEUTROABS 8.0* 6.9  --   --   --   --   --   HGB 6.9* 7.6* 7.0*   < >  8.8* 8.5* 14.2  HCT 24.5* 25.8* 23.7*   < > 29.8* 28.3* 42  MCV 86.6 85.1 84.9  --   --   --   --   PLT 376 347 295  --   --   --  504*   < > = values in this interval not displayed.   Lab Results  Component Value Date   TSH 0.747 04/27/2018   Lab Results  Component Value Date   HGBA1C 8.0 09/13/2022   Lab  Results  Component Value Date   CHOL 120 09/15/2021   HDL 48 09/15/2021   LDLCALC 48 09/15/2021   TRIG 154 09/15/2021   CHOLHDL 2 09/07/2020    Significant Diagnostic Results in last 30 days:  No results found.  Assessment/Plan DNR (do not resuscitate)/DNI(Do Not Intubate)  Hospice care  Hyponatremia  Hypotension, unspecified hypotension type  Tachycardia  Hypoxia Patient with acute hypoxia due to likely underlying pnuemonia. Labs are pending. Likely sepsis with pulmonary infection. Numerous allergies. Family would like to defer treatment at this time. If family decides to treat, can trial azithromycin and omnicef. Will continue supportive care. Patient is on hospice, DNH, and DNR.    Family/ staff Communication: Clydie Braun, nursing  Labs/tests ordered:  CMP and CBC pending  Due to patient's current instability would recommend transfer to hospital for higher level of care, however, this was declined.   Coralyn Helling, MD, [facility] [phone]

## 2022-11-25 ENCOUNTER — Telehealth: Payer: Medicare Other | Admitting: Family

## 2022-11-25 DIAGNOSIS — G309 Alzheimer's disease, unspecified: Secondary | ICD-10-CM | POA: Diagnosis not present

## 2022-11-25 DIAGNOSIS — E43 Unspecified severe protein-calorie malnutrition: Secondary | ICD-10-CM | POA: Diagnosis not present

## 2022-11-25 DIAGNOSIS — F0154 Vascular dementia, unspecified severity, with anxiety: Secondary | ICD-10-CM | POA: Diagnosis not present

## 2022-11-25 DIAGNOSIS — K2289 Other specified disease of esophagus: Secondary | ICD-10-CM | POA: Diagnosis not present

## 2022-11-25 DIAGNOSIS — F0284 Dementia in other diseases classified elsewhere, unspecified severity, with anxiety: Secondary | ICD-10-CM | POA: Diagnosis not present

## 2022-11-25 DIAGNOSIS — D631 Anemia in chronic kidney disease: Secondary | ICD-10-CM | POA: Diagnosis not present

## 2022-11-25 NOTE — Telephone Encounter (Signed)
[facility] Nurse called reports patient 's chest X-ray showed infiltrate on right lung.states Stephen [name] had ordered CBC lab work that has not resulted had plan to treat with antibiotics for possible aspiration Pneumonia.states Family would like to go ahead with antibiotics treatment for now states aware might help or not would like to give it a try.Levaquin ordered.

## 2022-11-26 ENCOUNTER — Non-Acute Institutional Stay (SKILLED_NURSING_FACILITY): Payer: Medicare Other | Admitting: Student

## 2022-11-26 ENCOUNTER — Encounter: Payer: Self-pay | Admitting: Student

## 2022-11-26 DIAGNOSIS — F0284 Dementia in other diseases classified elsewhere, unspecified severity, with anxiety: Secondary | ICD-10-CM | POA: Diagnosis not present

## 2022-11-26 DIAGNOSIS — Z515 Encounter for palliative care: Secondary | ICD-10-CM

## 2022-11-26 DIAGNOSIS — D631 Anemia in chronic kidney disease: Secondary | ICD-10-CM | POA: Diagnosis not present

## 2022-11-26 DIAGNOSIS — F0154 Vascular dementia, unspecified severity, with anxiety: Secondary | ICD-10-CM | POA: Diagnosis not present

## 2022-11-26 DIAGNOSIS — E43 Unspecified severe protein-calorie malnutrition: Secondary | ICD-10-CM | POA: Diagnosis not present

## 2022-11-26 DIAGNOSIS — K2289 Other specified disease of esophagus: Secondary | ICD-10-CM | POA: Diagnosis not present

## 2022-11-26 DIAGNOSIS — G309 Alzheimer's disease, unspecified: Secondary | ICD-10-CM | POA: Diagnosis not present

## 2022-11-26 MED ORDER — HYOSCYAMINE SULFATE SL 0.125 MG SL SUBL: 1.0000 | SUBLINGUAL_TABLET | Freq: Four times a day (QID) | SUBLINGUAL | Status: AC | PRN

## 2022-11-26 MED ORDER — LORAZEPAM 0.5 MG PO TABS: 0.5000 mg | ORAL_TABLET | Freq: Three times a day (TID) | ORAL | 0 refills | Status: AC | PRN

## 2022-11-26 MED ORDER — LEVOFLOXACIN 750 MG PO TABS: 750.0000 mg | ORAL_TABLET | Freq: Every day | ORAL | Status: AC

## 2022-11-26 MED ORDER — SENNA 8.6 MG PO TABS: 1.0000 | ORAL_TABLET | Freq: Every day | ORAL | 0 refills | Status: AC | PRN

## 2022-11-26 MED ORDER — ACETAMINOPHEN 650 MG RE SUPP: 650.0000 mg | RECTAL | 0 refills | Status: AC | PRN

## 2022-11-26 NOTE — Addendum Note (Signed)
Addended by: Earnestine Mealing on: 11/26/2022 03:51 PM   Modules accepted: Orders

## 2022-11-26 NOTE — Progress Notes (Addendum)
Location:  Other [facility].  Nursing Home Room Number: [facility] [address] Place of Service:  SNF [phone]) Provider:  Dr. Sherri Rad, MD  Patient Care Team: Earnestine Mealing, MD as PCP - General (Family Medicine) Iran Ouch, MD as PCP - Cardiology (Cardiology) [place] Ouch, MD as Consulting Physician (Cardiology)  Extended Emergency Contact Information Primary Emergency Contact: [facility] Address: [address]          [place], [place] [address] Darden Amber of [place] Home Phone: [phone] Mobile Phone: [phone] Relation: Significant other  Code Status:  DNR Goals of care: Advanced Directive information    11/26/2022   10:14 AM  Advanced Directives  Does Patient Have a Medical Advance Directive? Yes  Type of Advance Directive Living will;Out of facility DNR (pink MOST or yellow form)  Does patient want to make changes to medical advance directive? No - Patient declined     Chief Complaint  Patient presents with   Acute Visit    Hospice Care    HPI:  Pt is a 89 y.o. male Received message from nursing that patient is transitioning from a hospice standpoint. Hopsice nursing contiues to care for and manage patient. Unable to see patient for face to face encounter, however, will update chart for new medications required for comfort. Will discontinue chronic medication management.   Allergies  Allergen Reactions   Aricept [Donepezil Hcl] Diarrhea    severe   Belsomra [Suvorexant] Other (See Comments)    Anxiety. Did not work   Vancomycin Itching, Rash and Other (See Comments)    Burning pain    Ambien [Zolpidem Tartrate] Other (See Comments)    Gi problems   Atorvastatin     muscle aches   Doxycycline Rash   Penicillins Hives    Has patient had a PCN reaction causing immediate rash, facial/tongue/throat swelling, SOB or lightheadedness with hypotension: Yes Has patient had a PCN reaction causing severe rash involving  mucus membranes or skin necrosis: Yes Has patient had a PCN reaction that required hospitalization No Has patient had a PCN reaction occurring within the last 10 years: No If all of the above answers are "NO", then may proceed with Cephalosporin use.    Tamsulosin Other (See Comments)    Leg weakness.     Outpatient Encounter Medications as of 11/26/2022  Medication Sig   acetaminophen (TYLENOL) 650 MG CR tablet Take 650 mg by mouth 3 (three) times daily.   ARIPIPRAZOLE PO Take 1 mg by mouth 3 (three) times daily.   bisacodyl (DULCOLAX) 10 MG suppository Place 10 mg rectally daily as needed for moderate constipation.   clonazePAM (KLONOPIN) 1 MG tablet Take 1 tablet (1 mg total) by mouth at bedtime.   diclofenac Sodium (VOLTAREN) 1 % GEL Apply 2 g topically every 8 (eight) hours as needed (pain).   finasteride (PROSCAR) 5 MG tablet TAKE 1 TABLET BY MOUTH EVERY DAY   morphine 20 MG/5ML solution Take 1.3 mLs (5.2 mg total) by mouth every 4 (four) hours as needed for pain.   OXYGEN Every shift for SOB and decreased sats O2 at 2lpm for sats under 90%   polyethylene glycol (MIRALAX / GLYCOLAX) 17 g packet Take 8.5 g by mouth every other day.   [DISCONTINUED] clopidogrel (PLAVIX) 75 MG tablet TAKE 1 TABLET (75 MG TOTAL) BY MOUTH AT BEDTIME.   [DISCONTINUED] Ensure (ENSURE) Take 237 mLs by mouth daily.   [DISCONTINUED] ferrous sulfate 325 (65 FE) MG tablet  Take 1 tablet (325 mg total) by mouth daily with breakfast.   [DISCONTINUED] levofloxacin (LEVAQUIN) 750 MG tablet Take 750 mg by mouth daily.   [DISCONTINUED] metFORMIN (GLUCOPHAGE) 500 MG tablet Take 1 tablet (500 mg total) by mouth 2 (two) times daily.   [DISCONTINUED] Multiple Vitamin (MULTIVITAMIN WITH MINERALS) TABS tablet Take 1 tablet by mouth daily.   [DISCONTINUED] pantoprazole (PROTONIX) 40 MG tablet Take 1 tablet (40 mg total) by mouth 2 (two) times daily.   [DISCONTINUED] sodium fluoride (PREVIDENT 5000 PLUS) 1.1 % CREA dental cream  Place 1 application. onto teeth every evening.   [DISCONTINUED] vitamin B-12 (CYANOCOBALAMIN) 500 MCG tablet Take 500 mcg by mouth daily.   No facility-administered encounter medications on file as of 11/26/2022.    Review of Systems  Immunization History  Administered Date(s) Administered   Influenza Whole 09/05/2013   Influenza, High Dose Seasonal PF 07/27/2016, 08/20/2019, 08/31/2020   Influenza,inj,Quad PF,6+ Mos 09/12/2015, 08/01/2017, 07/25/2018   Influenza-Unspecified 08/12/2014, 08/28/2022   Moderna Covid-19 Vaccine Bivalent Booster 28yrs & up 04/10/2022, 09/21/2022   Moderna SARS-COV2 Booster Vaccination 09/27/2020, 03/30/2021   Moderna Sars-Covid-2 Vaccination 11/27/2019, 12/25/2019   PPD Test 04/22/2018, 06/02/2019   Pfizer Covid-19 Vaccine Bivalent Booster 54yrs & up 08/03/2021   Pneumococcal Conjugate-13 09/29/2011, 11/29/2014   Pneumococcal Polysaccharide-23 12/20/2017   Td 05/12/2008   Zoster, Live 05/12/2008   Pertinent  Health Maintenance Due  Topic Date Due   FOOT EXAM  06/08/2021   OPHTHALMOLOGY EXAM  02/08/2022   HEMOGLOBIN A1C  03/14/2023   INFLUENZA VACCINE  Completed      01/20/2022    8:27 PM 01/21/2022   10:00 AM 01/21/2022    7:30 PM 01/22/2022    2:00 PM 01/23/2022    1:00 AM  Fall Risk  (RETIRED) Patient Fall Risk Level High fall risk High fall risk High fall risk High fall risk High fall risk   Functional Status Survey:    Vitals:   11/26/22 0957  BP: (!) 91/52  Pulse: (!) 115  Resp: 20  Temp: (!) 97.5 F (36.4 C)  SpO2: 98%  Weight: 173 lb (78.5 kg)  Height: 5\' 8"  (1.727 m)   Body mass index is 26.3 kg/m. Physical Exam Deferred  Assessment/Plan 1. Hospice care Patient is having progression of hospice symptoms. Comfort medications ordered. Chronic care medications discontinued. Will continue levoquin for comfort support.    Family/ staff Communication: nursing  Labs/tests ordered:  none

## 2022-11-27 DIAGNOSIS — E43 Unspecified severe protein-calorie malnutrition: Secondary | ICD-10-CM | POA: Diagnosis not present

## 2022-11-27 DIAGNOSIS — D631 Anemia in chronic kidney disease: Secondary | ICD-10-CM | POA: Diagnosis not present

## 2022-11-27 DIAGNOSIS — F0284 Dementia in other diseases classified elsewhere, unspecified severity, with anxiety: Secondary | ICD-10-CM | POA: Diagnosis not present

## 2022-11-27 DIAGNOSIS — F0154 Vascular dementia, unspecified severity, with anxiety: Secondary | ICD-10-CM | POA: Diagnosis not present

## 2022-11-27 DIAGNOSIS — G309 Alzheimer's disease, unspecified: Secondary | ICD-10-CM | POA: Diagnosis not present

## 2022-11-27 DIAGNOSIS — K2289 Other specified disease of esophagus: Secondary | ICD-10-CM | POA: Diagnosis not present

## 2022-11-28 DIAGNOSIS — K2289 Other specified disease of esophagus: Secondary | ICD-10-CM | POA: Diagnosis not present

## 2022-11-28 DIAGNOSIS — F0284 Dementia in other diseases classified elsewhere, unspecified severity, with anxiety: Secondary | ICD-10-CM | POA: Diagnosis not present

## 2022-11-28 DIAGNOSIS — E43 Unspecified severe protein-calorie malnutrition: Secondary | ICD-10-CM | POA: Diagnosis not present

## 2022-11-28 DIAGNOSIS — G309 Alzheimer's disease, unspecified: Secondary | ICD-10-CM | POA: Diagnosis not present

## 2022-11-28 DIAGNOSIS — D631 Anemia in chronic kidney disease: Secondary | ICD-10-CM | POA: Diagnosis not present

## 2022-11-28 DIAGNOSIS — F0154 Vascular dementia, unspecified severity, with anxiety: Secondary | ICD-10-CM | POA: Diagnosis not present

## 2022-11-29 DIAGNOSIS — D631 Anemia in chronic kidney disease: Secondary | ICD-10-CM | POA: Diagnosis not present

## 2022-11-29 DIAGNOSIS — E43 Unspecified severe protein-calorie malnutrition: Secondary | ICD-10-CM | POA: Diagnosis not present

## 2022-11-29 DIAGNOSIS — G309 Alzheimer's disease, unspecified: Secondary | ICD-10-CM | POA: Diagnosis not present

## 2022-11-29 DIAGNOSIS — K2289 Other specified disease of esophagus: Secondary | ICD-10-CM | POA: Diagnosis not present

## 2022-11-29 DIAGNOSIS — F0284 Dementia in other diseases classified elsewhere, unspecified severity, with anxiety: Secondary | ICD-10-CM | POA: Diagnosis not present

## 2022-11-29 DIAGNOSIS — F0154 Vascular dementia, unspecified severity, with anxiety: Secondary | ICD-10-CM | POA: Diagnosis not present

## 2022-12-13 DEATH — deceased
